# Patient Record
Sex: Female | Born: 1935 | Race: Black or African American | Hispanic: No | Smoking: Never smoker
Health system: Southern US, Community
[De-identification: ages and names within clinical notes are randomized; demographics above are authoritative.]

## PROBLEM LIST (undated history)

## (undated) DIAGNOSIS — E785 Hyperlipidemia, unspecified: Secondary | ICD-10-CM

## (undated) DIAGNOSIS — Z8639 Personal history of other endocrine, nutritional and metabolic disease: Secondary | ICD-10-CM

## (undated) DIAGNOSIS — Z803 Family history of malignant neoplasm of breast: Secondary | ICD-10-CM

## (undated) DIAGNOSIS — F329 Major depressive disorder, single episode, unspecified: Secondary | ICD-10-CM

## (undated) DIAGNOSIS — M810 Age-related osteoporosis without current pathological fracture: Secondary | ICD-10-CM

## (undated) DIAGNOSIS — N390 Urinary tract infection, site not specified: Secondary | ICD-10-CM

## (undated) DIAGNOSIS — IMO0002 Reserved for concepts with insufficient information to code with codable children: Secondary | ICD-10-CM

## (undated) DIAGNOSIS — M1990 Unspecified osteoarthritis, unspecified site: Secondary | ICD-10-CM

## (undated) DIAGNOSIS — R51 Headache: Secondary | ICD-10-CM

## (undated) DIAGNOSIS — K5790 Diverticulosis of intestine, part unspecified, without perforation or abscess without bleeding: Secondary | ICD-10-CM

## (undated) DIAGNOSIS — Z923 Personal history of irradiation: Secondary | ICD-10-CM

## (undated) DIAGNOSIS — F419 Anxiety disorder, unspecified: Secondary | ICD-10-CM

## (undated) DIAGNOSIS — I10 Essential (primary) hypertension: Secondary | ICD-10-CM

## (undated) DIAGNOSIS — G8929 Other chronic pain: Secondary | ICD-10-CM

## (undated) DIAGNOSIS — N8189 Other female genital prolapse: Secondary | ICD-10-CM

## (undated) DIAGNOSIS — Z8042 Family history of malignant neoplasm of prostate: Secondary | ICD-10-CM

## (undated) DIAGNOSIS — C50919 Malignant neoplasm of unspecified site of unspecified female breast: Secondary | ICD-10-CM

## (undated) DIAGNOSIS — Z800 Family history of malignant neoplasm of digestive organs: Secondary | ICD-10-CM

## (undated) DIAGNOSIS — R519 Headache, unspecified: Secondary | ICD-10-CM

## (undated) DIAGNOSIS — K219 Gastro-esophageal reflux disease without esophagitis: Secondary | ICD-10-CM

## (undated) DIAGNOSIS — F32A Depression, unspecified: Secondary | ICD-10-CM

## (undated) HISTORY — DX: Malignant neoplasm of unspecified site of unspecified female breast: C50.919

## (undated) HISTORY — DX: Major depressive disorder, single episode, unspecified: F32.9

## (undated) HISTORY — DX: Reserved for concepts with insufficient information to code with codable children: IMO0002

## (undated) HISTORY — PX: NECK SURGERY: SHX720

## (undated) HISTORY — DX: Other chronic pain: G89.29

## (undated) HISTORY — DX: Personal history of other endocrine, nutritional and metabolic disease: Z86.39

## (undated) HISTORY — DX: Urinary tract infection, site not specified: N39.0

## (undated) HISTORY — DX: Essential (primary) hypertension: I10

## (undated) HISTORY — DX: Hyperlipidemia, unspecified: E78.5

## (undated) HISTORY — DX: Age-related osteoporosis without current pathological fracture: M81.0

## (undated) HISTORY — DX: Anxiety disorder, unspecified: F41.9

## (undated) HISTORY — DX: Family history of malignant neoplasm of breast: Z80.3

## (undated) HISTORY — DX: Other female genital prolapse: N81.89

## (undated) HISTORY — DX: Unspecified osteoarthritis, unspecified site: M19.90

## (undated) HISTORY — DX: Family history of malignant neoplasm of prostate: Z80.42

## (undated) HISTORY — DX: Headache, unspecified: R51.9

## (undated) HISTORY — PX: BREAST LUMPECTOMY: SHX2

## (undated) HISTORY — DX: Headache: R51

## (undated) HISTORY — DX: Gastro-esophageal reflux disease without esophagitis: K21.9

## (undated) HISTORY — DX: Depression, unspecified: F32.A

## (undated) HISTORY — DX: Family history of malignant neoplasm of digestive organs: Z80.0

## (undated) HISTORY — DX: Diverticulosis of intestine, part unspecified, without perforation or abscess without bleeding: K57.90

---
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
## 1968-09-10 HISTORY — PX: APPENDECTOMY: SHX54

## 1996-09-10 HISTORY — PX: ABDOMINAL HYSTERECTOMY: SHX81

## 1998-02-22 ENCOUNTER — Other Ambulatory Visit: Admission: RE | Admit: 1998-02-22 | Discharge: 1998-02-22 | Payer: Self-pay | Admitting: Obstetrics and Gynecology

## 1998-02-28 ENCOUNTER — Ambulatory Visit (HOSPITAL_COMMUNITY): Admission: RE | Admit: 1998-02-28 | Discharge: 1998-02-28 | Payer: Self-pay | Admitting: Internal Medicine

## 1999-02-14 ENCOUNTER — Other Ambulatory Visit: Admission: RE | Admit: 1999-02-14 | Discharge: 1999-02-14 | Payer: Self-pay | Admitting: Obstetrics and Gynecology

## 2000-04-25 ENCOUNTER — Other Ambulatory Visit: Admission: RE | Admit: 2000-04-25 | Discharge: 2000-04-25 | Payer: Self-pay | Admitting: Obstetrics and Gynecology

## 2001-04-16 ENCOUNTER — Emergency Department (HOSPITAL_COMMUNITY): Admission: EM | Admit: 2001-04-16 | Discharge: 2001-04-16 | Payer: Self-pay | Admitting: Emergency Medicine

## 2001-04-16 ENCOUNTER — Encounter: Payer: Self-pay | Admitting: Emergency Medicine

## 2001-05-12 ENCOUNTER — Emergency Department (HOSPITAL_COMMUNITY): Admission: EM | Admit: 2001-05-12 | Discharge: 2001-05-13 | Payer: Self-pay | Admitting: Emergency Medicine

## 2001-05-13 ENCOUNTER — Encounter: Payer: Self-pay | Admitting: Emergency Medicine

## 2001-05-21 ENCOUNTER — Other Ambulatory Visit: Admission: RE | Admit: 2001-05-21 | Discharge: 2001-05-21 | Payer: Self-pay | Admitting: Obstetrics and Gynecology

## 2001-06-26 ENCOUNTER — Ambulatory Visit (HOSPITAL_COMMUNITY): Admission: RE | Admit: 2001-06-26 | Discharge: 2001-06-26 | Payer: Self-pay | Admitting: Gastroenterology

## 2002-03-04 ENCOUNTER — Other Ambulatory Visit: Admission: RE | Admit: 2002-03-04 | Discharge: 2002-03-04 | Payer: Self-pay | Admitting: Obstetrics and Gynecology

## 2002-05-19 ENCOUNTER — Encounter: Admission: RE | Admit: 2002-05-19 | Discharge: 2002-08-17 | Payer: Self-pay | Admitting: Internal Medicine

## 2002-11-09 ENCOUNTER — Other Ambulatory Visit: Admission: RE | Admit: 2002-11-09 | Discharge: 2002-11-09 | Payer: Self-pay | Admitting: Obstetrics and Gynecology

## 2005-10-19 ENCOUNTER — Encounter: Admission: RE | Admit: 2005-10-19 | Discharge: 2005-10-19 | Payer: Self-pay | Admitting: Orthopedic Surgery

## 2005-11-16 ENCOUNTER — Encounter: Admission: RE | Admit: 2005-11-16 | Discharge: 2005-11-16 | Payer: Self-pay | Admitting: Orthopedic Surgery

## 2007-09-11 DIAGNOSIS — Z923 Personal history of irradiation: Secondary | ICD-10-CM

## 2007-09-11 HISTORY — PX: BREAST SURGERY: SHX581

## 2007-09-11 HISTORY — DX: Personal history of irradiation: Z92.3

## 2007-09-11 HISTORY — PX: BREAST LUMPECTOMY: SHX2

## 2008-03-03 ENCOUNTER — Encounter: Payer: Self-pay | Admitting: Family Medicine

## 2008-03-31 ENCOUNTER — Observation Stay (HOSPITAL_COMMUNITY): Admission: EM | Admit: 2008-03-31 | Discharge: 2008-04-01 | Payer: Self-pay | Admitting: Emergency Medicine

## 2008-04-01 ENCOUNTER — Ambulatory Visit: Payer: Self-pay | Admitting: Surgery

## 2008-04-13 ENCOUNTER — Ambulatory Visit: Payer: Self-pay | Admitting: Family Medicine

## 2008-04-13 DIAGNOSIS — I10 Essential (primary) hypertension: Secondary | ICD-10-CM

## 2008-04-13 DIAGNOSIS — M1990 Unspecified osteoarthritis, unspecified site: Secondary | ICD-10-CM

## 2008-04-13 DIAGNOSIS — M069 Rheumatoid arthritis, unspecified: Secondary | ICD-10-CM | POA: Insufficient documentation

## 2008-04-13 LAB — CONVERTED CEMR LAB
Blood in Urine, dipstick: NEGATIVE
Glucose, Urine, Semiquant: NEGATIVE
Protein, U semiquant: NEGATIVE
Specific Gravity, Urine: <1.005

## 2008-04-14 ENCOUNTER — Encounter (INDEPENDENT_AMBULATORY_CARE_PROVIDER_SITE_OTHER): Payer: Self-pay | Admitting: Radiology

## 2008-04-14 LAB — CONVERTED CEMR LAB
Albumin: 3.8 g/dL (ref 3.5–5.2)
Alkaline Phosphatase: 75 units/L (ref 39–117)
Basophils Relative: 0.9 % (ref 0.0–3.0)
Bilirubin, Direct: 0.1 mg/dL (ref 0.0–0.3)
Chloride: 101 meq/L (ref 96–112)
Cholesterol: 197 mg/dL (ref 0–200)
Eosinophils Relative: 2.1 % (ref 0.0–5.0)
GFR calc non Af Amer: 65 mL/min
Hemoglobin: 13.3 g/dL (ref 12.0–15.0)
Lymphocytes Relative: 26.8 % (ref 12.0–46.0)
MCHC: 33.4 g/dL (ref 30.0–36.0)
Monocytes Relative: 9.1 % (ref 3.0–12.0)
Neutrophils Relative %: 61.1 % (ref 43.0–77.0)
Platelets: 238 10*3/uL (ref 150–400)
RDW: 13.9 % (ref 11.5–14.6)
Sodium: 139 meq/L (ref 135–145)
TSH: 0.83 microintl units/mL (ref 0.35–5.50)
Total Bilirubin: 1 mg/dL (ref 0.3–1.2)
Total Protein: 7.3 g/dL (ref 6.0–8.3)
Triglycerides: 116 mg/dL (ref 0–149)
VLDL: 23 mg/dL (ref 0–40)
WBC: 8.7 10*3/uL (ref 4.5–10.5)

## 2008-04-15 ENCOUNTER — Encounter (INDEPENDENT_AMBULATORY_CARE_PROVIDER_SITE_OTHER): Payer: Self-pay | Admitting: *Deleted

## 2008-04-15 ENCOUNTER — Telehealth (INDEPENDENT_AMBULATORY_CARE_PROVIDER_SITE_OTHER): Payer: Self-pay | Admitting: *Deleted

## 2008-04-22 ENCOUNTER — Telehealth: Payer: Self-pay | Admitting: Family Medicine

## 2008-04-22 DIAGNOSIS — C50919 Malignant neoplasm of unspecified site of unspecified female breast: Secondary | ICD-10-CM

## 2008-05-11 ENCOUNTER — Ambulatory Visit: Payer: Self-pay | Admitting: Family Medicine

## 2008-05-12 ENCOUNTER — Encounter: Admission: RE | Admit: 2008-05-12 | Discharge: 2008-05-12 | Payer: Self-pay | Admitting: General Surgery

## 2008-05-18 ENCOUNTER — Encounter (INDEPENDENT_AMBULATORY_CARE_PROVIDER_SITE_OTHER): Payer: Self-pay | Admitting: *Deleted

## 2008-05-24 ENCOUNTER — Encounter (HOSPITAL_BASED_OUTPATIENT_CLINIC_OR_DEPARTMENT_OTHER): Payer: Self-pay | Admitting: General Surgery

## 2008-05-24 ENCOUNTER — Ambulatory Visit (HOSPITAL_BASED_OUTPATIENT_CLINIC_OR_DEPARTMENT_OTHER): Admission: RE | Admit: 2008-05-24 | Discharge: 2008-05-24 | Payer: Self-pay | Admitting: General Surgery

## 2008-06-09 ENCOUNTER — Ambulatory Visit: Payer: Self-pay | Admitting: Oncology

## 2008-06-22 ENCOUNTER — Ambulatory Visit: Admission: RE | Admit: 2008-06-22 | Discharge: 2008-09-09 | Payer: Self-pay | Admitting: Radiation Oncology

## 2008-06-23 ENCOUNTER — Encounter: Payer: Self-pay | Admitting: Family Medicine

## 2008-06-23 LAB — COMPREHENSIVE METABOLIC PANEL
BUN: 13 mg/dL (ref 6–23)
CO2: 25 mEq/L (ref 19–32)
Calcium: 9.9 mg/dL (ref 8.4–10.5)
Chloride: 104 mEq/L (ref 96–112)
Creatinine, Ser: 0.87 mg/dL (ref 0.40–1.20)
Glucose, Bld: 98 mg/dL (ref 70–99)

## 2008-06-23 LAB — CBC WITH DIFFERENTIAL/PLATELET
Basophils Absolute: 0 10*3/uL (ref 0.0–0.1)
HCT: 39.4 % (ref 34.8–46.6)
HGB: 13 g/dL (ref 11.6–15.9)
MONO#: 0.7 10*3/uL (ref 0.1–0.9)
NEUT%: 64.3 % (ref 39.6–76.8)
Platelets: 197 10*3/uL (ref 145–400)
WBC: 8.2 10*3/uL (ref 3.9–10.0)
lymph#: 2.1 10*3/uL (ref 0.9–3.3)

## 2008-08-10 ENCOUNTER — Ambulatory Visit: Payer: Self-pay | Admitting: Oncology

## 2008-08-10 DIAGNOSIS — C50919 Malignant neoplasm of unspecified site of unspecified female breast: Secondary | ICD-10-CM

## 2008-08-10 HISTORY — DX: Malignant neoplasm of unspecified site of unspecified female breast: C50.919

## 2008-08-12 LAB — CBC WITH DIFFERENTIAL/PLATELET
BASO%: 0.9 % (ref 0.0–2.0)
Eosinophils Absolute: 0.2 10*3/uL (ref 0.0–0.5)
HCT: 39.5 % (ref 34.8–46.6)
MCHC: 33.3 g/dL (ref 32.0–36.0)
MONO#: 0.5 10*3/uL (ref 0.1–0.9)
NEUT#: 4.2 10*3/uL (ref 1.5–6.5)
NEUT%: 67.4 % (ref 39.6–76.8)
RBC: 4.46 10*6/uL (ref 3.70–5.32)
WBC: 6.3 10*3/uL (ref 3.9–10.0)
lymph#: 1.3 10*3/uL (ref 0.9–3.3)

## 2008-08-16 ENCOUNTER — Telehealth (INDEPENDENT_AMBULATORY_CARE_PROVIDER_SITE_OTHER): Payer: Self-pay | Admitting: *Deleted

## 2008-09-23 ENCOUNTER — Encounter: Payer: Self-pay | Admitting: Family Medicine

## 2008-11-08 ENCOUNTER — Ambulatory Visit: Payer: Self-pay | Admitting: Family Medicine

## 2008-11-08 DIAGNOSIS — E785 Hyperlipidemia, unspecified: Secondary | ICD-10-CM | POA: Insufficient documentation

## 2008-11-09 LAB — CONVERTED CEMR LAB
AST: 23 units/L (ref 0–37)
Alkaline Phosphatase: 64 units/L (ref 39–117)
BUN: 13 mg/dL (ref 6–23)
CO2: 30 meq/L (ref 19–32)
Chloride: 106 meq/L (ref 96–112)
Direct LDL: 142.5 mg/dL
GFR calc Af Amer: 78 mL/min
GFR calc non Af Amer: 64 mL/min
HDL: 52.9 mg/dL (ref >39.0)
Sodium: 142 meq/L (ref 135–145)
Total Bilirubin: 0.8 mg/dL (ref 0.3–1.2)
Vit D, 25-Hydroxy: 56 ng/mL (ref 30–89)

## 2008-11-10 ENCOUNTER — Encounter (INDEPENDENT_AMBULATORY_CARE_PROVIDER_SITE_OTHER): Payer: Self-pay | Admitting: *Deleted

## 2008-11-11 ENCOUNTER — Encounter: Admission: RE | Admit: 2008-11-11 | Discharge: 2008-11-11 | Payer: Self-pay | Admitting: General Surgery

## 2008-12-21 ENCOUNTER — Ambulatory Visit (HOSPITAL_BASED_OUTPATIENT_CLINIC_OR_DEPARTMENT_OTHER): Admission: RE | Admit: 2008-12-21 | Discharge: 2008-12-21 | Payer: Self-pay | Admitting: Family Medicine

## 2008-12-21 ENCOUNTER — Ambulatory Visit: Payer: Self-pay | Admitting: Diagnostic Radiology

## 2008-12-27 ENCOUNTER — Ambulatory Visit (HOSPITAL_COMMUNITY): Admission: RE | Admit: 2008-12-27 | Discharge: 2008-12-27 | Payer: Self-pay | Admitting: Orthopedic Surgery

## 2009-01-03 ENCOUNTER — Ambulatory Visit: Payer: Self-pay | Admitting: Oncology

## 2009-01-05 LAB — CBC WITH DIFFERENTIAL/PLATELET
BASO%: 0.5 % (ref 0.0–2.0)
EOS%: 1.6 % (ref 0.0–7.0)
LYMPH%: 27.6 % (ref 14.0–49.7)
MCH: 30.5 pg (ref 25.1–34.0)
MCHC: 33.3 g/dL (ref 31.5–36.0)
MCV: 91.7 fL (ref 79.5–101.0)
MONO%: 8.8 % (ref 0.0–14.0)
Platelets: 196 10*3/uL (ref 145–400)
RBC: 4.28 10*6/uL (ref 3.70–5.45)

## 2009-01-05 LAB — COMPREHENSIVE METABOLIC PANEL
ALT: 14 U/L (ref 0–35)
Alkaline Phosphatase: 77 U/L (ref 39–117)
Glucose, Bld: 97 mg/dL (ref 70–99)
Sodium: 139 mEq/L (ref 135–145)
Total Bilirubin: 0.6 mg/dL (ref 0.3–1.2)
Total Protein: 7.1 g/dL (ref 6.0–8.3)

## 2009-01-06 ENCOUNTER — Encounter: Admission: RE | Admit: 2009-01-06 | Discharge: 2009-01-06 | Payer: Self-pay | Admitting: General Surgery

## 2009-02-09 ENCOUNTER — Ambulatory Visit (HOSPITAL_COMMUNITY): Admission: RE | Admit: 2009-02-09 | Discharge: 2009-02-09 | Payer: Self-pay | Admitting: Obstetrics and Gynecology

## 2009-04-01 ENCOUNTER — Ambulatory Visit: Payer: Self-pay | Admitting: Oncology

## 2009-04-19 LAB — COMPREHENSIVE METABOLIC PANEL
ALT: 17 U/L (ref 0–35)
Alkaline Phosphatase: 45 U/L (ref 39–117)
Sodium: 136 mEq/L (ref 135–145)
Total Bilirubin: 0.7 mg/dL (ref 0.3–1.2)
Total Protein: 6.7 g/dL (ref 6.0–8.3)

## 2009-04-19 LAB — CBC WITH DIFFERENTIAL/PLATELET
BASO%: 0.4 % (ref 0.0–2.0)
LYMPH%: 26.8 % (ref 14.0–49.7)
MCHC: 33.7 g/dL (ref 31.5–36.0)
MCV: 93.6 fL (ref 79.5–101.0)
MONO#: 0.6 10*3/uL (ref 0.1–0.9)
MONO%: 10.5 % (ref 0.0–14.0)
Platelets: 173 10*3/uL (ref 145–400)
RBC: 3.94 10*6/uL (ref 3.70–5.45)
RDW: 15 % — ABNORMAL HIGH (ref 11.2–14.5)
WBC: 5.4 10*3/uL (ref 3.9–10.3)

## 2009-05-05 ENCOUNTER — Ambulatory Visit: Payer: Self-pay | Admitting: Family Medicine

## 2009-05-05 LAB — CONVERTED CEMR LAB
Glucose, Urine, Semiquant: NEGATIVE
Ketones, urine, test strip: NEGATIVE
Urobilinogen, UA: NEGATIVE
pH: 7.5

## 2009-05-07 LAB — CONVERTED CEMR LAB
ALT: 18 units/L (ref 0–35)
Albumin: 3.8 g/dL (ref 3.5–5.2)
Alkaline Phosphatase: 45 units/L (ref 39–117)
Basophils Absolute: 0 10*3/uL (ref 0.0–0.1)
Basophils Relative: 0 % (ref 0.0–3.0)
Bilirubin, Direct: 0 mg/dL (ref 0.0–0.3)
Calcium: 9.2 mg/dL (ref 8.4–10.5)
Cholesterol: 185 mg/dL (ref 0–200)
Creatinine, Ser: 0.9 mg/dL (ref 0.4–1.2)
Folate: >20 ng/mL
HCT: 39 % (ref 36.0–46.0)
Hemoglobin: 13.2 g/dL (ref 12.0–15.0)
LDL Cholesterol: 125 mg/dL — ABNORMAL HIGH (ref 0–99)
Lymphocytes Relative: 29.6 % (ref 12.0–46.0)
Lymphs Abs: 1.9 10*3/uL (ref 0.7–4.0)
Neutro Abs: 3.9 10*3/uL (ref 1.4–7.7)
Platelets: 177 10*3/uL (ref 150.0–400.0)
Potassium: 3.7 meq/L (ref 3.5–5.1)
RBC: 4.1 M/uL (ref 3.87–5.11)
Sodium: 140 meq/L (ref 135–145)
TSH: 1.2 microintl units/mL (ref 0.35–5.50)
Total Bilirubin: 0.9 mg/dL (ref 0.3–1.2)
Total Protein: 7.4 g/dL (ref 6.0–8.3)
Triglycerides: 75 mg/dL (ref 0.0–149.0)
VLDL: 15 mg/dL (ref 0.0–40.0)
Vitamin B-12: 822 pg/mL (ref 211–911)

## 2009-05-10 ENCOUNTER — Encounter (INDEPENDENT_AMBULATORY_CARE_PROVIDER_SITE_OTHER): Payer: Self-pay | Admitting: *Deleted

## 2009-06-21 ENCOUNTER — Ambulatory Visit: Payer: Self-pay | Admitting: Oncology

## 2009-07-01 ENCOUNTER — Encounter: Payer: Self-pay | Admitting: Family Medicine

## 2009-07-01 LAB — CBC WITH DIFFERENTIAL/PLATELET
Basophils Absolute: 0 10*3/uL (ref 0.0–0.1)
EOS%: 3 % (ref 0.0–7.0)
HCT: 38.2 % (ref 34.8–46.6)
HGB: 12.5 g/dL (ref 11.6–15.9)
MCH: 30 pg (ref 25.1–34.0)
MCV: 91.6 fL (ref 79.5–101.0)
MONO%: 6.8 % (ref 0.0–14.0)
NEUT%: 57.1 % (ref 38.4–76.8)
Platelets: 174 10*3/uL (ref 145–400)

## 2009-07-01 LAB — COMPREHENSIVE METABOLIC PANEL
ALT: 12 U/L (ref 0–35)
Albumin: 3.8 g/dL (ref 3.5–5.2)
BUN: 10 mg/dL (ref 6–23)
Glucose, Bld: 64 mg/dL — ABNORMAL LOW (ref 70–99)
Total Bilirubin: 0.6 mg/dL (ref 0.3–1.2)
Total Protein: 7 g/dL (ref 6.0–8.3)

## 2009-07-07 ENCOUNTER — Encounter: Admission: RE | Admit: 2009-07-07 | Discharge: 2009-07-07 | Payer: Self-pay | Admitting: Oncology

## 2009-08-10 ENCOUNTER — Ambulatory Visit: Payer: Self-pay | Admitting: Family Medicine

## 2009-08-24 IMAGING — CR DG CHEST 2V
2 series · 2 of 2 positions shown · non-contrast
Comparison: Chest x-ray of 10/19/2005

CLINICAL DATA: Shoulder pain, hypertension

CHEST - 2 VIEW

[w chest pa]
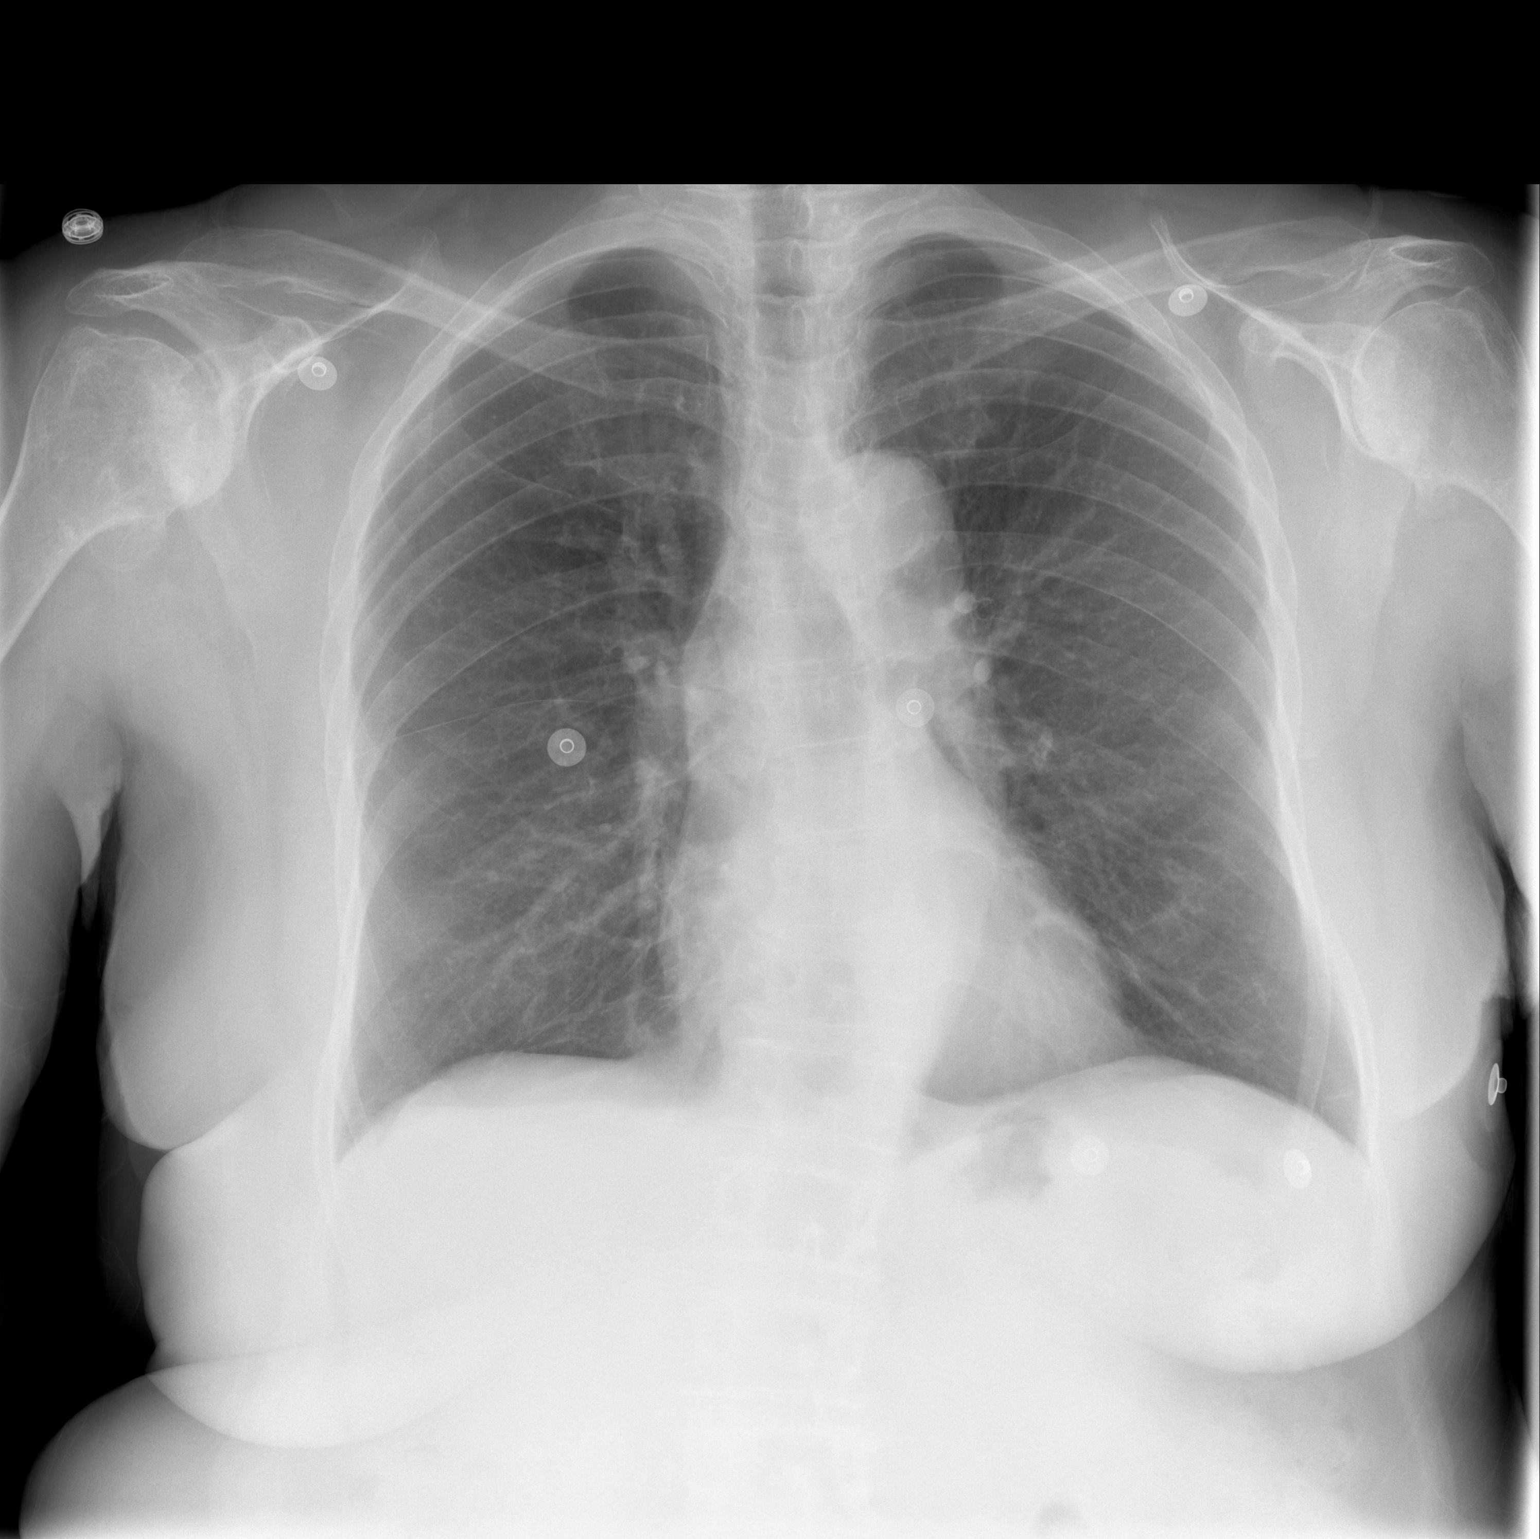

[w chest lat]
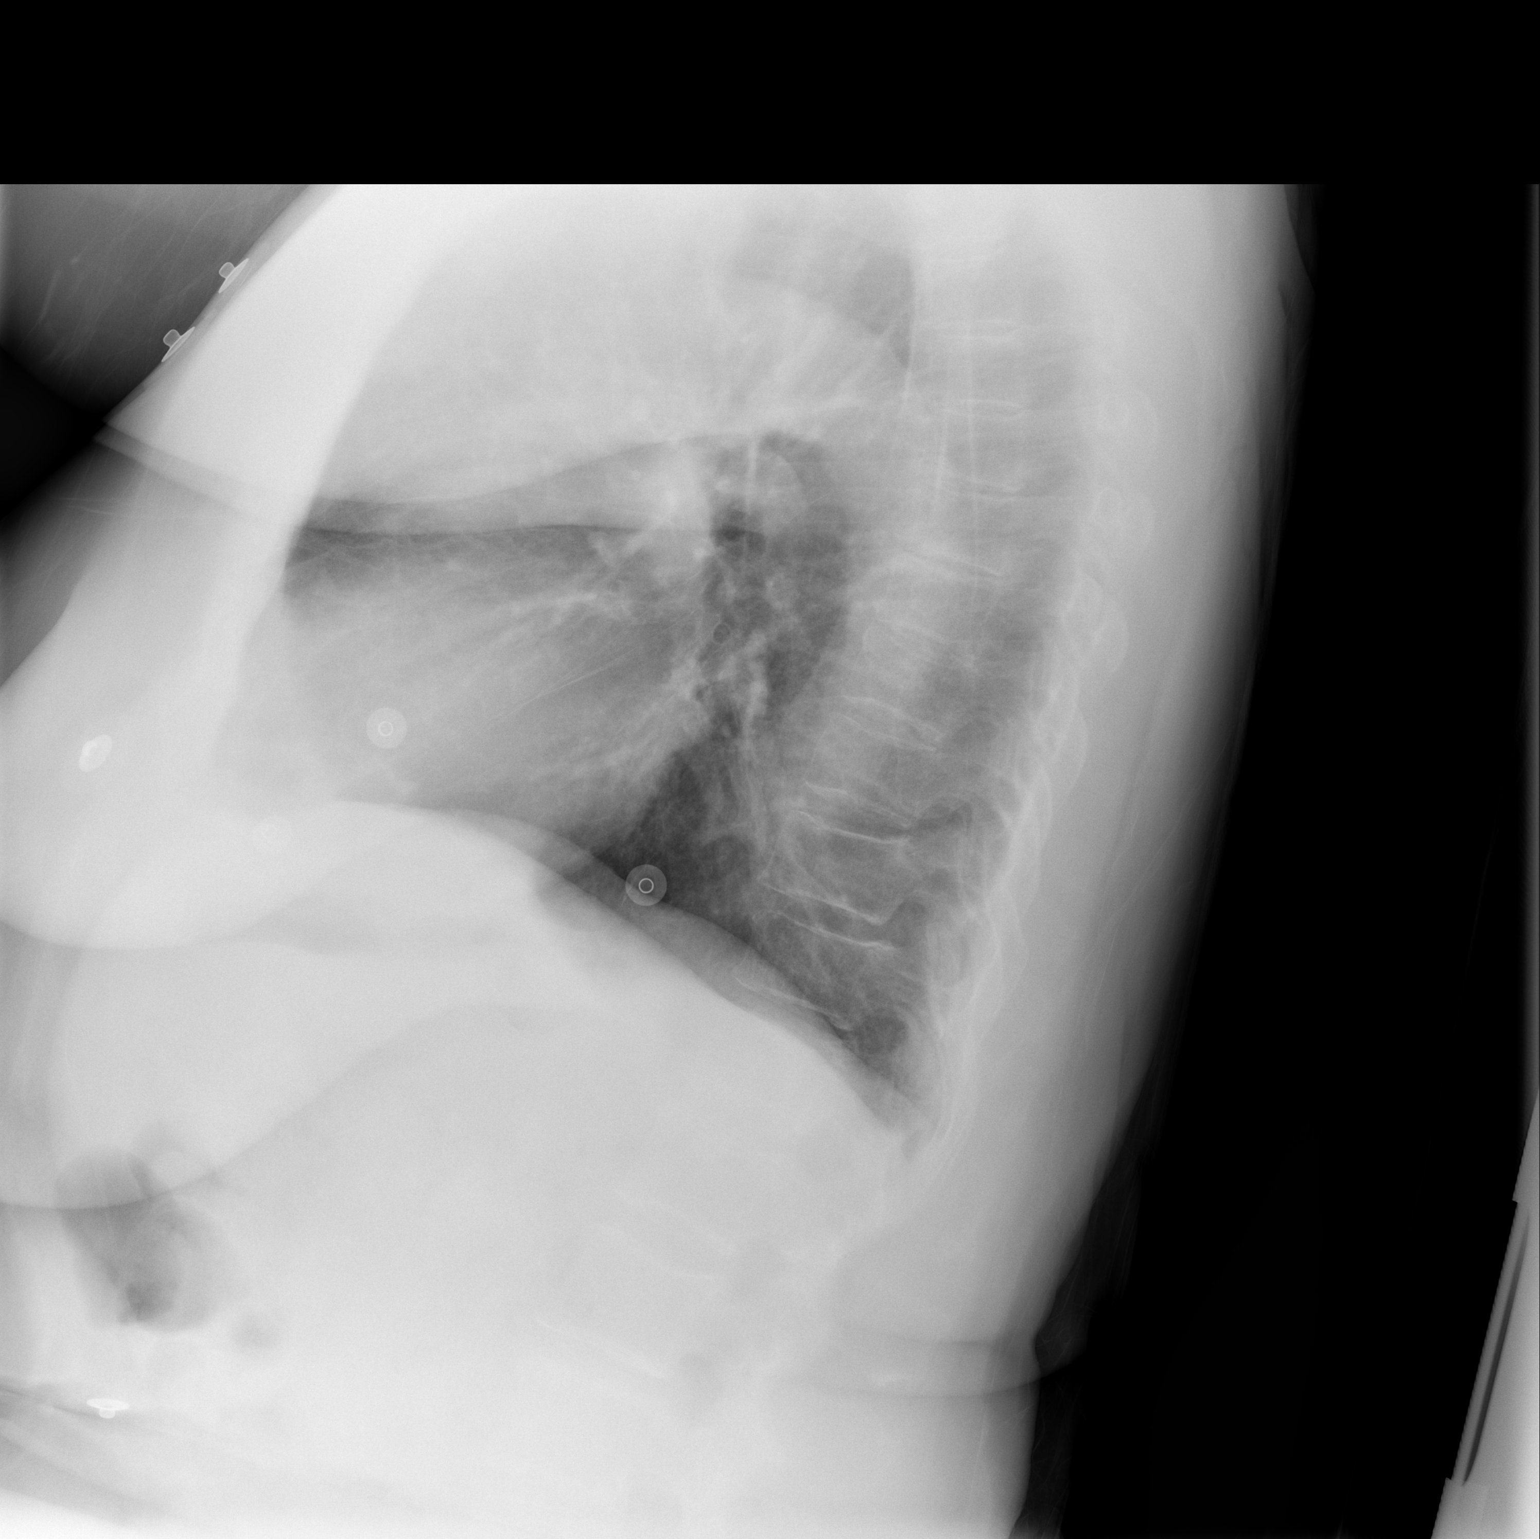

[2 of 2 positions shown; findings below may reference images not displayed]

FINDINGS: The lungs are clear.  Heart is within normal limits in
size.  There is moderate bilateral glenohumeral degenerative joint
disease present.  No acute bony abnormality is seen.
IMPRESSION: No active lung disease.  Bilateral degenerative joint disease
involves both shoulders.

## 2009-08-24 IMAGING — CR DG SHOULDER 2+V*R*
4 series · 4 of 4 positions shown · non-contrast
Comparison: Chest radiograph 10/19/2005 and chest CT 10/19/2005.

CLINICAL DATA: Shoulder pain

RIGHT SHOULDER - 2+ VIEW

[w shoulder ap internal righ]
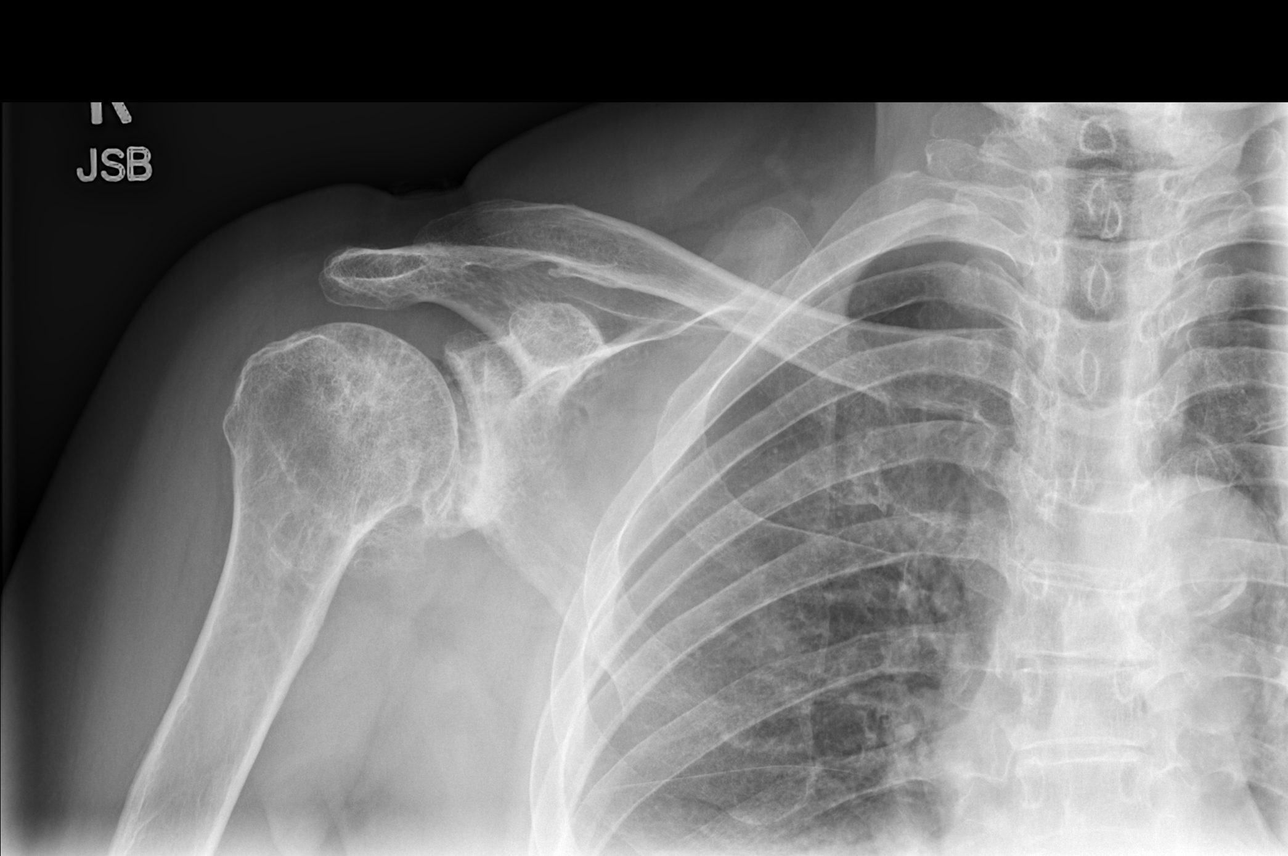

[w shoulder ap external righ]
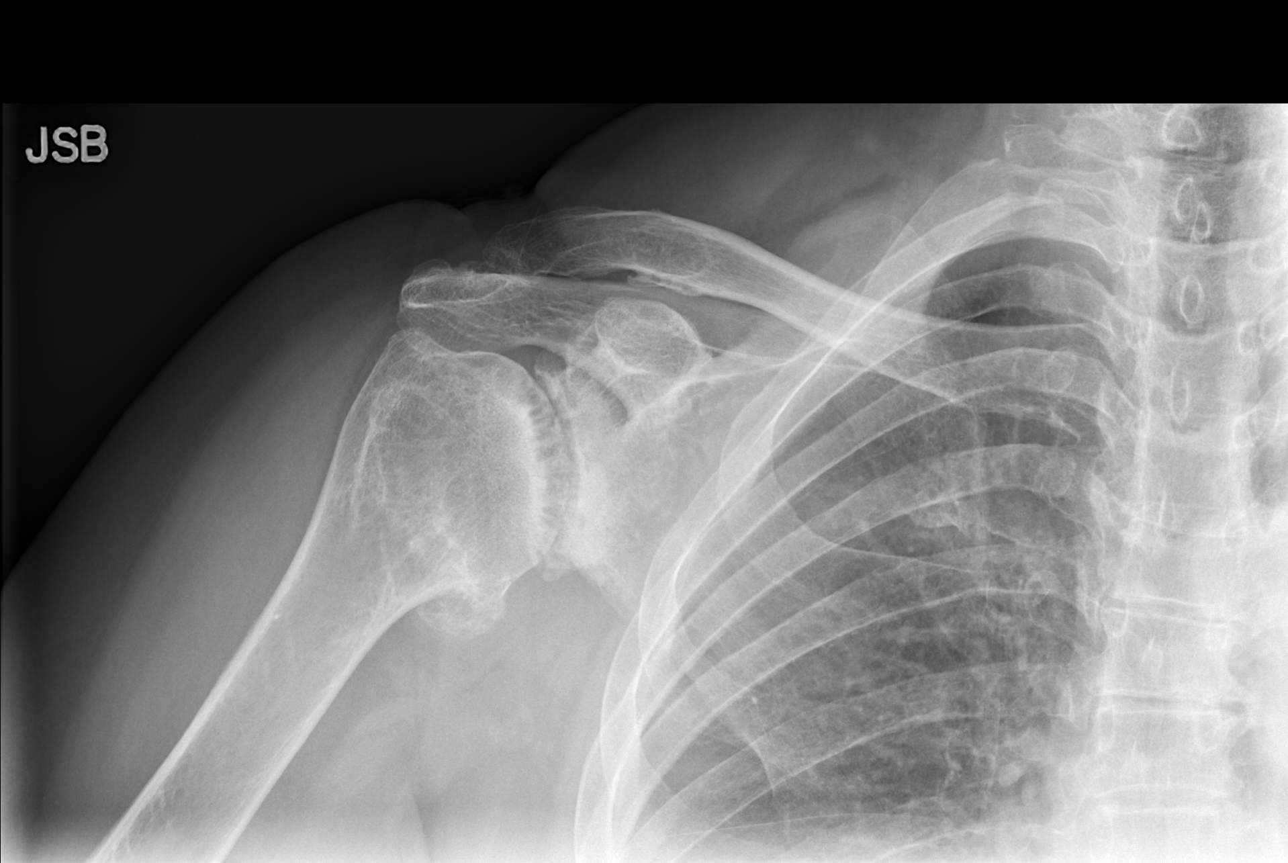

[w shoulder y view right (1 of 2)]
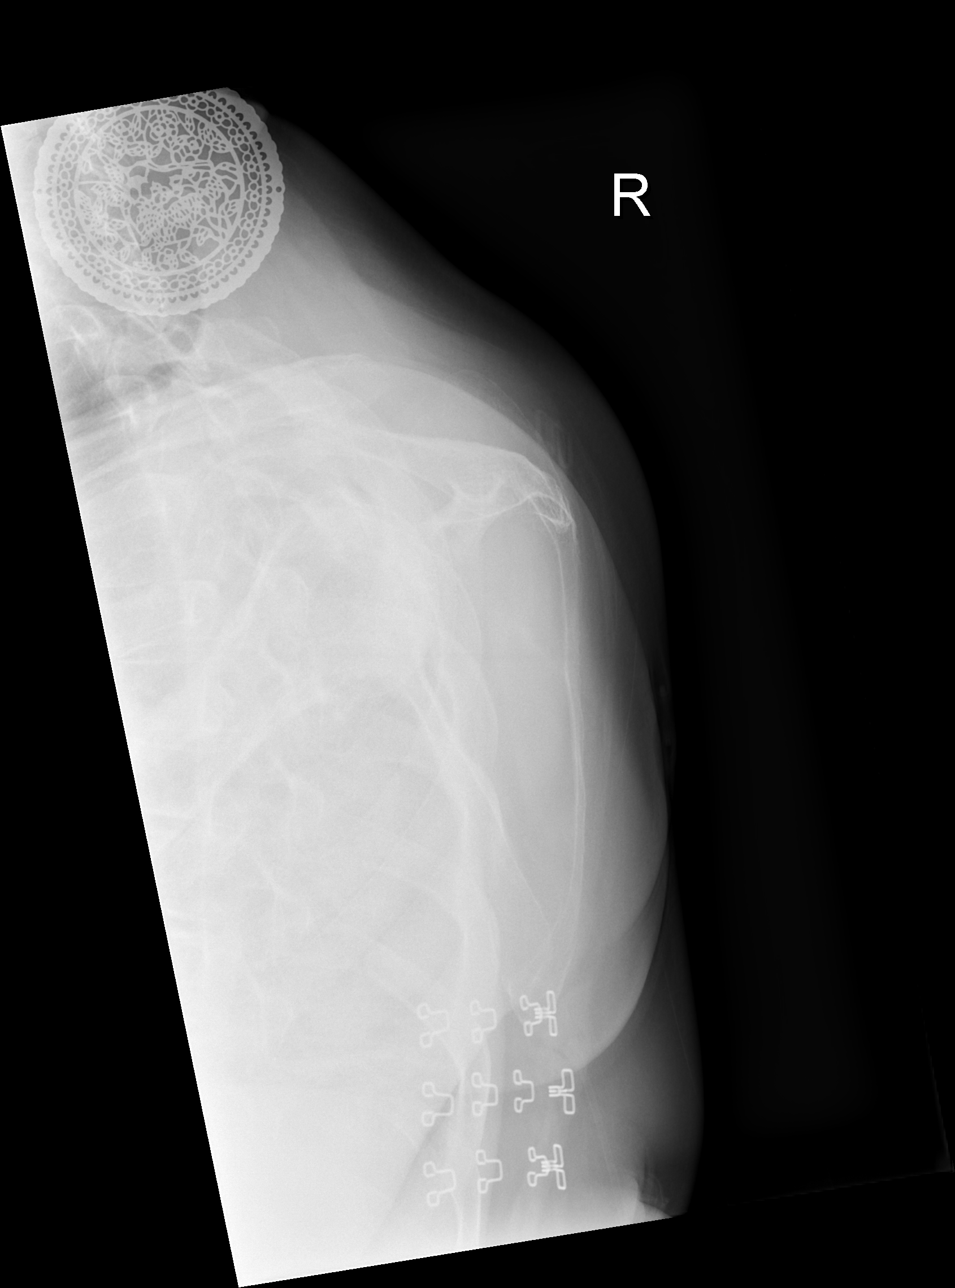

[w shoulder y view right (2 of 2)]
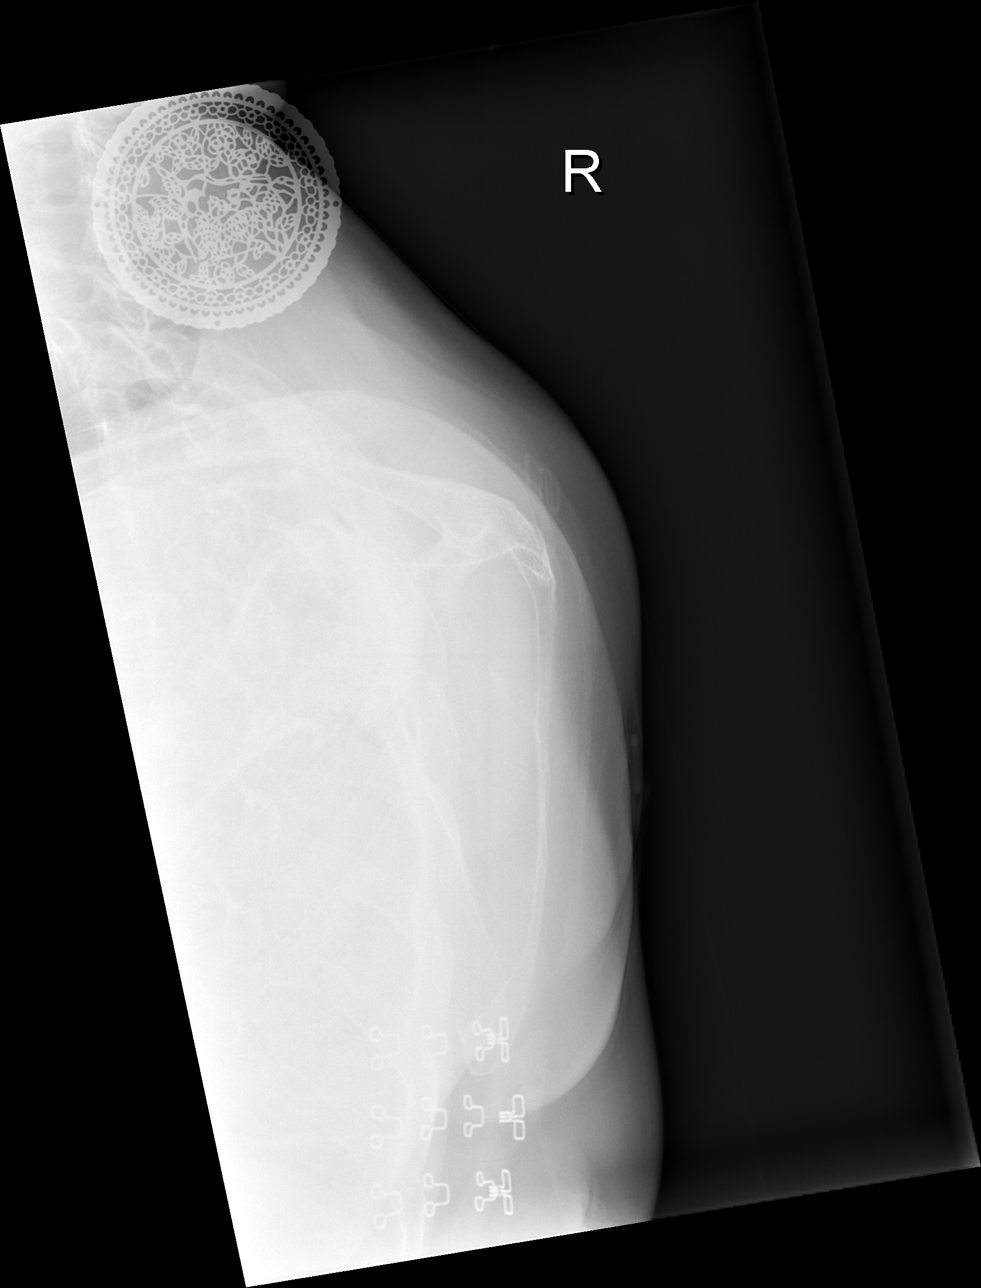

[4 of 4 positions shown; findings below may reference images not displayed]

FINDINGS: On the transscapular Y views, the humeral head projects over the
glenoid fossa.  No evidence for dislocation.

There are marked degenerative changes of the shoulder joint,
including joint space narrowing,  osteophyte formation, and
subchondral cyst formation and subchondral sclerosis. The
degenerative changes involve both the humeral head and the glenoid.
There is mild degenerative change of the acromioclavicular joint.
No acute fracture is identified.
IMPRESSION: Advanced degenerative changes of the right shoulder.  No fracture
or dislocation identified.

## 2009-09-27 ENCOUNTER — Ambulatory Visit: Payer: Self-pay | Admitting: Family Medicine

## 2009-09-27 DIAGNOSIS — J019 Acute sinusitis, unspecified: Secondary | ICD-10-CM

## 2009-10-06 ENCOUNTER — Ambulatory Visit: Admission: RE | Admit: 2009-10-06 | Discharge: 2009-10-06 | Payer: Self-pay | Admitting: Radiation Oncology

## 2009-10-06 ENCOUNTER — Encounter: Payer: Self-pay | Admitting: Family Medicine

## 2009-10-31 ENCOUNTER — Ambulatory Visit: Payer: Self-pay | Admitting: Oncology

## 2009-11-02 ENCOUNTER — Encounter: Payer: Self-pay | Admitting: Family Medicine

## 2009-11-02 LAB — CBC WITH DIFFERENTIAL/PLATELET
Eosinophils Absolute: 0.1 10*3/uL (ref 0.0–0.5)
LYMPH%: 22.9 % (ref 14.0–49.7)
MCHC: 33.5 g/dL (ref 31.5–36.0)
MCV: 93.5 fL (ref 79.5–101.0)
MONO%: 8.9 % (ref 0.0–14.0)
NEUT#: 4.4 10*3/uL (ref 1.5–6.5)
NEUT%: 66.3 % (ref 38.4–76.8)
Platelets: 183 10*3/uL (ref 145–400)
RBC: 3.92 10*6/uL (ref 3.70–5.45)

## 2009-11-02 LAB — COMPREHENSIVE METABOLIC PANEL
Alkaline Phosphatase: 42 U/L (ref 39–117)
Creatinine, Ser: 0.86 mg/dL (ref 0.40–1.20)
Glucose, Bld: 64 mg/dL — ABNORMAL LOW (ref 70–99)
Sodium: 141 mEq/L (ref 135–145)
Total Bilirubin: 0.6 mg/dL (ref 0.3–1.2)
Total Protein: 7.1 g/dL (ref 6.0–8.3)

## 2010-02-08 ENCOUNTER — Ambulatory Visit: Payer: Self-pay | Admitting: Oncology

## 2010-02-09 ENCOUNTER — Encounter: Payer: Self-pay | Admitting: Family Medicine

## 2010-02-09 LAB — CBC WITH DIFFERENTIAL/PLATELET
BASO%: 0.5 % (ref 0.0–2.0)
LYMPH%: 26.2 % (ref 14.0–49.7)
MCHC: 34.1 g/dL (ref 31.5–36.0)
MCV: 92.8 fL (ref 79.5–101.0)
MONO%: 8.9 % (ref 0.0–14.0)
Platelets: 181 10*3/uL (ref 145–400)
RBC: 4 10*6/uL (ref 3.70–5.45)

## 2010-02-09 LAB — COMPREHENSIVE METABOLIC PANEL
ALT: 11 U/L (ref 0–35)
Alkaline Phosphatase: 43 U/L (ref 39–117)
Sodium: 142 mEq/L (ref 135–145)
Total Bilirubin: 0.4 mg/dL (ref 0.3–1.2)
Total Protein: 6.8 g/dL (ref 6.0–8.3)

## 2010-04-06 ENCOUNTER — Encounter: Payer: Self-pay | Admitting: Family Medicine

## 2010-04-29 ENCOUNTER — Encounter: Payer: Self-pay | Admitting: Family Medicine

## 2010-05-16 ENCOUNTER — Ambulatory Visit: Payer: Self-pay | Admitting: Family Medicine

## 2010-05-17 ENCOUNTER — Encounter: Payer: Self-pay | Admitting: Family Medicine

## 2010-05-17 LAB — CONVERTED CEMR LAB
ALT: 20 units/L (ref 0–35)
AST: 28 units/L (ref 0–37)
Alkaline Phosphatase: 39 units/L (ref 39–117)
BUN: 15 mg/dL (ref 6–23)
Basophils Absolute: 0 10*3/uL (ref 0.0–0.1)
Basophils Relative: 0.4 % (ref 0.0–3.0)
Bilirubin, Direct: 0.1 mg/dL (ref 0.0–0.3)
CO2: 28 meq/L (ref 19–32)
Calcium: 8.8 mg/dL (ref 8.4–10.5)
Chloride: 108 meq/L (ref 96–112)
GFR calc non Af Amer: 94.23 mL/min (ref >60)
HCT: 36.8 % (ref 36.0–46.0)
HDL: 53.2 mg/dL (ref >39.00)
Monocytes Absolute: 0.6 10*3/uL (ref 0.1–1.0)
Neutro Abs: 4.5 10*3/uL (ref 1.4–7.7)
Sodium: 142 meq/L (ref 135–145)
Total Bilirubin: 0.6 mg/dL (ref 0.3–1.2)
Total CHOL/HDL Ratio: 3
Total Protein: 6.3 g/dL (ref 6.0–8.3)
Vit D, 25-Hydroxy: 43 ng/mL (ref 30–89)
WBC: 7.7 10*3/uL (ref 4.5–10.5)

## 2010-05-22 ENCOUNTER — Ambulatory Visit: Payer: Self-pay | Admitting: Oncology

## 2010-05-24 ENCOUNTER — Encounter: Admission: RE | Admit: 2010-05-24 | Discharge: 2010-05-24 | Payer: Self-pay | Admitting: Orthopedic Surgery

## 2010-05-31 ENCOUNTER — Encounter: Payer: Self-pay | Admitting: Family Medicine

## 2010-05-31 LAB — CBC WITH DIFFERENTIAL/PLATELET
BASO%: 0.3 % (ref 0.0–2.0)
LYMPH%: 32.1 % (ref 14.0–49.7)
MCHC: 33.9 g/dL (ref 31.5–36.0)
MONO#: 0.6 10*3/uL (ref 0.1–0.9)
Platelets: 191 10*3/uL (ref 145–400)
RBC: 4.04 10*6/uL (ref 3.70–5.45)
RDW: 15.1 % — ABNORMAL HIGH (ref 11.2–14.5)
WBC: 6.3 10*3/uL (ref 3.9–10.3)

## 2010-05-31 LAB — COMPREHENSIVE METABOLIC PANEL
ALT: 14 U/L (ref 0–35)
Alkaline Phosphatase: 44 U/L (ref 39–117)
CO2: 22 mEq/L (ref 19–32)
Sodium: 140 mEq/L (ref 135–145)
Total Bilirubin: 0.3 mg/dL (ref 0.3–1.2)
Total Protein: 6.5 g/dL (ref 6.0–8.3)

## 2010-07-25 ENCOUNTER — Ambulatory Visit: Payer: Self-pay | Admitting: Family Medicine

## 2010-07-31 ENCOUNTER — Encounter: Admission: RE | Admit: 2010-07-31 | Discharge: 2010-07-31 | Payer: Self-pay | Admitting: Obstetrics and Gynecology

## 2010-07-31 LAB — HM MAMMOGRAPHY

## 2010-09-25 ENCOUNTER — Ambulatory Visit: Payer: Self-pay | Admitting: Oncology

## 2010-09-27 ENCOUNTER — Encounter: Payer: Self-pay | Admitting: Family Medicine

## 2010-09-27 LAB — COMPREHENSIVE METABOLIC PANEL
ALT: 11 U/L (ref 0–35)
AST: 19 U/L (ref 0–37)
Albumin: 3.9 g/dL (ref 3.5–5.2)
Alkaline Phosphatase: 40 U/L (ref 39–117)
BUN: 15 mg/dL (ref 6–23)
CO2: 25 mEq/L (ref 19–32)
Calcium: 9.7 mg/dL (ref 8.4–10.5)
Chloride: 107 mEq/L (ref 96–112)
Creatinine, Ser: 0.92 mg/dL (ref 0.40–1.20)
Glucose, Bld: 74 mg/dL (ref 70–99)
Potassium: 3.9 mEq/L (ref 3.5–5.3)
Sodium: 141 mEq/L (ref 135–145)
Total Bilirubin: 0.4 mg/dL (ref 0.3–1.2)
Total Protein: 6.8 g/dL (ref 6.0–8.3)

## 2010-09-27 LAB — CBC WITH DIFFERENTIAL/PLATELET
BASO%: 0.5 % (ref 0.0–2.0)
Basophils Absolute: 0 10*3/uL (ref 0.0–0.1)
EOS%: 1.7 % (ref 0.0–7.0)
Eosinophils Absolute: 0.1 10*3/uL (ref 0.0–0.5)
HCT: 37.1 % (ref 34.8–46.6)
HGB: 12.6 g/dL (ref 11.6–15.9)
LYMPH%: 34.2 % (ref 14.0–49.7)
MCH: 30 pg (ref 25.1–34.0)
MCHC: 34 g/dL (ref 31.5–36.0)
MCV: 88.3 fL (ref 79.5–101.0)
MONO#: 0.6 10*3/uL (ref 0.1–0.9)
MONO%: 9.2 % (ref 0.0–14.0)
NEUT#: 3.6 10*3/uL (ref 1.5–6.5)
NEUT%: 54.4 % (ref 38.4–76.8)
Platelets: 176 10*3/uL (ref 145–400)
RBC: 4.2 10*6/uL (ref 3.70–5.45)
RDW: 14.9 % — ABNORMAL HIGH (ref 11.2–14.5)
WBC: 6.6 10*3/uL (ref 3.9–10.3)
lymph#: 2.3 10*3/uL (ref 0.9–3.3)
nRBC: 0 % (ref 0–0)

## 2010-10-10 NOTE — Letter (Signed)
[facility]   Imported By: Lanelle Bal 05/09/2010 09:44:40  _____________________________________________________________________  External Attachment:    Type:   Image     Comment:   External Document

## 2010-10-10 NOTE — Letter (Signed)
[facility]   Imported By: Lanelle Bal 10/27/2009 11:34:23  _____________________________________________________________________  External Attachment:    Type:   Image     Comment:   External Document

## 2010-10-10 NOTE — Letter (Signed)
[facility]   Imported By: Lanelle Bal 11/18/2009 10:10:08  _____________________________________________________________________  External Attachment:    Type:   Image     Comment:   External Document

## 2010-10-10 NOTE — Letter (Signed)
[facility]   Imported By: Lanelle Bal 02/25/2010 11:04:06  _____________________________________________________________________  External Attachment:    Type:   Image     Comment:   External Document

## 2010-10-10 NOTE — Assessment & Plan Note (Signed)
Summary: COLD,HEADACHE//PH   Vital Signs:  Patient profile:   77 year old female Weight:      178 pounds Temp:     98.1 degrees F oral Pulse rate:   88 / minute Pulse rhythm:   regular BP sitting:   138 / 78  (left arm) Cuff size:   regular  Vitals Entered By: Army Fossa CMA (September 27, 2009 1:02 PM) CC: Pt c/o cough, head congestion, headache, eyes watering. , URI symptoms   History of Present Illness:       This is a 77 year old woman who presents with URI symptoms.  The symptoms began 6 days ago.  The patient complains of nasal congestion, purulent nasal discharge, sore throat, and productive cough, but denies clear nasal discharge, dry cough, earache, and sick contacts.  The patient denies fever, low-grade fever (<100.5 degrees), fever of 100.5-103 degrees, fever of 103.1-104 degrees, fever to >104 degrees, stiff neck, dyspnea, wheezing, rash, vomiting, diarrhea, use of an antipyretic, and response to antipyretic.  The patient also reports headache.  The patient denies itchy watery eyes, itchy throat, sneezing, seasonal symptoms, response to antihistamine, muscle aches, and severe fatigue.  The patient denies the following risk factors for Strep sinusitis: unilateral facial pain, unilateral nasal discharge, poor response to decongestant, double sickening, tooth pain, Strep exposure, tender adenopathy, and absence of cough.    Allergies: 1)  ! Relafen 2)  ! Erythromycin 3)  ! Celebrex  Physical Exam  General:  Well-developed,well-nourished,in no acute distress; alert,appropriate and cooperative throughout examination Ears:  External ear exam shows no significant lesions or deformities.  Otoscopic examination reveals clear canals, tympanic membranes are intact bilaterally without bulging, retraction, inflammation or discharge. Hearing is grossly normal bilaterally. Nose:  L frontal sinus tenderness, L maxillary sinus tenderness, R frontal sinus tenderness, and R maxillary sinus  tenderness.   Mouth:  no exudates and pharyngeal erythema.   Neck:  No deformities, masses, or tenderness noted. Lungs:  Normal respiratory effort, chest expands symmetrically. Lungs are clear to auscultation, no crackles or wheezes. Psych:  Oriented X3 and normally interactive.     Impression & Recommendations:  Problem # 1:  SINUSITIS - ACUTE-NOS (ICD-461.9)  Her updated medication list for this problem includes:    Augmentin 875-125 Mg Tabs (Amoxicillin-pot clavulanate) .Marland Kitchen... 1 by mouth two times a day    Nasonex 50 Mcg/act Susp (Mometasone furoate) .Marland Kitchen... 2 sprays each nostril once daily  Instructed on treatment. Call if symptoms persist or worsen.   Complete Medication List: 1)  Norvasc 5 Mg Tabs (Amlodipine besylate) .Marland Kitchen.. 1 by mouth once daily 2)  D3-50 50000 Unit Caps (Cholecalciferol) .... Take 1 tab once daily 3)  Tamoxifen Citrate 20 Mg Tabs (Tamoxifen citrate) .... Take 1 tab once daily 4)  Hair/skin/nails Tabs (Multiple vitamins-minerals) .... Take 1 tab once daily 5)  Womens One Daily Tabs (Multiple vitamins-minerals) .... Take 1 tab once daily 6)  Red Yeast Rice 600 Mg Tabs (Red yeast rice extract) .... Take 2 tab at night 7)  Fish Oil 1200 Mg Caps (Omega-3 fatty acids) .... Take 1 tab once daily 8)  Zostavax 40102 Unt/0.79ml Solr (Zoster vaccine live) .Marland Kitchen.. 1 ml im x1 9)  Augmentin 875-125 Mg Tabs (Amoxicillin-pot clavulanate) .Marland Kitchen.. 1 by mouth two times a day 10)  Nasonex 50 Mcg/act Susp (Mometasone furoate) .... 2 sprays each nostril once daily Prescriptions: AUGMENTIN 875-125 MG TABS (AMOXICILLIN-POT CLAVULANATE) 1 by mouth two times a day  #20 x 0  Entered and Authorized by:   Loreen Freud DO   Signed by:   Loreen Freud DO on 09/27/2009   Method used:   Electronically to        [facility] [phone]* (retail)       [address]       [place], [place]  [address]       Ph: [phone]       Fax: [phone]   RxID:    [id number]

## 2010-10-10 NOTE — Assessment & Plan Note (Signed)
Summary: cpx/cbs   Vital Signs:  Patient profile:   77 year old female Height:      62 inches Weight:      182.6 pounds BMI:     33.52 Temp:     98.2 degrees F oral Pulse rate:   88 / minute Pulse rhythm:   regular BP sitting:   140 / 80  (right arm)  Vitals Entered By: Almeta Monas CMA Duncan Dull) (May 16, 2010 10:21 AM) CC: cpx/fasting  Does patient need assistance? Functional Status Self care, Cook/clean, Shopping, Social activities Ambulation Normal Comments pt is able to do all adls and can read and writs  Vision Screening:      Vision Comments: ophtho--q1y 40db HL: Left  Right  Audiometry Comment: grossly normal    History of Present Illness: Pt here for cpe and labs.  Pt seeing Dr Darrelyn Hillock for her back and pt has had good reports from cancer center.   No other complaints.    Preventive Screening-Counseling & Management  Alcohol-Tobacco     Alcohol drinks/day: 0     Smoking Status: never     Passive Smoke Exposure: no  Caffeine-Diet-Exercise     Caffeine use/day: 0     Does Patient Exercise: yes     Type of exercise: water aerobic     Exercise (avg: min/session): 30-60     Times/week: 3     Exercise Counseling: not indicated; exercise is adequate  Hep-HIV-STD-Contraception     HIV Risk: no     Dental Visit-last 6 months yes     Dental Care Counseling: not indicated; dental care within six months     SBE monthly: yes     Sun Exposure-Excessive: rarely  Safety-Violence-Falls     Seat Belt Use: yes     Smoke Detectors: yes     Violence in the Home: no risk noted     Sexual Abuse: no     Fall Risk: no      Sexual History:  widow.    Current Medications (verified): 1)  Norvasc 5 Mg  Tabs (Amlodipine Besylate) .Marland Kitchen.. 1 By Mouth Once Daily 2)  D3-50 50000 Unit Caps (Cholecalciferol) .... Take 1 Tab Once Daily 3)  Tamoxifen Citrate 20 Mg Tabs (Tamoxifen Citrate) .... Take 1 Tab Once Daily 4)  Hair/skin/nails  Tabs (Multiple Vitamins-Minerals) ....  Take 1 Tab Once Daily 5)  Womens One Daily  Tabs (Multiple Vitamins-Minerals) .... Take 1 Tab Once Daily 6)  Fish Oil 1200 Mg Caps (Omega-3 Fatty Acids) .... Take 1 Tab Once Daily 7)  Zostavax 16109 Unt/0.21ml Solr (Zoster Vaccine Live) .Marland Kitchen.. 1 Ml Im X1 8)  Aspirin 81 Mg Chew (Aspirin) .Marland Kitchen.. 1 By Mouth Once Daily  Allergies (verified): 1)  ! Relafen 2)  ! Erythromycin 3)  ! 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  Family History of Prostate CA  1st degree relative at 75 yo   Puncle---75 yo heart problem  Social History: Reviewed history from 04/13/2008 and no changes required. 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 Eyes:  Denies blurring, discharge, double vision, eye irritation, eye pain, halos, itching, light sensitivity, red eye, vision loss-1 eye, and vision loss-both eyes. ENT:  Denies decreased hearing, difficulty swallowing, ear discharge, earache,  hoarseness, nasal congestion, nosebleeds, postnasal drainage, ringing in ears, sinus pressure, and sore throat. CV:  Denies bluish discoloration of lips or nails, chest pain or discomfort, difficulty breathing at night, difficulty breathing while lying down, fainting, fatigue, leg cramps with exertion, lightheadness, near fainting, palpitations, shortness of breath with exertion, swelling of feet, swelling of hands, and weight gain. Resp:  Denies chest discomfort, chest pain with inspiration, cough, coughing up blood, excessive snoring, hypersomnolence, morning headaches, pleuritic, shortness of breath, sputum productive, and wheezing. GI:  Denies abdominal pain, bloody stools, change in bowel habits, constipation, dark tarry stools, diarrhea, excessive appetite, gas, hemorrhoids, indigestion, loss of appetite, nausea, vomiting, vomiting blood, and yellowish skin color. GU:  Denies abnormal vaginal bleeding, decreased libido, discharge, dysuria, genital sores, hematuria, incontinence, nocturia, urinary frequency, and urinary hesitancy. MS:  Complains of low back pain; denies joint pain, joint redness, joint swelling, loss of strength, mid back pain, muscle aches, muscle , cramps, muscle weakness, stiffness, and thoracic pain. Derm:  Denies changes in color of skin, changes in nail beds, dryness, excessive perspiration, flushing, hair loss, insect bite(s), itching, lesion(s), poor wound healing, and rash. Neuro:  Denies brief paralysis, difficulty with concentration, disturbances in coordination, falling down, headaches, inability to speak, memory loss, numbness, poor balance, seizures, sensation of room spinning, tingling, tremors, visual disturbances, and weakness. Psych:  Denies alternate hallucination ( auditory/visual), anxiety, depression, easily angered, easily tearful, irritability, mental problems, panic attacks, sense of great danger, suicidal thoughts/plans, thoughts of violence, unusual visions or  sounds, and thoughts /plans of harming others. Endo:  Denies cold intolerance, excessive hunger, excessive thirst, excessive urination, heat intolerance, polyuria, and weight change. Heme:  Denies abnormal bruising, bleeding, enlarge lymph nodes, fevers, pallor, and skin discoloration. Allergy:  Denies hives or rash, itching eyes, persistent infections, seasonal allergies, and sneezing.  Physical Exam  General:  Well-developed,well-nourished,in no acute distress; alert,appropriate and cooperative throughout examination Head:  Normocephalic and atraumatic without obvious abnormalities. No apparent alopecia or balding. Eyes:  pupils equal, pupils round, pupils reactive to light, and no injection.   Ears:  External ear exam shows no significant lesions or deformities.  Otoscopic examination reveals clear canals, tympanic membranes are intact bilaterally without bulging, retraction, inflammation or discharge. Hearing is grossly normal bilaterally. Nose:  External nasal examination shows no deformity or inflammation. Nasal mucosa are pink and moist without lesions or exudates. Mouth:  Oral mucosa and oropharynx without lesions or exudates.  Teeth in good repair. Neck:  No deformities, masses, or tenderness noted.no carotid bruits.   Chest Wall:  No deformities, masses, or tenderness noted. Breasts:  gyn Lungs:  Normal respiratory effort, chest expands symmetrically. Lungs are clear to auscultation, no crackles or wheezes. Heart:  normal rate and no murmur.   Abdomen:  Bowel sounds positive,abdomen soft and non-tender without masses, organomegaly or hernias noted. Rectal:  gyn Genitalia:  gyn Msk:  normal ROM, no joint warmth, no redness over joints, and no joint deformities.   Pulses:  R posterior tibial normal, R dorsalis pedis normal, R carotid normal, L posterior tibial normal, L dorsalis pedis normal, and L carotid normal.   Extremities:  No clubbing, cyanosis, edema, or deformity noted with  normal full range of motion of all joints.   Neurologic:  No cranial nerve deficits noted. Station and gait are normal. Plantar reflexes are down-going bilaterally. DTRs are symmetrical throughout. Sensory, motor and coordinative functions appear intact. Skin:  Intact without suspicious lesions or rashes Cervical Nodes:  No lymphadenopathy noted Psych:  Cognition and judgment appear intact. Alert and cooperative with normal attention span and concentration. No apparent delusions, illusions, hallucinations   Impression & Recommendations:  Problem # 1:  PREVENTIVE HEALTH CARE (ICD-V70.0)  Orders: Venipuncture (16109) TLB-Lipid Panel (80061-LIPID) TLB-BMP (Basic Metabolic Panel-BMET) (80048-METABOL) TLB-CBC Platelet - w/Differential (85025-CBCD) TLB-Hepatic/Liver Function Pnl (80076-HEPATIC) T-Vitamin D (25-Hydroxy) (60454-09811) Medicare -1st Annual Wellness Visit [phone]) EKG w/ Interpretation (93000)  Problem # 2:  HYPERLIPIDEMIA (ICD-272.4)  Orders: Venipuncture (29562) TLB-Lipid Panel (80061-LIPID) TLB-BMP (Basic Metabolic Panel-BMET) (80048-METABOL) TLB-CBC Platelet - w/Differential (85025-CBCD) TLB-Hepatic/Liver Function Pnl (80076-HEPATIC) T-Vitamin D (25-Hydroxy) [phone]) Medicare -1st Annual Wellness Visit [phone])  Problem # 3:  VITAMIN D DEFICIENCY (ICD-268.9)  Orders: Venipuncture (28413) TLB-Lipid Panel (80061-LIPID) TLB-BMP (Basic Metabolic Panel-BMET) (80048-METABOL) TLB-CBC Platelet - w/Differential (85025-CBCD) TLB-Hepatic/Liver Function Pnl (80076-HEPATIC) T-Vitamin D (25-Hydroxy) (24401-02725) Medicare -1st Annual Wellness Visit [phone])  Problem # 4:  MALIGNANT NEOPLASM OF BREAST UNSPECIFIED SITE (ICD-174.9)  Orders: Medicare -1st Annual Wellness Visit [phone])  Problem # 5:  OSTEOARTHRITIS (ICD-715.90)  Her updated medication list for this problem includes:    Aspirin 81 Mg Chew (Aspirin) .Marland Kitchen... 1 by mouth once daily  Orders: Medicare -1st  Annual Wellness Visit [phone])  Problem # 6:  HYPERTENSION (ICD-401.9)  Her updated medication list for this problem includes:    Norvasc 5 Mg Tabs (Amlodipine besylate) .Marland Kitchen... 1 by mouth once daily  Orders: Venipuncture (38756) TLB-Lipid Panel (80061-LIPID) TLB-BMP (Basic Metabolic Panel-BMET) (80048-METABOL) TLB-CBC Platelet - w/Differential (85025-CBCD) TLB-Hepatic/Liver Function Pnl (80076-HEPATIC) T-Vitamin D (25-Hydroxy) (43329-51884) Medicare -1st Annual Wellness Visit [phone]) EKG w/ Interpretation (93000)  Complete Medication List: 1)  Norvasc 5 Mg Tabs (Amlodipine besylate) .Marland Kitchen.. 1 by mouth once daily 2)  D3-50 50000 Unit Caps (Cholecalciferol) .... Take 1 tab once daily 3)  Tamoxifen Citrate 20 Mg Tabs (Tamoxifen citrate) .... Take 1 tab once daily 4)  Hair/skin/nails Tabs (Multiple vitamins-minerals) .... Take 1 tab once daily 5)  Womens One Daily Tabs (Multiple vitamins-minerals) .... Take 1 tab once daily 6)  Fish Oil 1200 Mg Caps (Omega-3 fatty acids) .... Take 1 tab once daily 7)  Zostavax 30160 Unt/0.31ml Solr (Zoster vaccine live) .Marland Kitchen.. 1 ml im x1 8)  Aspirin 81 Mg Chew (Aspirin) .Marland Kitchen.. 1 by mouth once daily  Patient Instructions: 1)  Please schedule a follow-up appointment in 1 year.  2)  rto flu vaccine Prescriptions: NORVASC 5 MG  TABS (AMLODIPINE BESYLATE) 1 by mouth once daily  #90 x 3   Entered and Authorized by:   Loreen Freud DO   Signed by:   Loreen Freud DO on 05/16/2010   Method used:   Print then Give to Patient   RxID:   [id number] ZOSTAVAX 27062 UNT/0.65ML SOLR (ZOSTER VACCINE LIVE) 1 ml IM x1  #1 x 0   Entered and Authorized by:   Loreen Freud DO   Signed  by:   Loreen Freud DO on 05/16/2010   Method used:   Print then Give to Patient   RxID:   [id number]    EKG  Procedure date:  05/16/2010  Findings:      Normal sinus rhythm with rate of:  72 bpm    Pneumovax Result Date:  05/28/2007 Pneumovax Result:  given Mammogram Result  Date:  05/18/2009 Mammogram Result:  cancer center  Appended Document: cpx/cbs  Laboratory Results   Urine Tests   Date/Time Reported: May 16, 2010 1:41 PM   Routine Urinalysis   Color: yellow Appearance: Clear Glucose: negative   (Normal Range: Negative) Bilirubin: negative   (Normal Range: Negative) Ketone: negative   (Normal Range: Negative) Spec. Gravity: <1.005   (Normal Range: 1.003-1.035) Blood: negative   (Normal Range: Negative) pH: 8.0   (Normal Range: 5.0-8.0) Protein: negative   (Normal Range: Negative) Urobilinogen: negative   (Normal Range: 0-1) Nitrite: negative   (Normal Range: Negative) Leukocyte Esterace: negative   (Normal Range: Negative)    Comments: Floydene Flock  May 16, 2010 1:41 PM      Appended Document: Orders Update    Clinical Lists Changes  Orders: Added new Service order of Prescription Created Electronically [phone]) - Signed

## 2010-10-10 NOTE — Assessment & Plan Note (Signed)
Summary: flu shot/kn  Nurse Visit   Allergies: 1)  ! Relafen 2)  ! Erythromycin 3)  ! Celebrex  Orders Added: 1)  Admin 1st Vaccine [90471] 2)  Flu Vaccine 63yrs + [72536] Flu Vaccine Consent Questions     Do you have a history of severe allergic reactions to this vaccine? no    Any prior history of allergic reactions to egg and/or gelatin? no    Do you have a sensitivity to the preservative Thimersol? no    Do you have a past history of Guillan-Barre Syndrome? no    Do you currently have an acute febrile illness? no    Have you ever had a severe reaction to latex? no    Vaccine information given and explained to patient? yes    Are you currently pregnant? no    Lot Number:[id number]   Exp Date:03/10/2011   Site Given  Left Deltoid IM .lbflu1

## 2010-10-10 NOTE — Letter (Signed)
Summary: [facility]  [facility]   Imported By: Lanelle Bal 06/19/2010 14:13:35  _____________________________________________________________________  External Attachment:    Type:   Image     Comment:   External Document

## 2010-10-10 NOTE — Letter (Signed)
Summary: [facility]  [facility]   Imported By: Lanelle Bal 05/09/2010 08:07:59  _____________________________________________________________________  External Attachment:    Type:   Image     Comment:   External Document

## 2010-10-26 NOTE — Letter (Signed)
Summary: [facility]  [facility]   Imported By: Maryln Gottron 10/20/2010 09:25:11  _____________________________________________________________________  External Attachment:    Type:   Image     Comment:   External Document

## 2010-11-03 ENCOUNTER — Ambulatory Visit (INDEPENDENT_AMBULATORY_CARE_PROVIDER_SITE_OTHER): Payer: Medicare Other | Admitting: Family Medicine

## 2010-11-03 ENCOUNTER — Encounter: Payer: Self-pay | Admitting: Family Medicine

## 2010-11-03 DIAGNOSIS — J209 Acute bronchitis, unspecified: Secondary | ICD-10-CM

## 2010-11-03 DIAGNOSIS — J019 Acute sinusitis, unspecified: Secondary | ICD-10-CM

## 2010-11-07 NOTE — Assessment & Plan Note (Signed)
Summary: cough/cbs   Vital Signs:  Patient profile:   77 year old female Weight:      178.2 pounds O2 Sat:      98 % on Room air Temp:     99.5 degrees F oral Pulse rate:   100 / minute Pulse rhythm:   regular BP sitting:   126 / 64  (left arm) Cuff size:   large  Vitals Entered By: Almeta Monas CMA Duncan Dull) (November 03, 2010 9:53 AM)  O2 Flow:  Room air CC: x 3 days c/o URI and dry cough , URI symptoms   History of Present Illness:       This is an 77 year old woman who presents with URI symptoms.  The symptoms began 4 days ago.  Pt c/o b/l sinus pressure--taking otc meds with no relief.  The patient complains of nasal congestion, purulent nasal discharge, sore throat, productive cough, earache, and sick contacts.  The patient denies fever, low-grade fever (<100.5 degrees), fever of 100.5-103 degrees, fever of 103.1-104 degrees, fever to >104 degrees, stiff neck, dyspnea, wheezing, rash, vomiting, diarrhea, use of an antipyretic, and response to antipyretic.  The patient also reports headache.  The patient denies the following risk factors for Strep sinusitis: unilateral facial pain, unilateral nasal discharge, poor response to decongestant, double sickening, tooth pain, Strep exposure, tender adenopathy, and absence of cough.    Current Medications (verified): 1)  Norvasc 5 Mg  Tabs (Amlodipine Besylate) .Marland Kitchen.. 1 By Mouth Once Daily 2)  D3-50 50000 Unit Caps (Cholecalciferol) .... Take 1 Tab Once Daily 3)  Tamoxifen Citrate 20 Mg Tabs (Tamoxifen Citrate) .... Take 1 Tab Once Daily 4)  Hair/skin/nails  Tabs (Multiple Vitamins-Minerals) .... Take 1 Tab Once Daily 5)  Womens One Daily  Tabs (Multiple Vitamins-Minerals) .... Take 1 Tab Once Daily 6)  Fish Oil 1200 Mg Caps (Omega-3 Fatty Acids) .... Take 1 Tab Once Daily 7)  Zostavax 16109 Unt/0.32ml Solr (Zoster Vaccine Live) .Marland Kitchen.. 1 Ml Im X1 8)  Aspirin 81 Mg Chew (Aspirin) .Marland Kitchen.. 1 By Mouth Once Daily  Allergies (verified): 1)  !  Relafen 2)  ! Erythromycin 3)  ! Celebrex  Past History:  Past medical, surgical, family and social histories (including risk factors) reviewed for relevance to current acute and chronic problems.  Past Medical History: Reviewed history from 04/13/2008 and no changes required. Hypertension Osteoarthritis  Past Surgical History: Reviewed history from 04/13/2008 and no changes required. Hysterectomy Appendectomy cyst removed from neck  Family History: Reviewed history from 05/05/2009 and no changes required.  Family History of Prostate CA 1st degree relative at 75 yo   Puncle---75 yo heart problem  Social History: Reviewed history from 04/13/2008 and no changes required. 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   Mouth:  Oral mucosa and oropharynx without lesions or exudates.  Teeth in good repair. Neck:  No deformities, masses, or tenderness noted. Lungs:  occass wheeze with expnormal respiratory effort.   Heart:  normal rate and no murmur.   Psych:  Oriented X3 and normally interactive.     Impression & Recommendations:  Problem # 1:  SINUSITIS -  ACUTE-NOS (ICD-461.9)  Her updated medication list for this problem includes:    Cheratussin Ac 100-10 Mg/11ml Syrp (Guaifenesin-codeine) .Marland Kitchen... 1-2 tsp by mouth at bedtime as needed    Ceftin 500 Mg Tabs (Cefuroxime axetil) .Marland Kitchen... 1 by mouth two times a day    Flonase 50 Mcg/act Susp (Fluticasone propionate) .Marland Kitchen... 2  sprays each nostril once daily  Instructed on treatment. Call if symptoms persist or worsen.    Orders: Prescription Created Electronically [phone])  Problem # 2:  BRONCHITIS- ACUTE (ICD-466.0)  Her updated medication list for this problem includes:    Cheratussin Ac 100-10 Mg/106ml Syrp (Guaifenesin-codeine) .Marland Kitchen... 1-2 tsp by mouth at bedtime as needed    Ceftin 500 Mg Tabs (Cefuroxime axetil) .Marland Kitchen... 1 by mouth two times a day  Take antibiotics and other medications as directed. Encouraged to push clear liquids, get enough rest, and take acetaminophen as needed. To be seen in 5-7 days if no improvement, sooner if worse.  Orders: Prescription Created Electronically [phone])  Complete Medication List: 1)  Norvasc 5 Mg Tabs (Amlodipine besylate) .Marland Kitchen.. 1 by mouth once daily 2)  D3-50 50000 Unit Caps (Cholecalciferol) .... Take 1 tab once daily 3)  Tamoxifen Citrate 20 Mg Tabs (Tamoxifen citrate) .... Take 1 tab once daily 4)  Hair/skin/nails Tabs (Multiple vitamins-minerals) .... Take 1 tab once daily 5)  Womens One Daily Tabs (Multiple vitamins-minerals) .... Take 1 tab once daily 6)  Fish Oil 1200 Mg Caps (Omega-3 fatty acids) .... Take 1 tab once daily 7)  Zostavax 13086 Unt/0.26ml Solr (Zoster vaccine live) .Marland Kitchen.. 1 ml im x1 8)  Aspirin 81 Mg Chew (Aspirin) .Marland Kitchen.. 1 by mouth once daily 9)  Cheratussin Ac 100-10 Mg/40ml Syrp (Guaifenesin-codeine) .Marland Kitchen.. 1-2 tsp by mouth at bedtime as needed 10)  Ceftin 500 Mg Tabs (Cefuroxime axetil) .Marland Kitchen.. 1 by mouth two times a day 11)  Flonase 50 Mcg/act Susp (Fluticasone propionate) .... 2  sprays each nostril once daily Prescriptions: FLONASE 50 MCG/ACT SUSP (FLUTICASONE PROPIONATE) 2  sprays each nostril once daily  #1 x 2   Entered and Authorized by:   Loreen Freud DO   Signed by:   Loreen Freud DO on 11/03/2010   Method used:   Electronically to        [facility] [phone]* (retail)       [address]       [place], [place]  [address]       Ph: [phone]       Fax: [phone]   RxID:   [id number] CEFTIN  500 MG TABS (CEFUROXIME AXETIL) 1 by mouth two times a day  #20 x 0   Entered and Authorized by:   Loreen Freud DO   Signed by:   Loreen Freud DO on 11/03/2010   Method used:   Electronically to        [facility] [phone]* (retail)       [address]       [place], [place]  [address]       Ph: [phone]       Fax: [phone]   RxID:   [id number] CHERATUSSIN AC 100-10 MG/5ML SYRP (GUAIFENESIN-CODEINE) 1-2 tsp by mouth at bedtime as needed  #6 oz x 0   Entered and Authorized by:   Loreen Freud DO   Signed by:   Loreen Freud DO on 11/03/2010   Method  used:   Print then Give to Patient   RxID:   [id number]    Orders Added: 1)  Est. Patient Level III [[address]] 2)  Prescription Created Electronically [phone]

## 2011-01-23 NOTE — Discharge Summary (Signed)
NAME:  Shannon Jensen, Shannon Jensen NO.:  [id number]   MEDICAL RECORD NO.:  [id number]          PATIENT TYPE:  OBV   LOCATION:  3707                         FACILITY:  [facility]   PHYSICIAN:  Hillery Aldo, M.D.   DATE OF BIRTH:  09/02/1928   DATE OF ADMISSION:  03/30/2008  DATE OF DISCHARGE:  04/01/2008                               DISCHARGE SUMMARY   PRIMARY CARE PHYSICIAN:  Currently unassigned but plans to follow up  with Dr. Laury Axon with [facility].   DISCHARGE DIAGNOSES:  1. Atypical chest pain, acute coronary syndrome ruled out with three      sets of negative cardiac enzymes.  2. Osteoarthritis, bilateral shoulders.  3. Hypertension.  4. Dyslipidemia.  5. Paresthesias.  6. Obesity.   DISCHARGE MEDICATIONS:  1. Aspirin 325 mg daily.  2. Oxycodone 5 mg q.6 h. p.r.n. pain.  3. Metoprolol 25 mg b.i.d.  4. Extra-Strength Tylenol 1000 mg q.6 h. p.r.n.   CONSULTATIONS:  None.   BRIEF ADMISSION HISTORY OF PRESENT ILLNESS:  The patient is a 77-year-  old African-American female who presented to the hospital for evaluation  of atypical chest pain, shoulder, and neck pain.  She was therefore  admitted to rule out acute coronary syndrome and to evaluate her more  fully.  For the full details, please see the dictated report done by Dr.  Flonnie Overman.   PROCEDURES AND DIAGNOSTIC STUDIES:  1. Right shoulder films on March 30, 2008, showed advanced degenerative      changes at right shoulder with no fracture or dislocation      identified.  2. Chest x-ray on March 30, 2008, showed no active lung disease.      Bilateral degenerative joint disease involving both shoulders.  3. Ankle-brachial indices, pending at the time of the dictation.   DISCHARGE LABORATORY VALUES:  Sodium was 138, potassium 4.1, chloride  108, bicarb 25, BUN 14, creatinine 0.87, and glucose 94.  Liver function  studies were completely within normal limits with exception of slightly  low albumin at 3.3.   TSH was normal at 1.434.  Cardiac markers were  negative x3.  Homocystine was 12.8.  White blood cell count was 6.9,  hemoglobin 12.5, hematocrit 37.2, and platelets 191.  Total cholesterol  was 199, triglycerides 62, HDL 50, and LDL 137.   HOSPITAL COURSE:  1. Atypical chest pain:  The patient's pain was fairly atypical and      most likely due to her underlying osteoarthritis involving the      right shoulder with possible muscle strain.  Acute coronary      syndrome was ruled out with three sets of negative cardiac enzymes.      The patient was monitored on telemetry.  She had no abnormal      rhythms.  Her 12-lead EKG was nonacute.  At this juncture, the      patient is stable for discharge with further followup as directed      by her primary care physician.  2. Osteoarthritis of the shoulders:  The patient has tried Celebrex in  the past but apparently had a bad reaction.  She seems to respond      well to low-dose oxycodone and Extra-Strength Tylenol, which we      will provide her with prescriptions at discharge.  3. Hypertension:  The patient's hypertension has been reasonably      controlled on metoprolol.  We will discharge her on this      medication.  4. Dyslipidemia:  The patient wants to discuss this with her primary      care physician before initiating any medication treatments.  The      patient has been advised a low-fat diet and handouts provided to      the patient prior to discharge.  5. Paresthesias:  The patient has intermittent lower extremity      paresthesias.  We are checking ABIs.  If these are unrevealing, we      would recommend further evaluation as directed by her primary care      physician.  Note, the patient has no evidence of anemia and her TSH      was within normal limits.   DISPOSITION:  The patient is medically stable and would be discharged  home today.       Hillery Aldo, M.D.  Electronically Signed     CR/MEDQ  D:   04/01/2008  T:  04/02/2008  Job:  [id number]   cc:   Lelon Perla, DO

## 2011-01-23 NOTE — H&P (Signed)
NAME:  Shannon Jensen, Shannon Jensen NO.:  [id number]   MEDICAL RECORD NO.:  [id number]          PATIENT TYPE:  EMS   LOCATION:  MAJO                         FACILITY:  [facility]   PHYSICIAN:  Lucita Ferrara, MD         DATE OF BIRTH:  09-16-1927   DATE OF ADMISSION:  03/30/2008  DATE OF DISCHARGE:                              HISTORY & PHYSICAL   PRIMARY CARE DOCTOR:  Was [facility] currently between  transfer to [facility] primary care.   The patient is 77 year old African American female comes in with  atypical type of chest pain located the right side of the chest, right  neck not associated with any exertion.  Started earlier in the daytime 7  out of 10 in intensity not associated with any shortness of breath,  nausea, vomiting or gastritis.  Currently chest pain has subsided.  She  also has some neck pain associated with it.  No arm tingling or finger,  radiation to the jaw.  She has never had similar complaints.  Never had  a cardiology workup for had any coronary evaluation via catheterization  or had stress test in the past.  She does not have many risk factors as  she has never had this in the past, never been diagnosed with  hypertension.   PAST MEDICAL HISTORY:  Is significant for:  1. Osteoarthritis.  2. Hyperlipidemia.   SOCIAL HISTORY:  She denies drugs alcohol or tobacco.   TRAVEL HISTORY:  None.   ALLERGIES:  Allergic to:  1. NIACIN.  2. ROBAXIN.   MEDICATIONS:  None.   REVIEW OF SYSTEMS:  Otherwise negative.   PHYSICAL EXAMINATION:  Generally speaking the patient is in no acute  distress.  Her blood pressure initially was 191/97, currently is 161/87.  Pulse 84.  Respirations 20.  Temperature 99.  HEENT:  Normocephalic, atraumatic.  Sclerae anicteric.  NECK:  Supple.  No JVD or carotid bruits.  PERRLA.  Extraocular muscles  intact.  CARDIOVASCULAR:  S1, S2, regular rate and rhythm.  No murmurs, rubs,  clicks.  ABDOMEN:  Soft, nontender,  nondistended.  Positive bowel sounds.  LUNGS:  Clear to auscultation bilaterally.  No rhonchi, rales or  wheezes.  EXTREMITIES:  No clubbing, cyanosis or edema.  NEURO:  Patient is alert, oriented x3.  Cranial nerves II through XII  grossly intact.  CHEST:  There is no chest wall tenderness.   EKG shows normal sinus rhythm.  No ST-T wave changes.  Two sets of  troponin is negative.  Chest x-ray is negative, shows bilateral  degenerative joint disease.  CMP negative.  X-ray of the right shoulder  shows that advanced degenerative changes of the right shoulder.  No  fracture or dislocation.   ASSESSMENT/PLAN:  The patient is a 77 year old with:  1. A very atypical type of chest pain located on the right side      located in her right shoulder.  2. Hypertension that is uncontrolled.  3. Shoulder pain with known degenerative joint disease.   DISCUSSION/PLAN:  I do believe that she is at low  risk given no prior  history, no risk factors.  Regardless, we will go ahead and acute  coronary syndrome protocol, cycle her cardiac enzymes x2 every 8 hours.  She would likely be discharged home tomorrow to follow up with  outpatient physician for possible stress test as an outpatient, although  I do believe that can also be scheduled while she follows up with  [facility].  The rest of the plans are really dependent on her progress.  She will be here for only a 24-hour observation.      Lucita Ferrara, MD  Electronically Signed     RR/MEDQ  D:  03/31/2008  T:  03/31/2008  Job:  [id number]

## 2011-01-23 NOTE — Op Note (Signed)
NAME:  Shannon Jensen, Shannon Jensen NO.:  [id number]   MEDICAL RECORD NO.:  [id number]          PATIENT TYPE:  AMB   LOCATION:  DSC                          FACILITY:  [facility]   PHYSICIAN:  Leonie Man, M.D.   DATE OF BIRTH:  1928-06-04   DATE OF PROCEDURE:  05/24/2008  DATE OF DISCHARGE:                               OPERATIVE REPORT   PREOPERATIVE DIAGNOSIS:  Carcinoma, right breast.   POSTOPERATIVE DIAGNOSIS:  Carcinoma, right breast.   PROCEDURE:  1. Blue dye injection.  2. Needle-localized lumpectomy, right breast.  3. Sentinel lymph node biopsy.   SURGEON:  Leonie Man, MD   ASSISTANT:  OR nurse.   ANESTHESIA:  General.   Note, Shannon Jensen is a 77 year old lady with a diagnosed right-sided breast  cancer seen on mammogram and core biopsied.  This lesion measures  approximately 9 mm in a greatest diameter as measured on the  mammographic and ultrasound films.  This shows an invasive carcinoma.  Clinical staging at T1 Nx Mx.  The patient comes now to the operating  room for a needle-localized excision and sentinel lymph node biopsy.   PROCEDURE:  The patient is anesthetized and the right breast was prepped  and draped to be included in a sterile operative field.  The patient is  identified as Shannon Jensen, operation to be done right breast  lumpectomy and with a sentinel lymph node biopsy on the right.  All  presurgical precautions are carried out.   PROCEDURE DETAILS:  I made an elliptical incision around the localizing  needle after having injected the periareolar tissue with approximately 4  mL of diluted methylene blue dye and massaged this into the lymphatics.  The superior, inferior, medial, and lateral flaps were raised and a  portion of breast tissue taken for sufficient margins all the way down  to the chest wall.  This was removed putting a long suture on the medial  side and double suture on the superior side of the specimen and this was  forwarded for radiologic evaluation.  I received a call back from the  radiologist and the clip was well positioned within the center of the  specimen and the lesion could be seen.  This wound was then packed with  gauze.  After hemostasis was obtained, I then turned my attention to the  right axilla where with the use of the NeoProbe, I found the area of  highest counts and made a transverse incision in the axilla over this  area deepening this through skin and subcutaneous tissue following the  highest counts with the NeoProbe down to 2 large blue hot nodes.  These  were both dissected free and forwarded for pathologic evaluation.  Touch  prep evaluations of both nodes 1 and 2 were negative for metastatic  carcinoma.  All areas of dissection within the axilla were then checked  for hemostasis and noted to be dry.  The breast wound was then closed  with deep tissues with interrupted 2-0 Vicryl sutures, subcutaneous  tissue with interrupted 3-0 Vicryl suture, and the skin closed with 5-0  Monocryl  suture reinforced with Dermabond and Steri-Strips.  Similarly,  the axilla was closed with a deep  sutures of 2-0 Vicryl, subcutaneous tissue with 3-0 Vicryl, and skin  with a 5-0 Monocryl and reinforced with Dermabond and Steri-Strips.  Sterile dressings were applied, the anesthetic reversed, and the patient  removed from the operating room to the recovery room in stable  condition.  She tolerated the procedure well.      Leonie Man, M.D.  Electronically Signed     PB/MEDQ  D:  05/24/2008  T:  05/25/2008  Job:  [id number]

## 2011-02-09 ENCOUNTER — Emergency Department (HOSPITAL_COMMUNITY): Payer: No Typology Code available for payment source

## 2011-02-09 ENCOUNTER — Emergency Department (HOSPITAL_COMMUNITY)
Admission: EM | Admit: 2011-02-09 | Discharge: 2011-02-09 | Disposition: A | Discharge status: Home / Self Care | Payer: No Typology Code available for payment source | Attending: Emergency Medicine | Admitting: Emergency Medicine

## 2011-02-09 DIAGNOSIS — Z79899 Other long term (current) drug therapy: Secondary | ICD-10-CM | POA: Insufficient documentation

## 2011-02-09 DIAGNOSIS — M549 Dorsalgia, unspecified: Secondary | ICD-10-CM | POA: Insufficient documentation

## 2011-02-09 DIAGNOSIS — Y998 Other external cause status: Secondary | ICD-10-CM | POA: Insufficient documentation

## 2011-02-09 DIAGNOSIS — S139XXA Sprain of joints and ligaments of unspecified parts of neck, initial encounter: Secondary | ICD-10-CM | POA: Insufficient documentation

## 2011-02-09 DIAGNOSIS — S335XXA Sprain of ligaments of lumbar spine, initial encounter: Secondary | ICD-10-CM | POA: Insufficient documentation

## 2011-02-09 DIAGNOSIS — Y92410 Unspecified street and highway as the place of occurrence of the external cause: Secondary | ICD-10-CM | POA: Insufficient documentation

## 2011-02-09 DIAGNOSIS — Z853 Personal history of malignant neoplasm of breast: Secondary | ICD-10-CM | POA: Insufficient documentation

## 2011-02-09 DIAGNOSIS — M542 Cervicalgia: Secondary | ICD-10-CM | POA: Insufficient documentation

## 2011-02-09 DIAGNOSIS — R51 Headache: Secondary | ICD-10-CM | POA: Insufficient documentation

## 2011-02-09 DIAGNOSIS — Z7982 Long term (current) use of aspirin: Secondary | ICD-10-CM | POA: Insufficient documentation

## 2011-02-09 LAB — POCT I-STAT, CHEM 8
BUN: 17 mg/dL (ref 6–23)
Calcium, Ion: 1.08 mmol/L — ABNORMAL LOW (ref 1.12–1.32)
Creatinine, Ser: 1.1 mg/dL (ref 0.4–1.2)
Glucose, Bld: 90 mg/dL (ref 70–99)
Hemoglobin: 14.6 g/dL (ref 12.0–15.0)
TCO2: 22 mmol/L (ref 0–100)

## 2011-02-09 LAB — DIFFERENTIAL
Basophils Absolute: 0 10*3/uL (ref 0.0–0.1)
Basophils Relative: 0 % (ref 0–1)
Eosinophils Relative: 1 % (ref 0–5)
Monocytes Absolute: 0.6 10*3/uL (ref 0.1–1.0)
Monocytes Relative: 7 % (ref 3–12)

## 2011-02-09 LAB — CBC
MCH: 30.1 pg (ref 26.0–34.0)
MCHC: 34.1 g/dL (ref 30.0–36.0)
RDW: 14.6 % (ref 11.5–15.5)

## 2011-02-12 ENCOUNTER — Other Ambulatory Visit: Payer: Self-pay | Admitting: Family Medicine

## 2011-03-03 ENCOUNTER — Encounter: Payer: Self-pay | Admitting: Family Medicine

## 2011-03-06 ENCOUNTER — Ambulatory Visit: Payer: Medicare Other | Admitting: Family Medicine

## 2011-03-07 ENCOUNTER — Encounter (HOSPITAL_BASED_OUTPATIENT_CLINIC_OR_DEPARTMENT_OTHER): Payer: Medicare Other | Admitting: Oncology

## 2011-03-07 ENCOUNTER — Other Ambulatory Visit: Payer: Self-pay | Admitting: Oncology

## 2011-03-07 DIAGNOSIS — M949 Disorder of cartilage, unspecified: Secondary | ICD-10-CM

## 2011-03-07 DIAGNOSIS — R5381 Other malaise: Secondary | ICD-10-CM

## 2011-03-07 DIAGNOSIS — C50219 Malignant neoplasm of upper-inner quadrant of unspecified female breast: Secondary | ICD-10-CM

## 2011-03-07 LAB — COMPREHENSIVE METABOLIC PANEL
ALT: 14 U/L (ref 0–35)
AST: 18 U/L (ref 0–37)
Alkaline Phosphatase: 38 U/L — ABNORMAL LOW (ref 39–117)
CO2: 27 mEq/L (ref 19–32)
Creatinine, Ser: 0.97 mg/dL (ref 0.50–1.10)
Sodium: 139 mEq/L (ref 135–145)
Total Bilirubin: 0.4 mg/dL (ref 0.3–1.2)
Total Protein: 6.5 g/dL (ref 6.0–8.3)

## 2011-03-07 LAB — CBC WITH DIFFERENTIAL/PLATELET
BASO%: 0.3 % (ref 0.0–2.0)
EOS%: 0.9 % (ref 0.0–7.0)
LYMPH%: 21.5 % (ref 14.0–49.7)
MCH: 31.2 pg (ref 25.1–34.0)
MCHC: 33.7 g/dL (ref 31.5–36.0)
MONO#: 0.7 10*3/uL (ref 0.1–0.9)
NEUT%: 68.2 % (ref 38.4–76.8)
Platelets: 180 10*3/uL (ref 145–400)
RBC: 3.94 10*6/uL (ref 3.70–5.45)
WBC: 8 10*3/uL (ref 3.9–10.3)

## 2011-03-09 ENCOUNTER — Ambulatory Visit (INDEPENDENT_AMBULATORY_CARE_PROVIDER_SITE_OTHER): Payer: Medicare Other | Admitting: Family Medicine

## 2011-03-09 ENCOUNTER — Encounter: Payer: Self-pay | Admitting: Family Medicine

## 2011-03-09 VITALS — BP 160/80 | HR 85 | Temp 99.0°F | Wt 175.4 lb

## 2011-03-09 DIAGNOSIS — S069X9A Unspecified intracranial injury with loss of consciousness of unspecified duration, initial encounter: Secondary | ICD-10-CM

## 2011-03-09 DIAGNOSIS — M545 Low back pain: Secondary | ICD-10-CM | POA: Insufficient documentation

## 2011-03-09 DIAGNOSIS — R51 Headache: Secondary | ICD-10-CM

## 2011-03-09 DIAGNOSIS — M79604 Pain in right leg: Secondary | ICD-10-CM | POA: Insufficient documentation

## 2011-03-09 DIAGNOSIS — S060X9A Concussion with loss of consciousness of unspecified duration, initial encounter: Secondary | ICD-10-CM

## 2011-03-09 MED ORDER — MELOXICAM 15 MG PO TABS: ORAL_TABLET | ORAL | Status: DC

## 2011-03-09 NOTE — Assessment & Plan Note (Signed)
Pt with constant headaches and balance issues ER reports reviewed Refer to neuro

## 2011-03-09 NOTE — Assessment & Plan Note (Signed)
Warm compresses mobic and robaxin for pain rto prn
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
## 2011-04-05 ENCOUNTER — Ambulatory Visit (INDEPENDENT_AMBULATORY_CARE_PROVIDER_SITE_OTHER): Payer: Medicare Other | Admitting: Family Medicine

## 2011-04-05 ENCOUNTER — Encounter: Payer: Self-pay | Admitting: Family Medicine

## 2011-04-05 ENCOUNTER — Ambulatory Visit: Payer: Medicare Other | Admitting: Family Medicine

## 2011-04-05 VITALS — BP 142/70 | HR 87 | Temp 98.1°F | Wt 175.6 lb

## 2011-04-05 DIAGNOSIS — F0781 Postconcussional syndrome: Secondary | ICD-10-CM | POA: Insufficient documentation

## 2011-04-05 DIAGNOSIS — R51 Headache: Secondary | ICD-10-CM

## 2011-04-05 NOTE — Assessment & Plan Note (Signed)
Refer chiro for acupuncture con't pt

## 2011-04-05 NOTE — Assessment & Plan Note (Signed)
D/w pt that it takes time to recover F/u neuro prn

## 2011-04-05 NOTE — Patient Instructions (Signed)
Post Concussion Syndrome, Adult You have had a previous head injury that may be causing some long lasting symptoms such as headache and dizziness. Most problems get better within one to two days after the injury. However, some problems may last for weeks or months. The following table lists some of the symptoms (problems) that may be bothersome for an unknown length of time after the injury. THESE MINOR SYMPTOMS MAY BE EXPERIENCED AFTER DISCHARGE:  Memory difficulties  Dizziness   Headaches   Double vision   Hearing difficulties   Depression  Tiredness   Weakness   Difficulty with concentration   Vomiting   If you experience any of these symptoms you should not be alarmed. A bruise on the brain (concussion) requires time for recovery the same as a bruise elsewhere on your body. Symptoms such as these are common following a head injury. Usually these problems disappear without medical care. However, if symptoms continue, or are getting worse rather than better, see your caregiver. Having an established, ongoing doctor-patient relationship with a primary caregiver will be helpful in managing this problem. HOME CARE INSTRUCTIONS  Only take over-the-counter or prescription medicines for pain, discomfort, or fever as directed by your caregiver.   Sleeping with your head slightly elevated may help with headaches.  Although it is unlikely that serious side effects will occur, be aware of signs and symptoms that may call for your return to this location. SEEK IMMEDIATE MEDICAL ATTENTION IF:  Confusion or drowsiness. Children, however, often become drowsy after any type of trauma (damage caused by an accident) or injury.   Inability to arouse the injured person.   Nausea (feeling sick to your stomach) or persistent, forceful vomiting (projectile in nature).   Vertigo. This may be noted in the patient by rapid back and forth movement of their eyes.   Convulsions or unconsciousness.    Severe persistent headaches not relieved by medication. Do not take aspirin as this slows blood clotting. Take other pain medications only as directed.   Unable to use arms or legs appropriately.   Changes in pupil sizes.   Clear or bloody discharge from nose or ears.  Document Released: 02/16/2002 Document Re-Released: 06/24/2009 [facility] Patient Information 2011 [place], [place].

## 2011-04-05 NOTE — Progress Notes (Signed)
  Subjective:    Patient ID: Shannon Jensen, female    DOB: 11/12/1927, 77 y.o.   MRN: [id number]  HPI Pt here to f/u neurologist.  Pt is disappointed and wants to know why she still hurts.  Pt still having constant headache.      Review of Systems as above and see previous ov   Objective:   Physical Exam  Constitutional: She appears well-developed and well-nourished.  Psychiatric: She has a normal mood and affect. Her behavior is normal. Judgment and thought content normal.          Assessment & Plan:

## 2011-04-12 ENCOUNTER — Ambulatory Visit: Payer: Medicare Other | Admitting: Radiation Oncology

## 2011-04-16 ENCOUNTER — Telehealth: Payer: Self-pay

## 2011-04-16 DIAGNOSIS — R51 Headache: Secondary | ICD-10-CM

## 2011-04-16 DIAGNOSIS — S069X9A Unspecified intracranial injury with loss of consciousness of unspecified duration, initial encounter: Secondary | ICD-10-CM

## 2011-04-16 DIAGNOSIS — G44309 Post-traumatic headache, unspecified, not intractable: Secondary | ICD-10-CM

## 2011-04-16 NOTE — Telephone Encounter (Signed)
Ok to order MRI with and w/o

## 2011-04-16 NOTE — Telephone Encounter (Signed)
Call from patient and she requested to get the MRI of her head, she stated she declined it at first but her headaches are no better. Please advise   KP

## 2011-04-16 NOTE — Telephone Encounter (Signed)
Renee I am having trouble entering this MRI due to the patient's insurance, any suggestion?

## 2011-04-17 ENCOUNTER — Other Ambulatory Visit: Payer: Self-pay | Admitting: Family Medicine

## 2011-04-17 ENCOUNTER — Telehealth: Payer: Self-pay | Admitting: Family Medicine

## 2011-04-17 DIAGNOSIS — R51 Headache: Secondary | ICD-10-CM

## 2011-04-17 DIAGNOSIS — S0990XA Unspecified injury of head, initial encounter: Secondary | ICD-10-CM

## 2011-04-17 DIAGNOSIS — J019 Acute sinusitis, unspecified: Secondary | ICD-10-CM

## 2011-04-17 MED ORDER — ALPRAZOLAM 0.25 MG PO TABS: ORAL_TABLET | ORAL | Status: DC

## 2011-04-17 NOTE — Telephone Encounter (Signed)
Please advise      KP 

## 2011-04-17 NOTE — Telephone Encounter (Signed)
Xanax 0.25 mg  1 po tid prn  #15-----take one 30 min prior to procedure

## 2011-04-17 NOTE — Telephone Encounter (Signed)
Ordered by Dr. Laury Axon    KP

## 2011-04-17 NOTE — Telephone Encounter (Signed)
See note

## 2011-04-17 NOTE — Telephone Encounter (Signed)
In reference to MRI Brain, patient is requesting medication to be sent to [facility] to help relax her.  Please call patient when done, per her request.

## 2011-04-17 NOTE — Telephone Encounter (Signed)
Rx faxed and patient made aware     KP 

## 2011-04-21 ENCOUNTER — Ambulatory Visit
Admission: RE | Admit: 2011-04-21 | Discharge: 2011-04-21 | Disposition: A | Discharge status: Home / Self Care | Payer: Medicare Other | Source: Ambulatory Visit | Attending: Family Medicine | Admitting: Family Medicine

## 2011-04-21 DIAGNOSIS — S0990XA Unspecified injury of head, initial encounter: Secondary | ICD-10-CM

## 2011-04-21 DIAGNOSIS — R51 Headache: Secondary | ICD-10-CM

## 2011-04-21 MED ORDER — GADOBENATE DIMEGLUMINE 529 MG/ML IV SOLN
15.0000 mL | Freq: Once | INTRAVENOUS | Status: AC | PRN
  Administered 2011-04-21: 15 mL via INTRAVENOUS

## 2011-04-23 ENCOUNTER — Telehealth: Payer: Self-pay | Admitting: *Deleted

## 2011-04-23 NOTE — Telephone Encounter (Signed)
Notes Recorded by Lelon Perla, DO on 04/21/2011 at 6:49 PM Normal MRI---- refer to neuro if headache con't                  Left message to call back

## 2011-04-30 NOTE — Telephone Encounter (Signed)
Discuss with patient will cone in to discuss results

## 2011-05-02 ENCOUNTER — Encounter: Payer: Self-pay | Admitting: Gastroenterology

## 2011-05-07 ENCOUNTER — Ambulatory Visit (INDEPENDENT_AMBULATORY_CARE_PROVIDER_SITE_OTHER): Payer: Medicare Other | Admitting: Family Medicine

## 2011-05-07 ENCOUNTER — Encounter: Payer: Self-pay | Admitting: Family Medicine

## 2011-05-07 VITALS — BP 146/88 | HR 81 | Temp 99.0°F | Wt 177.6 lb

## 2011-05-07 DIAGNOSIS — I10 Essential (primary) hypertension: Secondary | ICD-10-CM

## 2011-05-07 DIAGNOSIS — S139XXA Sprain of joints and ligaments of unspecified parts of neck, initial encounter: Secondary | ICD-10-CM

## 2011-05-07 DIAGNOSIS — M810 Age-related osteoporosis without current pathological fracture: Secondary | ICD-10-CM

## 2011-05-07 MED ORDER — CYCLOBENZAPRINE HCL 5 MG PO TABS: 5.0000 mg | ORAL_TABLET | Freq: Three times a day (TID) | ORAL | Status: DC | PRN

## 2011-05-07 MED ORDER — CHOLECALCIFEROL 25 MCG (1000 UT) PO CAPS: 1000.0000 [IU] | ORAL_CAPSULE | Freq: Every day | ORAL | Status: AC

## 2011-05-07 MED ORDER — AMLODIPINE BESYLATE 5 MG PO TABS: ORAL_TABLET | ORAL | Status: DC
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
## 2011-05-10 ENCOUNTER — Ambulatory Visit
Admission: RE | Admit: 2011-05-10 | Discharge: 2011-05-10 | Disposition: A | Discharge status: Home / Self Care | Payer: Medicare Other | Source: Ambulatory Visit | Attending: Radiation Oncology | Admitting: Radiation Oncology

## 2011-05-15 ENCOUNTER — Other Ambulatory Visit: Payer: Self-pay | Admitting: Family Medicine

## 2011-05-15 ENCOUNTER — Telehealth: Payer: Self-pay

## 2011-05-15 DIAGNOSIS — R51 Headache: Secondary | ICD-10-CM

## 2011-05-15 NOTE — Telephone Encounter (Signed)
Pt would like neurology referral for neurologist within [facility]. She states that she does not want to go back to the one that she went to last time.

## 2011-05-15 NOTE — Telephone Encounter (Signed)
Ok to send to [facility] neuro--- order put in

## 2011-05-16 ENCOUNTER — Encounter: Payer: Self-pay | Admitting: Neurology

## 2011-05-29 ENCOUNTER — Ambulatory Visit (INDEPENDENT_AMBULATORY_CARE_PROVIDER_SITE_OTHER): Payer: Medicare Other | Admitting: Neurology

## 2011-05-29 ENCOUNTER — Encounter: Payer: Self-pay | Admitting: Neurology

## 2011-05-29 DIAGNOSIS — R7309 Other abnormal glucose: Secondary | ICD-10-CM

## 2011-05-29 DIAGNOSIS — R51 Headache: Secondary | ICD-10-CM

## 2011-05-29 DIAGNOSIS — G609 Hereditary and idiopathic neuropathy, unspecified: Secondary | ICD-10-CM

## 2011-05-29 MED ORDER — TIZANIDINE HCL 2 MG PO CAPS: 2.0000 mg | ORAL_CAPSULE | Freq: Two times a day (BID) | ORAL | Status: DC
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
## 2011-05-30 ENCOUNTER — Ambulatory Visit (INDEPENDENT_AMBULATORY_CARE_PROVIDER_SITE_OTHER): Payer: Medicare Other | Admitting: Gastroenterology

## 2011-05-30 ENCOUNTER — Encounter: Payer: Self-pay | Admitting: Gastroenterology

## 2011-05-30 ENCOUNTER — Other Ambulatory Visit (INDEPENDENT_AMBULATORY_CARE_PROVIDER_SITE_OTHER): Payer: Medicare Other

## 2011-05-30 VITALS — BP 122/62 | HR 72 | Ht 64.0 in | Wt 175.0 lb

## 2011-05-30 DIAGNOSIS — Z800 Family history of malignant neoplasm of digestive organs: Secondary | ICD-10-CM

## 2011-05-30 DIAGNOSIS — R7309 Other abnormal glucose: Secondary | ICD-10-CM

## 2011-05-30 DIAGNOSIS — G609 Hereditary and idiopathic neuropathy, unspecified: Secondary | ICD-10-CM

## 2011-05-30 DIAGNOSIS — R51 Headache: Secondary | ICD-10-CM

## 2011-05-30 DIAGNOSIS — Z1211 Encounter for screening for malignant neoplasm of colon: Secondary | ICD-10-CM

## 2011-05-30 DIAGNOSIS — K5900 Constipation, unspecified: Secondary | ICD-10-CM

## 2011-05-30 DIAGNOSIS — K219 Gastro-esophageal reflux disease without esophagitis: Secondary | ICD-10-CM

## 2011-05-30 LAB — COMPREHENSIVE METABOLIC PANEL
AST: 24 U/L (ref 0–37)
BUN: 16 mg/dL (ref 6–23)
CO2: 26 mEq/L (ref 19–32)
Calcium: 9.2 mg/dL (ref 8.4–10.5)
Chloride: 107 mEq/L (ref 96–112)
Creatinine, Ser: 0.9 mg/dL (ref 0.4–1.2)
GFR: 80.41 mL/min (ref >60.00)

## 2011-05-30 LAB — CBC WITH DIFFERENTIAL/PLATELET
Basophils Relative: 0.4 % (ref 0.0–3.0)
Eosinophils Relative: 1.4 % (ref 0.0–5.0)
Hemoglobin: 12.4 g/dL (ref 12.0–15.0)
Lymphocytes Relative: 25.5 % (ref 12.0–46.0)
Monocytes Relative: 8.8 % (ref 3.0–12.0)
Neutro Abs: 4.4 10*3/uL (ref 1.4–7.7)
RBC: 4.04 Mil/uL (ref 3.87–5.11)

## 2011-05-30 LAB — VITAMIN B12: Vitamin B-12: 511 pg/mL (ref 211–911)

## 2011-05-30 MED ORDER — NA SULFATE-K SULFATE-MG SULF 17.5-3.13-1.6 GM/177ML PO SOLN: 1.0000 | Freq: Every day | ORAL | Status: DC

## 2011-05-30 NOTE — Progress Notes (Signed)
History of Present Illness: This is an 77 -year-old female here today with her daughter. She has had mild constipation for the past 3-4 years and she relates this to medication side effects. This is easily managed with fiber and occasional laxative. She has had episodes of acid reflux occurring once or twice a month that responded promptly to [facility]. Her mother was diagnosed with colon cancer at age 54. She underwent a screening colonoscopy in October 2002 showed ascending colon diverticulosis. Recommended to return for a screening colonoscopy in 2007 but this was not accomplished. Denies weight loss, abdominal pain, diarrhea, change in stool caliber, melena, hematochezia, nausea, vomiting, dysphagia, chest pain.   Past Medical History  Diagnosis Date  . Hypertension   . Osteoarthritis   . GERD (gastroesophageal reflux disease)   . Diverticulosis   . Anxiety and depression   . Breast cancer 12/ 2009  . Chronic headaches   . HLD (hyperlipidemia)    Past Surgical History  Procedure Date  . Abdominal hysterectomy 1998  . Appendectomy 1970  . Breast surgery 2009    cancer-lymph node removal  . Neck surgery     cyst removal    reports that she has never smoked. She has never used smokeless tobacco. She reports that she does not drink alcohol or use illicit drugs. family history includes Appendicitis in her daughter; Breast cancer in her sister; Colon cancer in her mother; Colon polyps in her daughter; Heart disease in her paternal uncle; Pancreatic cancer in her brother; Prostate cancer in her father; and Stroke in her daughter. Allergies  Allergen Reactions  . Celecoxib   . Erythromycin   . Nabumetone    Outpatient Encounter Prescriptions as of 05/30/2011  Medication Sig Dispense Refill  . acetaminophen (TYLENOL) 325 MG tablet Take 650 mg by mouth every 6 (six) hours as needed.        . ALPRAZolam (XANAX) 0.25 MG tablet 1 tablet by mouth three times a day prn. Take 1 tablet 30 minutes prior  to procedure  15 tablet  0  . amLODipine (NORVASC) 5 MG tablet 1 po qd  30 tablet  2  . aspirin 81 MG tablet Take 81 mg by mouth daily.        . Cholecalciferol 1000 UNITS capsule Take 1 capsule (1,000 Units total) by mouth daily.      . cyclobenzaprine (FLEXERIL) 5 MG tablet Take 1 tablet (5 mg total) by mouth every 8 (eight) hours as needed for muscle spasms.  30 tablet  1  . Multiple Vitamins-Minerals (RA HAIR/SKIN/NAILS) TABS Take 1 tablet by mouth daily.        . Multiple Vitamins-Minerals (WOMENS ONE DAILY) TABS Take 1 tablet by mouth daily.        . Omega-3 Fatty Acids (FISH OIL) 1200 MG CAPS Take 1 capsule by mouth daily.        . tamoxifen (NOLVADEX) 20 MG tablet Take 20 mg by mouth daily.        . tizanidine (ZANAFLEX) 2 MG capsule Take 1 capsule (2 mg total) by mouth 2 (two) times daily.  60 capsule  5  . triamcinolone (KENALOG) 0.1 % ointment       . Na Sulfate-K Sulfate-Mg Sulf (SUPREP BOWEL PREP) SOLN Take 1 kit by mouth daily.  177 mL  0   Review of Systems: Pertinent positive and negative review of systems were noted in the above HPI section. All other review of systems were otherwise negative.  Physical  Exam: General: Well developed , well nourished, no acute distress Head: Normocephalic and atraumatic Eyes:  sclerae anicteric, EOMI Ears: Normal auditory acuity Mouth: No deformity or lesions Neck: Supple, no masses or thyromegaly Lungs: Clear throughout to auscultation Heart: Regular rate and rhythm; no murmurs, rubs or bruits Abdomen: Soft, non tender and non distended. No masses, hepatosplenomegaly or hernias noted. Normal Bowel sounds Rectal: Deferred to colonoscopy  Musculoskeletal: Symmetrical with no gross deformities  Skin: No lesions on visible extremities Pulses:  Normal pulses noted Extremities: No clubbing, cyanosis, edema or deformities noted Neurological: Alert oriented x 4, grossly nonfocal Cervical Nodes:  No significant cer vical adenopathy Inguinal  Nodes: No significant inguinal adenopathy Psychological:  Alert and cooperative. Normal mood andaffect  Assessment and Recommendations:  1. Family history of colon cancer. Mother age 39. She is overdue for higher risk screening colonoscopy. Schedule colonoscopy. The risks, benefits, and alternatives to colonoscopy with possible biopsy and possible polypectomy were discussed with the patient and they consent to proceed.   2. Mild chronic constipation. Increase dietary fiber and water intake. Milk of magnesia or MiraLax as needed.   3. Mild GERD. Antireflux measures and TUMS when necessary. If symptoms worsen add an over-the-counter H2 blocker or PPI.

## 2011-05-30 NOTE — Patient Instructions (Addendum)
You have been scheduled for a Colonoscopy. See separate instructions.  Pick up your prep kit from your pharmacy.  cc: Dierdre Forth, MD

## 2011-05-31 ENCOUNTER — Encounter: Payer: Self-pay | Admitting: Gastroenterology

## 2011-06-01 LAB — PROTEIN ELECTROPHORESIS, SERUM
Albumin ELP: 55.7 % — ABNORMAL LOW (ref 55.8–66.1)
Alpha-2-Globulin: 8.9 % (ref 7.1–11.8)
Beta Globulin: 6 % (ref 4.7–7.2)
Total Protein, Serum Electrophoresis: 6.7 g/dL (ref 6.0–8.3)

## 2011-06-05 ENCOUNTER — Telehealth: Payer: Self-pay

## 2011-06-05 NOTE — Telephone Encounter (Signed)
Message copied by Lelon Huh on Tue Jun 05, 2011 10:24 AM ------      Message from: Milas Gain      Created: Tue Jun 05, 2011  9:46 AM       HI Kathleen Likins.  Could you call Ms. Mccardle and let her know her lab work for her possible peripheral neuropathy and headache was ok.  Thanks.

## 2011-06-06 NOTE — Telephone Encounter (Signed)
Pt stopped by and made aware of her lab results.

## 2011-06-08 LAB — CARDIAC PANEL(CRET KIN+CKTOT+MB+TROPI)
CK, MB: 1.2
CK, MB: 1.6
Relative Index: 0.8
Total CK: 162
Troponin I: 0.01

## 2011-06-08 LAB — COMPREHENSIVE METABOLIC PANEL
ALT: 17
AST: 21
Calcium: 9.3
GFR calc Af Amer: >60
Glucose, Bld: 94
Sodium: 138
Total Protein: 6.2

## 2011-06-08 LAB — POCT I-STAT, CHEM 8
BUN: 17
Chloride: 107
Creatinine, Ser: 1.5 — ABNORMAL HIGH
Hemoglobin: 13.9
Potassium: 4.3
Sodium: 139

## 2011-06-08 LAB — CK TOTAL AND CKMB (NOT AT ARMC)
CK, MB: 1.1
Relative Index: 0.6
Total CK: 171

## 2011-06-08 LAB — PROTIME-INR
INR: 1
Prothrombin Time: 13.5

## 2011-06-08 LAB — CBC
MCHC: 33.5
Platelets: 191
RDW: 15.1

## 2011-06-08 LAB — LIPID PANEL
HDL: 50
Total CHOL/HDL Ratio: 4

## 2011-06-08 LAB — POCT CARDIAC MARKERS
CKMB, poc: 1.4
Myoglobin, poc: 48.6
Myoglobin, poc: 54.8
Troponin i, poc: <0.05

## 2011-06-12 ENCOUNTER — Encounter: Payer: Self-pay | Admitting: Gastroenterology

## 2011-06-12 ENCOUNTER — Ambulatory Visit (AMBULATORY_SURGERY_CENTER): Payer: Medicare Other | Admitting: Gastroenterology

## 2011-06-12 DIAGNOSIS — D126 Benign neoplasm of colon, unspecified: Secondary | ICD-10-CM

## 2011-06-12 DIAGNOSIS — Z800 Family history of malignant neoplasm of digestive organs: Secondary | ICD-10-CM

## 2011-06-12 DIAGNOSIS — Z1211 Encounter for screening for malignant neoplasm of colon: Secondary | ICD-10-CM

## 2011-06-12 MED ORDER — SODIUM CHLORIDE 0.9 % IV SOLN: 500.0000 mL | INTRAVENOUS | Status: DC

## 2011-06-12 NOTE — Patient Instructions (Addendum)
Green and blue discharge instructions reviewed with patient and care partner.  Impressions/recommendations:  Polyp (handout given)  May resume medications as you were taking them prior to your procedure.Marland KitchenMarland KitchenEXCEPT FOR ASPIRIN. DO NOT TAKE ASPIRIN, ASPIRIN CONTAINING PRODUCTS, ADVIL OR ARTHRITIS MEDICATIONS FOR 2 WEEKS. May resume 06/27/2011.

## 2011-06-13 ENCOUNTER — Telehealth: Payer: Self-pay | Admitting: *Deleted

## 2011-06-13 LAB — DIFFERENTIAL
Basophils Absolute: 0
Lymphocytes Relative: 27
Monocytes Absolute: 0.7
Neutro Abs: 3.9
Neutrophils Relative %: 61

## 2011-06-13 LAB — CBC
HCT: 38.4
MCV: 89.7
Platelets: 200
WBC: 6.4

## 2011-06-13 LAB — COMPREHENSIVE METABOLIC PANEL
Albumin: 3.7
BUN: 8
Chloride: 104
Creatinine, Ser: 0.92
GFR calc non Af Amer: 59 — ABNORMAL LOW
Total Bilirubin: 0.8

## 2011-06-13 LAB — PROTIME-INR
INR: 1
Prothrombin Time: 13.7

## 2011-06-13 NOTE — Telephone Encounter (Signed)

## 2011-06-14 ENCOUNTER — Ambulatory Visit (INDEPENDENT_AMBULATORY_CARE_PROVIDER_SITE_OTHER): Payer: Medicare Other | Admitting: Neurology

## 2011-06-14 ENCOUNTER — Encounter: Payer: Self-pay | Admitting: Neurology

## 2011-06-14 VITALS — BP 150/80 | HR 88 | Wt 174.0 lb

## 2011-06-14 DIAGNOSIS — R51 Headache: Secondary | ICD-10-CM

## 2011-06-14 MED ORDER — TIZANIDINE HCL 2 MG PO TABS: ORAL_TABLET | ORAL | Status: DC

## 2011-06-14 NOTE — Progress Notes (Signed)
Dear Dr. Laury Axon,  I saw Shannon Jensen back at [facility] for problems with headache.  She continues to have her headaches associated with her diffuse neck, shoulder and back pain.  My hypothesis is that this is a cervicogenic headache.  I ordered an ESR and CRP at her last visit but these were normal.  I started her on Zanaflex 2mg  bid and she thinks this "relaxes her" but her headaches continue to be bad in the a.m.  She has seen her orthopedist and he has recommended aquatic therapy.  The headaches are sometime associated with blurriness of vision and dizziness.  The Zanaflex can be sedating for her but she feels this has improved.  Notably peripheral neuropathy labs were also unrevealing.  PMHX, Surgical HX, SocHx, FamHx and Medications have not changed since her last visit  Exam: Filed Vitals:   06/14/11 1132  BP: 150/80  Pulse: 88   Gen:  well appearing older AAW in NAD H&N:  ++ decreased mobility of the neck in all directions, tenderness in paraspinal area as well as bilateral trapezius Neuro:  AOx3, PERRL, EOMI, face symmetric, tongue/u/p midline Motor:  5/5 muscle strength bilaterally but some antalgic give away in the legs. Reflexes: 2+ throughout except 1+ in the ankles. Gait and station antalgic.  Impression:  Cervicogenic headache  Recs: 1.  Headache and neck pain - I would like to increase her Zanaflex at night to 4mg .  I am hoping this helps her morning headache.  I have recommended looking into massage therapy and acupuncture as well.  I am going to get an MRI of her cervical spine to look for any intervinable abnormality, but I think this is unlikely. 2.  Peripheral neuropathy - No EMG/NCS for now.  Largely asymptomatic so will continue to monitor.  It is possible some of the numbness in her feet is coming from her lumbar spine as she has shooting pains sometimes down the legs, but we will leave her L-spine up to her orthopedist to manage for now.  I will see her back  in 1 month, but will call her with the results of her MRI c-spine.  Lupita Raider Modesto Charon, MD [facility], [place]

## 2011-06-14 NOTE — Patient Instructions (Addendum)
Your MRI is scheduled for Thursday, October 11th at 1:00pm.  Please arrive to [facility] by 12:45pm.  If you are unable to make this appointment please call [phone].

## 2011-06-15 ENCOUNTER — Telehealth: Payer: Self-pay | Admitting: Family Medicine

## 2011-06-15 DIAGNOSIS — M545 Low back pain, unspecified: Secondary | ICD-10-CM

## 2011-06-15 NOTE — Telephone Encounter (Signed)
Ok to put referral in 

## 2011-06-15 NOTE — Telephone Encounter (Signed)
Patient needs a new referral to [facility] - patient made an appt for [id number] --she had referral in July but patient never made an appt

## 2011-06-15 NOTE — Telephone Encounter (Signed)
Please advise      KP 

## 2011-06-15 NOTE — Telephone Encounter (Signed)
Done  KP

## 2011-06-18 ENCOUNTER — Encounter: Payer: Self-pay | Admitting: Gastroenterology

## 2011-06-19 ENCOUNTER — Telehealth: Payer: Self-pay | Admitting: Neurology

## 2011-06-19 MED ORDER — ALPRAZOLAM 0.5 MG PO TABS: ORAL_TABLET | ORAL | Status: DC

## 2011-06-19 NOTE — Telephone Encounter (Signed)
Called xanax to [facility] at [facility], pt is aware.

## 2011-06-19 NOTE — Telephone Encounter (Signed)
Pt has MRI scheduled 06/21/2011 and is worried about being inside the closed machine. Is there an over the counter medication she can take or can we call her in an rx?

## 2011-06-19 NOTE — Telephone Encounter (Signed)
Tiffany -- Can you call in Xanax 0.5mg , 2 tabs, -- 1 tab 30 minutes before, and repeat one if necessary for anxiety.  thx.

## 2011-06-20 ENCOUNTER — Telehealth: Payer: Self-pay

## 2011-06-20 NOTE — Telephone Encounter (Signed)
Spoke with pt and gave her the advice from Dr. Modesto Charon.

## 2011-06-20 NOTE — Telephone Encounter (Signed)
Since Sunday, pt's eyes have been itching and burning and she wanted to check to see if you thought it was a reaction to the muscle relaxer she started.  She said she is not having any other problems like swelling or s.o.b.

## 2011-06-20 NOTE — Telephone Encounter (Signed)
If you could let her know that I think that would be very unlikely.  She is welcome to hold the medication for a couple of days, but it sounds more consistent with an environmental allergy.

## 2011-06-21 ENCOUNTER — Ambulatory Visit (HOSPITAL_COMMUNITY)
Admission: RE | Admit: 2011-06-21 | Discharge: 2011-06-21 | Disposition: A | Discharge status: Home / Self Care | Payer: Medicare Other | Source: Ambulatory Visit | Attending: Neurology | Admitting: Neurology

## 2011-06-21 DIAGNOSIS — M25519 Pain in unspecified shoulder: Secondary | ICD-10-CM | POA: Insufficient documentation

## 2011-06-21 DIAGNOSIS — R51 Headache: Secondary | ICD-10-CM | POA: Insufficient documentation

## 2011-06-21 DIAGNOSIS — M47812 Spondylosis without myelopathy or radiculopathy, cervical region: Secondary | ICD-10-CM | POA: Insufficient documentation

## 2011-06-21 DIAGNOSIS — M5020 Other cervical disc displacement, unspecified cervical region: Secondary | ICD-10-CM | POA: Insufficient documentation

## 2011-06-21 DIAGNOSIS — M542 Cervicalgia: Secondary | ICD-10-CM | POA: Insufficient documentation

## 2011-06-24 ENCOUNTER — Encounter: Payer: Self-pay | Admitting: Neurology

## 2011-07-10 ENCOUNTER — Ambulatory Visit: Payer: Medicare Other | Admitting: Neurology

## 2011-07-12 ENCOUNTER — Ambulatory Visit: Payer: Medicare Other | Admitting: Neurology

## 2011-07-16 ENCOUNTER — Ambulatory Visit: Payer: Medicare Other | Admitting: Neurology

## 2011-07-31 ENCOUNTER — Encounter: Payer: Self-pay | Admitting: *Deleted

## 2011-08-01 ENCOUNTER — Other Ambulatory Visit: Payer: Self-pay | Admitting: Obstetrics and Gynecology

## 2011-08-01 DIAGNOSIS — Z853 Personal history of malignant neoplasm of breast: Secondary | ICD-10-CM

## 2011-08-07 ENCOUNTER — Telehealth: Payer: Self-pay | Admitting: Oncology

## 2011-08-07 NOTE — Telephone Encounter (Signed)
Pt called to cancel her appts with dr ha for 08/08/2011

## 2011-08-08 ENCOUNTER — Other Ambulatory Visit: Payer: Medicare Other | Admitting: Lab

## 2011-08-08 ENCOUNTER — Ambulatory Visit: Payer: Medicare Other | Admitting: Oncology

## 2011-08-15 ENCOUNTER — Ambulatory Visit
Admission: RE | Admit: 2011-08-15 | Discharge: 2011-08-15 | Disposition: A | Discharge status: Home / Self Care | Payer: Medicare Other | Source: Ambulatory Visit | Attending: Obstetrics and Gynecology | Admitting: Obstetrics and Gynecology

## 2011-08-15 DIAGNOSIS — Z853 Personal history of malignant neoplasm of breast: Secondary | ICD-10-CM

## 2011-08-24 ENCOUNTER — Telehealth: Payer: Self-pay | Admitting: Neurology

## 2011-08-24 NOTE — Telephone Encounter (Signed)
Pt has fu appt scheduled on 10/03/2011. She wants to know if there is any way you can see her before Christmas for her headaches. Let pt know that your schedule was already tight next week but that I would send you a message and ask.

## 2011-08-24 NOTE — Telephone Encounter (Signed)
1:00 p.m Jan 2

## 2011-08-24 NOTE — Telephone Encounter (Signed)
Appt rescheduled

## 2011-08-27 ENCOUNTER — Ambulatory Visit: Payer: Medicare Other | Admitting: Family Medicine

## 2011-08-28 ENCOUNTER — Encounter: Payer: Self-pay | Admitting: Family Medicine

## 2011-08-28 ENCOUNTER — Ambulatory Visit: Payer: Medicare Other | Admitting: Family Medicine

## 2011-08-29 ENCOUNTER — Encounter: Payer: Self-pay | Admitting: Family Medicine

## 2011-08-29 ENCOUNTER — Ambulatory Visit (INDEPENDENT_AMBULATORY_CARE_PROVIDER_SITE_OTHER): Payer: Medicare Other | Admitting: Family Medicine

## 2011-08-29 VITALS — BP 138/84 | HR 82 | Temp 98.0°F | Wt 172.2 lb

## 2011-08-29 DIAGNOSIS — R519 Headache, unspecified: Secondary | ICD-10-CM | POA: Insufficient documentation

## 2011-08-29 DIAGNOSIS — R51 Headache: Secondary | ICD-10-CM

## 2011-08-29 MED ORDER — METAXALONE 800 MG PO TABS: 800.0000 mg | ORAL_TABLET | Freq: Three times a day (TID) | ORAL | Status: AC

## 2011-08-29 NOTE — Assessment & Plan Note (Signed)
?   From bulging disc ---see mRI Try skelaxin--- may need to go back to flexeril if no relief I offered ultram but she wants to wait to speak to Dr Modesto Charon at her appointment

## 2011-08-29 NOTE — Progress Notes (Signed)
  Subjective:    Patient ID: Shannon Jensen, female    DOB: 09-01-1928, 77 y.o.   MRN: [id number]  HPI Pt here f/u Neuro and acupuncture --no other problems. She could not take zanaflex secondary to rash.  She has a f/u appointment with Neuro next week but would like some relief from headaches.     Review of Systems As above    Objective:   Physical Exam  Constitutional: She is oriented to person, place, and time. She appears well-developed and well-nourished.  Neurological: She is alert and oriented to person, place, and time.  Psychiatric: She has a normal mood and affect. Her behavior is normal. Judgment and thought content normal.          Assessment & Plan:

## 2011-08-30 ENCOUNTER — Other Ambulatory Visit: Payer: Self-pay | Admitting: Family Medicine

## 2011-08-30 DIAGNOSIS — M542 Cervicalgia: Secondary | ICD-10-CM

## 2011-09-12 ENCOUNTER — Encounter: Payer: Self-pay | Admitting: Neurology

## 2011-09-12 ENCOUNTER — Ambulatory Visit (INDEPENDENT_AMBULATORY_CARE_PROVIDER_SITE_OTHER): Payer: Medicare Other | Admitting: Neurology

## 2011-09-12 VITALS — BP 156/80 | HR 88 | Ht 62.0 in | Wt 173.0 lb

## 2011-09-12 DIAGNOSIS — R51 Headache: Secondary | ICD-10-CM
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
## 2011-09-12 NOTE — Patient Instructions (Signed)
Follow up as needed

## 2011-09-27 ENCOUNTER — Other Ambulatory Visit: Payer: Self-pay | Admitting: *Deleted

## 2011-09-27 DIAGNOSIS — C50219 Malignant neoplasm of upper-inner quadrant of unspecified female breast: Secondary | ICD-10-CM

## 2011-09-27 MED ORDER — TAMOXIFEN CITRATE 20 MG PO TABS: 20.0000 mg | ORAL_TABLET | Freq: Every day | ORAL | Status: DC
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
## 2011-10-03 ENCOUNTER — Ambulatory Visit: Payer: Medicare Other | Admitting: Neurology

## 2011-10-12 ENCOUNTER — Other Ambulatory Visit: Payer: Self-pay | Admitting: Family Medicine

## 2011-11-12 ENCOUNTER — Telehealth: Payer: Self-pay | Admitting: Oncology

## 2011-11-12 NOTE — Telephone Encounter (Signed)
pt called and r/s missed appt for nov2012 for [phone]

## 2011-11-29 ENCOUNTER — Ambulatory Visit: Payer: Medicare Other | Admitting: Oncology

## 2011-11-29 ENCOUNTER — Ambulatory Visit (HOSPITAL_BASED_OUTPATIENT_CLINIC_OR_DEPARTMENT_OTHER): Payer: Medicare Other | Admitting: Oncology

## 2011-11-29 ENCOUNTER — Telehealth: Payer: Self-pay | Admitting: Oncology

## 2011-11-29 ENCOUNTER — Ambulatory Visit (HOSPITAL_BASED_OUTPATIENT_CLINIC_OR_DEPARTMENT_OTHER): Payer: Medicare Other | Admitting: Lab

## 2011-11-29 VITALS — BP 161/78 | HR 88 | Temp 98.7°F | Ht 62.0 in | Wt 175.2 lb

## 2011-11-29 DIAGNOSIS — C50919 Malignant neoplasm of unspecified site of unspecified female breast: Secondary | ICD-10-CM

## 2011-11-29 DIAGNOSIS — I10 Essential (primary) hypertension: Secondary | ICD-10-CM

## 2011-11-29 DIAGNOSIS — C50219 Malignant neoplasm of upper-inner quadrant of unspecified female breast: Secondary | ICD-10-CM

## 2011-11-29 DIAGNOSIS — IMO0001 Reserved for inherently not codable concepts without codable children: Secondary | ICD-10-CM

## 2011-11-29 DIAGNOSIS — M899 Disorder of bone, unspecified: Secondary | ICD-10-CM

## 2011-11-29 LAB — COMPREHENSIVE METABOLIC PANEL
Alkaline Phosphatase: 37 U/L — ABNORMAL LOW (ref 39–117)
Glucose, Bld: 89 mg/dL (ref 70–99)
Sodium: 141 mEq/L (ref 135–145)
Total Bilirubin: 0.5 mg/dL (ref 0.3–1.2)
Total Protein: 6.7 g/dL (ref 6.0–8.3)

## 2011-11-29 LAB — CBC WITH DIFFERENTIAL/PLATELET
BASO%: 0.4 % (ref 0.0–2.0)
LYMPH%: 25.3 % (ref 14.0–49.7)
MCH: 31.1 pg (ref 25.1–34.0)
MCHC: 33.7 g/dL (ref 31.5–36.0)
MCV: 92.3 fL (ref 79.5–101.0)
MONO%: 8 % (ref 0.0–14.0)
Platelets: 174 10*3/uL (ref 145–400)
RBC: 4 10*6/uL (ref 3.70–5.45)

## 2011-11-29 MED ORDER — TAMOXIFEN CITRATE 20 MG PO TABS: 20.0000 mg | ORAL_TABLET | Freq: Every day | ORAL | Status: DC

## 2011-11-29 NOTE — Telephone Encounter (Signed)
appts made and printed for pt aom °

## 2011-11-29 NOTE — Patient Instructions (Signed)
1.  History of breast cancer:  You are in remission.  Last mammogram in December 2012 was normal.    Recommendations:  - continue Tamoxifen hormonal therapy to decrease the risk of recurrent cancer.  Due to finish in February 2015.  - Return visit with me in about 6 months.
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
## 2011-12-27 ENCOUNTER — Other Ambulatory Visit: Payer: Self-pay | Admitting: Family Medicine

## 2011-12-28 NOTE — Telephone Encounter (Signed)
Refill done.  

## 2012-02-02 ENCOUNTER — Other Ambulatory Visit: Payer: Self-pay | Admitting: Family Medicine

## 2012-03-24 ENCOUNTER — Other Ambulatory Visit: Payer: Self-pay | Admitting: Family Medicine

## 2012-04-17 ENCOUNTER — Ambulatory Visit: Payer: Medicare Other | Admitting: Radiation Oncology

## 2012-04-24 ENCOUNTER — Ambulatory Visit
Admission: RE | Admit: 2012-04-24 | Discharge: 2012-04-24 | Disposition: A | Discharge status: Home / Self Care | Payer: Medicare Other | Source: Ambulatory Visit | Attending: Radiation Oncology | Admitting: Radiation Oncology

## 2012-04-24 ENCOUNTER — Encounter: Payer: Self-pay | Admitting: Radiation Oncology

## 2012-04-24 VITALS — BP 140/83 | HR 89 | Temp 97.9°F | Wt 175.9 lb

## 2012-04-24 DIAGNOSIS — C50919 Malignant neoplasm of unspecified site of unspecified female breast: Secondary | ICD-10-CM
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
## 2012-04-24 NOTE — Progress Notes (Signed)
Rechecked patients b/p per patient  Request, b/p=161/84, p=65, rr=20, f/u appt with Dr.Manning ( 1 year ) given to patient  11:14 AM -

## 2012-05-14 ENCOUNTER — Encounter (HOSPITAL_COMMUNITY): Payer: Self-pay | Admitting: Emergency Medicine

## 2012-05-14 ENCOUNTER — Emergency Department (HOSPITAL_COMMUNITY)
Admission: EM | Admit: 2012-05-14 | Discharge: 2012-05-15 | Disposition: A | Discharge status: Home / Self Care | Payer: Medicare Other | Attending: Emergency Medicine | Admitting: Emergency Medicine

## 2012-05-14 DIAGNOSIS — F411 Generalized anxiety disorder: Secondary | ICD-10-CM | POA: Insufficient documentation

## 2012-05-14 DIAGNOSIS — Z79899 Other long term (current) drug therapy: Secondary | ICD-10-CM | POA: Insufficient documentation

## 2012-05-14 DIAGNOSIS — R42 Dizziness and giddiness: Secondary | ICD-10-CM | POA: Insufficient documentation

## 2012-05-14 DIAGNOSIS — E785 Hyperlipidemia, unspecified: Secondary | ICD-10-CM | POA: Insufficient documentation

## 2012-05-14 DIAGNOSIS — M25569 Pain in unspecified knee: Secondary | ICD-10-CM | POA: Insufficient documentation

## 2012-05-14 DIAGNOSIS — R51 Headache: Secondary | ICD-10-CM | POA: Insufficient documentation

## 2012-05-14 DIAGNOSIS — W19XXXA Unspecified fall, initial encounter: Secondary | ICD-10-CM

## 2012-05-14 DIAGNOSIS — W100XXA Fall (on)(from) escalator, initial encounter: Secondary | ICD-10-CM | POA: Insufficient documentation

## 2012-05-14 DIAGNOSIS — Z853 Personal history of malignant neoplasm of breast: Secondary | ICD-10-CM | POA: Insufficient documentation

## 2012-05-14 DIAGNOSIS — K219 Gastro-esophageal reflux disease without esophagitis: Secondary | ICD-10-CM | POA: Insufficient documentation

## 2012-05-14 DIAGNOSIS — I10 Essential (primary) hypertension: Secondary | ICD-10-CM | POA: Insufficient documentation

## 2012-05-14 DIAGNOSIS — F3289 Other specified depressive episodes: Secondary | ICD-10-CM | POA: Insufficient documentation

## 2012-05-14 DIAGNOSIS — F329 Major depressive disorder, single episode, unspecified: Secondary | ICD-10-CM | POA: Insufficient documentation
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
## 2012-05-15 ENCOUNTER — Emergency Department (HOSPITAL_COMMUNITY): Payer: Medicare Other

## 2012-05-15 ENCOUNTER — Encounter (HOSPITAL_COMMUNITY): Payer: Self-pay | Admitting: Radiology

## 2012-05-15 NOTE — ED Provider Notes (Signed)
History     CSN: [id number]  Arrival date & time 05/14/12  2020   First MD Initiated Contact with Patient 05/15/12 0013      Chief Complaint  Patient presents with  . Fall    (Consider location/radiation/quality/duration/timing/severity/associated sxs/prior treatment) Patient is a 78 y.o. female presenting with fall. The history is provided by the patient.  Fall The accident occurred 3 to 5 hours ago. Incident: hwile on an escalator  She fell from a height of 3 to 5 ft. She landed on a hard floor. There was no blood loss. Point of impact: knees and head. Pain location: bilateral knee pain, HA, & dizziness. The pain is at a severity of 6/10. The pain is mild. She was ambulatory at the scene. There was no entrapment after the fall. There was no drug use involved in the accident. There was no alcohol use involved in the accident. Associated symptoms include headaches. Pertinent negatives include no visual change, no fever, no numbness, no abdominal pain, no bowel incontinence, no nausea, no vomiting, no hematuria, no hearing loss, no loss of consciousness and no tingling. The symptoms are aggravated by activity and ambulation. She has tried nothing for the symptoms.    Past Medical History  Diagnosis Date  . Hypertension   . Osteoarthritis   . GERD (gastroesophageal reflux disease)   . Diverticulosis   . Anxiety and depression   . Breast cancer 12/ 2009  . Chronic headaches   . HLD (hyperlipidemia)     Past Surgical History  Procedure Date  . Abdominal hysterectomy 1998  . Appendectomy 1970  . Breast surgery 2009    cancer-lymph node removal  . Neck surgery     cyst removal    Family History  Problem Relation Age of Onset  . Prostate cancer Father     brother  . Heart disease Paternal Uncle   . Colon cancer Mother   . Breast cancer Sister   . Pancreatic cancer Brother   . Colon polyps Daughter     x 2, son  . Stroke Daughter     x 2  . Appendicitis Daughter    appendectomy  . Cancer Other     prostate    History  Substance Use Topics  . Smoking status: Never Smoker   . Smokeless tobacco: Never Used  . Alcohol Use: No    OB History    Grav Para Term Preterm Abortions TAB SAB Ect Mult Living                  Review of Systems  Constitutional: Negative for fever, chills and appetite change.  HENT: Negative for congestion.   Eyes: Negative for visual disturbance.  Respiratory: Negative for shortness of breath.   Cardiovascular: Negative for chest pain and leg swelling.  Gastrointestinal: Negative for nausea, vomiting, abdominal pain and bowel incontinence.  Genitourinary: Negative for dysuria, urgency, frequency and hematuria.  Neurological: Positive for dizziness and headaches. Negative for tingling, loss of consciousness, syncope, weakness, light-headedness and numbness.  Psychiatric/Behavioral: Negative for confusion.  All other systems reviewed and are negative.    Allergies  Celecoxib; Erythromycin; Methocarbamol; and Nabumetone  Home Medications   Current Outpatient Rx  Name Route Sig Dispense Refill  . ACETAMINOPHEN 325 MG PO TABS Oral Take 650 mg by mouth every 6 (six) hours as needed. For pain    . AMLODIPINE BESYLATE 5 MG PO TABS Oral Take 5 mg by mouth daily.    Marland Kitchen  ASPIRIN 81 MG PO TABS Oral Take 81 mg by mouth daily.      Marland Kitchen BACLOFEN 10 MG PO TABS Oral Take 5-10 mg by mouth 3 (three) times daily.     . COD LIVER OIL PO Oral Take 1 tablet by mouth daily.     Marland Kitchen RA HAIR/SKIN/NAILS PO TABS Oral Take 1 tablet by mouth daily.      . WOMENS ONE DAILY PO TABS Oral Take 1 tablet by mouth daily.      Marland Kitchen FISH OIL 1200 MG PO CAPS Oral Take 1 capsule by mouth daily.      Marland Kitchen TAMOXIFEN CITRATE 20 MG PO TABS Oral Take 1 tablet (20 mg total) by mouth daily. 90 tablet 2  . VENLAFAXINE HCL 37.5 MG PO TABS Oral Take 37.5 mg by mouth 2 (two) times daily.      BP 163/81  Pulse 95  Temp 98.1 F (36.7 C) (Oral)  Resp 18  SpO2  98%  Physical Exam  Nursing note and vitals reviewed. Constitutional: She is oriented to person, place, and time. She appears well-developed and well-nourished. No distress.       Patient not in visible distress  HENT:  Head: Normocephalic and atraumatic.  Eyes: Conjunctivae and EOM are normal. Pupils are equal, round, and reactive to light.  Neck: Normal range of motion. Neck supple. Normal carotid pulses, no hepatojugular reflux and no JVD present. Carotid bruit is not present.       No spinous process ttp. Pain with ROM. C-collar applied. + muscular ttp.   Cardiovascular:       RRR, no aberrancy on auscultation, intact distal pulses no pitting edema bilaterally.  Pulmonary/Chest: Effort normal and breath sounds normal. No respiratory distress.       Lungs clear to auscultation bilaterally  Abdominal: Soft. She exhibits no distension. There is no tenderness.       Nonpulsatile aorta  Musculoskeletal: Normal range of motion. She exhibits tenderness.       Stockings ripped at knees bilaterally. ttp over patella, no pain with flexion or extension. No pelvic or hip ttp. Pain free inversion and eversion of lower extremities  Neurological: She is alert and oriented to person, place, and time. She displays a negative Romberg sign.       Cranial nerves III through XII intact, normal coordination, negative Romberg, strength 5/5 bilaterally. No ataxia  Skin: Skin is warm and dry. No pallor.  Psychiatric: She has a normal mood and affect. Her behavior is normal.    ED Course  Procedures (including critical care time)  Labs Reviewed - No data to display Ct Head Wo Contrast  05/15/2012  *RADIOLOGY REPORT*  Clinical Data:  Pain, fall  CT HEAD WITHOUT CONTRAST CT CERVICAL SPINE WITHOUT CONTRAST  Technique:  Multidetector CT imaging of the head and cervical spine was performed following the standard protocol without intravenous contrast.  Multiplanar CT image reconstructions of the cervical spine were  also generated.  Comparison:  02/09/2011  CT HEAD  Findings: Mild generalized atrophy. Normal ventricular morphology. No midline shift or mass effect. Otherwise normal appearance of brain parenchyma. No intracranial hemorrhage, mass lesion, or acute infarction. Visualized paranasal sinuses clear. Bones unremarkable.  IMPRESSION: No acute intracranial abnormalities.  CT CERVICAL SPINE  Findings: Visualized skull base intact. Multiple small nodules within bilateral thyroid lobes. Lung apices clear. Prevertebral soft tissues normal thickness. Disc space narrowing with minimal end plate spur formation at C5-C6 and C6-C7, less at C4-C5. Bones  appear demineralized. Vertebral body heights maintained without fracture or subluxation. Mild scattered facet degenerative changes. C1 C2-2 alignment normal.  IMPRESSION: Degenerative disc disease changes of the cervical spine as above. No acute cervical spine abnormalities. Small bilateral thyroid nodules largest 12 mm diameter. Consider further evaluation by non emergent thyroid ultrasound. If patient is clinically hyperthyroid, consider nuclear medicine thyroid uptake and scan.   Original Report Authenticated By: Lollie Marrow, M.D.    Ct Cervical Spine Wo Contrast  05/15/2012  *RADIOLOGY REPORT*  Clinical Data:  Pain, fall  CT HEAD WITHOUT CONTRAST CT CERVICAL SPINE WITHOUT CONTRAST  Technique:  Multidetector CT imaging of the head and cervical spine was performed following the standard protocol without intravenous contrast.  Multiplanar CT image reconstructions of the cervical spine were also generated.  Comparison:  02/09/2011  CT HEAD  Findings: Mild generalized atrophy. Normal ventricular morphology. No midline shift or mass effect. Otherwise normal appearance of brain parenchyma. No intracranial hemorrhage, mass lesion, or acute infarction. Visualized paranasal sinuses clear. Bones unremarkable.  IMPRESSION: No acute intracranial abnormalities.  CT CERVICAL SPINE   Findings: Visualized skull base intact. Multiple small nodules within bilateral thyroid lobes. Lung apices clear. Prevertebral soft tissues normal thickness. Disc space narrowing with minimal end plate spur formation at C5-C6 and C6-C7, less at C4-C5. Bones appear demineralized. Vertebral body heights maintained without fracture or subluxation. Mild scattered facet degenerative changes. C1 C2-2 alignment normal.  IMPRESSION: Degenerative disc disease changes of the cervical spine as above. No acute cervical spine abnormalities. Small bilateral thyroid nodules largest 12 mm diameter. Consider further evaluation by non emergent thyroid ultrasound. If patient is clinically hyperthyroid, consider nuclear medicine thyroid uptake and scan.   Original Report Authenticated By: Lollie Marrow, M.D.    Dg Knee Complete 4 Views Left  05/15/2012  *RADIOLOGY REPORT*  Clinical Data: Larey Seat landing on both knees, anterior knee pain  LEFT KNEE - COMPLETE 4+ VIEW  Comparison: None  Findings: Osseous demineralization. Joint space narrowing at medial compartment and patellofemoral joint. No acute fracture, dislocation, or bone destruction. No knee joint effusion. Minimal soft tissue swelling anterior to patellar tendons.  IMPRESSION: Mild degenerative changes. No acute osseous abnormalities.   Original Report Authenticated By: Lollie Marrow, M.D.    Dg Knee Complete 4 Views Right  05/15/2012  *RADIOLOGY REPORT*  Clinical Data: Larey Seat landing on both knees, anterior knee pain  RIGHT KNEE - COMPLETE 4+ VIEW  Comparison: None  Findings: Diffuse joint space narrowing with subchondral cystic changes at the lateral tibial plateau. Bones appear diffusely demineralized. No acute fracture, dislocation, or bone destruction. No knee joint effusion. Soft tissue swelling anterior to the patellar tendon.  IMPRESSION: Degenerative changes right knee. No acute bony abnormalities.   Original Report Authenticated By: Lollie Marrow, M.D.      No  diagnosis found.    MDM  Fall  Patient X-Ray negative for obvious fracture or dislocation.  Pt advised to follow up with orthopedics if symptoms persist for possibility of missed fracture diagnosis. Patient given brace while in ED, conservative therapy recommended and discussed. Patient will be dc home & is agreeable with above plan. Discussed incidental thyroid nodule findings and advised follow up.           Jaci Carrel, [place] 05/15/12 [phone]

## 2012-05-16 NOTE — ED Provider Notes (Signed)
History/physical exam/procedure(s) were performed by non-physician practitioner and as supervising physician I was immediately available for consultation/collaboration. I have reviewed all notes and am in agreement with care and plan.   Hilario Quarry, MD 05/16/12 [phone]

## 2012-05-28 ENCOUNTER — Telehealth: Payer: Self-pay | Admitting: Oncology

## 2012-05-28 ENCOUNTER — Other Ambulatory Visit (HOSPITAL_BASED_OUTPATIENT_CLINIC_OR_DEPARTMENT_OTHER): Payer: Medicare Other | Admitting: Lab

## 2012-05-28 ENCOUNTER — Ambulatory Visit (HOSPITAL_BASED_OUTPATIENT_CLINIC_OR_DEPARTMENT_OTHER): Payer: Medicare Other | Admitting: Oncology

## 2012-05-28 VITALS — BP 143/70 | HR 81 | Temp 97.5°F | Resp 20 | Ht 62.0 in | Wt 172.5 lb

## 2012-05-28 DIAGNOSIS — C50919 Malignant neoplasm of unspecified site of unspecified female breast: Secondary | ICD-10-CM

## 2012-05-28 DIAGNOSIS — M810 Age-related osteoporosis without current pathological fracture: Secondary | ICD-10-CM

## 2012-05-28 DIAGNOSIS — I10 Essential (primary) hypertension: Secondary | ICD-10-CM

## 2012-05-28 DIAGNOSIS — C50119 Malignant neoplasm of central portion of unspecified female breast: Secondary | ICD-10-CM

## 2012-05-28 DIAGNOSIS — IMO0001 Reserved for inherently not codable concepts without codable children: Secondary | ICD-10-CM

## 2012-05-28 LAB — CBC WITH DIFFERENTIAL/PLATELET
BASO%: 0.4 % (ref 0.0–2.0)
EOS%: 1.3 % (ref 0.0–7.0)
LYMPH%: 28.3 % (ref 14.0–49.7)
MCH: 30.4 pg (ref 25.1–34.0)
MCHC: 32.8 g/dL (ref 31.5–36.0)
MONO#: 0.6 10*3/uL (ref 0.1–0.9)
Platelets: 168 10*3/uL (ref 145–400)
RBC: 3.95 10*6/uL (ref 3.70–5.45)
WBC: 6 10*3/uL (ref 3.9–10.3)
lymph#: 1.7 10*3/uL (ref 0.9–3.3)

## 2012-05-28 LAB — COMPREHENSIVE METABOLIC PANEL (CC13)
ALT: 11 U/L (ref 0–55)
AST: 15 U/L (ref 5–34)
CO2: 26 mEq/L (ref 22–29)
Creatinine: 1 mg/dL (ref 0.6–1.1)
Sodium: 140 mEq/L (ref 136–145)
Total Bilirubin: 0.6 mg/dL (ref 0.20–1.20)
Total Protein: 6.7 g/dL (ref 6.4–8.3)
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
## 2012-05-28 NOTE — Patient Instructions (Signed)
1.  Diagnosis:  History of breast cancer.  2.  Treatment:  Continue adjuvant Tamoxifen until at least 10/2013.  3.  Mammogram due in 08/2012. 4.  Follow up:  In about 8 months.

## 2012-05-28 NOTE — Telephone Encounter (Signed)
Gave pt appt for Mammogram in December 2013 and see MD in May 2014 with labs

## 2012-06-13 ENCOUNTER — Ambulatory Visit (INDEPENDENT_AMBULATORY_CARE_PROVIDER_SITE_OTHER): Payer: Medicare Other | Admitting: Family Medicine

## 2012-06-13 ENCOUNTER — Encounter: Payer: Self-pay | Admitting: Family Medicine

## 2012-06-13 VITALS — BP 140/72 | HR 90 | Temp 98.2°F | Ht 65.0 in | Wt 171.6 lb

## 2012-06-13 DIAGNOSIS — C50919 Malignant neoplasm of unspecified site of unspecified female breast: Secondary | ICD-10-CM

## 2012-06-13 DIAGNOSIS — H539 Unspecified visual disturbance: Secondary | ICD-10-CM

## 2012-06-13 DIAGNOSIS — N390 Urinary tract infection, site not specified: Secondary | ICD-10-CM

## 2012-06-13 DIAGNOSIS — Z23 Encounter for immunization: Secondary | ICD-10-CM

## 2012-06-13 DIAGNOSIS — E041 Nontoxic single thyroid nodule: Secondary | ICD-10-CM

## 2012-06-13 DIAGNOSIS — Z0000 Encounter for general adult medical examination without abnormal findings: Secondary | ICD-10-CM

## 2012-06-13 DIAGNOSIS — M069 Rheumatoid arthritis, unspecified: Secondary | ICD-10-CM

## 2012-06-13 DIAGNOSIS — I10 Essential (primary) hypertension: Secondary | ICD-10-CM

## 2012-06-13 DIAGNOSIS — E785 Hyperlipidemia, unspecified: Secondary | ICD-10-CM

## 2012-06-13 LAB — TSH: TSH: 0.81 u[IU]/mL (ref 0.35–5.50)

## 2012-06-13 LAB — POCT URINALYSIS DIPSTICK
Ketones, UA: NEGATIVE
Protein, UA: 30
Spec Grav, UA: 1.025
Urobilinogen, UA: 0.2
pH, UA: 6

## 2012-06-13 LAB — CBC WITH DIFFERENTIAL/PLATELET
Basophils Absolute: 0 10*3/uL (ref 0.0–0.1)
Eosinophils Absolute: 0.1 10*3/uL (ref 0.0–0.7)
Hemoglobin: 12.4 g/dL (ref 12.0–15.0)
Lymphocytes Relative: 34.5 % (ref 12.0–46.0)
MCHC: 32.1 g/dL (ref 30.0–36.0)
Monocytes Relative: 7.6 % (ref 3.0–12.0)
Neutro Abs: 3.5 10*3/uL (ref 1.4–7.7)
Neutrophils Relative %: 56.3 % (ref 43.0–77.0)
Platelets: 174 10*3/uL (ref 150.0–400.0)
RDW: 15.6 % — ABNORMAL HIGH (ref 11.5–14.6)

## 2012-06-13 LAB — BASIC METABOLIC PANEL
BUN: 18 mg/dL (ref 6–23)
CO2: 25 mEq/L (ref 19–32)
Calcium: 9.3 mg/dL (ref 8.4–10.5)
Creatinine, Ser: 0.9 mg/dL (ref 0.4–1.2)
GFR: 73.35 mL/min (ref >60.00)
Glucose, Bld: 88 mg/dL (ref 70–99)
Sodium: 141 mEq/L (ref 135–145)

## 2012-06-13 LAB — HEPATIC FUNCTION PANEL
AST: 21 U/L (ref 0–37)
Albumin: 3.8 g/dL (ref 3.5–5.2)
Alkaline Phosphatase: 41 U/L (ref 39–117)
Total Bilirubin: 0.6 mg/dL (ref 0.3–1.2)

## 2012-06-13 LAB — LIPID PANEL
HDL: 44.4 mg/dL (ref >39.00)
LDL Cholesterol: 116 mg/dL — ABNORMAL HIGH (ref 0–99)
VLDL: 22 mg/dL (ref 0.0–40.0)

## 2012-06-13 MED ORDER — ZOSTER VACCINE LIVE 19400 UNT/0.65ML ~~LOC~~ SOLR: 0.6500 mL | Freq: Once | SUBCUTANEOUS | Status: DC

## 2012-06-13 MED ORDER — AMLODIPINE BESYLATE 5 MG PO TABS: 5.0000 mg | ORAL_TABLET | Freq: Every day | ORAL | Status: DC

## 2012-06-13 NOTE — Assessment & Plan Note (Signed)
Per oncology °

## 2012-06-13 NOTE — Patient Instructions (Signed)
Preventive Care for Adults, Female A healthy lifestyle and preventive care can promote health and wellness. Preventive health guidelines for women include the following key practices.  A routine yearly physical is a good way to check with your caregiver about your health and preventive screening. It is a chance to share any concerns and updates on your health, and to receive a thorough exam.  Visit your dentist for a routine exam and preventive care every 6 months. Brush your teeth twice a day and floss once a day. Good oral hygiene prevents tooth decay and gum disease.  The frequency of eye exams is based on your age, health, family medical history, use of contact lenses, and other factors. Follow your caregiver's recommendations for frequency of eye exams.  Eat a healthy diet. Foods like vegetables, fruits, whole grains, low-fat dairy products, and lean protein foods contain the nutrients you need without too many calories. Decrease your intake of foods high in solid fats, added sugars, and salt. Eat the right amount of calories for you.Get information about a proper diet from your caregiver, if necessary.  Regular physical exercise is one of the most important things you can do for your health. Most adults should get at least 150 minutes of moderate-intensity exercise (any activity that increases your heart rate and causes you to sweat) each week. In addition, most adults need muscle-strengthening exercises on 2 or more days a week.  Maintain a healthy weight. The body mass index (BMI) is a screening tool to identify possible weight problems. It provides an estimate of body fat based on height and weight. Your caregiver can help determine your BMI, and can help you achieve or maintain a healthy weight.For adults 20 years and older:  A BMI below 18.5 is considered underweight.  A BMI of 18.5 to 24.9 is normal.  A BMI of 25 to 29.9 is considered overweight.  A BMI of 30 and above is  considered obese.  Maintain normal blood lipids and cholesterol levels by exercising and minimizing your intake of saturated fat. Eat a balanced diet with plenty of fruit and vegetables. Blood tests for lipids and cholesterol should begin at age 20 and be repeated every 5 years. If your lipid or cholesterol levels are high, you are over 50, or you are at high risk for heart disease, you may need your cholesterol levels checked more frequently.Ongoing high lipid and cholesterol levels should be treated with medicines if diet and exercise are not effective.  If you smoke, find out from your caregiver how to quit. If you do not use tobacco, do not start.  If you are pregnant, do not drink alcohol. If you are breastfeeding, be very cautious about drinking alcohol. If you are not pregnant and choose to drink alcohol, do not exceed 1 drink per day. One drink is considered to be 12 ounces (355 mL) of beer, 5 ounces (148 mL) of wine, or 1.5 ounces (44 mL) of liquor.  Avoid use of street drugs. Do not share needles with anyone. Ask for help if you need support or instructions about stopping the use of drugs.  High blood pressure causes heart disease and increases the risk of stroke. Your blood pressure should be checked at least every 1 to 2 years. Ongoing high blood pressure should be treated with medicines if weight loss and exercise are not effective.  If you are 55 to 76 years old, ask your caregiver if you should take aspirin to prevent strokes.  Diabetes   screening involves taking a blood sample to check your fasting blood sugar level. This should be done once every 3 years, after age 45, if you are within normal weight and without risk factors for diabetes. Testing should be considered at a younger age or be carried out more frequently if you are overweight and have at least 1 risk factor for diabetes.  Breast cancer screening is essential preventive care for women. You should practice "breast  self-awareness." This means understanding the normal appearance and feel of your breasts and may include breast self-examination. Any changes detected, no matter how small, should be reported to a caregiver. Women in their 20s and 30s should have a clinical breast exam (CBE) by a caregiver as part of a regular health exam every 1 to 3 years. After age 40, women should have a CBE every year. Starting at age 40, women should consider having a mammography (breast X-ray test) every year. Women who have a family history of breast cancer should talk to their caregiver about genetic screening. Women at a high risk of breast cancer should talk to their caregivers about having magnetic resonance imaging (MRI) and a mammography every year.  The Pap test is a screening test for cervical cancer. A Pap test can show cell changes on the cervix that might become cervical cancer if left untreated. A Pap test is a procedure in which cells are obtained and examined from the lower end of the uterus (cervix).  Women should have a Pap test starting at age 21.  Between ages 21 and 29, Pap tests should be repeated every 2 years.  Beginning at age 30, you should have a Pap test every 3 years as long as the past 3 Pap tests have been normal.  Some women have medical problems that increase the chance of getting cervical cancer. Talk to your caregiver about these problems. It is especially important to talk to your caregiver if a new problem develops soon after your last Pap test. In these cases, your caregiver may recommend more frequent screening and Pap tests.  The above recommendations are the same for women who have or have not gotten the vaccine for human papillomavirus (HPV).  If you had a hysterectomy for a problem that was not cancer or a condition that could lead to cancer, then you no longer need Pap tests. Even if you no longer need a Pap test, a regular exam is a good idea to make sure no other problems are  starting.  If you are between ages 65 and 70, and you have had normal Pap tests going back 10 years, you no longer need Pap tests. Even if you no longer need a Pap test, a regular exam is a good idea to make sure no other problems are starting.  If you have had past treatment for cervical cancer or a condition that could lead to cancer, you need Pap tests and screening for cancer for at least 20 years after your treatment.  If Pap tests have been discontinued, risk factors (such as a new sexual partner) need to be reassessed to determine if screening should be resumed.  The HPV test is an additional test that may be used for cervical cancer screening. The HPV test looks for the virus that can cause the cell changes on the cervix. The cells collected during the Pap test can be tested for HPV. The HPV test could be used to screen women aged 30 years and older, and should   be used in women of any age who have unclear Pap test results. After the age of 30, women should have HPV testing at the same frequency as a Pap test.  Colorectal cancer can be detected and often prevented. Most routine colorectal cancer screening begins at the age of 50 and continues through age 75. However, your caregiver may recommend screening at an earlier age if you have risk factors for colon cancer. On a yearly basis, your caregiver may provide home test kits to check for hidden blood in the stool. Use of a small camera at the end of a tube, to directly examine the colon (sigmoidoscopy or colonoscopy), can detect the earliest forms of colorectal cancer. Talk to your caregiver about this at age 50, when routine screening begins. Direct examination of the colon should be repeated every 5 to 10 years through age 75, unless early forms of pre-cancerous polyps or small growths are found.  Hepatitis C blood testing is recommended for all people born from 1945 through 1965 and any individual with known risks for hepatitis C.  Practice  safe sex. Use condoms and avoid high-risk sexual practices to reduce the spread of sexually transmitted infections (STIs). STIs include gonorrhea, chlamydia, syphilis, trichomonas, herpes, HPV, and human immunodeficiency virus (HIV). Herpes, HIV, and HPV are viral illnesses that have no cure. They can result in disability, cancer, and death. Sexually active women aged 25 and younger should be checked for chlamydia. Older women with new or multiple partners should also be tested for chlamydia. Testing for other STIs is recommended if you are sexually active and at increased risk.  Osteoporosis is a disease in which the bones lose minerals and strength with aging. This can result in serious bone fractures. The risk of osteoporosis can be identified using a bone density scan. Women ages 65 and over and women at risk for fractures or osteoporosis should discuss screening with their caregivers. Ask your caregiver whether you should take a calcium supplement or vitamin D to reduce the rate of osteoporosis.  Menopause can be associated with physical symptoms and risks. Hormone replacement therapy is available to decrease symptoms and risks. You should talk to your caregiver about whether hormone replacement therapy is right for you.  Use sunscreen with sun protection factor (SPF) of 30 or more. Apply sunscreen liberally and repeatedly throughout the day. You should seek shade when your shadow is shorter than you. Protect yourself by wearing long sleeves, pants, a wide-brimmed hat, and sunglasses year round, whenever you are outdoors.  Once a month, do a whole body skin exam, using a mirror to look at the skin on your back. Notify your caregiver of new moles, moles that have irregular borders, moles that are larger than a pencil eraser, or moles that have changed in shape or color.  Stay current with required immunizations.  Influenza. You need a dose every fall (or winter). The composition of the flu vaccine  changes each year, so being vaccinated once is not enough.  Pneumococcal polysaccharide. You need 1 to 2 doses if you smoke cigarettes or if you have certain chronic medical conditions. You need 1 dose at age 65 (or older) if you have never been vaccinated.  Tetanus, diphtheria, pertussis (Tdap, Td). Get 1 dose of Tdap vaccine if you are younger than age 65, are over 65 and have contact with an infant, are a healthcare worker, are pregnant, or simply want to be protected from whooping cough. After that, you need a Td   booster dose every 10 years. Consult your caregiver if you have not had at least 3 tetanus and diphtheria-containing shots sometime in your life or have a deep or dirty wound.  HPV. You need this vaccine if you are a woman age 26 or younger. The vaccine is given in 3 doses over 6 months.  Measles, mumps, rubella (MMR). You need at least 1 dose of MMR if you were born in 1957 or later. You may also need a second dose.  Meningococcal. If you are age 19 to 21 and a first-year college student living in a residence hall, or have one of several medical conditions, you need to get vaccinated against meningococcal disease. You may also need additional booster doses.  Zoster (shingles). If you are age 60 or older, you should get this vaccine.  Varicella (chickenpox). If you have never had chickenpox or you were vaccinated but received only 1 dose, talk to your caregiver to find out if you need this vaccine.  Hepatitis A. You need this vaccine if you have a specific risk factor for hepatitis A virus infection or you simply wish to be protected from this disease. The vaccine is usually given as 2 doses, 6 to 18 months apart.  Hepatitis B. You need this vaccine if you have a specific risk factor for hepatitis B virus infection or you simply wish to be protected from this disease. The vaccine is given in 3 doses, usually over 6 months. Preventive Services / Frequency Ages 19 to 39  Blood  pressure check.** / Every 1 to 2 years.  Lipid and cholesterol check.** / Every 5 years beginning at age 20.  Clinical breast exam.** / Every 3 years for women in their 20s and 30s.  Pap test.** / Every 2 years from ages 21 through 29. Every 3 years starting at age 30 through age 65 or 70 with a history of 3 consecutive normal Pap tests.  HPV screening.** / Every 3 years from ages 30 through ages 65 to 70 with a history of 3 consecutive normal Pap tests.  Hepatitis C blood test.** / For any individual with known risks for hepatitis C.  Skin self-exam. / Monthly.  Influenza immunization.** / Every year.  Pneumococcal polysaccharide immunization.** / 1 to 2 doses if you smoke cigarettes or if you have certain chronic medical conditions.  Tetanus, diphtheria, pertussis (Tdap, Td) immunization. / A one-time dose of Tdap vaccine. After that, you need a Td booster dose every 10 years.  HPV immunization. / 3 doses over 6 months, if you are 26 and younger.  Measles, mumps, rubella (MMR) immunization. / You need at least 1 dose of MMR if you were born in 1957 or later. You may also need a second dose.  Meningococcal immunization. / 1 dose if you are age 19 to 21 and a first-year college student living in a residence hall, or have one of several medical conditions, you need to get vaccinated against meningococcal disease. You may also need additional booster doses.  Varicella immunization.** / Consult your caregiver.  Hepatitis A immunization.** / Consult your caregiver. 2 doses, 6 to 18 months apart.  Hepatitis B immunization.** / Consult your caregiver. 3 doses usually over 6 months. Ages 40 to 64  Blood pressure check.** / Every 1 to 2 years.  Lipid and cholesterol check.** / Every 5 years beginning at age 20.  Clinical breast exam.** / Every year after age 40.  Mammogram.** / Every year beginning at age 40   and continuing for as long as you are in good health. Consult with your  caregiver.  Pap test.** / Every 3 years starting at age 30 through age 65 or 70 with a history of 3 consecutive normal Pap tests.  HPV screening.** / Every 3 years from ages 30 through ages 65 to 70 with a history of 3 consecutive normal Pap tests.  Fecal occult blood test (FOBT) of stool. / Every year beginning at age 50 and continuing until age 75. You may not need to do this test if you get a colonoscopy every 10 years.  Flexible sigmoidoscopy or colonoscopy.** / Every 5 years for a flexible sigmoidoscopy or every 10 years for a colonoscopy beginning at age 50 and continuing until age 75.  Hepatitis C blood test.** / For all people born from 1945 through 1965 and any individual with known risks for hepatitis C.  Skin self-exam. / Monthly.  Influenza immunization.** / Every year.  Pneumococcal polysaccharide immunization.** / 1 to 2 doses if you smoke cigarettes or if you have certain chronic medical conditions.  Tetanus, diphtheria, pertussis (Tdap, Td) immunization.** / A one-time dose of Tdap vaccine. After that, you need a Td booster dose every 10 years.  Measles, mumps, rubella (MMR) immunization. / You need at least 1 dose of MMR if you were born in 1957 or later. You may also need a second dose.  Varicella immunization.** / Consult your caregiver.  Meningococcal immunization.** / Consult your caregiver.  Hepatitis A immunization.** / Consult your caregiver. 2 doses, 6 to 18 months apart.  Hepatitis B immunization.** / Consult your caregiver. 3 doses, usually over 6 months. Ages 65 and over  Blood pressure check.** / Every 1 to 2 years.  Lipid and cholesterol check.** / Every 5 years beginning at age 20.  Clinical breast exam.** / Every year after age 40.  Mammogram.** / Every year beginning at age 40 and continuing for as long as you are in good health. Consult with your caregiver.  Pap test.** / Every 3 years starting at age 30 through age 65 or 70 with a 3  consecutive normal Pap tests. Testing can be stopped between 65 and 70 with 3 consecutive normal Pap tests and no abnormal Pap or HPV tests in the past 10 years.  HPV screening.** / Every 3 years from ages 30 through ages 65 or 70 with a history of 3 consecutive normal Pap tests. Testing can be stopped between 65 and 70 with 3 consecutive normal Pap tests and no abnormal Pap or HPV tests in the past 10 years.  Fecal occult blood test (FOBT) of stool. / Every year beginning at age 50 and continuing until age 75. You may not need to do this test if you get a colonoscopy every 10 years.  Flexible sigmoidoscopy or colonoscopy.** / Every 5 years for a flexible sigmoidoscopy or every 10 years for a colonoscopy beginning at age 50 and continuing until age 75.  Hepatitis C blood test.** / For all people born from 1945 through 1965 and any individual with known risks for hepatitis C.  Osteoporosis screening.** / A one-time screening for women ages 65 and over and women at risk for fractures or osteoporosis.  Skin self-exam. / Monthly.  Influenza immunization.** / Every year.  Pneumococcal polysaccharide immunization.** / 1 dose at age 65 (or older) if you have never been vaccinated.  Tetanus, diphtheria, pertussis (Tdap, Td) immunization. / A one-time dose of Tdap vaccine if you are over   65 and have contact with an infant, are a healthcare worker, or simply want to be protected from whooping cough. After that, you need a Td booster dose every 10 years.  Varicella immunization.** / Consult your caregiver.  Meningococcal immunization.** / Consult your caregiver.  Hepatitis A immunization.** / Consult your caregiver. 2 doses, 6 to 18 months apart.  Hepatitis B immunization.** / Check with your caregiver. 3 doses, usually over 6 months. ** Family history and personal history of risk and conditions may change your caregiver's recommendations. Document Released: 10/23/2001 Document Revised: 11/19/2011  Document Reviewed: 01/22/2011 [employer] Patient Information 2013 [employer].  

## 2012-06-13 NOTE — Assessment & Plan Note (Signed)
Check labs 

## 2012-06-13 NOTE — Assessment & Plan Note (Signed)
Stable con't meds 
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
## 2012-06-13 NOTE — Assessment & Plan Note (Signed)
stable °

## 2012-06-16 ENCOUNTER — Other Ambulatory Visit: Payer: Self-pay | Admitting: Family Medicine

## 2012-06-16 DIAGNOSIS — N390 Urinary tract infection, site not specified: Secondary | ICD-10-CM

## 2012-06-16 LAB — URINE CULTURE: Colony Count: >=100000

## 2012-06-16 MED ORDER — NITROFURANTOIN MONOHYD MACRO 100 MG PO CAPS: 100.0000 mg | ORAL_CAPSULE | Freq: Two times a day (BID) | ORAL | Status: DC

## 2012-06-17 ENCOUNTER — Encounter: Payer: Self-pay | Admitting: Family Medicine

## 2012-06-17 ENCOUNTER — Other Ambulatory Visit: Payer: Self-pay

## 2012-06-19 DIAGNOSIS — R519 Headache, unspecified: Secondary | ICD-10-CM | POA: Insufficient documentation

## 2012-06-19 DIAGNOSIS — F419 Anxiety disorder, unspecified: Secondary | ICD-10-CM | POA: Insufficient documentation

## 2012-06-19 DIAGNOSIS — M1990 Unspecified osteoarthritis, unspecified site: Secondary | ICD-10-CM | POA: Insufficient documentation

## 2012-06-19 DIAGNOSIS — K219 Gastro-esophageal reflux disease without esophagitis: Secondary | ICD-10-CM | POA: Insufficient documentation

## 2012-06-19 DIAGNOSIS — N8189 Other female genital prolapse: Secondary | ICD-10-CM | POA: Insufficient documentation

## 2012-06-19 DIAGNOSIS — N814 Uterovaginal prolapse, unspecified: Secondary | ICD-10-CM | POA: Insufficient documentation

## 2012-06-19 DIAGNOSIS — IMO0002 Reserved for concepts with insufficient information to code with codable children: Secondary | ICD-10-CM | POA: Insufficient documentation

## 2012-06-19 DIAGNOSIS — F329 Major depressive disorder, single episode, unspecified: Secondary | ICD-10-CM | POA: Insufficient documentation

## 2012-06-19 DIAGNOSIS — Z8639 Personal history of other endocrine, nutritional and metabolic disease: Secondary | ICD-10-CM | POA: Insufficient documentation

## 2012-06-19 DIAGNOSIS — M810 Age-related osteoporosis without current pathological fracture: Secondary | ICD-10-CM | POA: Insufficient documentation

## 2012-06-19 DIAGNOSIS — K5790 Diverticulosis of intestine, part unspecified, without perforation or abscess without bleeding: Secondary | ICD-10-CM | POA: Insufficient documentation

## 2012-06-20 ENCOUNTER — Ambulatory Visit (HOSPITAL_BASED_OUTPATIENT_CLINIC_OR_DEPARTMENT_OTHER): Payer: Medicare Other

## 2012-06-25 ENCOUNTER — Encounter: Payer: Self-pay | Admitting: Obstetrics and Gynecology

## 2012-06-27 ENCOUNTER — Telehealth: Payer: Self-pay

## 2012-06-27 ENCOUNTER — Ambulatory Visit (HOSPITAL_BASED_OUTPATIENT_CLINIC_OR_DEPARTMENT_OTHER): Admission: RE | Admit: 2012-06-27 | Payer: Medicare Other | Source: Ambulatory Visit

## 2012-06-27 ENCOUNTER — Encounter: Payer: Self-pay | Admitting: Family Medicine

## 2012-06-27 NOTE — Telephone Encounter (Signed)
I have left patient 2 messages to return my call prior to today, and I have just left my 3rd message for her to return my call.

## 2012-06-27 NOTE — Telephone Encounter (Signed)
Okey Regal from [facility] called to advise Pt was a no call no show. Okey Regal stated she called pt to reminder her of the [place] appt. Okey Regal also stated she is going to cancel it in the computer.    MW

## 2012-07-03 ENCOUNTER — Ambulatory Visit (INDEPENDENT_AMBULATORY_CARE_PROVIDER_SITE_OTHER): Payer: Medicare Other | Admitting: Obstetrics and Gynecology

## 2012-07-03 ENCOUNTER — Encounter: Payer: Self-pay | Admitting: Obstetrics and Gynecology

## 2012-07-03 VITALS — BP 130/72 | Resp 16 | Ht 62.5 in | Wt 167.0 lb

## 2012-07-03 DIAGNOSIS — M810 Age-related osteoporosis without current pathological fracture: Secondary | ICD-10-CM

## 2012-07-03 DIAGNOSIS — N390 Urinary tract infection, site not specified: Secondary | ICD-10-CM

## 2012-07-03 DIAGNOSIS — Z719 Counseling, unspecified: Secondary | ICD-10-CM

## 2012-07-03 DIAGNOSIS — IMO0002 Reserved for concepts with insufficient information to code with codable children: Secondary | ICD-10-CM

## 2012-07-03 DIAGNOSIS — N8189 Other female genital prolapse: Secondary | ICD-10-CM

## 2012-07-03 DIAGNOSIS — N8111 Cystocele, midline: Secondary | ICD-10-CM

## 2012-07-03 DIAGNOSIS — R350 Frequency of micturition: Secondary | ICD-10-CM

## 2012-07-03 DIAGNOSIS — R319 Hematuria, unspecified: Secondary | ICD-10-CM

## 2012-07-03 LAB — POCT URINALYSIS DIPSTICK
Glucose, UA: NEGATIVE
Leukocytes, UA: NEGATIVE
Urobilinogen, UA: NEGATIVE
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
## 2012-07-04 ENCOUNTER — Other Ambulatory Visit: Payer: Self-pay | Admitting: Family Medicine

## 2012-07-04 ENCOUNTER — Ambulatory Visit: Payer: Medicare Other

## 2012-07-04 ENCOUNTER — Ambulatory Visit (HOSPITAL_BASED_OUTPATIENT_CLINIC_OR_DEPARTMENT_OTHER)
Admission: RE | Admit: 2012-07-04 | Discharge: 2012-07-04 | Disposition: A | Discharge status: Home / Self Care | Payer: Medicare Other | Source: Ambulatory Visit | Attending: Family Medicine | Admitting: Family Medicine

## 2012-07-04 DIAGNOSIS — E041 Nontoxic single thyroid nodule: Secondary | ICD-10-CM

## 2012-07-04 NOTE — Addendum Note (Signed)
Addended by: Marla Roe A on: 07/04/2012 09:31 AM   Modules accepted: Orders

## 2012-07-05 LAB — URINE CULTURE: Colony Count: 6000

## 2012-07-08 ENCOUNTER — Telehealth: Payer: Self-pay

## 2012-07-08 DIAGNOSIS — E079 Disorder of thyroid, unspecified: Secondary | ICD-10-CM

## 2012-07-08 NOTE — Telephone Encounter (Signed)
Message copied by Arnette Norris on Tue Jul 08, 2012  5:31 PM ------      Message from: Lelon Perla      Created: Fri Jul 04, 2012  3:08 PM       + ? Lesion on thyroid-- bx recommended-- refer to ENT for evaluation and bx

## 2012-07-08 NOTE — Telephone Encounter (Signed)
Msg left to call the office     KP 

## 2012-07-09 NOTE — Telephone Encounter (Signed)
has ENT appt Friday wt/Dr Beverlee Nims ph# [phone]/Fax [phone]--Please send OV notes etc

## 2012-07-09 NOTE — Telephone Encounter (Signed)
Patient is aware of her results and has her apt information and notes have been sent.     KP

## 2012-07-17 ENCOUNTER — Telehealth: Payer: Self-pay

## 2012-07-17 NOTE — Telephone Encounter (Signed)
Tc to pt per vph recs. Appt sched 11/19/@4 :30 for follow up. Pt agrees.

## 2012-07-17 NOTE — Telephone Encounter (Signed)
Message copied by Raylene Everts on Thu Jul 17, 2012  9:52 AM ------      Message from: Dierdre Forth P      Created: Wed Jul 16, 2012  5:52 PM      Regarding: make appt       For followupof DXA and osteoporosis.  First available.  No emergency

## 2012-07-25 ENCOUNTER — Other Ambulatory Visit: Payer: Self-pay | Admitting: Otolaryngology

## 2012-07-25 DIAGNOSIS — E041 Nontoxic single thyroid nodule: Secondary | ICD-10-CM

## 2012-07-29 ENCOUNTER — Ambulatory Visit (INDEPENDENT_AMBULATORY_CARE_PROVIDER_SITE_OTHER): Payer: Medicare Other | Admitting: Obstetrics and Gynecology

## 2012-07-29 ENCOUNTER — Encounter: Payer: Self-pay | Admitting: Obstetrics and Gynecology

## 2012-07-29 VITALS — BP 120/78 | Wt 173.0 lb

## 2012-07-29 DIAGNOSIS — C50919 Malignant neoplasm of unspecified site of unspecified female breast: Secondary | ICD-10-CM

## 2012-07-29 DIAGNOSIS — Z719 Counseling, unspecified: Secondary | ICD-10-CM

## 2012-07-29 DIAGNOSIS — M810 Age-related osteoporosis without current pathological fracture: Secondary | ICD-10-CM

## 2012-07-29 DIAGNOSIS — K219 Gastro-esophageal reflux disease without esophagitis: Secondary | ICD-10-CM
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
## 2012-07-31 ENCOUNTER — Ambulatory Visit
Admission: RE | Admit: 2012-07-31 | Discharge: 2012-07-31 | Disposition: A | Discharge status: Home / Self Care | Payer: Medicare Other | Source: Ambulatory Visit | Attending: Otolaryngology | Admitting: Otolaryngology

## 2012-07-31 ENCOUNTER — Other Ambulatory Visit (HOSPITAL_COMMUNITY)
Admission: RE | Admit: 2012-07-31 | Discharge: 2012-07-31 | Disposition: A | Discharge status: Home / Self Care | Payer: Medicare Other | Source: Ambulatory Visit | Attending: Interventional Radiology | Admitting: Interventional Radiology

## 2012-07-31 DIAGNOSIS — E049 Nontoxic goiter, unspecified: Secondary | ICD-10-CM | POA: Insufficient documentation

## 2012-07-31 DIAGNOSIS — E041 Nontoxic single thyroid nodule: Secondary | ICD-10-CM

## 2012-08-15 ENCOUNTER — Ambulatory Visit
Admission: RE | Admit: 2012-08-15 | Discharge: 2012-08-15 | Disposition: A | Discharge status: Home / Self Care | Payer: Medicare Other | Source: Ambulatory Visit | Attending: Oncology | Admitting: Oncology

## 2012-08-15 DIAGNOSIS — C50919 Malignant neoplasm of unspecified site of unspecified female breast: Secondary | ICD-10-CM

## 2012-08-27 ENCOUNTER — Encounter: Payer: Self-pay | Admitting: Obstetrics and Gynecology

## 2012-08-27 ENCOUNTER — Ambulatory Visit (INDEPENDENT_AMBULATORY_CARE_PROVIDER_SITE_OTHER): Payer: Medicare Other | Admitting: Obstetrics and Gynecology

## 2012-08-27 VITALS — BP 120/74 | Wt 175.0 lb

## 2012-08-27 DIAGNOSIS — K219 Gastro-esophageal reflux disease without esophagitis: Secondary | ICD-10-CM

## 2012-08-27 DIAGNOSIS — M810 Age-related osteoporosis without current pathological fracture: Secondary | ICD-10-CM

## 2012-08-27 DIAGNOSIS — E559 Vitamin D deficiency, unspecified: Secondary | ICD-10-CM
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
## 2012-08-27 NOTE — Patient Instructions (Signed)
Will mail patient information on Osteoporosis from Up To Date

## 2012-08-28 ENCOUNTER — Other Ambulatory Visit: Payer: Self-pay

## 2012-08-28 DIAGNOSIS — M810 Age-related osteoporosis without current pathological fracture: Secondary | ICD-10-CM

## 2012-09-10 DIAGNOSIS — N390 Urinary tract infection, site not specified: Secondary | ICD-10-CM

## 2012-09-10 HISTORY — DX: Urinary tract infection, site not specified: N39.0

## 2012-09-21 ENCOUNTER — Other Ambulatory Visit: Payer: Self-pay | Admitting: Oncology

## 2012-09-25 ENCOUNTER — Telehealth: Payer: Self-pay | Admitting: Obstetrics and Gynecology

## 2012-09-25 NOTE — Telephone Encounter (Signed)
Lm on vm to cb per telephone call.  

## 2012-09-25 NOTE — Telephone Encounter (Signed)
vph pt 

## 2012-09-26 NOTE — Telephone Encounter (Signed)
Tc to pt per telephone call. Pt aware referral updated.

## 2012-10-15 ENCOUNTER — Telehealth: Payer: Self-pay

## 2012-10-15 ENCOUNTER — Ambulatory Visit
Payer: Medicare Other | Attending: Obstetrics and Gynecology | Admitting: Rehabilitative and Restorative Service Providers"

## 2012-10-15 NOTE — Telephone Encounter (Signed)
Pc from [facility]). Pt came in for appt today for PT and pt stated",already seeing a PT named Art". Pt opts to keep PT sessions with current therapist. Will make [facility] aware.

## 2012-12-30 ENCOUNTER — Telehealth: Payer: Self-pay | Admitting: Oncology

## 2013-01-26 ENCOUNTER — Ambulatory Visit: Payer: Medicare Other | Admitting: Oncology

## 2013-01-26 ENCOUNTER — Other Ambulatory Visit: Payer: Medicare Other | Admitting: Lab

## 2013-02-04 ENCOUNTER — Telehealth: Payer: Self-pay | Admitting: Oncology

## 2013-02-04 ENCOUNTER — Telehealth: Payer: Self-pay | Admitting: Internal Medicine

## 2013-02-04 ENCOUNTER — Other Ambulatory Visit (HOSPITAL_BASED_OUTPATIENT_CLINIC_OR_DEPARTMENT_OTHER): Payer: Medicare Other | Admitting: Lab

## 2013-02-04 ENCOUNTER — Ambulatory Visit (HOSPITAL_BASED_OUTPATIENT_CLINIC_OR_DEPARTMENT_OTHER): Payer: Medicare Other | Admitting: Oncology

## 2013-02-04 VITALS — BP 139/80 | HR 90 | Temp 97.8°F | Resp 19 | Ht 62.5 in | Wt 170.6 lb

## 2013-02-04 DIAGNOSIS — I10 Essential (primary) hypertension: Secondary | ICD-10-CM

## 2013-02-04 DIAGNOSIS — C50919 Malignant neoplasm of unspecified site of unspecified female breast: Secondary | ICD-10-CM

## 2013-02-04 DIAGNOSIS — M810 Age-related osteoporosis without current pathological fracture: Secondary | ICD-10-CM

## 2013-02-04 DIAGNOSIS — IMO0001 Reserved for inherently not codable concepts without codable children: Secondary | ICD-10-CM

## 2013-02-04 DIAGNOSIS — C50219 Malignant neoplasm of upper-inner quadrant of unspecified female breast: Secondary | ICD-10-CM

## 2013-02-04 LAB — CBC WITH DIFFERENTIAL/PLATELET
Basophils Absolute: 0 10*3/uL (ref 0.0–0.1)
EOS%: 1.3 % (ref 0.0–7.0)
Eosinophils Absolute: 0.1 10*3/uL (ref 0.0–0.5)
HCT: 36.9 % (ref 34.8–46.6)
HGB: 12.3 g/dL (ref 11.6–15.9)
MCH: 30.1 pg (ref 25.1–34.0)
NEUT#: 4.9 10*3/uL (ref 1.5–6.5)
NEUT%: 62.4 % (ref 38.4–76.8)
lymph#: 2.2 10*3/uL (ref 0.9–3.3)

## 2013-02-04 LAB — COMPREHENSIVE METABOLIC PANEL (CC13)
AST: 18 U/L (ref 5–34)
Albumin: 3.5 g/dL (ref 3.5–5.0)
BUN: 9.3 mg/dL (ref 7.0–26.0)
CO2: 26 mEq/L (ref 22–29)
Calcium: 9.3 mg/dL (ref 8.4–10.4)
Chloride: 107 mEq/L (ref 98–107)
Creatinine: 0.9 mg/dL (ref 0.6–1.1)
Glucose: 94 mg/dl (ref 70–99)
Potassium: 3.9 mEq/L (ref 3.5–5.1)

## 2013-02-04 NOTE — Telephone Encounter (Signed)
gv and printed appt sched and avs for pt  °
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
## 2013-02-04 NOTE — Telephone Encounter (Signed)
lvm for pt regarding to being sched with Dr. Welton Flakes.Marland KitchenMarland KitchenDone

## 2013-03-17 ENCOUNTER — Telehealth: Payer: Self-pay | Admitting: *Deleted

## 2013-03-17 NOTE — Telephone Encounter (Signed)
Pt called to make sure she was on Dr. Milta Deiters schedule and I informed her, yes for Jan. 2015.  Pt said ok that's great.  Mailed her calendar to have for her records.

## 2013-03-20 ENCOUNTER — Other Ambulatory Visit: Payer: Self-pay | Admitting: Oncology

## 2013-04-23 ENCOUNTER — Ambulatory Visit: Payer: Medicare Other | Admitting: Radiation Oncology

## 2013-05-07 ENCOUNTER — Ambulatory Visit: Payer: Medicare Other | Admitting: Radiation Oncology

## 2013-05-21 ENCOUNTER — Encounter: Payer: Self-pay | Admitting: Radiation Oncology

## 2013-05-21 ENCOUNTER — Ambulatory Visit
Admission: RE | Admit: 2013-05-21 | Discharge: 2013-05-21 | Disposition: A | Discharge status: Home / Self Care | Payer: Medicare Other | Source: Ambulatory Visit | Attending: Radiation Oncology | Admitting: Radiation Oncology

## 2013-05-21 VITALS — BP 143/80 | HR 92 | Temp 98.3°F | Resp 20 | Wt 171.0 lb

## 2013-05-21 DIAGNOSIS — C50919 Malignant neoplasm of unspecified site of unspecified female breast: Secondary | ICD-10-CM
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
## 2013-06-15 ENCOUNTER — Other Ambulatory Visit: Payer: Self-pay | Admitting: Oncology

## 2013-06-15 ENCOUNTER — Other Ambulatory Visit: Payer: Self-pay | Admitting: Family Medicine

## 2013-06-15 DIAGNOSIS — I10 Essential (primary) hypertension: Secondary | ICD-10-CM

## 2013-06-15 NOTE — Telephone Encounter (Signed)
Refill for norvasc sent to [facility]

## 2013-08-19 ENCOUNTER — Ambulatory Visit
Admission: RE | Admit: 2013-08-19 | Discharge: 2013-08-19 | Disposition: A | Discharge status: Home / Self Care | Payer: Medicare Other | Source: Ambulatory Visit | Attending: Oncology | Admitting: Oncology

## 2013-08-19 DIAGNOSIS — C50919 Malignant neoplasm of unspecified site of unspecified female breast: Secondary | ICD-10-CM

## 2013-09-16 ENCOUNTER — Other Ambulatory Visit: Payer: Self-pay | Admitting: Oncology

## 2013-09-18 ENCOUNTER — Telehealth: Payer: Self-pay | Admitting: *Deleted

## 2013-09-18 NOTE — Telephone Encounter (Signed)
PT. STATES DR.HA TOLD HER THAT SHE WOULD BE FINISHED WITH THE TAMOXIFEN ON 09/24/13. PT. HAS ONE TABLET LEFT. DOES SHE NEED TO GET A REFILL ON THE TAMOXIFEN? SPOKE WITH DR.KHAN'S NURSE, AMY MITCHELL,RN. NOTIFIED PT. THAT SHE DOES NOT NEED TO REFILL THE TAMOXIFEN AND TO SEE DR.KHAN ON 10/07/13. PT. VOICES UNDERSTANDING.

## 2013-10-07 ENCOUNTER — Ambulatory Visit: Payer: Medicare Other | Admitting: Oncology

## 2013-10-07 ENCOUNTER — Encounter: Payer: Self-pay | Admitting: Oncology

## 2013-10-07 ENCOUNTER — Ambulatory Visit (HOSPITAL_BASED_OUTPATIENT_CLINIC_OR_DEPARTMENT_OTHER): Payer: Medicare Other | Admitting: Oncology

## 2013-10-07 ENCOUNTER — Telehealth: Payer: Self-pay | Admitting: Oncology

## 2013-10-07 ENCOUNTER — Other Ambulatory Visit (HOSPITAL_BASED_OUTPATIENT_CLINIC_OR_DEPARTMENT_OTHER): Payer: Medicare Other

## 2013-10-07 ENCOUNTER — Other Ambulatory Visit: Payer: Self-pay | Admitting: Emergency Medicine

## 2013-10-07 VITALS — BP 152/81 | HR 90 | Temp 99.0°F | Resp 19 | Ht 62.0 in | Wt 172.6 lb

## 2013-10-07 DIAGNOSIS — C50919 Malignant neoplasm of unspecified site of unspecified female breast: Secondary | ICD-10-CM

## 2013-10-07 DIAGNOSIS — R232 Flushing: Secondary | ICD-10-CM

## 2013-10-07 DIAGNOSIS — C50219 Malignant neoplasm of upper-inner quadrant of unspecified female breast: Secondary | ICD-10-CM

## 2013-10-07 DIAGNOSIS — M810 Age-related osteoporosis without current pathological fracture: Secondary | ICD-10-CM

## 2013-10-07 LAB — COMPREHENSIVE METABOLIC PANEL (CC13)
ALK PHOS: 59 U/L (ref 40–150)
ALT: 15 U/L (ref 0–55)
ANION GAP: 10 meq/L (ref 3–11)
AST: 21 U/L (ref 5–34)
Albumin: 3.8 g/dL (ref 3.5–5.0)
BILIRUBIN TOTAL: 0.6 mg/dL (ref 0.20–1.20)
BUN: 15.7 mg/dL (ref 7.0–26.0)
CO2: 24 meq/L (ref 22–29)
Calcium: 9.8 mg/dL (ref 8.4–10.4)
Chloride: 107 mEq/L (ref 98–109)
Creatinine: 0.9 mg/dL (ref 0.6–1.1)
GLUCOSE: 105 mg/dL (ref 70–140)
POTASSIUM: 4.2 meq/L (ref 3.5–5.1)
Sodium: 141 mEq/L (ref 136–145)
TOTAL PROTEIN: 7.3 g/dL (ref 6.4–8.3)

## 2013-10-07 LAB — CBC WITH DIFFERENTIAL/PLATELET
BASO%: 0.4 % (ref 0.0–2.0)
Basophils Absolute: 0 10*3/uL (ref 0.0–0.1)
EOS ABS: 0.1 10*3/uL (ref 0.0–0.5)
EOS%: 1.2 % (ref 0.0–7.0)
HCT: 39.2 % (ref 34.8–46.6)
HGB: 13 g/dL (ref 11.6–15.9)
LYMPH%: 27.6 % (ref 14.0–49.7)
MCH: 30.3 pg (ref 25.1–34.0)
MCHC: 33.1 g/dL (ref 31.5–36.0)
MCV: 91.5 fL (ref 79.5–101.0)
MONO#: 0.7 10*3/uL (ref 0.1–0.9)
MONO%: 8.4 % (ref 0.0–14.0)
NEUT%: 62.4 % (ref 38.4–76.8)
NEUTROS ABS: 5.1 10*3/uL (ref 1.5–6.5)
Platelets: 177 10*3/uL (ref 145–400)
RBC: 4.29 10*6/uL (ref 3.70–5.45)
RDW: 15.3 % — AB (ref 11.2–14.5)
WBC: 8.2 10*3/uL (ref 3.9–10.3)
lymph#: 2.3 10*3/uL (ref 0.9–3.3)

## 2013-10-07 NOTE — Patient Instructions (Signed)
Breast Cancer Survivor Follow-Up Breast cancer begins when cells in the breast divide too rapidly. The extra cells form a lump (tumor). When the cancer is treated, the goal is to get rid of all cancer cells. However, sometimes a few cells survive. These cancer cells can then grow. They become recurrent cancer. This means the cancer comes back after treatment.  Most cases of recurrent breast cancer develop 3 to 5 years after treatment. However, sometimes it comes back just a few months after treatment. Other times, it does not come back until years later. If the cancer comes back in the same area as the first breast cancer, it is called a local recurrence. If the cancer comes back somewhere else in the body, it is called regional recurrence if the site is fairly near the breast or distant recurrence if it is far from the breast. Your caregiver may also use the term metastasize to indicate a cancer that has gone to another part of your body. Treatment is still possible after either kind of recurrence. The cancer can still be controlled.  CAUSES OF RECURRENT CANCER No one knows exactly why breast cancer starts in the first place. Why the cancer comes back after treatment is also not clear. It is known that certain conditions, called risk factors, can make this more likely. They include:  Developing breast cancer for the first time before age 60.  Having breast cancer that involves the lymph nodes. These are small, round pieces of tissue found all over the body. Their job is to help fight infections.  Having a large tumor. Cancer is more apt to come back if the first tumor was bigger than 2 inches (5 cm).  Having certain types of breast cancer, such as:  Inflammatory breast cancer. This rare type grows rapidly and causes the breast to become red and swollen.  A high-grade tumor. The grade of a tumor indicates how fast it will grow and spread. High-grade tumors grow more quickly than other types.  HER2  cancer. This refers to the tumor's genetic makeup. Tumors that have this type of gene are more likely to come back after treatment.  Having close tumor margins. This refers to the space between the tumor and normal, noncancerous cells. If the space is small, the tumor has a greater chance of coming back.  Having treatment involving a surgery to remove the tumor but not the entire breast (lumpectomy) and no radiation therapy. CARE AFTER BREAST CANCER Home Monitoring Women who have had breast cancer should continue to examine their breasts every month. The goal is to catch the cancer quickly if it comes back. Many women find it helpful to do so on the same day each month and to mark the calendar as a reminder. Let your caregiver know immediately if you have any signs of recurrent breast cancer. Symptoms will vary, depending on where the cancer recurs. The original type of treatment can also make a difference. Symptoms of local recurrence after a lumpectomy or a recurrence in the opposite breast may include:  A new lump or thickening in the breast.  A change in the way the skin looks on the breast (such as a rash, dimpling, or wrinkling).  Redness or swelling of the breast.  Changes in the nipple (such as being red, puckered, swollen, or leaking fluid). Symptoms of a recurrence after a breast removal surgery (mastectomy) may include:  A lump or thickening under the skin.  A thickening around the mastectomy scar. Symptoms   of regional recurrence in the lymph nodes near the breast may include:  A lump under the arm or above the collarbone.  Swelling of the arm.  Pain in the arm, shoulder, or chest.  Numbness in the hand or arm. Symptoms of distant recurrence may include:  A cough that does not go away.  Trouble breathing or shortness of breath.  Pain in the bones or the chest. This is pain that lasts or does not respond to rest and medicine.  Headaches.  Sudden vision  problems.  Dizziness.  Nausea or vomiting.  Losing weight without trying to.  Persistent abdominal pain.  Changes in bowel movements or blood in the stool.  Yellowing of the skin or eyes (jaundice).  Blood in the urine or bloody vaginal discharge. Clinical Monitoring  It is helpful to keep a schedule of appointments for needed tests and exams. This includes physical exams, breast exams, exams of the lymph nodes, and general exams.  For the first 3 years after being treated for breast cancer, see your caregiver every 3 to 6 months.  For years 4 and 5 after breast cancer, see your caregiver every 6 to 12 months.  After 5 years, see your caregiver at least once a year.  Regular breast X-rays (mammograms) should continue even if you had a mastectomy.  A mammogram should be done 1 year after the mammogram that first detected breast cancer.  A mammogram should be done every 6 to 12 months after that. Follow your caregiver's advice.  A pelvic exam done by your caregiver checks whether female organs are the normal size and shape. The exam is usually done every year. Ask your caregiver if that schedule is right for you.  Women taking tamoxifen should report any vaginal bleeding immediately to their caregiver. Tamoxifen is often given to women with a certain type of breast cancer. It has been shown to help prevent recurrence.  You will need to decide who your primary caregiver will be.  Most people continue to see their cancer specialist (oncologist) every 3 to 6 months for the first year after cancer treatment.  At some point, you may want to go back to seeing your family caregiver. You would no longer see your oncologist for regular checkups. Many women do this about 1 year after their first diagnosis of breast cancer.  You will still need to be seen every so often by your oncologist. Ask how often that should be. Coordinate this with your family or primary caregiver.  Think about  having genetic counseling. This would provide information on traits that can be passed or inherited from one generation to the next. In some cases, breast cancer runs in families. Tell your caregiver if you:  Are of Ashkenazi Jewish heritage.  Have any family member who has had ovarian cancer.  Have a mother, sister, or daughter who had breast cancer before age 7.  Have 2 or more close relatives who have had breast cancer. This means a mother, sister, daughter, aunt, or grandmother.  Had breast cancer in both breasts.  Have a female relative who has had breast cancer.  Some tests are not recommended for routine screening. Someone recovering from breast cancer does not need to have these tests if there are no problems. The tests have risks, such as radiation exposure, and can be costly. The risks of these tests are thought to be greater than the benefits:  Blood tests.  Chest X-rays.  Bone scans.  Liver ultrasound.  Computed tomography (CT scan).  Positron emission tomography (PET scan).  Magnetic resonance imaging (MRI scan). DIAGNOSIS OF RECURRENT CANCER Recurrent breast cancer may be suspected for various reasons. A mammogram may not look normal. You might feel a lump or have other symptoms. Your caregiver may find something unusual during an exam. To be sure, your caregiver will probably order some tests. The tests are needed because there are symptoms or hints of a problem. They could include:  Blood tests, including a test to check how well the liver is working. The liver is a common site for a distant cancer recurrence.  Imaging tests that create pictures of the inside of the body. These tests include:  Chest X-rays to show if the cancer has come back in the lungs.  CT scans to create detailed pictures of various areas of the body and help find a distant recurrence.  MRI scans to find anything unusual in the breast, chest, or lymph nodes.  Breast ultrasound tests to  examine the breasts.  Bone scans to create a picture of your whole skeleton and find cancer in bony areas.  PET scans to create an image of the whole body. PET scans can be used together with CT scans to show more detail.  Biopsy. A small sample of tissue is taken and checked under a microscope. If cancer cells are found, they may be tested to see if they contain the HER2 gene or the hormones estrogen and progesterone. This will help your caregiver decide how to treat the recurrent cancer. TREATMENT  How recurrent breast cancer is treated depends on where the new cancer is found. The type of treatment that was used for the first breast cancer makes a difference, too. A combination of treatments may be used. Options include:  Surgery.  If the cancer comes back in the breast that was not treated before, you may need a lumpectomy or mastectomy.  If the cancer comes back in the breast that was treated before, you may need a mastectomy.  The lymph nodes under the arm may need to be removed.  Radiation therapy.  For a local recurrence, radiation may be used if it was not used during the first treatment.  For a distance recurrence, radiation is sometimes used.  Chemotherapy.  This may be used before surgery to treat recurrent breast cancer.  This may be used to treat recurrent cancer that cannot be treated with surgery.  This may be used to treat a distant recurrence.  Hormone therapy.  Women with the HER2 gene may be given hormone therapy to attack this gene. Document Released: 04/25/2011 Document Revised: 11/19/2011 Document Reviewed: 04/25/2011 [employer] Patient Information 2014 [employer].  
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
## 2013-10-07 NOTE — Telephone Encounter (Signed)
, °

## 2013-11-19 ENCOUNTER — Encounter: Payer: Self-pay | Admitting: Family Medicine

## 2013-11-19 ENCOUNTER — Ambulatory Visit (INDEPENDENT_AMBULATORY_CARE_PROVIDER_SITE_OTHER): Payer: Medicare Other | Admitting: Family Medicine

## 2013-11-19 VITALS — BP 144/80 | HR 97 | Temp 98.1°F | Wt 173.0 lb

## 2013-11-19 DIAGNOSIS — I10 Essential (primary) hypertension: Secondary | ICD-10-CM

## 2013-11-19 DIAGNOSIS — J019 Acute sinusitis, unspecified: Secondary | ICD-10-CM

## 2013-11-19 MED ORDER — AMLODIPINE BESYLATE 5 MG PO TABS: ORAL_TABLET | ORAL | Status: DC

## 2013-11-19 MED ORDER — AMOXICILLIN-POT CLAVULANATE 875-125 MG PO TABS: 1.0000 | ORAL_TABLET | Freq: Two times a day (BID) | ORAL | Status: DC

## 2013-11-19 NOTE — Patient Instructions (Addendum)
Get an antihistamine ---zyrtec, claritin, or allegra daily Get flonase or nasocort aq otc        Sinusitis Sinusitis is redness, soreness, and swelling (inflammation) of the paranasal sinuses. Paranasal sinuses are air pockets within the bones of your face (beneath the eyes, the middle of the forehead, or above the eyes). In healthy paranasal sinuses, mucus is able to drain out, and air is able to circulate through them by way of your nose. However, when your paranasal sinuses are inflamed, mucus and air can become trapped. This can allow bacteria and other germs to grow and cause infection. Sinusitis can develop quickly and last only a short time (acute) or continue over a long period (chronic). Sinusitis that lasts for more than 12 weeks is considered chronic.  CAUSES  Causes of sinusitis include:  Allergies.  Structural abnormalities, such as displacement of the cartilage that separates your nostrils (deviated septum), which can decrease the air flow through your nose and sinuses and affect sinus drainage.  Functional abnormalities, such as when the small hairs (cilia) that line your sinuses and help remove mucus do not work properly or are not present. SYMPTOMS  Symptoms of acute and chronic sinusitis are the same. The primary symptoms are pain and pressure around the affected sinuses. Other symptoms include:  Upper toothache.  Earache.  Headache.  Bad breath.  Decreased sense of smell and taste.  A cough, which worsens when you are lying flat.  Fatigue.  Fever.  Thick drainage from your nose, which often is green and may contain pus (purulent).  Swelling and warmth over the affected sinuses. DIAGNOSIS  Your caregiver will perform a physical exam. During the exam, your caregiver may:  Look in your nose for signs of abnormal growths in your nostrils (nasal polyps).  Tap over the affected sinus to check for signs of infection.  View the inside of your sinuses  (endoscopy) with a special imaging device with a light attached (endoscope), which is inserted into your sinuses. If your caregiver suspects that you have chronic sinusitis, one or more of the following tests may be recommended:  Allergy tests.  Nasal culture A sample of mucus is taken from your nose and sent to a lab and screened for bacteria.  Nasal cytology A sample of mucus is taken from your nose and examined by your caregiver to determine if your sinusitis is related to an allergy. TREATMENT  Most cases of acute sinusitis are related to a viral infection and will resolve on their own within 10 days. Sometimes medicines are prescribed to help relieve symptoms (pain medicine, decongestants, nasal steroid sprays, or saline sprays).  However, for sinusitis related to a bacterial infection, your caregiver will prescribe antibiotic medicines. These are medicines that will help kill the bacteria causing the infection.  Rarely, sinusitis is caused by a fungal infection. In theses cases, your caregiver will prescribe antifungal medicine. For some cases of chronic sinusitis, surgery is needed. Generally, these are cases in which sinusitis recurs more than 3 times per year, despite other treatments. HOME CARE INSTRUCTIONS   Drink plenty of water. Water helps thin the mucus so your sinuses can drain more easily.  Use a humidifier.  Inhale steam 3 to 4 times a day (for example, sit in the bathroom with the shower running).  Apply a warm, moist washcloth to your face 3 to 4 times a day, or as directed by your caregiver.  Use saline nasal sprays to help moisten and clean your  sinuses.  Take over-the-counter or prescription medicines for pain, discomfort, or fever only as directed by your caregiver. SEEK IMMEDIATE MEDICAL CARE IF:  You have increasing pain or severe headaches.  You have nausea, vomiting, or drowsiness.  You have swelling around your face.  You have vision problems.  You  have a stiff neck.  You have difficulty breathing. MAKE SURE YOU:   Understand these instructions.  Will watch your condition.  Will get help right away if you are not doing well or get worse. Document Released: 08/27/2005 Document Revised: 11/19/2011 Document Reviewed: 09/11/2011 [facility] Patient Information 2014 [place], [place].

## 2013-11-19 NOTE — Progress Notes (Signed)
Pre visit review using our clinic review tool, if applicable. No additional management support is needed unless otherwise documented below in the visit note. 

## 2013-11-19 NOTE — Progress Notes (Signed)
  Subjective:     Shannon Jensen is a 80 y.o. female who presents for evaluation of sinus pain. Symptoms include: congestion, cough, facial pain, headaches, nasal congestion and sinus pressure. Onset of symptoms was a few weeks ago. Symptoms have been gradually worsening since that time. Past history is significant for no history of pneumonia or bronchitis. Patient is a non-smoker.  Pharmacists recommended antihistamine, cough med and aspirin----helped some.    The following portions of the patient's history were reviewed and updated as appropriate: allergies, current medications, past family history, past medical history, past social history, past surgical history and problem list.  Review of Systems Pertinent items are noted in HPI.   Objective:    BP 144/80  Pulse 97  Temp(Src) 98.1 F (36.7 C) (Oral)  Wt 173 lb (78.472 kg)  SpO2 97% General appearance: alert, cooperative, appears stated age and no distress Ears: normal TM's and external ear canals both ears Nose: green discharge, moderate congestion, turbinates red, swollen, sinus tenderness bilateral Throat: lips, mucosa, and tongue normal; teeth and gums normal Neck: moderate anterior cervical adenopathy, no JVD, supple, symmetrical, trachea midline and thyroid not enlarged, symmetric, no tenderness/mass/nodules Lungs: clear to auscultation bilaterally Heart: S1, S2 normal    Assessment:    Acute bacterial sinusitis.    Plan:    Nasal steroids per medication orders. Antihistamines per medication orders. Augmentin per medication orders.

## 2013-11-20 ENCOUNTER — Telehealth: Payer: Self-pay | Admitting: Family Medicine

## 2013-11-20 NOTE — Telephone Encounter (Signed)
Relevant patient education assigned to patient using Emmi. ° °

## 2014-04-22 ENCOUNTER — Telehealth: Payer: Self-pay | Admitting: Gastroenterology

## 2014-04-22 NOTE — Telephone Encounter (Signed)
OK with me.

## 2014-04-22 NOTE — Telephone Encounter (Signed)
Dr. Kaplan will you accept? 

## 2014-04-22 NOTE — Telephone Encounter (Signed)
Dr. Fuller Plan do you approve of switch?

## 2014-04-23 ENCOUNTER — Encounter: Payer: Self-pay | Admitting: Gastroenterology

## 2014-04-23 NOTE — Telephone Encounter (Signed)
yes

## 2014-05-18 ENCOUNTER — Encounter: Payer: Self-pay | Admitting: Family Medicine

## 2014-05-18 ENCOUNTER — Ambulatory Visit (INDEPENDENT_AMBULATORY_CARE_PROVIDER_SITE_OTHER): Payer: Medicare Other | Admitting: Family Medicine

## 2014-05-18 VITALS — BP 138/84 | HR 80 | Temp 98.1°F | Ht 65.0 in | Wt 176.8 lb

## 2014-05-18 DIAGNOSIS — Z0000 Encounter for general adult medical examination without abnormal findings: Secondary | ICD-10-CM

## 2014-05-18 DIAGNOSIS — R319 Hematuria, unspecified: Secondary | ICD-10-CM

## 2014-05-18 DIAGNOSIS — Z23 Encounter for immunization: Secondary | ICD-10-CM

## 2014-05-18 DIAGNOSIS — I10 Essential (primary) hypertension: Secondary | ICD-10-CM

## 2014-05-18 DIAGNOSIS — M47817 Spondylosis without myelopathy or radiculopathy, lumbosacral region: Secondary | ICD-10-CM

## 2014-05-18 DIAGNOSIS — Z124 Encounter for screening for malignant neoplasm of cervix: Secondary | ICD-10-CM

## 2014-05-18 LAB — POCT URINALYSIS DIPSTICK
BILIRUBIN UA: NEGATIVE
Glucose, UA: NEGATIVE
Ketones, UA: NEGATIVE
Leukocytes, UA: NEGATIVE
NITRITE UA: NEGATIVE
PH UA: 6
Spec Grav, UA: 1.025
Urobilinogen, UA: 2

## 2014-05-18 MED ORDER — NONFORMULARY OR COMPOUNDED ITEM: Status: DC

## 2014-05-18 MED ORDER — AMLODIPINE BESYLATE 5 MG PO TABS: ORAL_TABLET | ORAL | Status: DC

## 2014-05-18 MED ORDER — ZOSTER VACCINE LIVE 19400 UNT/0.65ML ~~LOC~~ SOLR: 0.6500 mL | Freq: Once | SUBCUTANEOUS | Status: DC

## 2014-05-18 NOTE — Patient Instructions (Signed)
Preventive Care for Adults A healthy lifestyle and preventive care can promote health and wellness. Preventive health guidelines for women include the following key practices.  A routine yearly physical is a good way to check with your health care provider about your health and preventive screening. It is a chance to share any concerns and updates on your health and to receive a thorough exam.  Visit your dentist for a routine exam and preventive care every 6 months. Brush your teeth twice a day and floss once a day. Good oral hygiene prevents tooth decay and gum disease.  The frequency of eye exams is based on your age, health, family medical history, use of contact lenses, and other factors. Follow your health care provider's recommendations for frequency of eye exams.  Eat a healthy diet. Foods like vegetables, fruits, whole grains, low-fat dairy products, and lean protein foods contain the nutrients you need without too many calories. Decrease your intake of foods high in solid fats, added sugars, and salt. Eat the right amount of calories for you.Get information about a proper diet from your health care provider, if necessary.  Regular physical exercise is one of the most important things you can do for your health. Most adults should get at least 150 minutes of moderate-intensity exercise (any activity that increases your heart rate and causes you to sweat) each week. In addition, most adults need muscle-strengthening exercises on 2 or more days a week.  Maintain a healthy weight. The body mass index (BMI) is a screening tool to identify possible weight problems. It provides an estimate of body fat based on height and weight. Your health care provider can find your BMI and can help you achieve or maintain a healthy weight.For adults 20 years and older:  A BMI below 18.5 is considered underweight.  A BMI of 18.5 to 24.9 is normal.  A BMI of 25 to 29.9 is considered overweight.  A BMI of  30 and above is considered obese.  Maintain normal blood lipids and cholesterol levels by exercising and minimizing your intake of saturated fat. Eat a balanced diet with plenty of fruit and vegetables. Blood tests for lipids and cholesterol should begin at age 76 and be repeated every 5 years. If your lipid or cholesterol levels are high, you are over 50, or you are at high risk for heart disease, you may need your cholesterol levels checked more frequently.Ongoing high lipid and cholesterol levels should be treated with medicines if diet and exercise are not working.  If you smoke, find out from your health care provider how to quit. If you do not use tobacco, do not start.  Lung cancer screening is recommended for adults aged 22-80 years who are at high risk for developing lung cancer because of a history of smoking. A yearly low-dose CT scan of the lungs is recommended for people who have at least a 30-pack-year history of smoking and are a current smoker or have quit within the past 15 years. A pack year of smoking is smoking an average of 1 pack of cigarettes a day for 1 year (for example: 1 pack a day for 30 years or 2 packs a day for 15 years). Yearly screening should continue until the smoker has stopped smoking for at least 15 years. Yearly screening should be stopped for people who develop a health problem that would prevent them from having lung cancer treatment.  If you are pregnant, do not drink alcohol. If you are breastfeeding,  be very cautious about drinking alcohol. If you are not pregnant and choose to drink alcohol, do not have more than 1 drink per day. One drink is considered to be 12 ounces (355 mL) of beer, 5 ounces (148 mL) of wine, or 1.5 ounces (44 mL) of liquor.  Avoid use of street drugs. Do not share needles with anyone. Ask for help if you need support or instructions about stopping the use of drugs.  High blood pressure causes heart disease and increases the risk of  stroke. Your blood pressure should be checked at least every 1 to 2 years. Ongoing high blood pressure should be treated with medicines if weight loss and exercise do not work.  If you are 75-52 years old, ask your health care provider if you should take aspirin to prevent strokes.  Diabetes screening involves taking a blood sample to check your fasting blood sugar level. This should be done once every 3 years, after age 15, if you are within normal weight and without risk factors for diabetes. Testing should be considered at a younger age or be carried out more frequently if you are overweight and have at least 1 risk factor for diabetes.  Breast cancer screening is essential preventive care for women. You should practice "breast self-awareness." This means understanding the normal appearance and feel of your breasts and may include breast self-examination. Any changes detected, no matter how small, should be reported to a health care provider. Women in their 58s and 30s should have a clinical breast exam (CBE) by a health care provider as part of a regular health exam every 1 to 3 years. After age 16, women should have a CBE every year. Starting at age 53, women should consider having a mammogram (breast X-ray test) every year. Women who have a family history of breast cancer should talk to their health care provider about genetic screening. Women at a high risk of breast cancer should talk to their health care providers about having an MRI and a mammogram every year.  Breast cancer gene (BRCA)-related cancer risk assessment is recommended for women who have family members with BRCA-related cancers. BRCA-related cancers include breast, ovarian, tubal, and peritoneal cancers. Having family members with these cancers may be associated with an increased risk for harmful changes (mutations) in the breast cancer genes BRCA1 and BRCA2. Results of the assessment will determine the need for genetic counseling and  BRCA1 and BRCA2 testing.  Routine pelvic exams to screen for cancer are no longer recommended for nonpregnant women who are considered low risk for cancer of the pelvic organs (ovaries, uterus, and vagina) and who do not have symptoms. Ask your health care provider if a screening pelvic exam is right for you.  If you have had past treatment for cervical cancer or a condition that could lead to cancer, you need Pap tests and screening for cancer for at least 20 years after your treatment. If Pap tests have been discontinued, your risk factors (such as having a new sexual partner) need to be reassessed to determine if screening should be resumed. Some women have medical problems that increase the chance of getting cervical cancer. In these cases, your health care provider may recommend more frequent screening and Pap tests.  The HPV test is an additional test that may be used for cervical cancer screening. The HPV test looks for the virus that can cause the cell changes on the cervix. The cells collected during the Pap test can be  tested for HPV. The HPV test could be used to screen women aged 30 years and older, and should be used in women of any age who have unclear Pap test results. After the age of 30, women should have HPV testing at the same frequency as a Pap test.  Colorectal cancer can be detected and often prevented. Most routine colorectal cancer screening begins at the age of 50 years and continues through age 75 years. However, your health care provider may recommend screening at an earlier age if you have risk factors for colon cancer. On a yearly basis, your health care provider may provide home test kits to check for hidden blood in the stool. Use of a small camera at the end of a tube, to directly examine the colon (sigmoidoscopy or colonoscopy), can detect the earliest forms of colorectal cancer. Talk to your health care provider about this at age 50, when routine screening begins. Direct  exam of the colon should be repeated every 5-10 years through age 75 years, unless early forms of pre-cancerous polyps or small growths are found.  People who are at an increased risk for hepatitis B should be screened for this virus. You are considered at high risk for hepatitis B if:  You were born in a country where hepatitis B occurs often. Talk with your health care provider about which countries are considered high risk.  Your parents were born in a high-risk country and you have not received a shot to protect against hepatitis B (hepatitis B vaccine).  You have HIV or AIDS.  You use needles to inject street drugs.  You live with, or have sex with, someone who has hepatitis B.  You get hemodialysis treatment.  You take certain medicines for conditions like cancer, organ transplantation, and autoimmune conditions.  Hepatitis C blood testing is recommended for all people born from 1945 through 1965 and any individual with known risks for hepatitis C.  Practice safe sex. Use condoms and avoid high-risk sexual practices to reduce the spread of sexually transmitted infections (STIs). STIs include gonorrhea, chlamydia, syphilis, trichomonas, herpes, HPV, and human immunodeficiency virus (HIV). Herpes, HIV, and HPV are viral illnesses that have no cure. They can result in disability, cancer, and death.  You should be screened for sexually transmitted illnesses (STIs) including gonorrhea and chlamydia if:  You are sexually active and are younger than 24 years.  You are older than 24 years and your health care provider tells you that you are at risk for this type of infection.  Your sexual activity has changed since you were last screened and you are at an increased risk for chlamydia or gonorrhea. Ask your health care provider if you are at risk.  If you are at risk of being infected with HIV, it is recommended that you take a prescription medicine daily to prevent HIV infection. This is  called preexposure prophylaxis (PrEP). You are considered at risk if:  You are a heterosexual woman, are sexually active, and are at increased risk for HIV infection.  You take drugs by injection.  You are sexually active with a partner who has HIV.  Talk with your health care provider about whether you are at high risk of being infected with HIV. If you choose to begin PrEP, you should first be tested for HIV. You should then be tested every 3 months for as long as you are taking PrEP.  Osteoporosis is a disease in which the bones lose minerals and strength   with aging. This can result in serious bone fractures or breaks. The risk of osteoporosis can be identified using a bone density scan. Women ages 65 years and over and women at risk for fractures or osteoporosis should discuss screening with their health care providers. Ask your health care provider whether you should take a calcium supplement or vitamin D to reduce the rate of osteoporosis.  Menopause can be associated with physical symptoms and risks. Hormone replacement therapy is available to decrease symptoms and risks. You should talk to your health care provider about whether hormone replacement therapy is right for you.  Use sunscreen. Apply sunscreen liberally and repeatedly throughout the day. You should seek shade when your shadow is shorter than you. Protect yourself by wearing long sleeves, pants, a wide-brimmed hat, and sunglasses year round, whenever you are outdoors.  Once a month, do a whole body skin exam, using a mirror to look at the skin on your back. Tell your health care provider of new moles, moles that have irregular borders, moles that are larger than a pencil eraser, or moles that have changed in shape or color.  Stay current with required vaccines (immunizations).  Influenza vaccine. All adults should be immunized every year.  Tetanus, diphtheria, and acellular pertussis (Td, Tdap) vaccine. Pregnant women should  receive 1 dose of Tdap vaccine during each pregnancy. The dose should be obtained regardless of the length of time since the last dose. Immunization is preferred during the 27th-36th week of gestation. An adult who has not previously received Tdap or who does not know her vaccine status should receive 1 dose of Tdap. This initial dose should be followed by tetanus and diphtheria toxoids (Td) booster doses every 10 years. Adults with an unknown or incomplete history of completing a 3-dose immunization series with Td-containing vaccines should begin or complete a primary immunization series including a Tdap dose. Adults should receive a Td booster every 10 years.  Varicella vaccine. An adult without evidence of immunity to varicella should receive 2 doses or a second dose if she has previously received 1 dose. Pregnant females who do not have evidence of immunity should receive the first dose after pregnancy. This first dose should be obtained before leaving the health care facility. The second dose should be obtained 4-8 weeks after the first dose.  Human papillomavirus (HPV) vaccine. Females aged 13-26 years who have not received the vaccine previously should obtain the 3-dose series. The vaccine is not recommended for use in pregnant females. However, pregnancy testing is not needed before receiving a dose. If a female is found to be pregnant after receiving a dose, no treatment is needed. In that case, the remaining doses should be delayed until after the pregnancy. Immunization is recommended for any person with an immunocompromised condition through the age of 26 years if she did not get any or all doses earlier. During the 3-dose series, the second dose should be obtained 4-8 weeks after the first dose. The third dose should be obtained 24 weeks after the first dose and 16 weeks after the second dose.  Zoster vaccine. One dose is recommended for adults aged 60 years or older unless certain conditions are  present.  Measles, mumps, and rubella (MMR) vaccine. Adults born before 1957 generally are considered immune to measles and mumps. Adults born in 1957 or later should have 1 or more doses of MMR vaccine unless there is a contraindication to the vaccine or there is laboratory evidence of immunity to   each of the three diseases. A routine second dose of MMR vaccine should be obtained at least 28 days after the first dose for students attending postsecondary schools, health care workers, or international travelers. People who received inactivated measles vaccine or an unknown type of measles vaccine during 1963-1967 should receive 2 doses of MMR vaccine. People who received inactivated mumps vaccine or an unknown type of mumps vaccine before 1979 and are at high risk for mumps infection should consider immunization with 2 doses of MMR vaccine. For females of childbearing age, rubella immunity should be determined. If there is no evidence of immunity, females who are not pregnant should be vaccinated. If there is no evidence of immunity, females who are pregnant should delay immunization until after pregnancy. Unvaccinated health care workers born before 1957 who lack laboratory evidence of measles, mumps, or rubella immunity or laboratory confirmation of disease should consider measles and mumps immunization with 2 doses of MMR vaccine or rubella immunization with 1 dose of MMR vaccine.  Pneumococcal 13-valent conjugate (PCV13) vaccine. When indicated, a person who is uncertain of her immunization history and has no record of immunization should receive the PCV13 vaccine. An adult aged 19 years or older who has certain medical conditions and has not been previously immunized should receive 1 dose of PCV13 vaccine. This PCV13 should be followed with a dose of pneumococcal polysaccharide (PPSV23) vaccine. The PPSV23 vaccine dose should be obtained at least 8 weeks after the dose of PCV13 vaccine. An adult aged 19  years or older who has certain medical conditions and previously received 1 or more doses of PPSV23 vaccine should receive 1 dose of PCV13. The PCV13 vaccine dose should be obtained 1 or more years after the last PPSV23 vaccine dose.  Pneumococcal polysaccharide (PPSV23) vaccine. When PCV13 is also indicated, PCV13 should be obtained first. All adults aged 65 years and older should be immunized. An adult younger than age 65 years who has certain medical conditions should be immunized. Any person who resides in a nursing home or long-term care facility should be immunized. An adult smoker should be immunized. People with an immunocompromised condition and certain other conditions should receive both PCV13 and PPSV23 vaccines. People with human immunodeficiency virus (HIV) infection should be immunized as soon as possible after diagnosis. Immunization during chemotherapy or radiation therapy should be avoided. Routine use of PPSV23 vaccine is not recommended for American Indians, [place] Natives, or people younger than 65 years unless there are medical conditions that require PPSV23 vaccine. When indicated, people who have unknown immunization and have no record of immunization should receive PPSV23 vaccine. One-time revaccination 5 years after the first dose of PPSV23 is recommended for people aged 19-64 years who have chronic kidney failure, nephrotic syndrome, asplenia, or immunocompromised conditions. People who received 1-2 doses of PPSV23 before age 65 years should receive another dose of PPSV23 vaccine at age 65 years or later if at least 5 years have passed since the previous dose. Doses of PPSV23 are not needed for people immunized with PPSV23 at or after age 65 years.  Meningococcal vaccine. Adults with asplenia or persistent complement component deficiencies should receive 2 doses of quadrivalent meningococcal conjugate (MenACWY-D) vaccine. The doses should be obtained at least 2 months apart.  Microbiologists working with certain meningococcal bacteria, military recruits, people at risk during an outbreak, and people who travel to or live in countries with a high rate of meningitis should be immunized. A first-year college student up through age   21 years who is living in a residence hall should receive a dose if she did not receive a dose on or after her 16th birthday. Adults who have certain high-risk conditions should receive one or more doses of vaccine.  Hepatitis A vaccine. Adults who wish to be protected from this disease, have certain high-risk conditions, work with hepatitis A-infected animals, work in hepatitis A research labs, or travel to or work in countries with a high rate of hepatitis A should be immunized. Adults who were previously unvaccinated and who anticipate close contact with an international adoptee during the first 60 days after arrival in the [place] from a country with a high rate of hepatitis A should be immunized.  Hepatitis B vaccine. Adults who wish to be protected from this disease, have certain high-risk conditions, may be exposed to blood or other infectious body fluids, are household contacts or sex partners of hepatitis B positive people, are clients or workers in certain care facilities, or travel to or work in countries with a high rate of hepatitis B should be immunized.  Haemophilus influenzae type b (Hib) vaccine. A previously unvaccinated person with asplenia or sickle cell disease or having a scheduled splenectomy should receive 1 dose of Hib vaccine. Regardless of previous immunization, a recipient of a hematopoietic stem cell transplant should receive a 3-dose series 6-12 months after her successful transplant. Hib vaccine is not recommended for adults with HIV infection. Preventive Services / Frequency Ages 64 to 68 years  Blood pressure check.** / Every 1 to 2 years.  Lipid and cholesterol check.** / Every 5 years beginning at age  22.  Clinical breast exam.** / Every 3 years for women in their 88s and 53s.  BRCA-related cancer risk assessment.** / For women who have family members with a BRCA-related cancer (breast, ovarian, tubal, or peritoneal cancers).  Pap test.** / Every 2 years from ages 90 through 51. Every 3 years starting at age 21 through age 56 or 3 with a history of 3 consecutive normal Pap tests.  HPV screening.** / Every 3 years from ages 24 through ages 1 to 46 with a history of 3 consecutive normal Pap tests.  Hepatitis C blood test.** / For any individual with known risks for hepatitis C.  Skin self-exam. / Monthly.  Influenza vaccine. / Every year.  Tetanus, diphtheria, and acellular pertussis (Tdap, Td) vaccine.** / Consult your health care provider. Pregnant women should receive 1 dose of Tdap vaccine during each pregnancy. 1 dose of Td every 10 years.  Varicella vaccine.** / Consult your health care provider. Pregnant females who do not have evidence of immunity should receive the first dose after pregnancy.  HPV vaccine. / 3 doses over 6 months, if 72 and younger. The vaccine is not recommended for use in pregnant females. However, pregnancy testing is not needed before receiving a dose.  Measles, mumps, rubella (MMR) vaccine.** / You need at least 1 dose of MMR if you were born in 1957 or later. You may also need a 2nd dose. For females of childbearing age, rubella immunity should be determined. If there is no evidence of immunity, females who are not pregnant should be vaccinated. If there is no evidence of immunity, females who are pregnant should delay immunization until after pregnancy.  Pneumococcal 13-valent conjugate (PCV13) vaccine.** / Consult your health care provider.  Pneumococcal polysaccharide (PPSV23) vaccine.** / 1 to 2 doses if you smoke cigarettes or if you have certain conditions.  Meningococcal vaccine.** /  1 dose if you are age 19 to 21 years and a first-year college  student living in a residence hall, or have one of several medical conditions, you need to get vaccinated against meningococcal disease. You may also need additional booster doses.  Hepatitis A vaccine.** / Consult your health care provider.  Hepatitis B vaccine.** / Consult your health care provider.  Haemophilus influenzae type b (Hib) vaccine.** / Consult your health care provider. Ages 40 to 64 years  Blood pressure check.** / Every 1 to 2 years.  Lipid and cholesterol check.** / Every 5 years beginning at age 20 years.  Lung cancer screening. / Every year if you are aged 55-80 years and have a 30-pack-year history of smoking and currently smoke or have quit within the past 15 years. Yearly screening is stopped once you have quit smoking for at least 15 years or develop a health problem that would prevent you from having lung cancer treatment.  Clinical breast exam.** / Every year after age 40 years.  BRCA-related cancer risk assessment.** / For women who have family members with a BRCA-related cancer (breast, ovarian, tubal, or peritoneal cancers).  Mammogram.** / Every year beginning at age 40 years and continuing for as long as you are in good health. Consult with your health care provider.  Pap test.** / Every 3 years starting at age 30 years through age 65 or 70 years with a history of 3 consecutive normal Pap tests.  HPV screening.** / Every 3 years from ages 30 years through ages 65 to 70 years with a history of 3 consecutive normal Pap tests.  Fecal occult blood test (FOBT) of stool. / Every year beginning at age 50 years and continuing until age 75 years. You may not need to do this test if you get a colonoscopy every 10 years.  Flexible sigmoidoscopy or colonoscopy.** / Every 5 years for a flexible sigmoidoscopy or every 10 years for a colonoscopy beginning at age 50 years and continuing until age 75 years.  Hepatitis C blood test.** / For all people born from 1945 through  1965 and any individual with known risks for hepatitis C.  Skin self-exam. / Monthly.  Influenza vaccine. / Every year.  Tetanus, diphtheria, and acellular pertussis (Tdap/Td) vaccine.** / Consult your health care provider. Pregnant women should receive 1 dose of Tdap vaccine during each pregnancy. 1 dose of Td every 10 years.  Varicella vaccine.** / Consult your health care provider. Pregnant females who do not have evidence of immunity should receive the first dose after pregnancy.  Zoster vaccine.** / 1 dose for adults aged 60 years or older.  Measles, mumps, rubella (MMR) vaccine.** / You need at least 1 dose of MMR if you were born in 1957 or later. You may also need a 2nd dose. For females of childbearing age, rubella immunity should be determined. If there is no evidence of immunity, females who are not pregnant should be vaccinated. If there is no evidence of immunity, females who are pregnant should delay immunization until after pregnancy.  Pneumococcal 13-valent conjugate (PCV13) vaccine.** / Consult your health care provider.  Pneumococcal polysaccharide (PPSV23) vaccine.** / 1 to 2 doses if you smoke cigarettes or if you have certain conditions.  Meningococcal vaccine.** / Consult your health care provider.  Hepatitis A vaccine.** / Consult your health care provider.  Hepatitis B vaccine.** / Consult your health care provider.  Haemophilus influenzae type b (Hib) vaccine.** / Consult your health care provider. Ages 65   years and over  Blood pressure check.** / Every 1 to 2 years.  Lipid and cholesterol check.** / Every 5 years beginning at age 22 years.  Lung cancer screening. / Every year if you are aged 73-80 years and have a 30-pack-year history of smoking and currently smoke or have quit within the past 15 years. Yearly screening is stopped once you have quit smoking for at least 15 years or develop a health problem that would prevent you from having lung cancer  treatment.  Clinical breast exam.** / Every year after age 4 years.  BRCA-related cancer risk assessment.** / For women who have family members with a BRCA-related cancer (breast, ovarian, tubal, or peritoneal cancers).  Mammogram.** / Every year beginning at age 40 years and continuing for as long as you are in good health. Consult with your health care provider.  Pap test.** / Every 3 years starting at age 9 years through age 34 or 91 years with 3 consecutive normal Pap tests. Testing can be stopped between 65 and 70 years with 3 consecutive normal Pap tests and no abnormal Pap or HPV tests in the past 10 years.  HPV screening.** / Every 3 years from ages 57 years through ages 64 or 45 years with a history of 3 consecutive normal Pap tests. Testing can be stopped between 65 and 70 years with 3 consecutive normal Pap tests and no abnormal Pap or HPV tests in the past 10 years.  Fecal occult blood test (FOBT) of stool. / Every year beginning at age 15 years and continuing until age 17 years. You may not need to do this test if you get a colonoscopy every 10 years.  Flexible sigmoidoscopy or colonoscopy.** / Every 5 years for a flexible sigmoidoscopy or every 10 years for a colonoscopy beginning at age 86 years and continuing until age 71 years.  Hepatitis C blood test.** / For all people born from [birth year] through 1963 and any individual with known risks for hepatitis C.  Osteoporosis screening.** / A one-time screening for women ages 83 years and over and women at risk for fractures or osteoporosis.  Skin self-exam. / Monthly.  Influenza vaccine. / Every year.  Tetanus, diphtheria, and acellular pertussis (Tdap/Td) vaccine.** / 1 dose of Td every 10 years.  Varicella vaccine.** / Consult your health care provider.  Zoster vaccine.** / 1 dose for adults aged 61 years or older.  Pneumococcal 13-valent conjugate (PCV13) vaccine.** / Consult your health care provider.  Pneumococcal  polysaccharide (PPSV23) vaccine.** / 1 dose for all adults aged 28 years and older.  Meningococcal vaccine.** / Consult your health care provider.  Hepatitis A vaccine.** / Consult your health care provider.  Hepatitis B vaccine.** / Consult your health care provider.  Haemophilus influenzae type b (Hib) vaccine.** / Consult your health care provider. ** Family history and personal history of risk and conditions may change your health care provider's recommendations. Document Released: 10/23/2001 Document Revised: 01/11/2014 Document Reviewed: 01/22/2011 [facility] Patient Information 2015 [place], [place]. This information is not intended to replace advice given to you by your health care provider. Make sure you discuss any questions you have with your health care provider.

## 2014-05-18 NOTE — Progress Notes (Signed)
Pre visit review using our clinic review tool, if applicable. No additional management support is needed unless otherwise documented below in the visit note. 

## 2014-05-18 NOTE — Progress Notes (Signed)
Subjective:    Shannon Jensen is a 80 y.o. female who presents for Medicare Annual/Subsequent preventive examination.  Preventive Screening-Counseling & Management  Tobacco History  Smoking status  . Never Smoker   Smokeless tobacco  . Never Used     Problems Prior to Visit 1. na  Current Problems (verified) Patient Active Problem List   Diagnosis Date Noted  . GERD (gastroesophageal reflux disease) 08/27/2012  . HLD (hyperlipidemia)   . Chronic headaches   . Anxiety and depression   . Diverticulosis   . Osteoarthritis   . Hypertension   . Osteoporosis   . Pelvic relaxation   . Cystocele   . H/O vitamin D deficiency   . Cervicogenic headache 09/13/2011  . Headache 08/29/2011  . Postconcussion syndrome 04/05/2011  . Head injury, closed, with LOC of unknown duration 03/09/2011  . Low back pain radiating to both legs 03/09/2011  . SINUSITIS - ACUTE-NOS 09/27/2009  . HYPERLIPIDEMIA 11/08/2008  . Breast cancer 08/10/2008  . VITAMIN D DEFICIENCY 05/11/2008  . MALIGNANT NEOPLASM OF BREAST UNSPECIFIED SITE 04/22/2008  . HYPERTENSION 04/13/2008  . RHEUMATOID ARTHRITIS 04/13/2008  . OSTEOARTHRITIS 04/13/2008    Medications Prior to Visit Current Outpatient Prescriptions on File Prior to Visit  Medication Sig Dispense Refill  . acetaminophen (TYLENOL) 325 MG tablet Take 650 mg by mouth every 6 (six) hours as needed. For pain      . aspirin 81 MG tablet Take 81 mg by mouth daily.        . COD LIVER OIL PO Take 1 tablet by mouth daily.       Marland Kitchen ibuprofen (ADVIL,MOTRIN) 200 MG tablet Take 200 mg by mouth every 6 (six) hours as needed.      . Multiple Vitamins-Minerals (RA HAIR/SKIN/NAILS) TABS Take 1 tablet by mouth daily.        . Multiple Vitamins-Minerals (WOMENS ONE DAILY) TABS Take 1 tablet by mouth daily.        . Omega-3 Fatty Acids (FISH OIL) 1200 MG CAPS Take 1 capsule by mouth daily.         No current facility-administered medications on file prior to visit.     Current Medications (verified) Current Outpatient Prescriptions  Medication Sig Dispense Refill  . acetaminophen (TYLENOL) 325 MG tablet Take 650 mg by mouth every 6 (six) hours as needed. For pain      . amLODipine (NORVASC) 5 MG tablet Take one tablet by mouth one time daily  90 tablet  1  . aspirin 81 MG tablet Take 81 mg by mouth daily.        . COD LIVER OIL PO Take 1 tablet by mouth daily.       Marland Kitchen ibuprofen (ADVIL,MOTRIN) 200 MG tablet Take 200 mg by mouth every 6 (six) hours as needed.      . Multiple Vitamins-Minerals (RA HAIR/SKIN/NAILS) TABS Take 1 tablet by mouth daily.        . Multiple Vitamins-Minerals (WOMENS ONE DAILY) TABS Take 1 tablet by mouth daily.        . Omega-3 Fatty Acids (FISH OIL) 1200 MG CAPS Take 1 capsule by mouth daily.        . NONFORMULARY OR COMPOUNDED ITEM Wheelchair  #1 diagnosis--osteoarthritis, back pain  1 each  0  . zoster vaccine live, PF, (ZOSTAVAX) 46803 UNT/0.65ML injection Inject 19,400 Units into the skin once.  1 vial  0   No current facility-administered medications for this visit.  Allergies (verified) Celecoxib; Erythromycin; Iodine; Methocarbamol; Nabumetone; Nsaids; and Vioxx   PAST HISTORY  Family History Family History  Problem Relation Age of Onset  . Prostate cancer Father     brother  . Heart disease Paternal Uncle   . Colon cancer Mother   . Breast cancer Sister   . Pancreatic cancer Brother   . Colon polyps Daughter     x 2, son  . Stroke Daughter     x 2  . Appendicitis Daughter     appendectomy  . Cancer Other     prostate    Social History History  Substance Use Topics  . Smoking status: Never Smoker   . Smokeless tobacco: Never Used  . Alcohol Use: No     Are there smokers in your home (other than you)? No  Risk Factors Current exercise habits: The patient does not participate in regular exercise at present.  Dietary issues discussed: na   Cardiac risk factors: advanced age (older than 37  for men, 8 for women), dyslipidemia, hypertension and sedentary lifestyle.  Depression Screen (Note: if answer to either of the following is "Yes", a more complete depression screening is indicated)   Over the past two weeks, have you felt down, depressed or hopeless? No  Over the past two weeks, have you felt little interest or pleasure in doing things? No  Have you lost interest or pleasure in daily life? No  Do you often feel hopeless? No  Do you cry easily over simple problems? No  Activities of Daily Living In your present state of health, do you have any difficulty performing the following activities?:  Driving? Yes Managing money?  No Feeding yourself? No Getting from bed to chair? No Climbing a flight of stairs? Yes Preparing food and eating?: No Bathing or showering? No Getting dressed: No Getting to the toilet? No Using the toilet:No Moving around from place to place: No In the past year have you fallen or had a near fall?:No   Are you sexually active?  No  Do you have more than one partner?  No  Hearing Difficulties: No Do you often ask people to speak up or repeat themselves? No Do you experience ringing or noises in your ears? No Do you have difficulty understanding soft or whispered voices? No   Do you feel that you have a problem with memory? No  Do you often misplace items? No  Do you feel safe at home?  Yes  Cognitive Testing  Alert? Yes  Normal Appearance?Yes  Oriented to person? Yes  Place? Yes   Time? Yes  Recall of three objects?  Yes  Can perform simple calculations? Yes  Displays appropriate judgment?Yes  Can read the correct time from a watch face?Yes   Advanced Directives have been discussed with the patient? Yes  List the Names of Other Physician/Practitioners you currently use: 1.  onc-- Kha  Indicate any recent Medical Services you may have received from other than [facility] providers in the past year (date may be  approximate).  Immunization History  Administered Date(s) Administered  . Influenza Split 06/13/2012  . Influenza Whole 08/10/2009, 07/25/2010  . Pneumococcal Conjugate-13 05/18/2014  . Pneumococcal Polysaccharide-23 05/28/2007    Screening Tests Health Maintenance  Topic Date Due  . Pap Smear  08-16-30  . Tetanus/tdap  08/20/1949  . Zostavax  08/20/1990  . Colonoscopy  06/27/2011  . Influenza Vaccine  05/19/2015 (Originally 04/10/2014)  . Mammogram  08/19/2014  . Pneumococcal  Polysaccharide Vaccine Age 71 And Over  Completed    All answers were reviewed with the patient and necessary referrals were made:  Garnet Koyanagi, DO   05/18/2014   History reviewed:  She  has a past medical history of Hypertension; Osteoarthritis; GERD (gastroesophageal reflux disease); Diverticulosis; Anxiety and depression; Breast cancer (12/ 2009); Chronic headaches; HLD (hyperlipidemia); Osteoporosis; Pelvic relaxation; Cystocele; and H/O vitamin D deficiency. She  does not have any pertinent problems on file. She  has past surgical history that includes Appendectomy (1970); Breast surgery (2009); Neck surgery; and Abdominal hysterectomy (1998). Her family history includes Appendicitis in her daughter; Breast cancer in her sister; Cancer in her other; Colon cancer in her mother; Colon polyps in her daughter; Heart disease in her paternal uncle; Pancreatic cancer in her brother; Prostate cancer in her father; Stroke in her daughter. She  reports that she has never smoked. She has never used smokeless tobacco. She reports that she does not drink alcohol or use illicit drugs. She has a current medication list which includes the following prescription(s): acetaminophen, amlodipine, aspirin, cod liver oil, ibuprofen, ra hair/skin/nails, womens one daily, fish oil, NONFORMULARY OR COMPOUNDED ITEM, and zoster vaccine live (pf). Current Outpatient Prescriptions on File Prior to Visit  Medication Sig Dispense Refill   . acetaminophen (TYLENOL) 325 MG tablet Take 650 mg by mouth every 6 (six) hours as needed. For pain      . aspirin 81 MG tablet Take 81 mg by mouth daily.        . COD LIVER OIL PO Take 1 tablet by mouth daily.       Marland Kitchen ibuprofen (ADVIL,MOTRIN) 200 MG tablet Take 200 mg by mouth every 6 (six) hours as needed.      . Multiple Vitamins-Minerals (RA HAIR/SKIN/NAILS) TABS Take 1 tablet by mouth daily.        . Multiple Vitamins-Minerals (WOMENS ONE DAILY) TABS Take 1 tablet by mouth daily.        . Omega-3 Fatty Acids (FISH OIL) 1200 MG CAPS Take 1 capsule by mouth daily.         No current facility-administered medications on file prior to visit.   She is allergic to celecoxib; erythromycin; iodine; methocarbamol; nabumetone; nsaids; and vioxx.  Review of Systems  Review of Systems  Constitutional: Negative for activity change, appetite change and fatigue.  HENT: Negative for hearing loss, congestion, tinnitus and ear discharge.   Eyes: Negative for visual disturbance (see optho -- due Respiratory: Negative for cough, chest tightness and shortness of breath.   Cardiovascular: Negative for chest pain, palpitations and leg swelling.  Gastrointestinal: Negative for abdominal pain, diarrhea, constipation and abdominal distention.  Genitourinary: Negative for urgency, frequency, decreased urine volume and difficulty urinating.  Musculoskeletal: Negative for back pain, arthralgias and gait problem.  Skin: Negative for color change, pallor and rash.  Neurological: Negative for dizziness, light-headedness, numbness and headaches.  Hematological: Negative for adenopathy. Does not bruise/bleed easily.  Psychiatric/Behavioral: Negative for suicidal ideas, confusion, sleep disturbance, self-injury, dysphoric mood, decreased concentration and agitation.  Pt is able to read and write and can do all ADLs No risk for falling No abuse/ violence in home      Objective:    Vision by Snellen chart:  opth  Body mass index is 29.42 kg/(m^2). BP 138/84  Pulse 80  Temp(Src) 98.1 F (36.7 C) (Oral)  Ht 5\' 5"  (1.651 m)  Wt 176 lb 12.9 oz (80.2 kg)  BMI 29.42 kg/m2  SpO2  100%  BP 138/84  Pulse 80  Temp(Src) 98.1 F (36.7 C) (Oral)  Ht 5\' 5"  (1.651 m)  Wt 176 lb 12.9 oz (80.2 kg)  BMI 29.42 kg/m2  SpO2 100% General appearance: alert, cooperative, appears stated age and no distress Head: Normocephalic, without obvious abnormality, atraumatic Eyes: negative findings: lids and lashes normal, conjunctivae and sclerae normal and pupils equal, round, reactive to light and accomodation Ears: normal TM's and external ear canals both ears Nose: Nares normal. Septum midline. Mucosa normal. No drainage or sinus tenderness. Throat: lips, mucosa, and tongue normal; teeth and gums normal Neck: no adenopathy, no carotid bruit, no JVD, supple, symmetrical, trachea midline and thyroid not enlarged, symmetric, no tenderness/mass/nodules Back: symmetric, no curvature. ROM normal. No CVA tenderness. Lungs: clear to auscultation bilaterally Breasts: normal appearance, no masses or tenderness Heart: S1, S2 normal Abdomen: soft, non-tender; bowel sounds normal; no masses,  no organomegaly Pelvic: cervix normal in appearance, external genitalia normal, no adnexal masses or tenderness, no bladder tenderness, rectovaginal septum normal, uterus surgically absent, vagina normal without discharge and cystocele Extremities: extremities normal, atraumatic, no cyanosis or edema Pulses: 2+ and symmetric Skin: Skin color, texture, turgor normal. No rashes or lesions Lymph nodes: Cervical, supraclavicular, and axillary nodes normal. Neurologic: Alert and oriented X 3, normal strength and tone. Normal symmetric reflexes. Normal coordination and gait Psych--no depression, no anxiety      Assessment:     cpe      Plan:     During the course of the visit the patient was educated and counseled about  appropriate screening and preventive services including:    Pneumococcal vaccine   Influenza vaccine  Screening mammography  Screening Pap smear and pelvic exam   Bone densitometry screening  Colorectal cancer screening  Diabetes screening  Glaucoma screening  Nutrition counseling   Advanced directives: has an advanced directive - a copy HAS NOT been provided.  Diet review for nutrition referral? Yes ____  Not Indicated __x__   Patient Instructions (the written plan) was given to the patient.  Medicare Attestation I have personally reviewed: The patient's medical and social history Their use of alcohol, tobacco or illicit drugs Their current medications and supplements The patient's functional ability including ADLs,fall risks, home safety risks, cognitive, and hearing and visual impairment Diet and physical activities Evidence for depression or mood disorders  The patient's weight, height, BMI, and visual acuity have been recorded in the chart.  I have made referrals, counseling, and provided education to the patient based on review of the above and I have provided the patient with a written personalized care plan for preventive services.    1. Spondylosis of lumbosacral region without myelopathy or radiculopathy   - NONFORMULARY OR COMPOUNDED ITEM; Wheelchair  #1 diagnosis--osteoarthritis, back pain  Dispense: 1 each; Refill: 0  2. Essential hypertension Stable, con't meds - Basic metabolic panel - CBC with Differential - Hepatic function panel - Lipid panel - POCT urinalysis dipstick  3. Preventative health care   - zoster vaccine live, PF, (ZOSTAVAX) 39767 UNT/0.65ML injection; Inject 19,400 Units into the skin once.  Dispense: 1 vial; Refill: 0  4. Essential hypertension, benign Stable--- con't meds - amLODipine (NORVASC) 5 MG tablet; Take one tablet by mouth one time daily  Dispense: 90 tablet; Refill: 1  5. Medicare annual wellness visit,  subsequent    6. Screening for malignant neoplasm of the cervix   - Cytology - PAP  7. Need for pneumococcal vaccination   -  Pneumococcal conjugate vaccine 13-valent  8. Hematuria   - Urine Culture   Garnet Koyanagi, DO   05/18/2014

## 2014-05-18 NOTE — Addendum Note (Signed)
Addended by: [name] on: 05/18/2014 09:34 PM   Modules accepted: Level of Service

## 2014-05-19 LAB — CBC WITH DIFFERENTIAL/PLATELET
BASOS ABS: 0 10*3/uL (ref 0.0–0.1)
Basophils Relative: 0.4 % (ref 0.0–3.0)
EOS ABS: 0.1 10*3/uL (ref 0.0–0.7)
Eosinophils Relative: 1.5 % (ref 0.0–5.0)
HCT: 39.6 % (ref 36.0–46.0)
Hemoglobin: 13.1 g/dL (ref 12.0–15.0)
LYMPHS PCT: 27.7 % (ref 12.0–46.0)
Lymphs Abs: 1.8 10*3/uL (ref 0.7–4.0)
MCHC: 33 g/dL (ref 30.0–36.0)
MCV: 92.9 fl (ref 78.0–100.0)
MONOS PCT: 6.2 % (ref 3.0–12.0)
Monocytes Absolute: 0.4 10*3/uL (ref 0.1–1.0)
NEUTROS PCT: 64.2 % (ref 43.0–77.0)
Neutro Abs: 4.2 10*3/uL (ref 1.4–7.7)
Platelets: 184 10*3/uL (ref 150.0–400.0)
RBC: 4.27 Mil/uL (ref 3.87–5.11)
RDW: 15.4 % (ref 11.5–15.5)
WBC: 6.5 10*3/uL (ref 4.0–10.5)

## 2014-05-19 LAB — BASIC METABOLIC PANEL
BUN: 13 mg/dL (ref 6–23)
CO2: 27 meq/L (ref 19–32)
CREATININE: 0.9 mg/dL (ref 0.4–1.2)
Calcium: 9.4 mg/dL (ref 8.4–10.5)
Chloride: 105 mEq/L (ref 96–112)
GFR: 80.9 mL/min (ref >60.00)
GLUCOSE: 84 mg/dL (ref 70–99)
Potassium: 3.8 mEq/L (ref 3.5–5.1)
SODIUM: 139 meq/L (ref 135–145)

## 2014-05-19 LAB — LIPID PANEL
CHOL/HDL RATIO: 4
Cholesterol: 210 mg/dL — ABNORMAL HIGH (ref 0–200)
HDL: 52.5 mg/dL (ref >39.00)
LDL Cholesterol: 145 mg/dL — ABNORMAL HIGH (ref 0–99)
NONHDL: 157.5
Triglycerides: 63 mg/dL (ref 0.0–149.0)
VLDL: 12.6 mg/dL (ref 0.0–40.0)

## 2014-05-19 LAB — HEPATIC FUNCTION PANEL
ALK PHOS: 73 U/L (ref 39–117)
ALT: 18 U/L (ref 0–35)
AST: 28 U/L (ref 0–37)
Albumin: 3.9 g/dL (ref 3.5–5.2)
Bilirubin, Direct: 0.1 mg/dL (ref 0.0–0.3)
Total Bilirubin: 1 mg/dL (ref 0.2–1.2)
Total Protein: 7.3 g/dL (ref 6.0–8.3)

## 2014-05-19 LAB — URINE CULTURE: Colony Count: 30000

## 2014-05-20 ENCOUNTER — Telehealth: Payer: Self-pay | Admitting: Family Medicine

## 2014-05-20 NOTE — Telephone Encounter (Signed)
Rx was mailed 2 days ago.      KP

## 2014-05-20 NOTE — Telephone Encounter (Signed)
Pt was seen on tues, pt states she forgot to get her rx for a wheelchair, pt states she does not drive and would like to know if the rx can be mailed to her.

## 2014-05-20 NOTE — Telephone Encounter (Signed)
Patient has been mailed aware that Rx has been faxed     KP

## 2014-05-21 LAB — CYTOLOGY - PAP

## 2014-06-05 ENCOUNTER — Telehealth: Payer: Self-pay | Admitting: Hematology and Oncology

## 2014-06-05 NOTE — Telephone Encounter (Signed)
Lft msg for pt regarding labs/ov and MD change, mailed sch to [address] ...Marland KitchenMarland KitchenMarland Kitchen KJ

## 2014-06-09 NOTE — Telephone Encounter (Signed)
Pt is sch'd on 06-30-14

## 2014-06-23 ENCOUNTER — Telehealth: Payer: Self-pay | Admitting: Gastroenterology

## 2014-06-23 NOTE — Telephone Encounter (Signed)
She understands the colonoscopy is not usually done after the age of 71

## 2014-06-24 ENCOUNTER — Telehealth: Payer: Self-pay | Admitting: Gastroenterology

## 2014-06-24 NOTE — Telephone Encounter (Signed)
Has an active message already. Please see message from 06/23/14

## 2014-06-24 NOTE — Telephone Encounter (Signed)
Left this information for the patient in her voicemail. Advised to keep her appointment next week.

## 2014-06-24 NOTE — Telephone Encounter (Signed)
Ok.  [facility] should have an OV

## 2014-06-24 NOTE — Telephone Encounter (Signed)
The patient understands a colonoscopy is not usually done after the age of 26. She denies any problems other than constipation which she has under control. She wants to be certain she is doing all she can to take care of herself. Would she be a candidate for screening? An appointment was scheduled per her request for discussion, but she wants your thoughts on this. I did encourage her to contact her insurance company to be clear on what is covered.

## 2014-06-24 NOTE — Telephone Encounter (Signed)
If she is in recently good health and desires colonoscopy there is a reasonable thing to do.  Note that she was seen by Dr. Fuller Plan in 2012.  If he is her gastroenterologist I will leave that decision to him.

## 2014-06-24 NOTE — Telephone Encounter (Signed)
She would like you to be her GI. Her daughter is your patient.

## 2014-06-25 ENCOUNTER — Other Ambulatory Visit: Payer: Self-pay

## 2014-06-30 ENCOUNTER — Ambulatory Visit (INDEPENDENT_AMBULATORY_CARE_PROVIDER_SITE_OTHER): Payer: Medicare Other | Admitting: Gastroenterology

## 2014-06-30 ENCOUNTER — Encounter: Payer: Self-pay | Admitting: Gastroenterology

## 2014-06-30 VITALS — BP 154/72 | HR 84 | Ht 61.5 in | Wt 179.1 lb

## 2014-06-30 DIAGNOSIS — Z800 Family history of malignant neoplasm of digestive organs: Secondary | ICD-10-CM | POA: Insufficient documentation

## 2014-06-30 DIAGNOSIS — Z86010 Personal history of colonic polyps: Secondary | ICD-10-CM | POA: Insufficient documentation

## 2014-06-30 NOTE — Assessment & Plan Note (Signed)
Patient has inquired about repeating colonoscopy.  I explained that we gently stopped surveillance colonoscopy around 82 although this can be individualized depending upon his factors in the patient's overall health.  At the same time, patient is not due for followup colonoscopy for another 2 years which would provide a five-year followup from her last exam.  She has no symptoms suggestive of active colonic disease pushing me to do the exam earlier.  I explained that we can readdress the suitability and need for colonoscopy in 2 years.

## 2014-06-30 NOTE — Patient Instructions (Signed)
Follow up as needed

## 2014-06-30 NOTE — Progress Notes (Signed)
_                                                                                                                History of Present Illness:  Shannon Jensen here to discuss colonoscopy.  Family history is pertinent for her mother who developed colon cancer in her 82s.  Last colonoscopy in 2012 demonstrated a 30mm polyp which was removed.  The patient has no GI complaints including abdominal pain, change in bowel habits or rectal bleeding.   Past Medical History  Diagnosis Date  . Hypertension   . Osteoarthritis   . GERD (gastroesophageal reflux disease)   . Diverticulosis   . Anxiety and depression   . Breast cancer 12/ 2009    right breast  . Chronic headaches   . HLD (hyperlipidemia)   . Osteoporosis   . Pelvic relaxation   . Cystocele   . H/O vitamin D deficiency   . Arthritis   . Urinary tract infection 2014   Past Surgical History  Procedure Laterality Date  . Appendectomy  1970  . Breast surgery  2009    cancer-lymph node removal  . Neck surgery      cyst removal  . Abdominal hysterectomy  1998   family history includes Appendicitis in her daughter; Breast cancer in her sister; Colon cancer in her mother; Colon polyps in her daughter; Heart disease in her paternal uncle; Pancreatic cancer in her brother; Prostate cancer in her brother, father, and father; Stroke in her daughter. Current Outpatient Prescriptions  Medication Sig Dispense Refill  . acetaminophen (TYLENOL) 325 MG tablet Take 650 mg by mouth every 6 (six) hours as needed. For pain      . amLODipine (NORVASC) 5 MG tablet Take one tablet by mouth one time daily  90 tablet  1  . aspirin 81 MG tablet Take 81 mg by mouth daily.        . COD LIVER OIL PO Take 1 tablet by mouth daily.       Marland Kitchen ibuprofen (ADVIL,MOTRIN) 200 MG tablet Take 200 mg by mouth every 6 (six) hours as needed.      . Multiple Vitamins-Minerals (RA HAIR/SKIN/NAILS) TABS Take 1 tablet by mouth daily.        . Multiple  Vitamins-Minerals (WOMENS ONE DAILY) TABS Take 1 tablet by mouth daily.        . NONFORMULARY OR COMPOUNDED ITEM Wheelchair  #1 diagnosis--osteoarthritis, back pain  1 each  0  . Omega-3 Fatty Acids (FISH OIL) 1200 MG CAPS Take 1 capsule by mouth daily.        Marland Kitchen zoster vaccine live, PF, (ZOSTAVAX) 78588 UNT/0.65ML injection Inject 19,400 Units into the skin once.  1 vial  0   No current facility-administered medications for this visit.   Allergies as of 06/30/2014 - Review Complete 06/30/2014  Allergen Reaction Noted  . Celecoxib  04/13/2008  . Erythromycin  04/13/2008  . Iodine  06/19/2012  . Methocarbamol  07/31/2011  . Nabumetone  04/13/2008  . Nsaids  06/19/2012  . Vioxx [rofecoxib]  06/19/2012    reports that she has never smoked. She has never used smokeless tobacco. She reports that she does not drink alcohol or use illicit drugs.   Review of Systems: Pertinent positive and negative review of systems were noted in the above HPI section. All other review of systems were otherwise negative.  Vital signs were reviewed in today's medical record Physical Exam: General: Well developed , well nourished female appearing younger than her stated age Skin: anicteric Head: Normocephalic and atraumatic Eyes:  sclerae anicteric, EOMI Ears: Normal auditory acuity Mouth: No deformity or lesions Neck: Supple, no masses or thyromegaly Lungs: Clear throughout to auscultation Heart: Regular rate and rhythm; no murmurs, rubs or bruits Abdomen: Soft, non tender and non distended. No masses, hepatosplenomegaly or hernias noted. Normal Bowel sounds Rectal:deferred Musculoskeletal: Symmetrical with no gross deformities  Skin: No lesions on visible extremities Pulses:  Normal pulses noted Extremities: No clubbing, cyanosis, edema or deformities noted Neurological: Alert oriented x 4, grossly nonfocal Cervical Nodes:  No significant cervical adenopathy Inguinal Nodes: No significant inguinal  adenopathy Psychological:  Alert and cooperative. Normal mood and affect  See Assessment and Plan under Problem List

## 2014-07-12 ENCOUNTER — Encounter: Payer: Self-pay | Admitting: Gastroenterology

## 2014-07-15 ENCOUNTER — Other Ambulatory Visit: Payer: Self-pay | Admitting: Family Medicine

## 2014-07-15 DIAGNOSIS — Z9889 Other specified postprocedural states: Secondary | ICD-10-CM

## 2014-07-15 DIAGNOSIS — Z853 Personal history of malignant neoplasm of breast: Secondary | ICD-10-CM

## 2014-07-19 ENCOUNTER — Telehealth: Payer: Self-pay | Admitting: Family Medicine

## 2014-07-19 NOTE — Telephone Encounter (Signed)
Caller name: [place] Relation to pt: self Call back number:  [phone] Pharmacy:   Reason for call:   Please call patient. She would not discuss with me as to why she is calling.

## 2014-07-19 NOTE — Telephone Encounter (Signed)
Message left to call the office.    KP 

## 2014-07-21 NOTE — Telephone Encounter (Signed)
Msg left to call the office     KP 

## 2014-07-26 NOTE — Telephone Encounter (Signed)
Letter mailed     KP 

## 2014-08-17 ENCOUNTER — Ambulatory Visit
Admission: RE | Admit: 2014-08-17 | Discharge: 2014-08-17 | Disposition: A | Discharge status: Home / Self Care | Payer: Medicare Other | Source: Ambulatory Visit | Attending: Family Medicine | Admitting: Family Medicine

## 2014-08-17 DIAGNOSIS — Z853 Personal history of malignant neoplasm of breast: Secondary | ICD-10-CM

## 2014-08-17 DIAGNOSIS — Z9889 Other specified postprocedural states: Secondary | ICD-10-CM

## 2014-09-22 ENCOUNTER — Telehealth: Payer: Self-pay | Admitting: Gastroenterology

## 2014-09-22 NOTE — Telephone Encounter (Signed)
Patient is normally regular with bowel movements. Not prone to constipation. She had a 'belly ache'  That she treated with TUMS, baking soda with water and finally some aphrophilus natural cultures. When she had a BM, there was blood in it and her abdomen stayed sore. Appointment made for evaluation.

## 2014-09-23 ENCOUNTER — Ambulatory Visit (INDEPENDENT_AMBULATORY_CARE_PROVIDER_SITE_OTHER): Payer: Medicare Other | Admitting: Gastroenterology

## 2014-09-23 ENCOUNTER — Encounter: Payer: Self-pay | Admitting: Gastroenterology

## 2014-09-23 VITALS — BP 132/76 | HR 80 | Ht 62.0 in | Wt 171.8 lb

## 2014-09-23 DIAGNOSIS — R109 Unspecified abdominal pain: Secondary | ICD-10-CM

## 2014-09-23 DIAGNOSIS — K648 Other hemorrhoids: Secondary | ICD-10-CM | POA: Insufficient documentation

## 2014-09-23 DIAGNOSIS — K625 Hemorrhage of anus and rectum: Secondary | ICD-10-CM

## 2014-09-23 MED ORDER — HYDROCORTISONE ACETATE 25 MG RE SUPP: 25.0000 mg | Freq: Two times a day (BID) | RECTAL | Status: DC

## 2014-09-23 NOTE — Patient Instructions (Signed)
Follow up as needed. We have sent  medications to your pharmacy for you to pick up at your convenience. CC:  Garnet Koyanagi MD

## 2014-09-23 NOTE — Progress Notes (Signed)
     09/23/2014 AHMIYAH COIL [id number] 02/04/1928   History of Present Illness:  This is a pleasant 81 year old female who is known to Dr. Deatra Ina.  Her last colonoscopy was in October 2012 at which time she was found to have an 8 mm sessile polyp in the ascending colon, and a 2 cm lipoma in the ascending colon.  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  She presents to our office today with complaints of abdominal pain and rectal bleeding.  She states that her abdominal pain began in her mid abdomen on Monday and continued into Tuesday. She described it as a stomachache and took a probiotic, Tums, and baking soda with water to try to relieve it. Then, eventually she had several stools that were not diarrhea, but they did actually require some straining to pass them. She said eventually the stools became soft and then she saw some blood described as bright red blood on the toilet paper. She saw the blood again yesterday evening when she wiped her bottom, but she did not have a bowel movement at that time. She is not seen any blood today and has not had a bowel movement. She says that her stomach feels better but still does feel somewhat sore and irritated.  She thought maybe she ate some bad food or something that did not agree with her at a church function.  Current Medications, Allergies, Past Medical History, Past Surgical History, Family History and Social History were reviewed in [employer] record.   Physical Exam: BP 132/76 mmHg  Pulse 80  Ht 5\' 2"  (1.575 m)  Wt 171 lb 12.8 oz (77.928 kg)  BMI 31.41 kg/m2 General: Well developed black female in no acute distress Head: Normocephalic and atraumatic Eyes:  Sclerae anicteric, conjunctiva pink  Ears: Normal auditory acuity Lungs: Clear throughout to auscultation Heart: Regular rate and rhythm Abdomen: Soft, non-distended.  Normal bowel sounds.  Non-tender. Rectal:  No external  hemorrhoids noted.  DRE did not reveal any masses; there was a small amount of red blood on the exam glove, obviously heme positive.  Anoscopy revealed internal hemorrhoids, one with appearance of a clot present.   Musculoskeletal: Symmetrical with no gross deformities  Extremities: No edema  Neurological: Alert oriented x 4, grossly non-focal Psychological:  Alert and cooperative. Normal mood and affect  Assessment and Recommendations: -Rectal bleeding:  From internal hemorrhoid noted on anoscopy exam.  Will treat with anusol suppositories BID for 7 days.   -Abdominal pain:  Resolving.  ? If this was an infectious gastroenteritis, etc.  Will call back if pain returns, persists or worsens.

## 2014-09-24 NOTE — Progress Notes (Signed)
Reviewed and agree with management. Robert D. Kaplan, M.D., FACG  

## 2014-10-05 ENCOUNTER — Ambulatory Visit: Payer: Medicare Other | Admitting: Hematology and Oncology

## 2014-10-05 ENCOUNTER — Other Ambulatory Visit: Payer: Medicare Other

## 2014-10-07 ENCOUNTER — Other Ambulatory Visit: Payer: Medicare Other

## 2014-10-07 ENCOUNTER — Ambulatory Visit: Payer: Medicare Other | Admitting: Oncology

## 2014-10-13 ENCOUNTER — Encounter: Payer: Self-pay | Admitting: Gastroenterology

## 2014-10-13 ENCOUNTER — Ambulatory Visit (INDEPENDENT_AMBULATORY_CARE_PROVIDER_SITE_OTHER): Payer: Medicare Other | Admitting: Gastroenterology

## 2014-10-13 VITALS — BP 144/80 | HR 69 | Ht 63.0 in | Wt 172.2 lb

## 2014-10-13 DIAGNOSIS — R1013 Epigastric pain: Secondary | ICD-10-CM

## 2014-10-13 DIAGNOSIS — K625 Hemorrhage of anus and rectum: Secondary | ICD-10-CM

## 2014-10-13 MED ORDER — PANTOPRAZOLE SODIUM 40 MG PO TBEC: 40.0000 mg | DELAYED_RELEASE_TABLET | Freq: Every day | ORAL | Status: DC

## 2014-10-13 NOTE — Patient Instructions (Signed)

## 2014-10-13 NOTE — Assessment & Plan Note (Signed)
2-1/2 week history of persistent upper abdominal pain slightly improved with antacids.  Ulcer nonulcer dyspepsia are considerations.  She looks well and I doubt she is has an active intra-abdominal process.  Recommendations #1 begin Protonix 40 daily #2 upper endoscopy

## 2014-10-13 NOTE — Progress Notes (Signed)
      History of Present Illness:  Ms. Gillham has returned evaluation of abdominal pain.  She was seen approximately 2 1/2 weeks ago for upper abdominal pain and rectal bleeding.  She has some straining at the stool and bleeding was felt to be do to hemorrhoids.  Anoscopy demonstrated internal hemorrhoids.  Bleeding stopped soon after the office visit.  Upper abdominal pain has persisted.  It may be exacerbated by eating although this is variable.  She initially took antacids with partial relief.  It has been persistent.  Pain is without radiation.  She has occasional pyrosis.  She denies nausea or vomiting.  She is on no regular gastric irritants including nonsteroidals.    Review of Systems: Pertinent positive and negative review of systems were noted in the above HPI section. All other review of systems were otherwise negative.    Current Medications, Allergies, Past Medical History, Past Surgical History, Family History and Social History were reviewed in [facility] record  Vital signs were reviewed in today's medical record. Physical Exam: General: Well developed , well nourished, no acute distress Skin: anicteric Head: Normocephalic and atraumatic Eyes:  sclerae anicteric, EOMI Ears: Normal auditory acuity Mouth: No deformity or lesions Lungs: Clear throughout to auscultation Heart: Regular rate and rhythm; no murmurs, rubs or bruits Abdomen: Soft,and non distended. No masses, hepatosplenomegaly or hernias noted. Normal Bowel sounds.  There is minimal tenderness to palpation in the upper midepigastrium Rectal: No external lesions Musculoskeletal: Symmetrical with no gross deformities  Pulses:  Normal pulses noted Extremities: No clubbing, cyanosis, edema or deformities noted Neurological: Alert oriented x 4, grossly nonfocal Psychological:  Alert and cooperative. Normal mood and affect  See Assessment and Plan under Problem List

## 2014-10-13 NOTE — Assessment & Plan Note (Signed)
Limited rectal bleeding likely was secondary to hemorrhoids.  Bleeding has stopped.

## 2014-10-14 ENCOUNTER — Telehealth: Payer: Self-pay | Admitting: Hematology and Oncology

## 2014-10-14 NOTE — Telephone Encounter (Signed)
Patient confirmed appoitment for February 29. Will call back if unable to have someone bring.

## 2014-10-21 ENCOUNTER — Other Ambulatory Visit: Payer: Self-pay

## 2014-10-21 ENCOUNTER — Encounter: Payer: Self-pay | Admitting: Gastroenterology

## 2014-10-21 ENCOUNTER — Telehealth: Payer: Self-pay

## 2014-10-21 ENCOUNTER — Ambulatory Visit (AMBULATORY_SURGERY_CENTER): Payer: Medicare Other | Admitting: Gastroenterology

## 2014-10-21 VITALS — BP 164/76 | HR 81 | Temp 98.4°F | Resp 16

## 2014-10-21 DIAGNOSIS — R112 Nausea with vomiting, unspecified: Secondary | ICD-10-CM

## 2014-10-21 DIAGNOSIS — K222 Esophageal obstruction: Secondary | ICD-10-CM

## 2014-10-21 DIAGNOSIS — R1013 Epigastric pain: Secondary | ICD-10-CM

## 2014-10-21 MED ORDER — SODIUM CHLORIDE 0.9 % IV SOLN: 500.0000 mL | INTRAVENOUS | Status: DC

## 2014-10-21 NOTE — Op Note (Signed)
[place]  [address] [place], [id number]   ENDOSCOPY PROCEDURE REPORT  PATIENT: Shannon, Jensen  MR#: [id number] BIRTHDATE: 10-18-1927 , 17  yrs. old GENDER: female ENDOSCOPIST: Inda Castle, MD REFERRED BY:  Garnet Koyanagi, DO PROCEDURE DATE:  10/21/2014 PROCEDURE:  EGD, diagnostic ASA CLASS:     Class II INDICATIONS:  nausea, vomiting, and dyspepsia. MEDICATIONS: Monitored anesthesia care and Propofol 100 mg IV TOPICAL ANESTHETIC:  DESCRIPTION OF PROCEDURE: After the risks benefits and alternatives of the procedure were thoroughly explained, informed consent was obtained.  The [id number] endoscope was introduced through the mouth and advanced to the third portion of the duodenum , Without limitations.  The instrument was slowly withdrawn as the mucosa was fully examined.    Findings:: There was a benign appearing stricture at the gastroesophageal junction.  The stricture was easily traversable. There was a moderate amount of retained, solid gastric contents. Except for the findings listed the EGD was otherwise normal. Retroflexed views revealed no abnormalities.     The scope was then withdrawn from the patient and the procedure completed.  COMPLICATIONS: There were no immediate complications.  ENDOSCOPIC IMPRESSION: No 1.  retained gastric contents?"rule out gastroparesis 2.  Early esophageal stricture  RECOMMENDATIONS: Gastric emptying scan  REPEAT EXAM:  eSigned:  Inda Castle, MD 10/21/2014 7:57 AM    CC:

## 2014-10-21 NOTE — Telephone Encounter (Signed)
Based upon her EGD today, Dr Deatra Ina recommends to have a gastric emptying study done for the patient. Study scheduled for 11/05/14 at arrive at 9:15 am. Message left for a return call.

## 2014-10-21 NOTE — Progress Notes (Signed)
Report to PACU, RN, vss, BBS= Clear.  

## 2014-10-21 NOTE — Patient Instructions (Signed)
Dr. Kelby Fam office will call you with an appointment for a Gastric Emptying Study.  YOU HAD AN ENDOSCOPIC PROCEDURE TODAY AT [facility]: Refer to the procedure report that was given to you for any specific questions about what was found during the examination.  If the procedure report does not answer your questions, please call your gastroenterologist to clarify.  If you requested that your care partner not be given the details of your procedure findings, then the procedure report has been included in a sealed envelope for you to review at your convenience later.  YOU SHOULD EXPECT: Some feelings of bloating in the abdomen. Passage of more gas than usual.  Walking can help get rid of the air that was put into your GI tract during the procedure and reduce the bloating. If you had a lower endoscopy (such as a colonoscopy or flexible sigmoidoscopy) you may notice spotting of blood in your stool or on the toilet paper. If you underwent a bowel prep for your procedure, then you may not have a normal bowel movement for a few days.  DIET: Your first meal following the procedure should be a light meal and then it is ok to progress to your normal diet.  A half-sandwich or bowl of soup is an example of a good first meal.  Heavy or fried foods are harder to digest and may make you feel nauseous or bloated.  Likewise meals heavy in dairy and vegetables can cause extra gas to form and this can also increase the bloating.  Drink plenty of fluids but you should avoid alcoholic beverages for 24 hours.  ACTIVITY: Your care partner should take you home directly after the procedure.  You should plan to take it easy, moving slowly for the rest of the day.  You can resume normal activity the day after the procedure however you should NOT DRIVE or use heavy machinery for 24 hours (because of the sedation medicines used during the test).    SYMPTOMS TO REPORT IMMEDIATELY: A gastroenterologist can be reached  at any hour.  During normal business hours, 8:30 AM to 5:00 PM Monday through Friday, call [phone].  After hours and on weekends, please call the GI answering service at [phone] who will take a message and have the physician on call contact you.   Following upper endoscopy (EGD)  Vomiting of blood or coffee ground material  New chest pain or pain under the shoulder blades  Painful or persistently difficult swallowing  New shortness of breath  Fever of 100F or higher  Black, tarry-looking stools  FOLLOW UP: If any biopsies were taken you will be contacted by phone or by letter within the next 1-3 weeks.  Call your gastroenterologist if you have not heard about the biopsies in 3 weeks.  Our staff will call the home number listed on your records the next business day following your procedure to check on you and address any questions or concerns that you may have at that time regarding the information given to you following your procedure. This is a courtesy call and so if there is no answer at the home number and we have not heard from you through the emergency physician on call, we will assume that you have returned to your regular daily activities without incident.  SIGNATURES/CONFIDENTIALITY: You and/or your care partner have signed paperwork which will be entered into your electronic medical record.  These signatures attest to the fact that that the information  above on your After Visit Summary has been reviewed and is understood.  Full responsibility of the confidentiality of this discharge information lies with you and/or your care-partner. 

## 2014-10-22 ENCOUNTER — Telehealth: Payer: Self-pay

## 2014-10-22 NOTE — Telephone Encounter (Signed)
Patient aware.

## 2014-10-22 NOTE — Telephone Encounter (Signed)
  Follow up Call-  Call back number 10/21/2014  Post procedure Call Back phone  # [phone]  Permission to leave phone message Yes     Patient questions:  Do you have a fever, pain , or abdominal swelling? No. Pain Score  0 *  Have you tolerated food without any problems? Yes.    Have you been able to return to your normal activities? Yes.    Do you have any questions about your discharge instructions: Diet   No. Medications  No. Follow up visit  No.  Do you have questions or concerns about your Care? No.  Actions: * If pain score is 4 or above: No action needed, pain <4.

## 2014-10-26 ENCOUNTER — Telehealth: Payer: Self-pay | Admitting: Gastroenterology

## 2014-10-26 NOTE — Telephone Encounter (Signed)
Calling to ask if she has to have anyone stay with her during the [place]. Advised she will not need a care partner. Reminded to expect to be there for 4 hours.

## 2014-11-05 ENCOUNTER — Encounter (HOSPITAL_COMMUNITY): Payer: Self-pay

## 2014-11-05 ENCOUNTER — Ambulatory Visit (HOSPITAL_COMMUNITY)
Admission: RE | Admit: 2014-11-05 | Discharge: 2014-11-05 | Disposition: A | Discharge status: Home / Self Care | Payer: Medicare Other | Source: Ambulatory Visit | Attending: Gastroenterology | Admitting: Gastroenterology

## 2014-11-05 ENCOUNTER — Other Ambulatory Visit: Payer: Self-pay | Admitting: *Deleted

## 2014-11-05 DIAGNOSIS — R112 Nausea with vomiting, unspecified: Secondary | ICD-10-CM | POA: Diagnosis not present

## 2014-11-05 DIAGNOSIS — C50919 Malignant neoplasm of unspecified site of unspecified female breast: Secondary | ICD-10-CM

## 2014-11-05 DIAGNOSIS — R1013 Epigastric pain: Secondary | ICD-10-CM | POA: Diagnosis not present

## 2014-11-05 DIAGNOSIS — R109 Unspecified abdominal pain: Secondary | ICD-10-CM | POA: Insufficient documentation

## 2014-11-05 MED ORDER — TECHNETIUM TC 99M SULFUR COLLOID
2.1000 | Freq: Once | INTRAVENOUS | Status: AC | PRN
  Administered 2014-11-05: 2.1 via ORAL

## 2014-11-07 NOTE — Progress Notes (Signed)
Quick Note:  Please inform the patient that GES was normal and to continue current plan of action ______ 

## 2014-11-08 ENCOUNTER — Other Ambulatory Visit (HOSPITAL_BASED_OUTPATIENT_CLINIC_OR_DEPARTMENT_OTHER): Payer: Medicare Other

## 2014-11-08 ENCOUNTER — Ambulatory Visit (HOSPITAL_BASED_OUTPATIENT_CLINIC_OR_DEPARTMENT_OTHER): Payer: Medicare Other | Admitting: Hematology and Oncology

## 2014-11-08 ENCOUNTER — Telehealth: Payer: Self-pay | Admitting: Hematology and Oncology

## 2014-11-08 VITALS — BP 156/79 | HR 86 | Temp 98.2°F | Resp 18 | Ht 63.0 in | Wt 171.0 lb

## 2014-11-08 DIAGNOSIS — C50919 Malignant neoplasm of unspecified site of unspecified female breast: Secondary | ICD-10-CM

## 2014-11-08 DIAGNOSIS — Z853 Personal history of malignant neoplasm of breast: Secondary | ICD-10-CM

## 2014-11-08 DIAGNOSIS — C50911 Malignant neoplasm of unspecified site of right female breast: Secondary | ICD-10-CM

## 2014-11-08 LAB — CBC WITH DIFFERENTIAL/PLATELET
BASO%: 0.7 % (ref 0.0–2.0)
Basophils Absolute: 0 10*3/uL (ref 0.0–0.1)
EOS%: 3.6 % (ref 0.0–7.0)
Eosinophils Absolute: 0.2 10*3/uL (ref 0.0–0.5)
HCT: 39.7 % (ref 34.8–46.6)
HGB: 12.6 g/dL (ref 11.6–15.9)
LYMPH%: 26.9 % (ref 14.0–49.7)
MCH: 29.6 pg (ref 25.1–34.0)
MCHC: 31.8 g/dL (ref 31.5–36.0)
MCV: 93 fL (ref 79.5–101.0)
MONO#: 0.6 10*3/uL (ref 0.1–0.9)
MONO%: 10.3 % (ref 0.0–14.0)
NEUT#: 3.6 10*3/uL (ref 1.5–6.5)
NEUT%: 58.5 % (ref 38.4–76.8)
Platelets: 203 10*3/uL (ref 145–400)
RBC: 4.27 10*6/uL (ref 3.70–5.45)
RDW: 15.2 % — AB (ref 11.2–14.5)
WBC: 6.1 10*3/uL (ref 3.9–10.3)
lymph#: 1.6 10*3/uL (ref 0.9–3.3)

## 2014-11-08 LAB — COMPREHENSIVE METABOLIC PANEL (CC13)
ALBUMIN: 3.5 g/dL (ref 3.5–5.0)
ALT: 14 U/L (ref 0–55)
ANION GAP: 6 meq/L (ref 3–11)
AST: 20 U/L (ref 5–34)
Alkaline Phosphatase: 77 U/L (ref 40–150)
BILIRUBIN TOTAL: 0.61 mg/dL (ref 0.20–1.20)
BUN: 14.2 mg/dL (ref 7.0–26.0)
CALCIUM: 9.3 mg/dL (ref 8.4–10.4)
CHLORIDE: 110 meq/L — AB (ref 98–109)
CO2: 26 meq/L (ref 22–29)
Creatinine: 1 mg/dL (ref 0.6–1.1)
EGFR: 63 mL/min/{1.73_m2} — AB (ref >90)
GLUCOSE: 97 mg/dL (ref 70–140)
Potassium: 3.8 mEq/L (ref 3.5–5.1)
SODIUM: 141 meq/L (ref 136–145)
TOTAL PROTEIN: 6.8 g/dL (ref 6.4–8.3)

## 2014-11-08 NOTE — Telephone Encounter (Signed)
per pof to sch pt appt-gave pt copy of sch °

## 2014-11-08 NOTE — Assessment & Plan Note (Signed)
Right breast invasive ductal carcinoma T1b N0 M0 stage IA diagnosis in 2009 status post lumpectomy 05/24/2008 ER positive PR positive HER-2 negative status post adjuvant radiation and took 5 years of antiestrogen therapy with tamoxifen completed 10/08/2013 Patient was offered extended adjuvant therapy but she declined   Breast cancer surveillance: 1.  Mammogram December 2015 is normal 2.  Breast exam 11/08/2014 is normal   I recommended  Once a year follow-up  With our survivorship clinic.

## 2014-11-08 NOTE — Progress Notes (Signed)
Patient Care Team: [name], DO as PCP - General  DIAGNOSIS: No matching staging information was found for the patient.  SUMMARY OF ONCOLOGIC HISTORY:   Breast cancer, right breast   05/24/2008 Surgery  Right breast lumpectomy revealing 0.9 cm grade 1 invasive ductal carcinoma with negative margins, ER positive, PR positive, HER-2 negative   06/29/2008 - 08/06/2008 Radiation Therapy  adjuvant radiation therapy   10/11/2008 - 10/08/2013 Anti-estrogen oral therapy  antiestrogen therapy with Femara then switched to tamoxifen due to osteoporosis completed 5 years of tamoxifen , patient declined extended adjuvant therapy.    CHIEF COMPLIANT:  Follow-up of breast cancer  INTERVAL HISTORY: Shannon Jensen is a  81 year old lady with above-mentioned history of right-sided breast cancer treated with lumpectomy and radiation and  took oral antiestrogen therapy with Femara  Later changed to tamoxifen completed 10/08/2013. She is here for follow-up exam. She did does not have any new lumps or nodules in the breasts. She had a mammogram that was normal.  REVIEW OF SYSTEMS:   Constitutional: Denies fevers, chills or abnormal weight loss Eyes: Denies blurriness of vision Ears, nose, mouth, throat, and face: Denies mucositis or sore throat Respiratory: Denies cough, dyspnea or wheezes Cardiovascular: Denies palpitation, chest discomfort or lower extremity swelling Gastrointestinal:  Denies nausea, heartburn or change in bowel habits Skin: Denies abnormal skin rashes Lymphatics: Denies new lymphadenopathy or easy bruising Neurological:Denies numbness, tingling or new weaknesses Behavioral/Psych: Mood is stable, no new changes  Breast:  denies any pain or lumps or nodules in either breasts All other systems were reviewed with the patient and are negative.  I have reviewed the past medical history, past surgical history, social history and family history with the patient and they are unchanged from  previous note.  ALLERGIES:  is allergic to celecoxib; erythromycin; iodine; methocarbamol; nabumetone; nsaids; and vioxx.  MEDICATIONS:  Current Outpatient Prescriptions  Medication Sig Dispense Refill  . acetaminophen (TYLENOL) 325 MG tablet Take 650 mg by mouth every 6 (six) hours as needed. For pain    . amLODipine (NORVASC) 5 MG tablet Take one tablet by mouth one time daily 90 tablet 1  . aspirin 81 MG tablet Take 81 mg by mouth daily.      . COD LIVER OIL PO Take 1 tablet by mouth daily.     Marland Kitchen ibuprofen (ADVIL,MOTRIN) 200 MG tablet Take 200 mg by mouth every 6 (six) hours as needed.    . Multiple Vitamins-Minerals (RA HAIR/SKIN/NAILS) TABS Take 1 tablet by mouth daily.      . Multiple Vitamins-Minerals (WOMENS ONE DAILY) TABS Take 1 tablet by mouth daily.      . NONFORMULARY OR COMPOUNDED ITEM Wheelchair  #1 diagnosis--osteoarthritis, back pain 1 each 0  . Omega-3 Fatty Acids (FISH OIL) 1200 MG CAPS Take 1 capsule by mouth daily.       No current facility-administered medications for this visit.    PHYSICAL EXAMINATION: ECOG PERFORMANCE STATUS: 0 - Asymptomatic  Filed Vitals:   11/08/14 1435  BP: 156/79  Pulse: 86  Temp: 98.2 F (36.8 C)  Resp: 18   Filed Weights   11/08/14 1435  Weight: 171 lb (77.565 kg)    GENERAL:alert, no distress and comfortable SKIN: skin color, texture, turgor are normal, no rashes or significant lesions EYES: normal, Conjunctiva are pink and non-injected, sclera clear OROPHARYNX:no exudate, no erythema and lips, buccal mucosa, and tongue normal  NECK: supple, thyroid normal size, non-tender, without nodularity LYMPH:  no palpable  lymphadenopathy in the cervical, axillary or inguinal LUNGS: clear to auscultation and percussion with normal breathing effort HEART: regular rate & rhythm and no murmurs and no lower extremity edema ABDOMEN:abdomen soft, non-tender and normal bowel sounds Musculoskeletal:no cyanosis of digits and no clubbing   NEURO: alert & oriented x 3 with fluent speech, no focal motor/sensory deficits BREAST: No palpable masses or nodules in either right or left breasts. No palpable axillary supraclavicular or infraclavicular adenopathy no breast tenderness or nipple discharge. (exam performed in the presence of a chaperone)  LABORATORY DATA:  I have reviewed the data as listed   Chemistry      Component Value Date/Time   NA 141 11/08/2014 1422   NA 139 05/18/2014 1437   K 3.8 11/08/2014 1422   K 3.8 05/18/2014 1437   CL 105 05/18/2014 1437   CL 107 02/04/2013 1346   CO2 26 11/08/2014 1422   CO2 27 05/18/2014 1437   BUN 14.2 11/08/2014 1422   BUN 13 05/18/2014 1437   CREATININE 1.0 11/08/2014 1422   CREATININE 0.9 05/18/2014 1437      Component Value Date/Time   CALCIUM 9.3 11/08/2014 1422   CALCIUM 9.4 05/18/2014 1437   ALKPHOS 77 11/08/2014 1422   ALKPHOS 73 05/18/2014 1437   AST 20 11/08/2014 1422   AST 28 05/18/2014 1437   ALT 14 11/08/2014 1422   ALT 18 05/18/2014 1437   BILITOT 0.61 11/08/2014 1422   BILITOT 1.0 05/18/2014 1437       Lab Results  Component Value Date   WBC 6.1 11/08/2014   HGB 12.6 11/08/2014   HCT 39.7 11/08/2014   MCV 93.0 11/08/2014   PLT 203 11/08/2014   NEUTROABS 3.6 11/08/2014     RADIOGRAPHIC STUDIES: I have personally reviewed the radiology reports and agreed with their findings.  Mammogram December 2015 is normal   ASSESSMENT & PLAN:  Breast cancer, right breast  Right breast invasive ductal carcinoma T1b N0 M0 stage IA diagnosis in 2009 status post lumpectomy 05/24/2008 ER positive PR positive HER-2 negative status post adjuvant radiation and took 5 years of antiestrogen therapy with tamoxifen completed 10/08/2013 Patient was offered extended adjuvant therapy but she declined   Breast cancer surveillance: 1.  Mammogram December 2015 is normal 2.  Breast exam 11/08/2014 is normal   I recommended  Once a year follow-up .      Orders  Placed This Encounter  Procedures  . CBC with Differential    Standing Status: Future     Number of Occurrences:      Standing Expiration Date: 11/08/2015  . Comprehensive metabolic panel (Cmet) - [facility]    Standing Status: Future     Number of Occurrences:      Standing Expiration Date: 11/08/2015   The patient has a good understanding of the overall plan. she agrees with it. She will call with any problems that may develop before her next visit here.   Rulon Eisenmenger, MD

## 2014-11-09 ENCOUNTER — Telehealth: Payer: Self-pay

## 2014-11-09 ENCOUNTER — Other Ambulatory Visit: Payer: Self-pay

## 2014-11-09 NOTE — Telephone Encounter (Signed)
No ibuprofen. Begin hyomax 0.375mg  bid x 5 days then prn C/b if no better by end of week.

## 2014-11-09 NOTE — Telephone Encounter (Signed)
Advised of normal GES.  Patient stopped the Protonix as directed at previous visit. She reports a continued "irritation" of her stomach. She denies nausea. She has a pain that comes and goes. She thinks it is a GI issue. Please advise.

## 2014-11-10 ENCOUNTER — Other Ambulatory Visit: Payer: Self-pay

## 2014-11-10 MED ORDER — HYOSCYAMINE SULFATE ER 0.375 MG PO TBCR: 1.0000 | EXTENDED_RELEASE_TABLET | Freq: Two times a day (BID) | ORAL | Status: DC

## 2014-11-10 NOTE — Telephone Encounter (Signed)
Patient notified

## 2014-12-13 ENCOUNTER — Other Ambulatory Visit: Payer: Self-pay | Admitting: Family Medicine

## 2015-07-01 ENCOUNTER — Other Ambulatory Visit: Payer: Self-pay

## 2015-07-01 NOTE — Telephone Encounter (Signed)
Please schedule an apt or CPE with Dr.Lowne and forward back to me.     KP

## 2015-07-05 MED ORDER — AMLODIPINE BESYLATE 5 MG PO TABS: 5.0000 mg | ORAL_TABLET | Freq: Every day | ORAL | Status: DC

## 2015-07-06 ENCOUNTER — Telehealth: Payer: Self-pay | Admitting: Family Medicine

## 2015-07-06 ENCOUNTER — Other Ambulatory Visit: Payer: Self-pay | Admitting: Family Medicine

## 2015-07-06 ENCOUNTER — Other Ambulatory Visit: Payer: Self-pay

## 2015-07-06 DIAGNOSIS — Z853 Personal history of malignant neoplasm of breast: Secondary | ICD-10-CM

## 2015-07-06 DIAGNOSIS — Z1239 Encounter for other screening for malignant neoplasm of breast: Secondary | ICD-10-CM

## 2015-07-06 DIAGNOSIS — Z13828 Encounter for screening for other musculoskeletal disorder: Secondary | ICD-10-CM

## 2015-07-06 NOTE — Telephone Encounter (Signed)
Referral placed and pt will be called.

## 2015-07-06 NOTE — Telephone Encounter (Signed)
Pt called stating she has mammogram 08/25/15 and wants bone density done at the same time. She is going to [facility]. She said we need to send order or referral.

## 2015-07-07 NOTE — Telephone Encounter (Signed)
LVM advising patient to schedule appointment  °

## 2015-08-09 ENCOUNTER — Ambulatory Visit (INDEPENDENT_AMBULATORY_CARE_PROVIDER_SITE_OTHER): Payer: Medicare Other | Admitting: Family Medicine

## 2015-08-09 ENCOUNTER — Other Ambulatory Visit: Payer: Self-pay | Admitting: *Deleted

## 2015-08-09 ENCOUNTER — Encounter: Payer: Self-pay | Admitting: Family Medicine

## 2015-08-09 VITALS — BP 148/74 | HR 83 | Ht 63.0 in | Wt 176.0 lb

## 2015-08-09 DIAGNOSIS — I10 Essential (primary) hypertension: Secondary | ICD-10-CM | POA: Diagnosis not present

## 2015-08-09 DIAGNOSIS — Z853 Personal history of malignant neoplasm of breast: Secondary | ICD-10-CM

## 2015-08-09 MED ORDER — AMLODIPINE BESYLATE 5 MG PO TABS: 5.0000 mg | ORAL_TABLET | Freq: Every day | ORAL | Status: DC
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
## 2015-08-25 ENCOUNTER — Ambulatory Visit
Admission: RE | Admit: 2015-08-25 | Discharge: 2015-08-25 | Disposition: A | Discharge status: Home / Self Care | Payer: Medicare Other | Source: Ambulatory Visit | Attending: Family Medicine | Admitting: Family Medicine

## 2015-08-25 DIAGNOSIS — Z853 Personal history of malignant neoplasm of breast: Secondary | ICD-10-CM

## 2015-08-25 DIAGNOSIS — Z13828 Encounter for screening for other musculoskeletal disorder: Secondary | ICD-10-CM

## 2015-09-14 ENCOUNTER — Telehealth: Payer: Self-pay | Admitting: *Deleted

## 2015-09-14 ENCOUNTER — Telehealth: Payer: Self-pay

## 2015-09-14 NOTE — Telephone Encounter (Signed)
Pt called with questions about dates of surgery, date of last visit & next appt.  Questions answered & she asked if she can get a copy of her medical records.  Informed that I would let Dr Lindi Adie know of request & send request to HIM.  Routed to Dr Lorinda Creed & called HIM

## 2015-09-14 NOTE — Telephone Encounter (Signed)
Medical Records sent to [facility] on 09/13/15

## 2015-09-15 MED ORDER — ALENDRONATE SODIUM 70 MG PO TABS: 70.0000 mg | ORAL_TABLET | ORAL | Status: DC

## 2015-09-30 ENCOUNTER — Encounter: Payer: Self-pay | Admitting: Family Medicine

## 2015-09-30 ENCOUNTER — Ambulatory Visit (INDEPENDENT_AMBULATORY_CARE_PROVIDER_SITE_OTHER): Payer: Medicare Other | Admitting: Family Medicine

## 2015-09-30 VITALS — BP 122/72 | HR 64 | Ht 62.5 in | Wt 172.4 lb

## 2015-09-30 DIAGNOSIS — M1990 Unspecified osteoarthritis, unspecified site: Secondary | ICD-10-CM

## 2015-09-30 DIAGNOSIS — Z853 Personal history of malignant neoplasm of breast: Secondary | ICD-10-CM | POA: Diagnosis not present

## 2015-09-30 DIAGNOSIS — M810 Age-related osteoporosis without current pathological fracture: Secondary | ICD-10-CM

## 2015-09-30 DIAGNOSIS — I10 Essential (primary) hypertension: Secondary | ICD-10-CM | POA: Diagnosis not present

## 2015-09-30 DIAGNOSIS — Z0000 Encounter for general adult medical examination without abnormal findings: Secondary | ICD-10-CM | POA: Diagnosis not present

## 2015-09-30 DIAGNOSIS — K219 Gastro-esophageal reflux disease without esophagitis: Secondary | ICD-10-CM

## 2015-09-30 DIAGNOSIS — Z8639 Personal history of other endocrine, nutritional and metabolic disease: Secondary | ICD-10-CM | POA: Diagnosis not present

## 2015-09-30 DIAGNOSIS — E785 Hyperlipidemia, unspecified: Secondary | ICD-10-CM

## 2015-09-30 LAB — CBC WITH DIFFERENTIAL/PLATELET
BASOS ABS: 0 10*3/uL (ref 0.0–0.1)
BASOS PCT: 0 % (ref 0–1)
EOS PCT: 2 % (ref 0–5)
Eosinophils Absolute: 0.1 10*3/uL (ref 0.0–0.7)
HEMATOCRIT: 41.6 % (ref 36.0–46.0)
HEMOGLOBIN: 13.7 g/dL (ref 12.0–15.0)
LYMPHS PCT: 38 % (ref 12–46)
Lymphs Abs: 2.8 10*3/uL (ref 0.7–4.0)
MCH: 30.2 pg (ref 26.0–34.0)
MCHC: 32.9 g/dL (ref 30.0–36.0)
MCV: 91.8 fL (ref 78.0–100.0)
MONO ABS: 0.6 10*3/uL (ref 0.1–1.0)
MPV: 10.7 fL (ref 8.6–12.4)
Monocytes Relative: 8 % (ref 3–12)
NEUTROS ABS: 3.8 10*3/uL (ref 1.7–7.7)
Neutrophils Relative %: 52 % (ref 43–77)
PLATELETS: 218 10*3/uL (ref 150–400)
RBC: 4.53 MIL/uL (ref 3.87–5.11)
RDW: 15.1 % (ref 11.5–15.5)
WBC: 7.3 10*3/uL (ref 4.0–10.5)

## 2015-09-30 LAB — COMPREHENSIVE METABOLIC PANEL
ALT: 12 U/L (ref 6–29)
AST: 20 U/L (ref 10–35)
Albumin: 4.2 g/dL (ref 3.6–5.1)
Alkaline Phosphatase: 72 U/L (ref 33–130)
BUN: 11 mg/dL (ref 7–25)
CO2: 25 mmol/L (ref 20–31)
Calcium: 9.5 mg/dL (ref 8.6–10.4)
Chloride: 102 mmol/L (ref 98–110)
Creat: 0.87 mg/dL (ref 0.60–0.88)
Glucose, Bld: 99 mg/dL (ref 65–99)
Potassium: 4.2 mmol/L (ref 3.5–5.3)
Sodium: 139 mmol/L (ref 135–146)
Total Bilirubin: 0.4 mg/dL (ref 0.2–1.2)
Total Protein: 7.3 g/dL (ref 6.1–8.1)

## 2015-09-30 LAB — LIPID PANEL
Cholesterol: 215 mg/dL — ABNORMAL HIGH (ref 125–200)
HDL: 61 mg/dL (ref >=46)
LDL CALC: 135 mg/dL — AB (ref <130)
Total CHOL/HDL Ratio: 3.5 Ratio (ref <=5.0)
Triglycerides: 95 mg/dL (ref <150)
VLDL: 19 mg/dL (ref <30)

## 2015-09-30 MED ORDER — AMLODIPINE BESYLATE 5 MG PO TABS: 5.0000 mg | ORAL_TABLET | Freq: Every day | ORAL | Status: DC
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
## 2015-10-01 LAB — VITAMIN D 25 HYDROXY (VIT D DEFICIENCY, FRACTURES): VIT D 25 HYDROXY: 25 ng/mL — AB (ref 30–100)

## 2015-10-26 ENCOUNTER — Encounter: Payer: Self-pay | Admitting: Podiatry

## 2015-10-26 ENCOUNTER — Ambulatory Visit (INDEPENDENT_AMBULATORY_CARE_PROVIDER_SITE_OTHER): Payer: Medicare Other | Admitting: Podiatry

## 2015-10-26 VITALS — BP 154/80 | HR 89 | Resp 12

## 2015-10-26 DIAGNOSIS — B351 Tinea unguium: Secondary | ICD-10-CM | POA: Diagnosis not present

## 2015-10-26 DIAGNOSIS — M79673 Pain in unspecified foot: Secondary | ICD-10-CM

## 2015-10-26 NOTE — Patient Instructions (Signed)
Today you have foot examination revealed adequate pulsations an adequate feeling in your feet. I trim the toenails on the right and left feet that are thick most likely from fungal infection and microtrauma. It will be okay if you want have pedicurist trim your nails. I recommend avoiding whirlpool and do not manipulate the cuticle's

## 2015-10-26 NOTE — Progress Notes (Signed)
   Subjective:    Patient ID: Shannon Jensen, female    DOB: June 14, 1928, 82 y.o.   MRN: [id number]  HPI    This patient presents today describing approximately a year history with a gradual increase in the thickness and color changes in her toenails and right and left feet. She describes visiting the pedicures to have the nails trimmed in the past, however, because of the texture and color changes she is requesting nail debridement and advised about follow-up care. She's had no specific treatment for the texture and color change in the toenails.  Review of Systems  Musculoskeletal: Positive for myalgias.       Objective:   Physical Exam  Pleasant orientated 3  Vascular: Mild peripheral nonpitting edema bilaterally DP and PT pulses 2/4 bilaterally Capillary reflex immediate bilaterally  Neurological: Sensation to 10 g monofilament wire intact 5/5 bilaterally Vibratory sensation nonreactive right reactive left Ankle reflex equal and reactive bilaterally  Dermatological: No open skin lesions bilaterally The toenails 6-10 are elongated with occasional texture and color changes. There is no surrounding erythema edema or active drainage from the nail plates  Musculoskeletal: HAV deformities bilaterally Hammertoe second bilaterally There is no restriction ankle, subtalar, midtarsal joints bilaterally      Assessment & Plan:   Assessment: Satisfactory neurovascular status Mycotic toenails 6-10  Plan: Today review results of the examination with patient today. I made aware that the nails did have texture and color changes most likely consistent with mycotic toenail infection. I recommended periodic debridement as needed of these toenails. I offered her debridement of these nails and she verbally consents  The toenails 6-10 were debrided mechanically an electrical without any bleeding  I made patient aware that she could have these nails debrided with pedicurist. I advised  her not to soak in the whirlpool or to manipulate the cuticles  Reappoint at patient's request

## 2015-11-06 NOTE — Assessment & Plan Note (Signed)
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 Mammogram December 2015 is normal 2. Breast exam 11/08/2014 is normal  I recommended Once a year follow-up in survivorship clinic.

## 2015-11-07 ENCOUNTER — Telehealth: Payer: Self-pay | Admitting: Hematology and Oncology

## 2015-11-07 ENCOUNTER — Other Ambulatory Visit (HOSPITAL_BASED_OUTPATIENT_CLINIC_OR_DEPARTMENT_OTHER): Payer: Medicare Other

## 2015-11-07 ENCOUNTER — Ambulatory Visit (HOSPITAL_BASED_OUTPATIENT_CLINIC_OR_DEPARTMENT_OTHER): Payer: Medicare Other | Admitting: Hematology and Oncology

## 2015-11-07 ENCOUNTER — Encounter: Payer: Self-pay | Admitting: Hematology and Oncology

## 2015-11-07 VITALS — BP 171/70 | HR 82 | Temp 98.1°F | Resp 18 | Ht 62.5 in | Wt 174.1 lb

## 2015-11-07 DIAGNOSIS — C50911 Malignant neoplasm of unspecified site of right female breast: Secondary | ICD-10-CM

## 2015-11-07 DIAGNOSIS — Z853 Personal history of malignant neoplasm of breast: Secondary | ICD-10-CM

## 2015-11-07 DIAGNOSIS — M810 Age-related osteoporosis without current pathological fracture: Secondary | ICD-10-CM

## 2015-11-07 LAB — CBC WITH DIFFERENTIAL/PLATELET
BASO%: 0.6 % (ref 0.0–2.0)
Basophils Absolute: 0 10*3/uL (ref 0.0–0.1)
EOS ABS: 0.2 10*3/uL (ref 0.0–0.5)
EOS%: 3 % (ref 0.0–7.0)
HEMATOCRIT: 40.8 % (ref 34.8–46.6)
HEMOGLOBIN: 13.3 g/dL (ref 11.6–15.9)
LYMPH#: 1.8 10*3/uL (ref 0.9–3.3)
LYMPH%: 24.1 % (ref 14.0–49.7)
MCH: 30.3 pg (ref 25.1–34.0)
MCHC: 32.7 g/dL (ref 31.5–36.0)
MCV: 92.8 fL (ref 79.5–101.0)
MONO#: 0.6 10*3/uL (ref 0.1–0.9)
MONO%: 8.8 % (ref 0.0–14.0)
NEUT%: 63.5 % (ref 38.4–76.8)
NEUTROS ABS: 4.7 10*3/uL (ref 1.5–6.5)
Platelets: 195 10*3/uL (ref 145–400)
RBC: 4.39 10*6/uL (ref 3.70–5.45)
RDW: 14.8 % — AB (ref 11.2–14.5)
WBC: 7.4 10*3/uL (ref 3.9–10.3)

## 2015-11-07 LAB — COMPREHENSIVE METABOLIC PANEL
ALBUMIN: 3.8 g/dL (ref 3.5–5.0)
ALK PHOS: 81 U/L (ref 40–150)
ALT: 12 U/L (ref 0–55)
AST: 20 U/L (ref 5–34)
Anion Gap: 10 mEq/L (ref 3–11)
BILIRUBIN TOTAL: 0.98 mg/dL (ref 0.20–1.20)
BUN: 9.6 mg/dL (ref 7.0–26.0)
CALCIUM: 9.7 mg/dL (ref 8.4–10.4)
CO2: 24 mEq/L (ref 22–29)
CREATININE: 0.9 mg/dL (ref 0.6–1.1)
Chloride: 105 mEq/L (ref 98–109)
EGFR: 67 mL/min/{1.73_m2} — ABNORMAL LOW (ref >90)
GLUCOSE: 85 mg/dL (ref 70–140)
Potassium: 3.9 mEq/L (ref 3.5–5.1)
SODIUM: 139 meq/L (ref 136–145)
TOTAL PROTEIN: 7.3 g/dL (ref 6.4–8.3)

## 2015-11-07 NOTE — Progress Notes (Signed)
Patient Care Team: [name], MD as PCP - General (Family Medicine)  SUMMARY OF ONCOLOGIC HISTORY:   History of breast cancer   05/24/2008 Surgery  Right breast lumpectomy revealing 0.9 cm grade 1 invasive ductal carcinoma with negative margins, ER positive, PR positive, HER-2 negative   06/29/2008 - 08/06/2008 Radiation Therapy  adjuvant radiation therapy   10/11/2008 - 10/08/2013 Anti-estrogen oral therapy  antiestrogen therapy with Femara then switched to tamoxifen due to osteoporosis completed 5 years of tamoxifen , patient declined extended adjuvant therapy.    CHIEF COMPLIANT: surveillance of right breast cancer  INTERVAL HISTORY: Shannon Jensen is a 82 year old with above-mentioned history of right breast cancer currently on surveillance and observation. She had been doing extremely well from health standpoint. She does not have any illnesses or concerns. She stays very busy with her [employer].  REVIEW OF SYSTEMS:   Constitutional: Denies fevers, chills or abnormal weight loss Eyes: Denies blurriness of vision Ears, nose, mouth, throat, and face: Denies mucositis or sore throat Respiratory: Denies cough, dyspnea or wheezes Cardiovascular: Denies palpitation, chest discomfort Gastrointestinal:  Denies nausea, heartburn or change in bowel habits Skin: Denies abnormal skin rashes Lymphatics: Denies new lymphadenopathy or easy bruising Neurological:Denies numbness, tingling or new weaknesses Behavioral/Psych: Mood is stable, no new changes  Extremities: No lower extremity edema Breast:  denies any pain or lumps or nodules in either breasts All other systems were reviewed with the patient and are negative.  I have reviewed the past medical history, past surgical history, social history and family history with the patient and they are unchanged from previous note.  ALLERGIES:  is allergic to celecoxib; erythromycin; iodine; nabumetone; nsaids; and  vioxx.  MEDICATIONS:  Current Outpatient Prescriptions  Medication Sig Dispense Refill  . acetaminophen (TYLENOL) 325 MG tablet Take 650 mg by mouth every 6 (six) hours as needed. Reported on 09/30/2015    . amLODipine (NORVASC) 5 MG tablet Take 1 tablet (5 mg total) by mouth daily. Office visit due now 90 tablet 3  . aspirin 81 MG tablet Take 81 mg by mouth daily.      . COD LIVER OIL PO Take 1 tablet by mouth daily.     . Multiple Vitamins-Minerals (RA HAIR/SKIN/NAILS) TABS Take 1 tablet by mouth daily.      . Multiple Vitamins-Minerals (WOMENS ONE DAILY) TABS Take 1 tablet by mouth daily.      . Omega-3 Fatty Acids (FISH OIL) 1200 MG CAPS Take 1 capsule by mouth daily.      Marland Kitchen ZOSTAVAX 64332 UNT/0.65ML injection inject contents of 1 vial subcutaneously  0   No current facility-administered medications for this visit.    PHYSICAL EXAMINATION: ECOG PERFORMANCE STATUS: 1 - Symptomatic but completely ambulatory  Filed Vitals:   11/07/15 1315  BP: 171/70  Pulse: 82  Temp: 98.1 F (36.7 C)  Resp: 18   Filed Weights   11/07/15 1315  Weight: 174 lb 1.6 oz (78.971 kg)    GENERAL:alert, no distress and comfortable SKIN: skin color, texture, turgor are normal, no rashes or significant lesions EYES: normal, Conjunctiva are pink and non-injected, sclera clear OROPHARYNX:no exudate, no erythema and lips, buccal mucosa, and tongue normal  NECK: supple, thyroid normal size, non-tender, without nodularity LYMPH:  no palpable lymphadenopathy in the cervical, axillary or inguinal LUNGS: clear to auscultation and percussion with normal breathing effort HEART: regular rate & rhythm and no murmurs and no lower extremity edema ABDOMEN:abdomen soft, non-tender and normal  bowel sounds MUSCULOSKELETAL:no cyanosis of digits and no clubbing  NEURO: alert & oriented x 3 with fluent speech, no focal motor/sensory deficits EXTREMITIES: No lower extremity edema  LABORATORY DATA:  I have reviewed the  data as listed   Chemistry      Component Value Date/Time   NA 139 09/30/2015 0001   NA 141 11/08/2014 1422   K 4.2 09/30/2015 0001   K 3.8 11/08/2014 1422   CL 102 09/30/2015 0001   CL 107 02/04/2013 1346   CO2 25 09/30/2015 0001   CO2 26 11/08/2014 1422   BUN 11 09/30/2015 0001   BUN 14.2 11/08/2014 1422   CREATININE 0.87 09/30/2015 0001   CREATININE 1.0 11/08/2014 1422   CREATININE 0.9 05/18/2014 1437      Component Value Date/Time   CALCIUM 9.5 09/30/2015 0001   CALCIUM 9.3 11/08/2014 1422   ALKPHOS 72 09/30/2015 0001   ALKPHOS 77 11/08/2014 1422   AST 20 09/30/2015 0001   AST 20 11/08/2014 1422   ALT 12 09/30/2015 0001   ALT 14 11/08/2014 1422   BILITOT 0.4 09/30/2015 0001   BILITOT 0.61 11/08/2014 1422       Lab Results  Component Value Date   WBC 7.4 11/07/2015   HGB 13.3 11/07/2015   HCT 40.8 11/07/2015   MCV 92.8 11/07/2015   PLT 195 11/07/2015   NEUTROABS 4.7 11/07/2015    ASSESSMENT & PLAN:  History of breast cancer 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 Mammogram 08/25/2015 is normal, breast density category B 2. Breast exam 11/08/2014 is normal 3. Bone density 08/25/2015: osteoporosis T score -3.1 bilateral femurs, and the spine -1.6 I instructed her to discuss a bone density results with her primary care physician to determine if she would need to go on any bisphosphonate therapy.  I recommended Once a year follow-up.   No orders of the defined types were placed in this encounter.   The patient has a good understanding of the overall plan. she agrees with it. she will call with any problems that may develop before the next visit  here.   Rulon Eisenmenger, MD 11/07/2015

## 2015-11-07 NOTE — Telephone Encounter (Signed)
Gave patient avs report and appointment for February 2018.

## 2016-08-15 ENCOUNTER — Other Ambulatory Visit (INDEPENDENT_AMBULATORY_CARE_PROVIDER_SITE_OTHER): Payer: Medicare Other

## 2016-08-15 DIAGNOSIS — Z23 Encounter for immunization: Secondary | ICD-10-CM | POA: Diagnosis not present

## 2016-08-27 ENCOUNTER — Ambulatory Visit (INDEPENDENT_AMBULATORY_CARE_PROVIDER_SITE_OTHER): Payer: Medicare Other | Admitting: Family Medicine

## 2016-08-27 ENCOUNTER — Other Ambulatory Visit: Payer: Self-pay | Admitting: Family Medicine

## 2016-08-27 ENCOUNTER — Encounter: Payer: Self-pay | Admitting: Family Medicine

## 2016-08-27 VITALS — BP 130/80 | HR 73 | Wt 178.0 lb

## 2016-08-27 DIAGNOSIS — M7711 Lateral epicondylitis, right elbow: Secondary | ICD-10-CM | POA: Diagnosis not present

## 2016-08-27 DIAGNOSIS — R252 Cramp and spasm: Secondary | ICD-10-CM | POA: Diagnosis not present

## 2016-08-27 DIAGNOSIS — I10 Essential (primary) hypertension: Secondary | ICD-10-CM | POA: Diagnosis not present

## 2016-08-27 DIAGNOSIS — K219 Gastro-esophageal reflux disease without esophagitis: Secondary | ICD-10-CM

## 2016-08-27 DIAGNOSIS — R0781 Pleurodynia: Secondary | ICD-10-CM

## 2016-08-27 DIAGNOSIS — Z1231 Encounter for screening mammogram for malignant neoplasm of breast: Secondary | ICD-10-CM

## 2016-08-27 LAB — CBC WITH DIFFERENTIAL/PLATELET
Basophils Absolute: 0 cells/uL (ref 0–200)
Basophils Relative: 0 %
Eosinophils Absolute: 152 cells/uL (ref 15–500)
Eosinophils Relative: 2 %
HCT: 41.9 % (ref 35.0–45.0)
HEMOGLOBIN: 13.8 g/dL (ref 11.7–15.5)
LYMPHS ABS: 2432 {cells}/uL (ref 850–3900)
Lymphocytes Relative: 32 %
MCH: 30.9 pg (ref 27.0–33.0)
MCHC: 32.9 g/dL (ref 32.0–36.0)
MCV: 93.7 fL (ref 80.0–100.0)
MONOS PCT: 8 %
MPV: 10 fL (ref 7.5–12.5)
Monocytes Absolute: 608 cells/uL (ref 200–950)
NEUTROS ABS: 4408 {cells}/uL (ref 1500–7800)
Neutrophils Relative %: 58 %
PLATELETS: 213 10*3/uL (ref 140–400)
RBC: 4.47 MIL/uL (ref 3.80–5.10)
RDW: 14.9 % (ref 11.0–15.0)
WBC: 7.6 10*3/uL (ref 4.0–10.5)

## 2016-08-27 LAB — COMPREHENSIVE METABOLIC PANEL
ALBUMIN: 4 g/dL (ref 3.6–5.1)
ALT: 13 U/L (ref 6–29)
AST: 20 U/L (ref 10–35)
Alkaline Phosphatase: 65 U/L (ref 33–130)
BILIRUBIN TOTAL: 0.7 mg/dL (ref 0.2–1.2)
BUN: 12 mg/dL (ref 7–25)
CHLORIDE: 105 mmol/L (ref 98–110)
CO2: 25 mmol/L (ref 20–31)
CREATININE: 0.83 mg/dL (ref 0.60–0.88)
Calcium: 9.5 mg/dL (ref 8.6–10.4)
Glucose, Bld: 89 mg/dL (ref 65–99)
Potassium: 4 mmol/L (ref 3.5–5.3)
Sodium: 138 mmol/L (ref 135–146)
TOTAL PROTEIN: 7 g/dL (ref 6.1–8.1)

## 2016-08-27 LAB — LIPID PANEL
CHOLESTEROL: 222 mg/dL — AB (ref <200)
HDL: 57 mg/dL (ref >50)
LDL CALC: 145 mg/dL — AB (ref <100)
TRIGLYCERIDES: 102 mg/dL (ref <150)
Total CHOL/HDL Ratio: 3.9 Ratio (ref <5.0)
VLDL: 20 mg/dL (ref <30)

## 2016-08-27 NOTE — Patient Instructions (Signed)
You can take 2 Tylenol 4 times per day as needed. If that doesn't work then try Aleve or Advil

## 2016-08-27 NOTE — Progress Notes (Signed)
   Subjective:    Patient ID: Shannon Jensen, female    DOB: [birth year], 82 y.o.   MRN: [id number]  HPI She is here for consultation concerning multiple complaints. She complains of slight increase in pain with motion but no shortness of breath or coughing. She has had difficulty with left rib pain but recently did see an orthopedic surgeon who x-rayed the ribs and found nothing. She also complains of left forearm pain that seems to bother her more when she uses her elbow especially with palms down. She also has a history of reflux symptoms and does note certain foods do make that worse. She also complains of intermittent numbness on her shins laterally that does tend to go away. She also has had some leg cramping again there is no association with anything in particular.   Review of Systems     Objective:   Physical Exam Alert and in no distress. Tympanic membranes and canals are normal. Pharyngeal area is normal. Neck is supple without adenopathy or thyromegaly. Cardiac exam shows a regular sinus rhythm without murmurs or gallops. Lungs are clear to auscultation. No chest wall tenderness is noted. Lower extremity exam shows no palpable lesions. Negative Homans sign. Pulses are normal. Reflexes good. She does show some tenderness palpation over the lateral epicondyle with increase in pain with gripping with the hands in a pronated position.  25 minutes, over 50% spent in counseling and coordination of care.        Assessment & Plan:  Lateral epicondylitis, right elbow  Gastroesophageal reflux disease without esophagitis - Plan: CBC with Differential/Platelet, Comprehensive metabolic panel, Lipid panel  Essential hypertension - Plan: CBC with Differential/Platelet, Comprehensive metabolic panel, Lipid panel  Leg cramps - Plan: CBC with Differential/Platelet, Comprehensive metabolic panel  Rib pain on left side Explained that the lateral epicondylitis can get better if she does his main  things that she can palms up and open. She will continue to treat her reflux with appropriate medications. Explained that the leg cramps and numbness are not associated with anything in particular but to use Tylenol for this and if no improvement, she can use Aleve.

## 2016-08-31 ENCOUNTER — Ambulatory Visit
Admission: RE | Admit: 2016-08-31 | Discharge: 2016-08-31 | Disposition: A | Discharge status: Home / Self Care | Payer: Medicare Other | Source: Ambulatory Visit | Attending: Family Medicine | Admitting: Family Medicine

## 2016-08-31 ENCOUNTER — Other Ambulatory Visit: Payer: Self-pay | Admitting: Family Medicine

## 2016-08-31 DIAGNOSIS — Z1231 Encounter for screening mammogram for malignant neoplasm of breast: Secondary | ICD-10-CM

## 2016-10-29 ENCOUNTER — Encounter: Payer: Self-pay | Admitting: Hematology and Oncology

## 2016-10-29 ENCOUNTER — Ambulatory Visit (HOSPITAL_BASED_OUTPATIENT_CLINIC_OR_DEPARTMENT_OTHER): Payer: Medicare Other | Admitting: Hematology and Oncology

## 2016-10-29 ENCOUNTER — Other Ambulatory Visit: Payer: Self-pay

## 2016-10-29 ENCOUNTER — Ambulatory Visit (HOSPITAL_BASED_OUTPATIENT_CLINIC_OR_DEPARTMENT_OTHER): Payer: Medicare Other

## 2016-10-29 VITALS — BP 166/66 | HR 90 | Temp 97.9°F | Resp 18 | Wt 176.5 lb

## 2016-10-29 DIAGNOSIS — R109 Unspecified abdominal pain: Secondary | ICD-10-CM | POA: Diagnosis not present

## 2016-10-29 DIAGNOSIS — Z170 Estrogen receptor positive status [ER+]: Principal | ICD-10-CM

## 2016-10-29 DIAGNOSIS — N390 Urinary tract infection, site not specified: Secondary | ICD-10-CM

## 2016-10-29 DIAGNOSIS — Z853 Personal history of malignant neoplasm of breast: Secondary | ICD-10-CM

## 2016-10-29 DIAGNOSIS — C50319 Malignant neoplasm of lower-inner quadrant of unspecified female breast: Secondary | ICD-10-CM

## 2016-10-29 LAB — URINALYSIS, MICROSCOPIC - CHCC
BILIRUBIN (URINE): NEGATIVE
Glucose: NEGATIVE mg/dL
Ketones: 15 mg/dL
NITRITE: NEGATIVE
PH: 7 (ref 4.6–8.0)
Protein: 30 mg/dL
SPECIFIC GRAVITY, URINE: 1.015 (ref 1.003–1.035)
Urobilinogen, UR: 0.2 mg/dL (ref 0.2–1)

## 2016-10-29 NOTE — Progress Notes (Signed)
Patient Care Team: [name], MD as PCP - General (Family Medicine)  DIAGNOSIS: No diagnosis found.  SUMMARY OF ONCOLOGIC HISTORY:   History of breast cancer   05/24/2008 Surgery     Right breast lumpectomy revealing 0.9 cm grade 1 invasive ductal carcinoma with negative margins, ER positive, PR positive, HER-2 negative      06/29/2008 - 08/06/2008 Radiation Therapy     adjuvant radiation therapy      10/11/2008 - 10/08/2013 Anti-estrogen oral therapy     antiestrogen therapy with Femara then switched to tamoxifen due to osteoporosis completed 5 years of tamoxifen , patient declined extended adjuvant therapy.       CHIEF COMPLIANT: Surveillance of breast cancer  INTERVAL HISTORY: Shannon Jensen is a 83 year old lady with above-mentioned history of right breast cancer treated with lumpectomy and radiation and is currently on surveillance. She is here for annual follow-up. She tells me that recently she had an upper GI bug and the this could be related to food poisoning. But she had stomach upset. She is otherwise doing fairly well. Denies any lumps or nodules in the breast.  REVIEW OF SYSTEMS:   Constitutional: Denies fevers, chills or abnormal weight loss Eyes: Denies blurriness of vision Ears, nose, mouth, throat, and face: Denies mucositis or sore throat Respiratory: Denies cough, dyspnea or wheezes Cardiovascular: Denies palpitation, chest discomfort Gastrointestinal:  Denies nausea, heartburn or change in bowel habits Skin: Denies abnormal skin rashes Lymphatics: Denies new lymphadenopathy or easy bruising Neurological:Denies numbness, tingling or new weaknesses Behavioral/Psych: Mood is stable, no new changes  Extremities: No lower extremity edema Breast: Tenderness in the right breast from prior surgery and radiation All other systems were reviewed with the patient and are negative.  I have reviewed the past medical history, past surgical history, social history  and family history with the patient and they are unchanged from previous note.  ALLERGIES:  is allergic to celecoxib; erythromycin; iodine; nabumetone; nsaids; and vioxx [rofecoxib].  MEDICATIONS:  Current Outpatient Prescriptions  Medication Sig Dispense Refill  . acetaminophen (TYLENOL) 325 MG tablet Take 650 mg by mouth every 6 (six) hours as needed. 81/20/2017    . amLODipine (NORVASC) 5 MG tablet Take 1 tablet (5 mg total) by mouth daily. Office visit due now 90 tablet 3  . aspirin 81 MG tablet Take 81 mg by mouth daily.      . cholecalciferol (VITAMIN D) 1000 units tablet Take 2,000 Units by mouth daily.    . COD LIVER OIL PO Take 1 tablet by mouth daily.     . Multiple Vitamins-Minerals (RA HAIR/SKIN/NAILS) TABS Take 1 tablet by mouth daily.      . Multiple Vitamins-Minerals (WOMENS ONE DAILY) TABS Take 1 tablet by mouth daily.      . Omega-3 Fatty Acids (FISH OIL) 1200 MG CAPS Take 1 capsule by mouth daily.       No current facility-administered medications for this visit.     PHYSICAL EXAMINATION: ECOG PERFORMANCE STATUS: 1 - Symptomatic but completely ambulatory  Vitals:   10/29/16 1313  BP: (!) 166/66  Pulse: 90  Resp: 18  Temp: 97.9 F (36.6 C)   Filed Weights   10/29/16 1313  Weight: 176 lb 8 oz (80.1 kg)    GENERAL:alert, no distress and comfortable SKIN: skin color, texture, turgor are normal, no rashes or significant lesions EYES: normal, Conjunctiva are pink and non-injected, sclera clear OROPHARYNX:no exudate, no erythema and lips, buccal mucosa, and  tongue normal  NECK: supple, thyroid normal size, non-tender, without nodularity LYMPH:  no palpable lymphadenopathy in the cervical, axillary or inguinal LUNGS: clear to auscultation and percussion with normal breathing effort HEART: regular rate & rhythm and no murmurs and no lower extremity edema ABDOMEN:abdomen soft, non-tender and normal bowel sounds MUSCULOSKELETAL:no cyanosis of digits and  no clubbing  NEURO: alert & oriented x 3 with fluent speech, no focal motor/sensory deficits EXTREMITIES: No lower extremity edema BREAST:Right breast lump palpable at the surgical scar tissue. (exam performed in the presence of a chaperone)  LABORATORY DATA:  I have reviewed the data as listed   Chemistry      Component Value Date/Time   NA 138 08/27/2016 1545   NA 139 11/07/2015 1259   K 4.0 08/27/2016 1545   K 3.9 11/07/2015 1259   CL 105 08/27/2016 1545   CL 107 02/04/2013 1346   CO2 25 08/27/2016 1545   CO2 24 11/07/2015 1259   BUN 12 08/27/2016 1545   BUN 9.6 11/07/2015 1259   CREATININE 0.83 08/27/2016 1545   CREATININE 0.9 11/07/2015 1259      Component Value Date/Time   CALCIUM 9.5 08/27/2016 1545   CALCIUM 9.7 11/07/2015 1259   ALKPHOS 65 08/27/2016 1545   ALKPHOS 81 11/07/2015 1259   AST 20 08/27/2016 1545   AST 20 11/07/2015 1259   ALT 13 08/27/2016 1545   ALT 12 11/07/2015 1259   BILITOT 0.7 08/27/2016 1545   BILITOT 0.98 11/07/2015 1259       Lab Results  Component Value Date   WBC 7.6 08/27/2016   HGB 13.8 08/27/2016   HCT 41.9 08/27/2016   MCV 93.7 08/27/2016   PLT 213 08/27/2016   NEUTROABS 4,408 08/27/2016    ASSESSMENT & PLAN:  History of breast cancer Right breast invasive ductal carcinoma T1b N0 M0 stage IA diagnosis in 2009 status post lumpectomy 05/24/2008 ER positive PR positive HER-2 negative status post adjuvant radiation and took 5 years of antiestrogen therapy with tamoxifen completed 10/08/2013 Patient was offered extended adjuvant therapy but she declined  Breast cancer surveillance: 1. Mammogram January 2018 is normal, breast density category B 2. Breast exam 10/29/2016 is normal 3. Bone density 08/25/2015: osteoporosis T score -3.1 bilateral femurs, and the spine -1.6 I instructed her to discuss a bone density results with her primary care physician to determine if she would need to go on any bisphosphonate therapy.  UTI  symptoms: UA did not reveal any evidence of infection  Patient had plenty of questions She also wanted to know what to take for stomach upset. She is experiencing abdominal discomfort since she had food poisoning.  I recommended Once a year follow-up.    I spent 25 minutes talking to the patient of which more than half was spent in counseling and coordination of care.  No orders of the defined types were placed in this encounter.  The patient has a good understanding of the overall plan. she agrees with it. she will call with any problems that may develop before the next visit here.   Rulon Eisenmenger, MD 10/29/16

## 2016-10-29 NOTE — Assessment & Plan Note (Signed)
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 Mammogram January 2018 is normal, breast density category B 2. Breast exam 10/29/2016 is normal 3. Bone density 08/25/2015: osteoporosis T score -3.1 bilateral femurs, and the spine -1.6 I instructed her to discuss a bone density results with her primary care physician to determine if she would need to go on any bisphosphonate therapy.  Patient plenty of questions about the mammogram that she is having. She also wanted to know what to take for stomach upset. She is experiencing abdominal discomfort since she had food poisoning.  I recommended Once a year follow-up.

## 2016-10-31 LAB — URINE CULTURE

## 2016-11-01 ENCOUNTER — Telehealth: Payer: Self-pay | Admitting: Hematology and Oncology

## 2016-11-01 ENCOUNTER — Other Ambulatory Visit: Payer: Self-pay

## 2016-11-01 ENCOUNTER — Telehealth: Payer: Self-pay

## 2016-11-01 MED ORDER — CIPROFLOXACIN HCL 500 MG PO TABS: 500.0000 mg | ORAL_TABLET | Freq: Two times a day (BID) | ORAL | 0 refills | Status: DC

## 2016-11-01 NOTE — Telephone Encounter (Signed)
Spoke with pt in the lobby today. All taken care of.

## 2016-11-01 NOTE — Progress Notes (Signed)
Attempted to call pt but reached vm only. Left call back number to have pt call back to discuss ua results. Sent in antibiotic to pharmacy.

## 2016-11-01 NOTE — Telephone Encounter (Signed)
Patient's daughter called and said " someone called her with her mom's lab work and they need to call her mom at [phone].

## 2016-11-01 NOTE — Telephone Encounter (Signed)
Attempted to call pt home phone and daughter x3 without success. LVM on both phone numbers with call back number. Calling pt to let her know that her urinalysis was positive for UTI and had sent prescription [facility].

## 2016-11-01 NOTE — Progress Notes (Signed)
Spoke with pt in person to discuss her urinalysis. Gave pt instructions on taking antibiotic for 1 week. Advised that pt increase fluid intake and may also add yogurt or probiotic in her diet to prevent diarrhea from antibiotic use. Pt verbalized understanding.

## 2016-11-27 ENCOUNTER — Other Ambulatory Visit: Payer: Self-pay | Admitting: Orthopedic Surgery

## 2016-11-27 DIAGNOSIS — R0781 Pleurodynia: Secondary | ICD-10-CM

## 2016-12-05 ENCOUNTER — Other Ambulatory Visit: Payer: Self-pay | Admitting: Family Medicine

## 2016-12-05 ENCOUNTER — Inpatient Hospital Stay
Admission: RE | Admit: 2016-12-05 | Discharge: 2016-12-05 | Disposition: A | Discharge status: Home / Self Care | Payer: Medicare Other | Source: Ambulatory Visit | Attending: Orthopedic Surgery | Admitting: Orthopedic Surgery

## 2016-12-05 ENCOUNTER — Other Ambulatory Visit: Payer: Self-pay | Admitting: Radiology

## 2016-12-05 DIAGNOSIS — I10 Essential (primary) hypertension: Secondary | ICD-10-CM

## 2016-12-05 DIAGNOSIS — T508X5D Adverse effect of diagnostic agents, subsequent encounter: Secondary | ICD-10-CM

## 2016-12-05 MED ORDER — PREDNISONE 50 MG PO TABS: ORAL_TABLET | ORAL | 0 refills | Status: DC

## 2016-12-05 MED ORDER — DIPHENHYDRAMINE HCL 25 MG PO CAPS: 50.0000 mg | ORAL_CAPSULE | Freq: Once | ORAL | 0 refills | Status: DC

## 2016-12-06 ENCOUNTER — Ambulatory Visit
Admission: RE | Admit: 2016-12-06 | Discharge: 2016-12-06 | Disposition: A | Discharge status: Home / Self Care | Payer: Medicare Other | Source: Ambulatory Visit | Attending: Orthopedic Surgery | Admitting: Orthopedic Surgery

## 2016-12-06 DIAGNOSIS — R0781 Pleurodynia: Secondary | ICD-10-CM

## 2016-12-06 MED ORDER — IOPAMIDOL (ISOVUE-300) INJECTION 61%
75.0000 mL | Freq: Once | INTRAVENOUS | Status: AC | PRN
  Administered 2016-12-06: 75 mL via INTRAVENOUS

## 2017-05-29 ENCOUNTER — Ambulatory Visit (INDEPENDENT_AMBULATORY_CARE_PROVIDER_SITE_OTHER): Payer: Medicare Other | Admitting: Family Medicine

## 2017-05-29 ENCOUNTER — Encounter: Payer: Self-pay | Admitting: Family Medicine

## 2017-05-29 VITALS — BP 130/80 | HR 82 | Temp 98.2°F | Resp 18 | Wt 172.8 lb

## 2017-05-29 DIAGNOSIS — J069 Acute upper respiratory infection, unspecified: Secondary | ICD-10-CM

## 2017-05-29 DIAGNOSIS — Z23 Encounter for immunization: Secondary | ICD-10-CM

## 2017-05-29 DIAGNOSIS — I10 Essential (primary) hypertension: Secondary | ICD-10-CM | POA: Diagnosis not present

## 2017-05-29 LAB — POCT URINALYSIS DIPSTICK
Bilirubin, UA: NEGATIVE
GLUCOSE UA: NEGATIVE
KETONES UA: NEGATIVE
NITRITE UA: NEGATIVE
PH UA: 6 (ref 5.0–8.0)
Spec Grav, UA: 1.025 (ref 1.010–1.025)
Urobilinogen, UA: NEGATIVE E.U./dL — AB

## 2017-05-29 MED ORDER — AMLODIPINE BESYLATE 5 MG PO TABS: ORAL_TABLET | ORAL | 3 refills | Status: DC

## 2017-05-29 NOTE — Progress Notes (Signed)
   Subjective:    Patient ID: Shannon Jensen, female    DOB: Sep 05, 1928, 83 y.o.   MRN: [id number]  HPI She complains of a three-day history of slight sore throat, headache, chest congestion, rhinorrhea, sneezing with slight cough but no fever, chills. She does not smoke and has no allergies. She does complain of some slight urinary discomfort. She also wants her immunizations updated. She continues on her blood pressure medications. Review of Systems     Objective:   Physical Exam Alert and in no distress. Tympanic membranes and canals are normal. Pharyngeal area is normal. Neck is supple without adenopathy or thyromegaly. Cardiac exam shows a regular sinus rhythm without murmurs or gallops. Lungs are clear to auscultation.       Assessment & Plan:  Need for influenza vaccination - Plan: Flu vaccine HIGH DOSE PF (Fluzone High dose)  Essential hypertension - Plan: amLODipine (NORVASC) 5 MG tablet, Urinalysis Dipstick  Upper respiratory tract infection, unspecified type I explained that her symptoms are viral in symptomatic care would be the appropriate way. She was comfortable with that. I will give her the flu shot today and recommend she get TDaP and Shingrix at the drugstore. Also of note is the fact her urine was normal.

## 2017-05-31 ENCOUNTER — Telehealth: Payer: Self-pay | Admitting: Family Medicine

## 2017-05-31 NOTE — Telephone Encounter (Signed)
Pt called to ask about urine results, states she has not received a call yet, Advised her per Dr. Lanice Shirts note urine was normal  Pt also requested to have copy of flu vaccine info sheet mailed to her  Mailed per pt request

## 2017-07-18 ENCOUNTER — Other Ambulatory Visit: Payer: Self-pay | Admitting: Hematology and Oncology

## 2017-07-18 DIAGNOSIS — Z1231 Encounter for screening mammogram for malignant neoplasm of breast: Secondary | ICD-10-CM

## 2017-09-04 ENCOUNTER — Ambulatory Visit: Payer: Medicare Other

## 2017-09-05 ENCOUNTER — Ambulatory Visit
Admission: RE | Admit: 2017-09-05 | Discharge: 2017-09-05 | Disposition: A | Discharge status: Home / Self Care | Payer: Medicare Other | Source: Ambulatory Visit | Attending: Hematology and Oncology | Admitting: Hematology and Oncology

## 2017-09-05 DIAGNOSIS — Z1231 Encounter for screening mammogram for malignant neoplasm of breast: Secondary | ICD-10-CM

## 2017-09-06 ENCOUNTER — Other Ambulatory Visit: Payer: Self-pay | Admitting: Hematology and Oncology

## 2017-09-06 DIAGNOSIS — R928 Other abnormal and inconclusive findings on diagnostic imaging of breast: Secondary | ICD-10-CM

## 2017-09-10 HISTORY — PX: BREAST LUMPECTOMY: SHX2

## 2017-09-12 ENCOUNTER — Ambulatory Visit
Admission: RE | Admit: 2017-09-12 | Discharge: 2017-09-12 | Disposition: A | Discharge status: Home / Self Care | Payer: Medicare Other | Source: Ambulatory Visit | Attending: Hematology and Oncology | Admitting: Hematology and Oncology

## 2017-09-12 ENCOUNTER — Other Ambulatory Visit: Payer: Self-pay | Admitting: Hematology and Oncology

## 2017-09-12 DIAGNOSIS — N6320 Unspecified lump in the left breast, unspecified quadrant: Secondary | ICD-10-CM

## 2017-09-12 DIAGNOSIS — R928 Other abnormal and inconclusive findings on diagnostic imaging of breast: Secondary | ICD-10-CM

## 2017-09-13 ENCOUNTER — Other Ambulatory Visit: Payer: Medicare Other

## 2017-09-18 ENCOUNTER — Ambulatory Visit
Admission: RE | Admit: 2017-09-18 | Discharge: 2017-09-18 | Disposition: A | Discharge status: Home / Self Care | Payer: Medicare Other | Source: Ambulatory Visit | Attending: Hematology and Oncology | Admitting: Hematology and Oncology

## 2017-09-18 ENCOUNTER — Other Ambulatory Visit: Payer: Self-pay | Admitting: Hematology and Oncology

## 2017-09-18 DIAGNOSIS — N6320 Unspecified lump in the left breast, unspecified quadrant: Secondary | ICD-10-CM

## 2017-09-19 ENCOUNTER — Ambulatory Visit
Admission: RE | Admit: 2017-09-19 | Discharge: 2017-09-19 | Disposition: A | Discharge status: Home / Self Care | Payer: Medicare Other | Source: Ambulatory Visit | Attending: Hematology and Oncology | Admitting: Hematology and Oncology

## 2017-09-19 DIAGNOSIS — N6320 Unspecified lump in the left breast, unspecified quadrant: Secondary | ICD-10-CM

## 2017-09-20 ENCOUNTER — Telehealth: Payer: Self-pay | Admitting: Hematology and Oncology

## 2017-09-20 NOTE — Telephone Encounter (Signed)
Scheduled appt per 1/10 sch msg - left voicemail for patient regarding appts.

## 2017-09-23 NOTE — Assessment & Plan Note (Signed)
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 Mammogram January 2019 is normal, breast density category B 2. Breast exam 09/24/17 is normal 3. Bone density 08/25/2015: osteoporosis T score -3.1 bilateral femurs, and the spine -1.6  Patient had plenty of questions She also wanted to know what to take for stomach upset. She is experiencing abdominal discomfort since she had food poisoning.  I recommended Once a year follow-up.

## 2017-09-24 ENCOUNTER — Ambulatory Visit: Payer: Medicare Other | Admitting: Hematology and Oncology

## 2017-09-24 ENCOUNTER — Inpatient Hospital Stay: Payer: Medicare Other | Attending: Hematology and Oncology | Admitting: Hematology and Oncology

## 2017-09-24 ENCOUNTER — Other Ambulatory Visit: Payer: Self-pay | Admitting: *Deleted

## 2017-09-24 DIAGNOSIS — C50212 Malignant neoplasm of upper-inner quadrant of left female breast: Secondary | ICD-10-CM | POA: Diagnosis not present

## 2017-09-24 DIAGNOSIS — Z853 Personal history of malignant neoplasm of breast: Secondary | ICD-10-CM | POA: Insufficient documentation

## 2017-09-24 DIAGNOSIS — Z170 Estrogen receptor positive status [ER+]: Secondary | ICD-10-CM

## 2017-09-24 NOTE — Progress Notes (Signed)
Patient Care Team: [name], MD as PCP - General (Family Medicine)  DIAGNOSIS:  Encounter Diagnoses  Name Primary?  . History of breast cancer   . 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       Malignant neoplasm of upper-inner quadrant of left female breast ([place])   09/18/2017 Initial Diagnosis    Left breast biopsy 11 o'clock position: IDC grade 2, 0.4 cm by mammogram.  The biopsy had 0.5 cm of material.  T1 a N0 stage I a       CHIEF COMPLIANT: Newly diagnosed left breast cancer  INTERVAL HISTORY: Shannon Jensen is a 84 year old with above-mentioned history of newly diagnosed left breast cancer.  Previously she had right breast cancer and was treated with lumpectomy radiation and hormone therapy that was completed in 2015.  She presented for a mammogram that detected abnormality in the right breast which on biopsy came back as invasive ductal carcinoma.  She is here today accompanied by her son and daughter to discuss her treatment plan.  REVIEW OF SYSTEMS:   Constitutional: Denies fevers, chills or abnormal weight loss Eyes: Denies blurriness of vision Ears, nose, mouth, throat, and face: Denies mucositis or sore throat Respiratory: Denies cough, dyspnea or wheezes Cardiovascular: Denies palpitation, chest discomfort Gastrointestinal:  Denies nausea, heartburn or change in bowel  habits Skin: Denies abnormal skin rashes Lymphatics: Denies new lymphadenopathy or easy bruising Neurological:Denies numbness, tingling or new weaknesses Behavioral/Psych: Mood is stable, no new changes  Extremities: No lower extremity edema Breast:  denies any pain or lumps or nodules in either breasts All other systems were reviewed with the patient and are negative.  I have reviewed the past medical history, past surgical history, social history and family history with the patient and they are unchanged from previous note.  ALLERGIES:  is allergic to celecoxib; erythromycin; iodine; nabumetone; nsaids; and vioxx [rofecoxib].  MEDICATIONS:  Current Outpatient Medications  Medication Sig Dispense Refill  . acetaminophen (TYLENOL) 325 MG tablet Take 650 mg by mouth every 6 (six) hours as needed. Reported on 09/30/2015    . amLODipine (NORVASC) 5 MG tablet TAKE 1 TABLET BY MOUTH DAILY. OFFICE VISIT DUE NOW 90 tablet 3  . aspirin 81 MG tablet Take 81 mg by mouth daily.      . cholecalciferol (VITAMIN D) 1000 units tablet Take 2,000 Units by mouth daily.    . diphenhydrAMINE (BENADRYL) 25 mg capsule Take 2 capsules (50 mg total) by mouth once. 2 capsule 0  . Multiple Vitamins-Minerals (RA HAIR/SKIN/NAILS) TABS Take 1 tablet by mouth daily.      . Multiple Vitamins-Minerals (WOMENS ONE DAILY) TABS Take 1 tablet by mouth daily.      . Omega-3 Fatty Acids (FISH OIL) 1200 MG CAPS Take 1 capsule by mouth daily.       No current facility-administered medications for this visit.  PHYSICAL EXAMINATION: ECOG PERFORMANCE STATUS: 0 - Asymptomatic  Vitals:   09/24/17 1129  BP: (!) 170/89  Pulse: 88  Resp: 20  Temp: 97.9 F (36.6 C)  SpO2: 100%   Filed Weights   09/24/17 1129  Weight: 170 lb (77.1 kg)    GENERAL:alert, no distress and comfortable SKIN: skin color, texture, turgor are normal, no rashes or significant lesions EYES: normal, Conjunctiva are pink and non-injected, sclera  clear OROPHARYNX:no exudate, no erythema and lips, buccal mucosa, and tongue normal  NECK: supple, thyroid normal size, non-tender, without nodularity LYMPH:  no palpable lymphadenopathy in the cervical, axillary or inguinal LUNGS: clear to auscultation and percussion with normal breathing effort HEART: regular rate & rhythm and no murmurs and no lower extremity edema ABDOMEN:abdomen soft, non-tender and normal bowel sounds MUSCULOSKELETAL:no cyanosis of digits and no clubbing  NEURO: alert & oriented x 3 with fluent speech, no focal motor/sensory deficits EXTREMITIES: No lower extremity edema BREAST: No palpable masses or nodules in either right or left breasts. No palpable axillary supraclavicular or infraclavicular adenopathy no breast tenderness or nipple discharge. (exam performed in the presence of a chaperone)  LABORATORY DATA:  I have reviewed the data as listed CMP Latest Ref Rng & Units 08/27/2016 11/07/2015 09/30/2015  Glucose 65 - 99 mg/dL 89 85 99  BUN 7 - 25 mg/dL 12 9.6 11  Creatinine 0.60 - 0.88 mg/dL 0.83 0.9 0.87  Sodium 135 - 146 mmol/L 138 139 139  Potassium 3.5 - 5.3 mmol/L 4.0 3.9 4.2  Chloride 98 - 110 mmol/L 105 - 102  CO2 20 - 31 mmol/L _0 Calcium 8.6 - 10.4 mg/dL 9.5 9.7 9.5  Total Protein 6.1 - 8.1 g/dL 7.0 7.3 7.3  Total Bilirubin 0.2 - 1.2 mg/dL 0.7 0.98 0.4  Alkaline Phos 33 - 130 U/L 65 81 72  AST 10 - 35 U/L _1 ALT 6 - 29 U/L _2 Lab Results  Component Value Date   WBC 7.6 08/27/2016   HGB 13.8 08/27/2016   HCT 41.9 08/27/2016   MCV 93.7 08/27/2016   PLT 213 08/27/2016   NEUTROABS 4,408 08/27/2016    ASSESSMENT & PLAN:  History of Right breast cancer Right breast invasive ductal carcinoma T1b N0 M0 stage IA diagnosis in 2009 status post lumpectomy 05/24/2008 ER positive PR positive HER-2 negative status post adjuvant radiation and took 5 years of antiestrogen therapy with tamoxifen completed 10/08/2013 Patient was offered  extended adjuvant therapy but she declined    Malignant neoplasm of upper-inner quadrant of left female breast ([place]) 09/18/2017: Left breast biopsy 11 o'clock position: IDC grade 2, 0.4 cm by mammogram.  The biopsy had 0.5 cm of material.  T1 a N0 stage I a  Pathology and radiology counseling:Discussed with the patient, the details of pathology including the type of breast cancer,the clinical staging, the significance of ER, PR and HER-2/neu receptors and the implications for treatment. After reviewing the pathology in detail, we proceeded to discuss the different treatment options between surgery, radiation, and antiestrogen therapies.  Recommendations: 1. Breast conserving surgery followed by 2. Adjuvant radiation therapy followed by 3. Adjuvant antiestrogen therapy  We will arrange follow-ups with Dr. Donne Hazel with surgery and Dr. Tammi Klippel with radiation oncology to discuss the pros and cons of surgery and radiation therapy.  I will call the patient with the results of the estrogen and progesterone receptor and HER-2 receptor status.  I spent 25 minutes  talking to the patient of which more than half was spent in counseling and coordination of care.  No orders of the defined types were placed in this encounter.  The patient has a good understanding of the overall plan. she agrees with it. she will call with any problems that may develop before the next visit here.   Harriette Ohara, MD 09/24/17

## 2017-09-24 NOTE — Assessment & Plan Note (Signed)
09/18/2017: Left breast biopsy 11 o'clock position: IDC grade 2, 0.4 cm by mammogram.  The biopsy had 0.5 cm of material.  T1 a N0 stage I a  Pathology and radiology counseling:Discussed with the patient, the details of pathology including the type of breast cancer,the clinical staging, the significance of ER, PR and HER-2/neu receptors and the implications for treatment. After reviewing the pathology in detail, we proceeded to discuss the different treatment options between surgery, radiation, and antiestrogen therapies.  Recommendations: 1. Breast conserving surgery followed by 2. Adjuvant radiation therapy followed by 3. Adjuvant antiestrogen therapy  We will arrange follow-ups with Dr. Donne Hazel with surgery and Dr. Tammi Klippel with radiation oncology to discuss the pros and cons of surgery and radiation therapy.  I will call the patient with the results of the estrogen and progesterone receptor and HER-2 receptor status.

## 2017-09-25 ENCOUNTER — Telehealth: Payer: Self-pay | Admitting: Hematology and Oncology

## 2017-09-25 NOTE — Telephone Encounter (Signed)
No 11/5 los.  °

## 2017-09-26 ENCOUNTER — Ambulatory Visit: Payer: Medicare Other | Admitting: Radiation Oncology

## 2017-09-26 ENCOUNTER — Ambulatory Visit: Payer: Medicare Other

## 2017-09-26 ENCOUNTER — Ambulatory Visit
Admission: RE | Admit: 2017-09-26 | Discharge: 2017-09-26 | Disposition: A | Discharge status: Home / Self Care | Payer: Medicare Other | Source: Ambulatory Visit | Attending: Radiation Oncology | Admitting: Radiation Oncology

## 2017-09-30 ENCOUNTER — Telehealth: Payer: Self-pay

## 2017-09-30 NOTE — Telephone Encounter (Signed)
TC to patient regarding her VM, she wanted her HER2 results.  Per notes from last visit, Dr Lindi Adie will call her with results.  Explained to pt results could take 2-3 weeks. Pt voiced understanding and said she wanted a recommendation from Dr Lindi Adie for a breast cancer radiologist.  Per Dr Lindi Adie, he has spoke with Dr Lisbeth Renshaw and I let pt know that the nurse navigator will contact her with an appt with Dr Lisbeth Renshaw. Will In-basket a message to Marcine Matar, nurse navigator. No other needs per pt at this time.

## 2017-10-02 ENCOUNTER — Encounter: Payer: Self-pay | Admitting: Family Medicine

## 2017-10-03 LAB — HM MAMMOGRAPHY

## 2017-10-08 ENCOUNTER — Other Ambulatory Visit: Payer: Self-pay | Admitting: General Surgery

## 2017-10-08 ENCOUNTER — Encounter: Payer: Self-pay | Admitting: Family Medicine

## 2017-10-08 DIAGNOSIS — N6310 Unspecified lump in the right breast, unspecified quadrant: Secondary | ICD-10-CM

## 2017-10-09 ENCOUNTER — Ambulatory Visit: Payer: Medicare Other

## 2017-10-09 ENCOUNTER — Ambulatory Visit: Payer: Medicare Other | Admitting: Radiation Oncology

## 2017-10-09 ENCOUNTER — Encounter: Payer: Self-pay | Admitting: Family Medicine

## 2017-10-09 DIAGNOSIS — C50912 Malignant neoplasm of unspecified site of left female breast: Secondary | ICD-10-CM | POA: Insufficient documentation

## 2017-10-15 ENCOUNTER — Ambulatory Visit
Admission: RE | Admit: 2017-10-15 | Discharge: 2017-10-15 | Disposition: A | Discharge status: Home / Self Care | Payer: Medicare Other | Source: Ambulatory Visit | Attending: General Surgery | Admitting: General Surgery

## 2017-10-15 DIAGNOSIS — N6310 Unspecified lump in the right breast, unspecified quadrant: Secondary | ICD-10-CM

## 2017-10-21 ENCOUNTER — Telehealth: Payer: Self-pay

## 2017-10-21 NOTE — Telephone Encounter (Signed)
Returned call and verified appointment with Dr. Lindi Adie on 10/29/17 @ 145p.  Cyndia Bent RN

## 2017-10-29 ENCOUNTER — Inpatient Hospital Stay: Payer: Medicare Other | Attending: Hematology and Oncology | Admitting: Hematology and Oncology

## 2017-10-29 DIAGNOSIS — Z170 Estrogen receptor positive status [ER+]: Secondary | ICD-10-CM | POA: Diagnosis not present

## 2017-10-29 DIAGNOSIS — Z853 Personal history of malignant neoplasm of breast: Secondary | ICD-10-CM | POA: Insufficient documentation

## 2017-10-29 DIAGNOSIS — C50212 Malignant neoplasm of upper-inner quadrant of left female breast: Secondary | ICD-10-CM

## 2017-10-29 NOTE — Assessment & Plan Note (Signed)
09/18/2017: Left breast biopsy 11 o'clock position: IDC grade 2, 0.4 cm by mammogram.  The biopsy had 0.5 cm of material. ER 100%, PR 70%, Ki 67: 2%, Her 2 Neg Ratio: 1.33 T1 a N0 stage I a  Recommendations: 1. Breast conserving surgery followed by 2. Adjuvant radiation therapy followed by 3. Adjuvant antiestrogen therapy

## 2017-10-29 NOTE — Assessment & Plan Note (Signed)
Right breast invasive ductal carcinoma T1b N0 M0 stage IA diagnosis in 2009 status post lumpectomy 05/24/2008 ER positive PR positive HER-2 negative status post adjuvant radiation and took 5 years of antiestrogen therapy with tamoxifen completed 10/08/2013 Patient was offered extended adjuvant therapy but she declined

## 2017-10-29 NOTE — Progress Notes (Signed)
Patient Care Team: [name], MD as PCP - General (Family Medicine)  DIAGNOSIS:  Encounter Diagnoses  Name Primary?  . Malignant neoplasm of upper-inner quadrant of left breast in female, estrogen receptor positive ([name])   . History of right breast cancer     SUMMARY OF ONCOLOGIC HISTORY:   History of right breast cancer   05/24/2008 Surgery     Right breast lumpectomy revealing 0.9 cm grade 1 invasive ductal carcinoma with negative margins, ER positive, PR positive, HER-2 negative      06/29/2008 - 08/06/2008 Radiation Therapy     adjuvant radiation therapy      10/11/2008 - 10/08/2013 Anti-estrogen oral therapy     antiestrogen therapy with Femara then switched to tamoxifen due to osteoporosis completed 5 years of tamoxifen , patient declined extended adjuvant therapy.       Malignant neoplasm of upper-inner quadrant of left breast in female, estrogen receptor positive ([name])   09/18/2017 Initial Diagnosis    Left breast biopsy 11 o'clock position: IDC grade 2, 0.4 cm by mammogram.  The biopsy had 0.5 cm of material.  T1 a N0 stage I a       CHIEF COMPLIANT: Follow-up to discuss the treatment plan for left breast cancer  INTERVAL HISTORY: Shannon Jensen is a 84 year old with above-mentioned history of right breast cancer now diagnosed with a new left breast cancer.  She is here today to ask several questions regarding her treatment plan.  She is accompanied by her family today.  She had a recent ultrasound and mammogram of the right breast which did not show any evidence of malignancy.  She had a seroma along the scar tissue.  REVIEW OF SYSTEMS:   Constitutional: Denies fevers, chills or abnormal weight loss Eyes: Denies blurriness of vision Ears, nose, mouth, throat, and face: Denies mucositis or sore throat Respiratory: Denies cough, dyspnea or wheezes Cardiovascular: Denies palpitation, chest discomfort Gastrointestinal:  Denies nausea, heartburn or change in  bowel habits Skin: Denies abnormal skin rashes Lymphatics: Denies new lymphadenopathy or easy bruising Neurological:Denies numbness, tingling or new weaknesses Behavioral/Psych: Mood is stable, no new changes  Extremities: No lower extremity edema  All other systems were reviewed with the patient and are negative.  I have reviewed the past medical history, past surgical history, social history and family history with the patient and they are unchanged from previous note.  ALLERGIES:  is allergic to celecoxib; erythromycin; iodine; nabumetone; nsaids; and vioxx [rofecoxib].  MEDICATIONS:  Current Outpatient Medications  Medication Sig Dispense Refill  . acetaminophen (TYLENOL) 325 MG tablet Take 650 mg by mouth every 6 (six) hours as needed. Reported on 09/30/2015    . amLODipine (NORVASC) 5 MG tablet TAKE 1 TABLET BY MOUTH DAILY. OFFICE VISIT DUE NOW 90 tablet 3  . aspirin 81 MG tablet Take 81 mg by mouth daily.      . cholecalciferol (VITAMIN D) 1000 units tablet Take 2,000 Units by mouth daily.    . diphenhydrAMINE (BENADRYL) 25 mg capsule Take 2 capsules (50 mg total) by mouth once. 2 capsule 0  . Multiple Vitamins-Minerals (RA HAIR/SKIN/NAILS) TABS Take 1 tablet by mouth daily.      . Multiple Vitamins-Minerals (WOMENS ONE DAILY) TABS Take 1 tablet by mouth daily.      . Omega-3 Fatty Acids (FISH OIL) 1200 MG CAPS Take 1 capsule by mouth daily.       No current facility-administered medications for this visit.     PHYSICAL  EXAMINATION: ECOG PERFORMANCE STATUS: 1 - Symptomatic but completely ambulatory  Vitals:   10/29/17 1354  BP: (!) 155/79  Pulse: 92  Resp: 17  Temp: 98.4 F (36.9 C)  SpO2: 100%   Filed Weights   10/29/17 1354  Weight: 176 lb 1.6 oz (79.9 kg)    GENERAL:alert, no distress and comfortable SKIN: skin color, texture, turgor are normal, no rashes or significant lesions EYES: normal, Conjunctiva are pink and non-injected, sclera clear OROPHARYNX:no  exudate, no erythema and lips, buccal mucosa, and tongue normal  NECK: supple, thyroid normal size, non-tender, without nodularity LYMPH:  no palpable lymphadenopathy in the cervical, axillary or inguinal LUNGS: clear to auscultation and percussion with normal breathing effort HEART: regular rate & rhythm and no murmurs and no lower extremity edema ABDOMEN:abdomen soft, non-tender and normal bowel sounds MUSCULOSKELETAL:no cyanosis of digits and no clubbing  NEURO: alert & oriented x 3 with fluent speech, no focal motor/sensory deficits EXTREMITIES: No lower extremity edema   LABORATORY DATA:  I have reviewed the data as listed CMP Latest Ref Rng & Units 08/27/2016 11/07/2015 09/30/2015  Glucose 65 - 99 mg/dL 89 85 99  BUN 7 - 25 mg/dL 12 9.6 11  Creatinine 0.60 - 0.88 mg/dL 0.83 0.9 0.87  Sodium 135 - 146 mmol/L 138 139 139  Potassium 3.5 - 5.3 mmol/L 4.0 3.9 4.2  Chloride 98 - 110 mmol/L 105 - 102  CO2 20 - 31 mmol/L '25 24 25  ' Calcium 8.6 - 10.4 mg/dL 9.5 9.7 9.5  Total Protein 6.1 - 8.1 g/dL 7.0 7.3 7.3  Total Bilirubin 0.2 - 1.2 mg/dL 0.7 0.98 0.4  Alkaline Phos 33 - 130 U/L 65 81 72  AST 10 - 35 U/L '20 20 20  ' ALT 6 - 29 U/L '13 12 12    ' Lab Results  Component Value Date   WBC 7.6 08/27/2016   HGB 13.8 08/27/2016   HCT 41.9 08/27/2016   MCV 93.7 08/27/2016   PLT 213 08/27/2016   NEUTROABS 4,408 08/27/2016    ASSESSMENT & PLAN:  Malignant neoplasm of upper-inner quadrant of left breast in female, estrogen receptor positive ([name]) 09/18/2017: Left breast biopsy 11 o'clock position: IDC grade 2, 0.4 cm by mammogram.  The biopsy had 0.5 cm of material. ER 100%, PR 70%, Ki 67: 2%, Her 2 Neg Ratio: 1.33 T1 a N0 stage I a  Recommendations: 1. Breast conserving surgery followed by 2. patient is not keen on doing radiation.  She would like to discuss this with me after her surgery. 3. Adjuvant antiestrogen therapy   History of right breast cancer Right breast invasive ductal  carcinoma T1b N0 M0 stage IA diagnosis in 2009 status post lumpectomy 05/24/2008 ER positive PR positive HER-2 negative status post adjuvant radiation and took 5 years of antiestrogen therapy with tamoxifen completed 10/08/2013 Patient was offered extended adjuvant therapy but she declined   Return to clinic after surgery to discuss the pathology report and to finalize adjuvant treatment plan.  I spent 25 minutes talking to the patient of which more than half was spent in counseling and coordination of care.  No orders of the defined types were placed in this encounter.  The patient has a good understanding of the overall plan. she agrees with it. she will call with any problems that may develop before the next visit here.   Harriette Ohara, MD 10/29/17

## 2017-11-01 ENCOUNTER — Other Ambulatory Visit: Payer: Self-pay | Admitting: General Surgery

## 2017-11-01 DIAGNOSIS — C50212 Malignant neoplasm of upper-inner quadrant of left female breast: Secondary | ICD-10-CM

## 2017-11-06 ENCOUNTER — Other Ambulatory Visit: Payer: Self-pay | Admitting: General Surgery

## 2017-11-06 DIAGNOSIS — C50212 Malignant neoplasm of upper-inner quadrant of left female breast: Secondary | ICD-10-CM

## 2017-11-08 ENCOUNTER — Telehealth: Payer: Self-pay | Admitting: Hematology and Oncology

## 2017-11-08 NOTE — Telephone Encounter (Signed)
Mailed patient calendar of upcoming march appointments per 3/1 sch message.

## 2017-11-08 NOTE — Pre-Procedure Instructions (Signed)
Shannon Jensen  11/08/2017      [id number] [place] [place] [address] Phone: [phone] Fax: [phone]    Your procedure is scheduled on November 19, 2017.  Report to [facility] Admitting at 930 AM.  Call this number if you have problems the morning of surgery:  [phone]   Remember:  Do not eat food or drink liquids after midnight.  Take these medicines the morning of surgery with A SIP OF WATER amlodipine (norvasc), eye drops-if needed, tylenol-if needed.  7 days prior to surgery STOP taking any Aspirin (unless otherwise instructed by your surgeon), Aleve, Naproxen, Ibuprofen, Motrin, Advil, Goody's, BC's, all herbal medications, fish oil, and all vitamins  Continue all other medications as instructed by your physician except follow the above medication instructions before surgery   Do not wear jewelry, make-up or nail polish.  Do not wear lotions, powders, or perfumes, or deodorant.  Do not shave 48 hours prior to surgery.    Do not bring valuables to the hospital.  [facility] is not responsible for any belongings or valuables.  Contacts, dentures or bridgework may not be worn into surgery.  Leave your suitcase in the car.  After surgery it may be brought to your room.  For patients admitted to the hospital, discharge time will be determined by your treatment team.  Patients discharged the day of surgery will not be allowed to drive home.   Special instructions: - Preparing For Surgery  Before surgery, you can play an important role. Because skin is not sterile, your skin needs to be as free of germs as possible. You can reduce the number of germs on your skin by washing with CHG (chlorahexidine gluconate) Soap before surgery.  CHG is an antiseptic cleaner which kills germs and bonds with the skin to continue killing germs even after washing.  Please do not use if you have an  allergy to CHG or antibacterial soaps. If your skin becomes reddened/irritated stop using the CHG.  Do not shave (including legs and underarms) for at least 48 hours prior to first CHG shower. It is OK to shave your face.  Please follow these instructions carefully.   1. Shower the NIGHT BEFORE SURGERY and the MORNING OF SURGERY with CHG.   2. If you chose to wash your hair, wash your hair first as usual with your normal shampoo.  3. After you shampoo, rinse your hair and body thoroughly to remove the shampoo.  4. Use CHG as you would any other liquid soap. You can apply CHG directly to the skin and wash gently with a scrungie or a clean washcloth.   5. Apply the CHG Soap to your body ONLY FROM THE NECK DOWN.  Do not use on open wounds or open sores. Avoid contact with your eyes, ears, mouth and genitals (private parts). Wash Face and genitals (private parts)  with your normal soap.  6. Wash thoroughly, paying special attention to the area where your surgery will be performed.  7. Thoroughly rinse your body with warm water from the neck down.  8. DO NOT shower/wash with your normal soap after using and rinsing off the CHG Soap.  9. Pat yourself dry with a CLEAN TOWEL.  10. Wear CLEAN PAJAMAS to bed the night before surgery, wear comfortable clothes the morning of surgery  11. Place CLEAN SHEETS on your bed the night of your  first shower and DO NOT SLEEP WITH PETS.  Day of Surgery: Do not apply any deodorants/lotions. Please wear clean clothes to the hospital/surgery center.    Please read over the following fact sheets that you were given. Pain Booklet, Coughing and Deep Breathing and Surgical Site Infection Prevention

## 2017-11-08 NOTE — Progress Notes (Addendum)
PCP: Jill Alexanders, MD  Cardiologist:  EKG:  Stress test:  ECHO:  Cardiac Cath:  Chest x-ray

## 2017-11-12 ENCOUNTER — Other Ambulatory Visit: Payer: Self-pay

## 2017-11-12 ENCOUNTER — Encounter (HOSPITAL_COMMUNITY)
Admission: RE | Admit: 2017-11-12 | Discharge: 2017-11-12 | Disposition: A | Discharge status: Home / Self Care | Payer: Medicare Other | Source: Ambulatory Visit | Attending: General Surgery | Admitting: General Surgery

## 2017-11-12 ENCOUNTER — Encounter (HOSPITAL_COMMUNITY): Payer: Self-pay

## 2017-11-12 DIAGNOSIS — Z853 Personal history of malignant neoplasm of breast: Secondary | ICD-10-CM | POA: Insufficient documentation

## 2017-11-12 DIAGNOSIS — Z01810 Encounter for preprocedural cardiovascular examination: Secondary | ICD-10-CM | POA: Diagnosis not present

## 2017-11-12 DIAGNOSIS — F419 Anxiety disorder, unspecified: Secondary | ICD-10-CM | POA: Diagnosis not present

## 2017-11-12 DIAGNOSIS — F329 Major depressive disorder, single episode, unspecified: Secondary | ICD-10-CM | POA: Insufficient documentation

## 2017-11-12 DIAGNOSIS — Z01812 Encounter for preprocedural laboratory examination: Secondary | ICD-10-CM | POA: Diagnosis not present

## 2017-11-12 LAB — CBC
HCT: 40.8 % (ref 36.0–46.0)
Hemoglobin: 13.5 g/dL (ref 12.0–15.0)
MCH: 30.5 pg (ref 26.0–34.0)
MCHC: 33.1 g/dL (ref 30.0–36.0)
MCV: 92.1 fL (ref 78.0–100.0)
PLATELETS: 215 10*3/uL (ref 150–400)
RBC: 4.43 MIL/uL (ref 3.87–5.11)
RDW: 14.4 % (ref 11.5–15.5)
WBC: 8.2 10*3/uL (ref 4.0–10.5)

## 2017-11-12 LAB — BASIC METABOLIC PANEL
Anion gap: 9 (ref 5–15)
BUN: 13 mg/dL (ref 6–20)
CALCIUM: 10.2 mg/dL (ref 8.9–10.3)
CO2: 26 mmol/L (ref 22–32)
CREATININE: 0.88 mg/dL (ref 0.44–1.00)
Chloride: 104 mmol/L (ref 101–111)
GFR calc Af Amer: >60 mL/min (ref >60)
GFR, EST NON AFRICAN AMERICAN: 57 mL/min — AB (ref >60)
Glucose, Bld: 96 mg/dL (ref 65–99)
POTASSIUM: 4 mmol/L (ref 3.5–5.1)
SODIUM: 139 mmol/L (ref 135–145)

## 2017-11-12 NOTE — Progress Notes (Addendum)
PCP is Dr. Jill Alexanders  LOV 05/2017 Oncologist Dr. Lindi Adie  LOV 10/2017      Only received radiation back in 2009 with right breast cancer.  Denies any murmur, cp, sob.  Saw cardio "many years ago"

## 2017-11-18 ENCOUNTER — Ambulatory Visit
Admission: RE | Admit: 2017-11-18 | Discharge: 2017-11-18 | Disposition: A | Discharge status: Home / Self Care | Payer: Medicare Other | Source: Ambulatory Visit | Attending: General Surgery | Admitting: General Surgery

## 2017-11-18 DIAGNOSIS — C50212 Malignant neoplasm of upper-inner quadrant of left female breast: Secondary | ICD-10-CM

## 2017-11-19 ENCOUNTER — Other Ambulatory Visit: Payer: Self-pay

## 2017-11-19 ENCOUNTER — Encounter (HOSPITAL_COMMUNITY): Payer: Self-pay | Admitting: *Deleted

## 2017-11-19 ENCOUNTER — Ambulatory Visit (HOSPITAL_COMMUNITY)
Admission: RE | Admit: 2017-11-19 | Discharge: 2017-11-19 | Disposition: A | Discharge status: Home / Self Care | Payer: Medicare Other | Source: Ambulatory Visit | Attending: General Surgery | Admitting: General Surgery

## 2017-11-19 ENCOUNTER — Ambulatory Visit (HOSPITAL_COMMUNITY): Payer: Medicare Other | Admitting: Anesthesiology

## 2017-11-19 ENCOUNTER — Ambulatory Visit
Admission: RE | Admit: 2017-11-19 | Discharge: 2017-11-19 | Disposition: A | Discharge status: Home / Self Care | Payer: Medicare Other | Source: Ambulatory Visit | Attending: General Surgery | Admitting: General Surgery

## 2017-11-19 ENCOUNTER — Encounter (HOSPITAL_COMMUNITY)
Admission: RE | Disposition: A | Discharge status: Home / Self Care | Payer: Self-pay | Source: Ambulatory Visit | Attending: General Surgery

## 2017-11-19 DIAGNOSIS — Z888 Allergy status to other drugs, medicaments and biological substances status: Secondary | ICD-10-CM | POA: Insufficient documentation

## 2017-11-19 DIAGNOSIS — Z881 Allergy status to other antibiotic agents status: Secondary | ICD-10-CM | POA: Insufficient documentation

## 2017-11-19 DIAGNOSIS — K219 Gastro-esophageal reflux disease without esophagitis: Secondary | ICD-10-CM | POA: Diagnosis not present

## 2017-11-19 DIAGNOSIS — Z79899 Other long term (current) drug therapy: Secondary | ICD-10-CM | POA: Insufficient documentation

## 2017-11-19 DIAGNOSIS — Z170 Estrogen receptor positive status [ER+]: Secondary | ICD-10-CM | POA: Insufficient documentation

## 2017-11-19 DIAGNOSIS — C50212 Malignant neoplasm of upper-inner quadrant of left female breast: Secondary | ICD-10-CM

## 2017-11-19 DIAGNOSIS — Z886 Allergy status to analgesic agent status: Secondary | ICD-10-CM | POA: Diagnosis not present

## 2017-11-19 DIAGNOSIS — Z7982 Long term (current) use of aspirin: Secondary | ICD-10-CM | POA: Diagnosis not present

## 2017-11-19 DIAGNOSIS — I10 Essential (primary) hypertension: Secondary | ICD-10-CM | POA: Diagnosis not present

## 2017-11-19 HISTORY — PX: BREAST LUMPECTOMY WITH RADIOACTIVE SEED LOCALIZATION: SHX6424

## 2017-11-19 SURGERY — BREAST LUMPECTOMY WITH RADIOACTIVE SEED LOCALIZATION
Anesthesia: General | Site: Breast | Laterality: Left

## 2017-11-19 MED ORDER — PROPOFOL 10 MG/ML IV BOLUS
INTRAVENOUS | Status: AC
  Filled 2017-11-19: qty 20

## 2017-11-19 MED ORDER — GABAPENTIN 300 MG PO CAPS
ORAL_CAPSULE | ORAL | Status: AC
  Administered 2017-11-19: 300 mg
  Filled 2017-11-19: qty 1

## 2017-11-19 MED ORDER — CEFAZOLIN SODIUM-DEXTROSE 2-4 GM/100ML-% IV SOLN
INTRAVENOUS | Status: AC
  Filled 2017-11-19: qty 100

## 2017-11-19 MED ORDER — LACTATED RINGERS IV SOLN
INTRAVENOUS | Status: DC
  Administered 2017-11-19 (×2): via INTRAVENOUS

## 2017-11-19 MED ORDER — FENTANYL CITRATE (PF) 100 MCG/2ML IJ SOLN
INTRAMUSCULAR | Status: DC | PRN
  Administered 2017-11-19: 100 ug via INTRAVENOUS

## 2017-11-19 MED ORDER — CEFAZOLIN SODIUM-DEXTROSE 2-4 GM/100ML-% IV SOLN: 2.0000 g | INTRAVENOUS | Status: DC

## 2017-11-19 MED ORDER — ACETAMINOPHEN 500 MG PO TABS
ORAL_TABLET | ORAL | Status: AC
  Administered 2017-11-19: 1000 mg
  Filled 2017-11-19: qty 2

## 2017-11-19 MED ORDER — ACETAMINOPHEN 500 MG PO TABS: 1000.0000 mg | ORAL_TABLET | ORAL | Status: DC

## 2017-11-19 MED ORDER — GABAPENTIN 300 MG PO CAPS: 300.0000 mg | ORAL_CAPSULE | ORAL | Status: DC

## 2017-11-19 MED ORDER — FENTANYL CITRATE (PF) 250 MCG/5ML IJ SOLN
INTRAMUSCULAR | Status: AC
  Filled 2017-11-19: qty 5

## 2017-11-19 MED ORDER — BUPIVACAINE-EPINEPHRINE (PF) 0.5% -1:200000 IJ SOLN
INTRAMUSCULAR | Status: AC
  Filled 2017-11-19: qty 30

## 2017-11-19 MED ORDER — PROPOFOL 10 MG/ML IV BOLUS
INTRAVENOUS | Status: DC | PRN
  Administered 2017-11-19: 200 mg via INTRAVENOUS

## 2017-11-19 MED ORDER — SUGAMMADEX SODIUM 200 MG/2ML IV SOLN
INTRAVENOUS | Status: DC | PRN
  Administered 2017-11-19: 157.6 mg via INTRAVENOUS

## 2017-11-19 MED ORDER — ONDANSETRON HCL 4 MG/2ML IJ SOLN
4.0000 mg | Freq: Once | INTRAMUSCULAR | Status: AC
  Administered 2017-11-19: 4 mg via INTRAVENOUS

## 2017-11-19 MED ORDER — ROCURONIUM BROMIDE 100 MG/10ML IV SOLN
INTRAVENOUS | Status: DC | PRN
  Administered 2017-11-19: 30 mg via INTRAVENOUS

## 2017-11-19 MED ORDER — ONDANSETRON HCL 4 MG/2ML IJ SOLN
INTRAMUSCULAR | Status: AC
  Filled 2017-11-19: qty 2

## 2017-11-19 MED ORDER — PHENYLEPHRINE HCL 10 MG/ML IJ SOLN
INTRAMUSCULAR | Status: DC | PRN
  Administered 2017-11-19: 80 ug via INTRAVENOUS
  Administered 2017-11-19: 100 ug via INTRAVENOUS
  Administered 2017-11-19: 80 ug via INTRAVENOUS

## 2017-11-19 MED ORDER — BUPIVACAINE-EPINEPHRINE 0.5% -1:200000 IJ SOLN
INTRAMUSCULAR | Status: DC | PRN
  Administered 2017-11-19: 10 mL

## 2017-11-19 MED ORDER — CEFAZOLIN SODIUM-DEXTROSE 2-3 GM-%(50ML) IV SOLR
INTRAVENOUS | Status: DC | PRN
Start: 2017-11-19 — End: 2017-11-19
  Administered 2017-11-19: 2 g via INTRAVENOUS

## 2017-11-19 MED ORDER — ONDANSETRON HCL 4 MG/2ML IJ SOLN
INTRAMUSCULAR | Status: DC | PRN
  Administered 2017-11-19: 4 mg via INTRAVENOUS

## 2017-11-19 MED ORDER — FENTANYL CITRATE (PF) 100 MCG/2ML IJ SOLN: 25.0000 ug | INTRAMUSCULAR | Status: DC | PRN

## 2017-11-19 MED ORDER — 0.9 % SODIUM CHLORIDE (POUR BTL) OPTIME
TOPICAL | Status: DC | PRN
  Administered 2017-11-19: 1000 mL

## 2017-11-19 MED ORDER — ENSURE PRE-SURGERY PO LIQD: 592.0000 mL | Freq: Once | ORAL | Status: DC

## 2017-11-19 MED ORDER — LIDOCAINE HCL (CARDIAC) 20 MG/ML IV SOLN
INTRAVENOUS | Status: DC | PRN
  Administered 2017-11-19: 60 mg via INTRAVENOUS

## 2017-11-19 SURGICAL SUPPLY — 53 items
ADH SKN CLS APL DERMABOND .7 (GAUZE/BANDAGES/DRESSINGS) ×1
APPLIER CLIP 9.375 MED OPEN (MISCELLANEOUS) ×3
APR CLP MED 9.3 20 MLT OPN (MISCELLANEOUS) ×1
BINDER BREAST LRG (GAUZE/BANDAGES/DRESSINGS) IMPLANT
BINDER BREAST XLRG (GAUZE/BANDAGES/DRESSINGS) IMPLANT
BLADE SURG 15 STRL LF DISP TIS (BLADE) ×1 IMPLANT
BLADE SURG 15 STRL SS (BLADE) ×3
CANISTER SUCT 3000ML PPV (MISCELLANEOUS) ×3 IMPLANT
CHLORAPREP W/TINT 26ML (MISCELLANEOUS) ×3 IMPLANT
CLIP APPLIE 9.375 MED OPEN (MISCELLANEOUS) IMPLANT
CLOSURE WOUND 1/2 X4 (GAUZE/BANDAGES/DRESSINGS) ×1
COVER PROBE W GEL 5X96 (DRAPES) ×3 IMPLANT
COVER SURGICAL LIGHT HANDLE (MISCELLANEOUS) ×3 IMPLANT
DERMABOND ADVANCED (GAUZE/BANDAGES/DRESSINGS) ×2
DERMABOND ADVANCED .7 DNX12 (GAUZE/BANDAGES/DRESSINGS) ×1 IMPLANT
DEVICE DUBIN SPECIMEN MAMMOGRA (MISCELLANEOUS) ×3 IMPLANT
DRAPE CHEST BREAST 15X10 FENES (DRAPES) ×3 IMPLANT
DRAPE UTILITY XL STRL (DRAPES) ×3 IMPLANT
ELECT COATED BLADE 2.86 ST (ELECTRODE) ×3 IMPLANT
ELECT REM PT RETURN 9FT ADLT (ELECTROSURGICAL) ×3
ELECTRODE REM PT RTRN 9FT ADLT (ELECTROSURGICAL) ×1 IMPLANT
GLOVE BIO SURGEON STRL SZ7 (GLOVE) ×6 IMPLANT
GLOVE BIOGEL PI IND STRL 6.5 (GLOVE) IMPLANT
GLOVE BIOGEL PI IND STRL 7.5 (GLOVE) ×1 IMPLANT
GLOVE BIOGEL PI INDICATOR 6.5 (GLOVE) ×4
GLOVE BIOGEL PI INDICATOR 7.5 (GLOVE) ×2
GLOVE SURG SS PI 6.0 STRL IVOR (GLOVE) ×4 IMPLANT
GOWN STRL REUS W/ TWL LRG LVL3 (GOWN DISPOSABLE) ×2 IMPLANT
GOWN STRL REUS W/TWL LRG LVL3 (GOWN DISPOSABLE) ×6
ILLUMINATOR WAVEGUIDE N/F (MISCELLANEOUS) ×3 IMPLANT
KIT BASIN OR (CUSTOM PROCEDURE TRAY) ×3 IMPLANT
KIT MARKER MARGIN INK (KITS) ×3 IMPLANT
NDL HYPO 25GX1X1/2 BEV (NEEDLE) ×1 IMPLANT
NEEDLE HYPO 25GX1X1/2 BEV (NEEDLE) ×3 IMPLANT
NS IRRIG 1000ML POUR BTL (IV SOLUTION) ×3 IMPLANT
PACK SURGICAL SETUP 50X90 (CUSTOM PROCEDURE TRAY) ×3 IMPLANT
PENCIL BUTTON HOLSTER BLD 10FT (ELECTRODE) ×3 IMPLANT
SPONGE LAP 18X18 X RAY DECT (DISPOSABLE) ×3 IMPLANT
STRIP CLOSURE SKIN 1/2X4 (GAUZE/BANDAGES/DRESSINGS) ×2 IMPLANT
SUT MNCRL AB 4-0 PS2 18 (SUTURE) ×3 IMPLANT
SUT MON AB 5-0 PS2 18 (SUTURE) ×2 IMPLANT
SUT SILK 2 0 SH (SUTURE) IMPLANT
SUT VIC AB 2-0 SH 27 (SUTURE) ×3
SUT VIC AB 2-0 SH 27XBRD (SUTURE) ×1 IMPLANT
SUT VIC AB 3-0 SH 27 (SUTURE) ×6
SUT VIC AB 3-0 SH 27X BRD (SUTURE) ×1 IMPLANT
SYR BULB 3OZ (MISCELLANEOUS) ×3 IMPLANT
SYR CONTROL 10ML LL (SYRINGE) ×3 IMPLANT
TOWEL OR 17X24 6PK STRL BLUE (TOWEL DISPOSABLE) ×3 IMPLANT
TOWEL OR 17X26 10 PK STRL BLUE (TOWEL DISPOSABLE) ×3 IMPLANT
TUBE CONNECTING 12'X1/4 (SUCTIONS) ×1
TUBE CONNECTING 12X1/4 (SUCTIONS) ×2 IMPLANT
YANKAUER SUCT BULB TIP NO VENT (SUCTIONS) ×3 IMPLANT

## 2017-11-19 NOTE — Anesthesia Procedure Notes (Signed)
Procedure Name: Intubation Date/Time: 11/19/2017 12:00 PM Performed by: Lavell Luster, CRNA Pre-anesthesia Checklist: Patient identified, Emergency Drugs available, Patient being monitored, Timeout performed and Suction available Patient Re-evaluated:Patient Re-evaluated prior to induction Oxygen Delivery Method: Circle system utilized Preoxygenation: Pre-oxygenation with 100% oxygen Induction Type: IV induction Ventilation: Mask ventilation without difficulty Laryngoscope Size: Mac and 3 Grade View: Grade I Tube type: Oral Tube size: 7.5 mm Number of attempts: 1 Airway Equipment and Method: Stylet Placement Confirmation: ETT inserted through vocal cords under direct vision,  positive ETCO2 and breath sounds checked- equal and bilateral Secured at: 22 cm Tube secured with: Tape Dental Injury: Teeth and Oropharynx as per pre-operative assessment  Comments: Attempted LMA placement x 2, large leak noted, difficult to seat.  Switched to ETT, Grade I view noted.  Henderson Cloud, CRNA

## 2017-11-19 NOTE — Op Note (Signed)
Preoperative diagnosis: Left breast cancer, clinical stage I Postoperative diagnosis: same as above Procedure:Left breast seed guided lumpectomy Surgeon: Dr Serita Grammes [id number] Anes: general  Specimens: left breast tissue marked with paint Complications none Drains none Sponge count correct Dispo to pacu stable  Indications: This is an 59yof with new left breast cancer.We discussed options and have elected to proceed with lumpectomy.She had radioactive seed placed prior to beginning and the mammograms were available for review.   Procedure: After informed consent was obtained the patient was taken to the operating room. She was given antibiotics. Sequential compression devices were on her legs. She was then placed under general anesthesia with an LMA. Then she was prepped and draped in the standard sterile surgical fashion. Surgical timeout was then performed.  I then located the seed in theupper outer leftbreastI infiltrated marcaine in the skin and then madeaperiareolarincisionto attempt to hide the scar.I then used the neoprobe to remove the seed and the surrounding tissue with attempt to get clear margins. I marked this with paint. MM confirmed removal of seed and theclip.I then obtained hemostasis. This was marked as above.I closed with with 2-0 vicryl to approximate breast tissue. The skin was closed with 3-0 vicryl and 5-0 monocryl. Glue and steristrips were placed.

## 2017-11-19 NOTE — Anesthesia Preprocedure Evaluation (Addendum)
Anesthesia Evaluation  Patient identified by MRN, date of birth, ID band Patient awake    Reviewed: Allergy & Precautions, NPO status   Airway Mallampati: II  TM Distance: >3 FB     Dental   Pulmonary    breath sounds clear to auscultation       Cardiovascular hypertension,  Rhythm:Regular Rate:Normal     Neuro/Psych  Headaches,    GI/Hepatic Neg liver ROS, GERD  ,  Endo/Other  negative endocrine ROS  Renal/GU negative Renal ROS     Musculoskeletal  (+) Arthritis ,   Abdominal   Peds  Hematology   Anesthesia Other Findings   Reproductive/Obstetrics                           Anesthesia Physical Anesthesia Plan  ASA: III  Anesthesia Plan: General   Post-op Pain Management:    Induction: Intravenous  PONV Risk Score and Plan: Treatment may vary due to age or medical condition  Airway Management Planned: Oral ETT and LMA  Additional Equipment:   Intra-op Plan:   Post-operative Plan: Extubation in OR  Informed Consent: I have reviewed the patients History and Physical, chart, labs and discussed the procedure including the risks, benefits and alternatives for the proposed anesthesia with the patient or authorized representative who has indicated his/her understanding and acceptance.   Dental advisory given  Plan Discussed with: CRNA and Anesthesiologist  Anesthesia Plan Comments:         Anesthesia Quick Evaluation

## 2017-11-19 NOTE — H&P (Signed)
12 yof referred by Dr Lindi Adie for new left breast cancer. she had a right breast cancer in 2009. this was a grade I 9 mm IDC that was er/pr pos and her 2 negative. had sn that was negative. she was treated with adjuvant radiation and then on femara/tamoxifen for five years. no nipple dc or mass was noted. this was routine screening mm. she had c density breasts. there was possible left breast asymmetry. right is negative. this was confirmed on spot views and there was irregular mass with distortion in upper left breast at 12 oclock. US shows at 1130 a 4x4x4 mm mass. this underwent a core biopsy that showed an IDC grade 2, er/pr pos, her 2 negative. her nodes were negative on [place]. she is here with her son and one of her daughters to discuss options   Past Surgical History  Appendectomy  Breast Mass; Local Excision  Right. Hysterectomy (not due to cancer) - Complete   Diagnostic Studies History Colonoscopy  1-5 years ago Mammogram  within last year  Allergies  CELECOXIB  ERYTHROMYCIN  IODINE  NABUMETONE  NSAIDs  Vioxx *ANALGESICS - ANTI-INFLAMMATORY*   Medication History  AmLODIPine Besylate (5MG  Tablet, Oral) Active. Tylenol (325MG  Tablet, Oral) Active. Aspirin (81MG  Tablet, Oral) Active. Vitamin D (1000UNIT Tablet, Oral) Active. Benadryl (25MG  Capsule, Oral) Active. Multivitamins/Minerals (Oral) Active. Fish Oil (1200MG  Capsule, Oral) Active. Medications Reconciled  Social History  Caffeine use  Carbonated beverages, Coffee, Tea. No alcohol use  No drug use  Tobacco use  Never smoker.  Family History  Breast Cancer  Sister. Colon Cancer  Mother. Prostate Cancer  Brother, Father.  Other Problems  Breast Cancer  Gastroesophageal Reflux Disease  High blood pressure   Review of Systems  General Not Present- Appetite Loss, Chills, Fatigue, Fever, Night Sweats, Weight Gain and Weight Loss. Skin Not Present- Change in Wart/Mole, Dryness,  Hives, Jaundice, New Lesions, Non-Healing Wounds, Rash and Ulcer. Respiratory Not Present- Bloody sputum, Chronic Cough, Difficulty Breathing, Snoring and Wheezing. Breast Not Present- Breast Mass, Breast Pain, Nipple Discharge and Skin Changes. Gastrointestinal Not Present- Abdominal Pain, Bloating, Bloody Stool, Change in Bowel Habits, Chronic diarrhea, Constipation, Difficulty Swallowing, Excessive gas, Gets full quickly at meals, Hemorrhoids, Indigestion, Nausea, Rectal Pain and Vomiting. Female Genitourinary Not Present- Frequency, Nocturia, Painful Urination, Pelvic Pain and Urgency. Musculoskeletal Not Present- Back Pain, Joint Pain, Joint Stiffness, Muscle Pain, Muscle Weakness and Swelling of Extremities. Neurological Not Present- Decreased Memory, Fainting, Headaches, Numbness, Seizures, Tingling, Tremor, Trouble walking and Weakness. Psychiatric Not Present- Anxiety, Bipolar, Change in Sleep Pattern, Depression, Fearful and Frequent crying. Endocrine Not Present- Cold Intolerance, Excessive Hunger, Hair Changes, Heat Intolerance, Hot flashes and New Diabetes. Hematology Not Present- Blood Thinners, Easy Bruising, Excessive bleeding, Gland problems, HIV and Persistent Infections.  Vitals  Weight: 173.5 lb Temp.: 55F  Pulse: 102 (Regular)  P.OX: 98% (Room air) BP: 164/72 (Sitting, Left Arm, Standard) Physical Exam  General Mental Status-Alert. Head and Neck Trachea-midline. Thyroid Gland Characteristics - normal size and consistency. Eye Sclera/Conjunctiva - Bilateral-No scleral icterus. Chest and Lung Exam Chest and lung exam reveals -quiet, even and easy respiratory effort with no use of accessory muscles and on auscultation, normal breath sounds, no adventitious sounds and normal vocal resonance. Breast Nipples-No Discharge. Breast Lump-No Palpable Breast Mass. Cardiovascular Cardiovascular examination reveals -normal heart sounds, regular rate and  rhythm with no murmurs. Neurologic Neurologic evaluation reveals -alert and oriented x 3 with no impairment of recent or remote memory. Lymphatic Head &  Neck General Head & Neck Lymphatics: Bilateral - Description - Normal. Axillary General Axillary Region: Bilateral - Description - Normal. Note: no [place] adenopathy  Assessment & Plan  CARCINOMA OF UPPER-INNER QUADRANT OF LEFT BREAST, ESTROGEN RECEPTOR POSITIVE (S31.594) Story: Left breast seed guided lumpectomy we discussed staging and pathophysiology of breast cancer. all of available treatments include surgery, chemotherapy, radiotherapy and antiestrogen therapy. she has early stage not aggressive breast cancer. I think due to t1a er/pr pos tumor she can forgo sentinel node biopsy in accordance with literature. options for removal of left breast tumor are lumpectomy vs mastectomy. we discussed that lumpectomy with negative margin vs mastectomy are equivalent for her. there is no advantage to mastectomy. I think radiation may be able to be avoided also if negative margins and truly is pathologically what it is clinically at this point. she is going to consider and call me back. I discussed seed guided lumpectomy with risks and recovery with patient. she will get [place] and let me know what she would like to do.

## 2017-11-19 NOTE — Transfer of Care (Signed)
Immediate Anesthesia Transfer of Care Note  Patient: Shannon Jensen  Procedure(s) Performed: BREAST LUMPECTOMY WITH RADIOACTIVE SEED LOCALIZATION (Left Breast)  Patient Location: PACU  Anesthesia Type:General  Level of Consciousness: awake and alert   Airway & Oxygen Therapy: Patient Spontanous Breathing  Post-op Assessment: Report given to RN and Post -op Vital signs reviewed and stable  Post vital signs: Reviewed and stable  Last Vitals:  Vitals:   11/19/17 0851 11/19/17 1250  BP: (!) 188/68   Pulse:    Resp:    Temp:  36.7 C  SpO2:      Last Pain:  Vitals:   11/19/17 0844  TempSrc: Oral         Complications: No apparent anesthesia complications

## 2017-11-19 NOTE — Interval H&P Note (Signed)
History and Physical Interval Note:  11/19/2017 11:15 AM  Shannon Jensen  has presented today for surgery, with the diagnosis of LEFT BREAST CANCER  The various methods of treatment have been discussed with the patient and family. After consideration of risks, benefits and other options for treatment, the patient has consented to  Procedure(s): BREAST LUMPECTOMY WITH RADIOACTIVE SEED LOCALIZATION (Left) as a surgical intervention .  The patient's history has been reviewed, patient examined, no change in status, stable for surgery.  I have reviewed the patient's chart and labs.  Questions were answered to the patient's satisfaction.     Rolm Bookbinder

## 2017-11-19 NOTE — Discharge Instructions (Signed)
[facility] °Office Phone Number [phone] °POST OP INSTRUCTIONS ° °Always review your discharge instruction sheet given to you by the facility where your surgery was performed. ° °IF YOU HAVE DISABILITY OR FAMILY LEAVE FORMS, YOU MUST BRING THEM TO THE OFFICE FOR PROCESSING.  DO NOT GIVE THEM TO YOUR DOCTOR. ° °1. A prescription for pain medication may be given to you upon discharge.  Take your pain medication as prescribed, if needed.  If narcotic pain medicine is not needed, then you may take acetaminophen (Tylenol), naprosyn (Alleve) or ibuprofen (Advil) as needed. °2. Take your usually prescribed medications unless otherwise directed °3. If you need a refill on your pain medication, please contact your pharmacy.  They will contact our office to request authorization.  Prescriptions will not be filled after 5pm or on week-ends. °4. You should eat very light the first 24 hours after surgery, such as soup, crackers, pudding, etc.  Resume your normal diet the day after surgery. °5. Most patients will experience some swelling and bruising in the breast.  Ice packs and a good support bra will help.  Wear the breast binder provided or a sports bra for 72 hours day and night.  After that wear a sports bra during the day until you return to the office. Swelling and bruising can take several days to resolve.  °6. It is common to experience some constipation if taking pain medication after surgery.  Increasing fluid intake and taking a stool softener will usually help or prevent this problem from occurring.  A mild laxative (Milk of Magnesia or Miralax) should be taken according to package directions if there are no bowel movements after 48 hours. °7. Unless discharge instructions indicate otherwise, you may remove your bandages 48 hours after surgery and you may shower at that time.  You may have steri-strips (small skin tapes) in place directly over the incision.  These strips should be left on the  skin for 7-10 days and will come off on their own.  If your surgeon used skin glue on the incision, you may shower in 24 hours.  The glue will flake off over the next 2-3 weeks.  Any sutures or staples will be removed at the office during your follow-up visit. °8. ACTIVITIES:  You may resume regular daily activities (gradually increasing) beginning the next day.  Wearing a good support bra or sports bra minimizes pain and swelling.  You may have sexual intercourse when it is comfortable. °a. You may drive when you no longer are taking prescription pain medication, you can comfortably wear a seatbelt, and you can safely maneuver your car and apply brakes. °b. RETURN TO WORK:  ______________________________________________________________________________________ °9. You should see your doctor in the office for a follow-up appointment approximately two weeks after your surgery.  Your doctor’s nurse will typically make your follow-up appointment when she calls you with your pathology report.  Expect your pathology report 3-4 business days after your surgery.  You may call to check if you do not hear from us after three days. °10. OTHER INSTRUCTIONS: _______________________________________________________________________________________________ _____________________________________________________________________________________________________________________________________ °_____________________________________________________________________________________________________________________________________ °_____________________________________________________________________________________________________________________________________ ° °WHEN TO CALL DR Meleny Tregoning: °1. Fever over 101.0 °2. Nausea and/or vomiting. °3. Extreme swelling or bruising. °4. Continued bleeding from incision. °5. Increased pain, redness, or drainage from the incision. ° °The clinic staff is available to answer your questions during regular  business hours.  Please don’t hesitate to call and ask to speak to one of the nurses for clinical concerns.  If you   have a medical emergency, go to the nearest emergency room or call 911.  A surgeon from [facility] is always on call at the hospital. ° °For further questions, please visit [email or web address] ° °

## 2017-11-19 NOTE — Anesthesia Postprocedure Evaluation (Signed)
Anesthesia Post Note  Patient: Shannon Jensen  Procedure(s) Performed: BREAST LUMPECTOMY WITH RADIOACTIVE SEED LOCALIZATION (Left Breast)     Patient location during evaluation: PACU Anesthesia Type: General Level of consciousness: awake Pain management: pain level controlled Vital Signs Assessment: post-procedure vital signs reviewed and stable Respiratory status: spontaneous breathing Cardiovascular status: stable Anesthetic complications: no    Last Vitals:  Vitals:   11/19/17 1300 11/19/17 1315  BP: (!) 142/74 (!) 151/74  Pulse: 77 73  Resp: 17 18  Temp:    SpO2: 100% 97%    Last Pain:  Vitals:   11/19/17 1315  TempSrc:   PainSc: 0-No pain                 Merci Walthers

## 2017-11-19 NOTE — Anesthesia Postprocedure Evaluation (Signed)
Anesthesia Post Note  Patient: Shannon Jensen  Procedure(s) Performed: BREAST LUMPECTOMY WITH RADIOACTIVE SEED LOCALIZATION (Left Breast)     Patient location during evaluation: PACU Anesthesia Type: General Level of consciousness: awake Pain management: pain level controlled Vital Signs Assessment: post-procedure vital signs reviewed and stable Respiratory status: spontaneous breathing Cardiovascular status: stable Anesthetic complications: no    Last Vitals:  Vitals:   11/19/17 1415 11/19/17 1500  BP: (!) 153/75 (!) 143/69  Pulse: 75 74  Resp: 20   Temp:    SpO2: 97% 97%    Last Pain:  Vitals:   11/19/17 1415  TempSrc:   PainSc: 0-No pain                 Belal Scallon

## 2017-11-19 NOTE — Progress Notes (Signed)
Per Dr. Donne Hazel ok to place IV in left arm

## 2017-11-20 ENCOUNTER — Encounter (HOSPITAL_COMMUNITY): Payer: Self-pay | Admitting: General Surgery

## 2017-11-28 ENCOUNTER — Inpatient Hospital Stay: Payer: Medicare Other | Attending: Hematology and Oncology | Admitting: Hematology and Oncology

## 2017-11-28 ENCOUNTER — Telehealth: Payer: Self-pay | Admitting: Hematology and Oncology

## 2017-11-28 DIAGNOSIS — Z853 Personal history of malignant neoplasm of breast: Secondary | ICD-10-CM | POA: Insufficient documentation

## 2017-11-28 DIAGNOSIS — C50212 Malignant neoplasm of upper-inner quadrant of left female breast: Secondary | ICD-10-CM | POA: Insufficient documentation

## 2017-11-28 DIAGNOSIS — R110 Nausea: Secondary | ICD-10-CM | POA: Diagnosis not present

## 2017-11-28 DIAGNOSIS — Z170 Estrogen receptor positive status [ER+]: Secondary | ICD-10-CM | POA: Insufficient documentation

## 2017-11-28 MED ORDER — TAMOXIFEN CITRATE 10 MG PO TABS: 10.0000 mg | ORAL_TABLET | Freq: Every day | ORAL | 3 refills | Status: DC

## 2017-11-28 NOTE — Progress Notes (Signed)
Patient Care Team: [name], MD as PCP - General (Family Medicine)  DIAGNOSIS:  Encounter Diagnosis  Name Primary?  . Malignant neoplasm of upper-inner quadrant of left breast in female, estrogen receptor positive ([place])     SUMMARY OF ONCOLOGIC HISTORY:   History of right breast cancer   05/24/2008 Surgery     Right breast lumpectomy revealing 0.9 cm grade 1 invasive ductal carcinoma with negative margins, ER positive, PR positive, HER-2 negative      06/29/2008 - 08/06/2008 Radiation Therapy     adjuvant radiation therapy      10/11/2008 - 10/08/2013 Anti-estrogen oral therapy     antiestrogen therapy with Femara then switched to tamoxifen due to osteoporosis completed 5 years of tamoxifen , patient declined extended adjuvant therapy.       Malignant neoplasm of upper-inner quadrant of left breast in female, estrogen receptor positive ([place])   09/18/2017 Initial Diagnosis    Left breast biopsy 11 o'clock position: IDC grade 2, 0.4 cm by mammogram.  The biopsy had 0.5 cm of material.  T1 a N0 stage I a      11/19/2017 Surgery    Left lumpectomy: IDC grade 1, 0.6 cm, low-grade DCIS, margins negative, T1 be N0 stage I a, ER 100%, PR 70%, HER-2 negative, Ki-67 2%       CHIEF COMPLIANT: Follow-up after left lumpectomy  INTERVAL HISTORY: Shannon Jensen is a 84 year old with above-mentioned history of recurrent breast cancer, initially in the right breast and now in the left breast.  She underwent recent left lumpectomy and is here today accompanied by her family to discuss the results.  Since the surgery she has had mild nausea for which she was prescribed nausea medication.  She has not yet taken it.  She is otherwise recovering very well.  Denies any significant pain.  REVIEW OF SYSTEMS:   Constitutional: Denies fevers, chills or abnormal weight loss Eyes: Denies blurriness of vision Ears, nose, mouth, throat, and face: Denies mucositis or sore throat Respiratory: Denies  cough, dyspnea or wheezes Cardiovascular: Denies palpitation, chest discomfort Gastrointestinal:  Denies nausea, heartburn or change in bowel habits Skin: Denies abnormal skin rashes Lymphatics: Denies new lymphadenopathy or easy bruising Neurological:Denies numbness, tingling or new weaknesses Behavioral/Psych: Mood is stable, no new changes  Extremities: No lower extremity edema Breast: Recent left lumpectomy All other systems were reviewed with the patient and are negative.  I have reviewed the past medical history, past surgical history, social history and family history with the patient and they are unchanged from previous note.  ALLERGIES:  is allergic to celecoxib; erythromycin; iodine; nabumetone; nsaids; and vioxx [rofecoxib].  MEDICATIONS:  Current Outpatient Medications  Medication Sig Dispense Refill  . acetaminophen (TYLENOL) 325 MG tablet Take 650 mg by mouth every 6 (six) hours as needed for moderate pain or headache.     Marland Kitchen amLODipine (NORVASC) 5 MG tablet TAKE 1 TABLET BY MOUTH DAILY. OFFICE VISIT DUE NOW (Patient taking differently: Take 5 mg by mouth daily. ) 90 tablet 3  . Artificial Tear Ointment (DRY EYES OP) Place 1-2 drops into both eyes 2 (two) times daily as needed (for dry eyes).    Marland Kitchen aspirin 81 MG tablet Take 81 mg by mouth daily.      . Cholecalciferol (VITAMIN D) 2000 units CAPS Take 2,000 Units by mouth daily.    . diphenhydrAMINE (BENADRYL) 25 mg capsule Take 2 capsules (50 mg total) by mouth once. (Patient not taking: Reported  on 11/07/2017) 2 capsule 0  . Multiple Vitamins-Minerals (RA HAIR/SKIN/NAILS) TABS Take 1 tablet by mouth daily.      . Multiple Vitamins-Minerals (WOMENS ONE DAILY) TABS Take 1 tablet by mouth daily.      . Omega-3 Fatty Acids (FISH OIL) 1200 MG CAPS Take 1,200 mg by mouth daily.     . tamoxifen (NOLVADEX) 10 MG tablet Take 1 tablet (10 mg total) by mouth daily. 90 tablet 3   No current facility-administered medications for this  visit.     PHYSICAL EXAMINATION: ECOG PERFORMANCE STATUS: 1 - Symptomatic but completely ambulatory  Vitals:   11/28/17 1500  BP: (!) 160/94  Pulse: 89  Resp: 18  Temp: 97.9 F (36.6 C)  SpO2: 100%   Filed Weights   11/28/17 1500  Weight: 171 lb (77.6 kg)    GENERAL:alert, no distress and comfortable SKIN: skin color, texture, turgor are normal, no rashes or significant lesions EYES: normal, Conjunctiva are pink and non-injected, sclera clear OROPHARYNX:no exudate, no erythema and lips, buccal mucosa, and tongue normal  NECK: supple, thyroid normal size, non-tender, without nodularity LYMPH:  no palpable lymphadenopathy in the cervical, axillary or inguinal LUNGS: clear to auscultation and percussion with normal breathing effort HEART: regular rate & rhythm and no murmurs and no lower extremity edema ABDOMEN:abdomen soft, non-tender and normal bowel sounds MUSCULOSKELETAL:no cyanosis of digits and no clubbing  NEURO: alert & oriented x 3 with fluent speech, no focal motor/sensory deficits EXTREMITIES: No lower extremity edema  LABORATORY DATA:  I have reviewed the data as listed CMP Latest Ref Rng & Units 11/12/2017 08/27/2016 11/07/2015  Glucose 65 - 99 mg/dL 96 89 85  BUN 6 - 20 mg/dL 13 12 9.6  Creatinine 0.44 - 1.00 mg/dL 0.88 0.83 0.9  Sodium 135 - 145 mmol/L 139 138 139  Potassium 3.5 - 5.1 mmol/L 4.0 4.0 3.9  Chloride 101 - 111 mmol/L 104 105 -  CO2 22 - 32 mmol/L '26 25 24  ' Calcium 8.9 - 10.3 mg/dL 10.2 9.5 9.7  Total Protein 6.1 - 8.1 g/dL - 7.0 7.3  Total Bilirubin 0.2 - 1.2 mg/dL - 0.7 0.98  Alkaline Phos 33 - 130 U/L - 65 81  AST 10 - 35 U/L - 20 20  ALT 6 - 29 U/L - 13 12    Lab Results  Component Value Date   WBC 8.2 11/12/2017   HGB 13.5 11/12/2017   HCT 40.8 11/12/2017   MCV 92.1 11/12/2017   PLT 215 11/12/2017   NEUTROABS 4,408 08/27/2016    ASSESSMENT & PLAN:  Malignant neoplasm of upper-inner quadrant of left breast in female, estrogen  receptor positive ([facility]) 09/18/2017:Left breast biopsy 11 o'clock position: IDC grade 2 11/19/2017:Left lumpectomy: IDC grade 1, 0.6 cm, low-grade DCIS, margins negative, T1 be N0 stage I a, ER 100%, PR 70%, HER-2 negative, Ki-67 2%  Recommendations: 1. patient is not keen on doing radiation 2. Adjuvant antiestrogen therapy with tamoxifen 10 mg daily will be prescribed.  Patient previously had tamoxifen and apparently had side effects that she could not manage.  She could not tolerate aromatase inhibitor therapy as well.  She also has osteoporosis and because of this we selected tamoxifen.   History of right breast cancer Right breast invasive ductal carcinoma T1b N0 M0 stage IA diagnosis in 2009 status post lumpectomy 05/24/2008 ER positive PR positive HER-2 negative status post adjuvant radiation and took 5 years of antiestrogen therapy with tamoxifen completed 10/08/2013 Patient was offered  extended adjuvant therapy but she declined   Return to clinic in 3 months for toxicity check and follow-up.  I spent 25 minutes talking to the patient of which more than half was spent in counseling and coordination of care.  No orders of the defined types were placed in this encounter.  The patient has a good understanding of the overall plan. she agrees with it. she will call with any problems that may develop before the next visit here.   Harriette Ohara, MD 11/28/17

## 2017-11-28 NOTE — Assessment & Plan Note (Signed)
09/18/2017:Left breast biopsy 11 o'clock position: IDC grade 2 11/19/2017:Left lumpectomy: IDC grade 1, 0.6 cm, low-grade DCIS, margins negative, T1 be N0 stage I a, ER 100%, PR 70%, HER-2 negative, Ki-67 2%  Recommendations: 1. patient is not keen on doing radiation.  She would like to discuss this with me after her surgery. 2. Adjuvant antiestrogen therapy   History of right breast cancer Right breast invasive ductal carcinoma T1b N0 M0 stage IA diagnosis in 2009 status post lumpectomy 05/24/2008 ER positive PR positive HER-2 negative status post adjuvant radiation and took 5 years of antiestrogen therapy with tamoxifen completed 10/08/2013 Patient was offered extended adjuvant therapy but she declined

## 2017-11-28 NOTE — Telephone Encounter (Signed)
Gave patient AVs and calendar of upcoming June appointments. °

## 2017-12-09 ENCOUNTER — Telehealth: Payer: Self-pay

## 2017-12-09 NOTE — Telephone Encounter (Signed)
Returned pt call regarding questions about tamoxifen. Informed patient there is not a specific time of day that is better to take it than others and it is safe to take with her morning medications. No further questions at this time.  Cyndia Bent RN

## 2018-01-14 ENCOUNTER — Telehealth: Payer: Self-pay | Admitting: Family Medicine

## 2018-01-14 ENCOUNTER — Encounter: Payer: Self-pay | Admitting: Family Medicine

## 2018-01-14 ENCOUNTER — Ambulatory Visit: Payer: Medicare Other | Admitting: Family Medicine

## 2018-01-14 VITALS — BP 164/92 | HR 85 | Temp 98.6°F | Ht 62.0 in | Wt 172.2 lb

## 2018-01-14 DIAGNOSIS — R05 Cough: Secondary | ICD-10-CM | POA: Diagnosis not present

## 2018-01-14 DIAGNOSIS — L309 Dermatitis, unspecified: Secondary | ICD-10-CM | POA: Diagnosis not present

## 2018-01-14 DIAGNOSIS — R059 Cough, unspecified: Secondary | ICD-10-CM

## 2018-01-14 DIAGNOSIS — I10 Essential (primary) hypertension: Secondary | ICD-10-CM

## 2018-01-14 MED ORDER — AMOXICILLIN 875 MG PO TABS: 875.0000 mg | ORAL_TABLET | Freq: Two times a day (BID) | ORAL | 0 refills | Status: DC

## 2018-01-14 MED ORDER — OLMESARTAN MEDOXOMIL 20 MG PO TABS: 20.0000 mg | ORAL_TABLET | Freq: Every day | ORAL | 0 refills | Status: DC

## 2018-01-14 NOTE — Telephone Encounter (Signed)
[facility] in [place] called Pt there to pick up amoxil rx and said she was supposed to have another rx for bp medications   Please call

## 2018-01-14 NOTE — Patient Instructions (Signed)
Try Delsym during the day and NyQuil at night.  I will call the antibiotic again but give it a day or 2 to see if you turn the corner

## 2018-01-14 NOTE — Progress Notes (Signed)
   Subjective:    Patient ID: Shannon Jensen, female    DOB: 04/18/1928, 84 y.o.   MRN: [id number]  HPI He complains of a one-week history of started with sore throat followed by a cough and headache.  Cough has continued.  No fever, chills, earache.  She also has concerns over her right lower medial leg and a rash that is occurring in that area.  She has been using an OTC medication and states that it is getting better.  Her blood pressure is also of a concern for her.  She mentioned Azor.  She is wondering whether she needs to have another medication added.   Review of Systems     Objective:   Physical Exam Alert and in no distress. Tympanic membranes and canals are normal. Pharyngeal area is normal. Neck is supple without adenopathy or thyromegaly. Cardiac exam shows a regular sinus rhythm without murmurs or gallops. Lungs are clear to auscultation. Exam of the right lower extremity does show slight reddish-brown erythema that is well demarcated and looks like it is actually getting smaller.       Assessment & Plan:  Cough - Plan: amoxicillin (AMOXIL) 875 MG tablet  Essential hypertension - Plan: olmesartan (BENICAR) 20 MG tablet  Dermatitis I recommend that she not take the amoxicillin but wait a day or 2 to see if her symptoms clear up on their own.  I will also call in olmesartan.  She will continue on amlodipine.  I gave her enough for 60 days which should be about the time she will run out of both and she is to return here for blood pressure recheck. Recommend continued conservative care for the rash.

## 2018-02-27 ENCOUNTER — Telehealth: Payer: Self-pay | Admitting: Hematology and Oncology

## 2018-02-27 ENCOUNTER — Inpatient Hospital Stay: Payer: Medicare Other | Attending: Hematology and Oncology | Admitting: Hematology and Oncology

## 2018-02-27 DIAGNOSIS — N951 Menopausal and female climacteric states: Secondary | ICD-10-CM | POA: Diagnosis not present

## 2018-02-27 DIAGNOSIS — Z170 Estrogen receptor positive status [ER+]: Secondary | ICD-10-CM | POA: Diagnosis not present

## 2018-02-27 DIAGNOSIS — Z853 Personal history of malignant neoplasm of breast: Secondary | ICD-10-CM

## 2018-02-27 DIAGNOSIS — C50212 Malignant neoplasm of upper-inner quadrant of left female breast: Secondary | ICD-10-CM

## 2018-02-27 NOTE — Progress Notes (Signed)
Patient Care Team: [name], MD as PCP - General (Family Medicine)  DIAGNOSIS:  Encounter Diagnosis  Name Primary?  . Malignant neoplasm of upper-inner quadrant of left breast in female, estrogen receptor positive ([place])     SUMMARY OF ONCOLOGIC HISTORY:   History of right breast cancer   05/24/2008 Surgery     Right breast lumpectomy revealing 0.9 cm grade 1 invasive ductal carcinoma with negative margins, ER positive, PR positive, HER-2 negative      06/29/2008 - 08/06/2008 Radiation Therapy     adjuvant radiation therapy      10/11/2008 - 10/08/2013 Anti-estrogen oral therapy     antiestrogen therapy with Femara then switched to tamoxifen due to osteoporosis completed 5 years of tamoxifen , patient declined extended adjuvant therapy.       Malignant neoplasm of upper-inner quadrant of left breast in female, estrogen receptor positive ([place])   09/18/2017 Initial Diagnosis    Left breast biopsy 11 o'clock position: IDC grade 2, 0.4 cm by mammogram.  The biopsy had 0.5 cm of material.  T1 a N0 stage I a      11/19/2017 Surgery    Left lumpectomy: IDC grade 1, 0.6 cm, low-grade DCIS, margins negative, T1 be N0 stage I a, ER 100%, PR 70%, HER-2 negative, Ki-67 2%      11/28/2017 -  Anti-estrogen oral therapy    Tamoxifen 20 mg daily, patient refused radiation       CHIEF COMPLIANT: Follow-up on tamoxifen therapy  INTERVAL HISTORY: Shannon Jensen is a 84 year old with above-mentioned history of left breast cancer who underwent lumpectomy and was started on tamoxifen and is here today to discuss any side effects or tamoxifen therapy.  She appears to be tolerating tamoxifen extremely well.  She does not have any hot flashes or myalgias.  REVIEW OF SYSTEMS:   Constitutional: Denies fevers, chills or abnormal weight loss Eyes: Denies blurriness of vision Ears, nose, mouth, throat, and face: Denies mucositis or sore throat Respiratory: Denies cough, dyspnea or  wheezes Cardiovascular: Denies palpitation, chest discomfort Gastrointestinal:  Denies nausea, heartburn or change in bowel habits Skin: Denies abnormal skin rashes Lymphatics: Denies new lymphadenopathy or easy bruising Neurological:Denies numbness, tingling or new weaknesses Behavioral/Psych: Mood is stable, no new changes  Extremities: No lower extremity edema Breast:  denies any pain or lumps or nodules in either breasts All other systems were reviewed with the patient and are negative.  I have reviewed the past medical history, past surgical history, social history and family history with the patient and they are unchanged from previous note.  ALLERGIES:  is allergic to celecoxib; erythromycin; iodine; nabumetone; nsaids; and vioxx [rofecoxib].  MEDICATIONS:  Current Outpatient Medications  Medication Sig Dispense Refill  . acetaminophen (TYLENOL) 325 MG tablet Take 650 mg by mouth every 6 (six) hours as needed for moderate pain or headache.     Marland Kitchen amLODipine (NORVASC) 5 MG tablet TAKE 1 TABLET BY MOUTH DAILY. OFFICE VISIT DUE NOW (Patient taking differently: Take 5 mg by mouth daily. ) 90 tablet 3  . Artificial Tear Ointment (DRY EYES OP) Place 1-2 drops into both eyes 2 (two) times daily as needed (for dry eyes).    . Cholecalciferol (VITAMIN D) 2000 units CAPS Take 2,000 Units by mouth daily.    . Multiple Vitamins-Minerals (WOMENS ONE DAILY) TABS Take 1 tablet by mouth daily.      Marland Kitchen olmesartan (BENICAR) 20 MG tablet Take 1 tablet (20 mg total) by  mouth daily. 60 tablet 0  . Omega-3 Fatty Acids (FISH OIL) 1200 MG CAPS Take 1,200 mg by mouth daily.     . tamoxifen (NOLVADEX) 10 MG tablet Take 1 tablet (10 mg total) by mouth daily. 90 tablet 3   No current facility-administered medications for this visit.     PHYSICAL EXAMINATION: ECOG PERFORMANCE STATUS: 1 - Symptomatic but completely ambulatory  Vitals:   02/27/18 1200  BP: (!) 159/89  Pulse: 79  Resp: 18  Temp: 98.3 F  (36.8 C)  SpO2: 99%   Filed Weights   02/27/18 1200  Weight: 170 lb 4.8 oz (77.2 kg)    GENERAL:alert, no distress and comfortable SKIN: skin color, texture, turgor are normal, no rashes or significant lesions EYES: normal, Conjunctiva are pink and non-injected, sclera clear OROPHARYNX:no exudate, no erythema and lips, buccal mucosa, and tongue normal  NECK: supple, thyroid normal size, non-tender, without nodularity LYMPH:  no palpable lymphadenopathy in the cervical, axillary or inguinal LUNGS: clear to auscultation and percussion with normal breathing effort HEART: regular rate & rhythm and no murmurs and no lower extremity edema ABDOMEN:abdomen soft, non-tender and normal bowel sounds MUSCULOSKELETAL:no cyanosis of digits and no clubbing  NEURO: alert & oriented x 3 with fluent speech, no focal motor/sensory deficits EXTREMITIES: No lower extremity edema  LABORATORY DATA:  I have reviewed the data as listed CMP Latest Ref Rng & Units 11/12/2017 08/27/2016 11/07/2015  Glucose 65 - 99 mg/dL 96 89 85  BUN 6 - 20 mg/dL 13 12 9.6  Creatinine 0.44 - 1.00 mg/dL 0.88 0.83 0.9  Sodium 135 - 145 mmol/L 139 138 139  Potassium 3.5 - 5.1 mmol/L 4.0 4.0 3.9  Chloride 101 - 111 mmol/L 104 105 -  CO2 22 - 32 mmol/L _0 Calcium 8.9 - 10.3 mg/dL 10.2 9.5 9.7  Total Protein 6.1 - 8.1 g/dL - 7.0 7.3  Total Bilirubin 0.2 - 1.2 mg/dL - 0.7 0.98  Alkaline Phos 33 - 130 U/L - 65 81  AST 10 - 35 U/L - 20 20  ALT 6 - 29 U/L - 13 12    Lab Results  Component Value Date   WBC 8.2 11/12/2017   HGB 13.5 11/12/2017   HCT 40.8 11/12/2017   MCV 92.1 11/12/2017   PLT 215 11/12/2017   NEUTROABS 4,408 08/27/2016    ASSESSMENT & PLAN:  Malignant neoplasm of upper-inner quadrant of left breast in female, estrogen receptor positive ([facility]) 09/18/2017:Left breast biopsy 11 o'clock position: IDC grade 2 11/19/2017:Left lumpectomy: IDC grade 1, 0.6 cm, low-grade DCIS, margins negative, T1 be N0 stage I  a, ER 100%, PR 70%, HER-2 negative, Ki-67 2%  (History of right breast cancer Right breast invasive ductal carcinoma T1b N0 M0 stage IA diagnosis in 2009 status post lumpectomy 05/24/2008 ER positive PR positive HER-2 negative status post adjuvant radiation and took 5 years of antiestrogen therapy with tamoxifen completed 10/08/2013 Patient was offered extended adjuvant therapy but she declined)   Current treatment: Tamoxifen 10 mg daily (patient refused radiation)   Tamoxifen toxicities: 1.  Hot flashes moderate in severity.  We discussed different remedies and decided that she would change the time that she takes tamoxifen to mornings.  If that works out then she would continue.  If not I discussed with her about reducing the dosage to 5 mg daily.  Return to clinic in 6 months for follow-up   No orders of the defined types were placed in this encounter.  The patient has a good understanding of the overall plan. she agrees with it. she will call with any problems that may develop before the next visit here.   Harriette Ohara, MD 02/27/18

## 2018-02-27 NOTE — Telephone Encounter (Signed)
Gave patient avs and calendar of upcoming December appointments.  °

## 2018-02-27 NOTE — Assessment & Plan Note (Signed)
09/18/2017:Left breast biopsy 11 o'clock position: IDC grade 2 11/19/2017:Left lumpectomy: IDC grade 1, 0.6 cm, low-grade DCIS, margins negative, T1 be N0 stage I a, ER 100%, PR 70%, HER-2 negative, Ki-67 2%  (History of right breast cancer Right breast invasive ductal carcinoma T1b N0 M0 stage IA diagnosis in 2009 status post lumpectomy 05/24/2008 ER positive PR positive HER-2 negative status post adjuvant radiation and took 5 years of antiestrogen therapy with tamoxifen completed 10/08/2013 Patient was offered extended adjuvant therapy but she declined)   Current treatment: Tamoxifen 10 mg daily (patient refused radiation)   Tamoxifen toxicities:  Return to clinic in 6 months for follow-up

## 2018-03-11 ENCOUNTER — Ambulatory Visit: Payer: Medicare Other | Admitting: Family Medicine

## 2018-03-11 ENCOUNTER — Encounter: Payer: Self-pay | Admitting: Family Medicine

## 2018-03-11 VITALS — BP 138/86 | HR 81 | Temp 97.9°F | Wt 171.0 lb

## 2018-03-11 DIAGNOSIS — I10 Essential (primary) hypertension: Secondary | ICD-10-CM | POA: Diagnosis not present

## 2018-03-11 MED ORDER — AMLODIPINE-OLMESARTAN 5-40 MG PO TABS: 1.0000 | ORAL_TABLET | Freq: Every day | ORAL | 11 refills | Status: DC

## 2018-03-11 NOTE — Progress Notes (Signed)
   Subjective:    Patient ID: Shannon Jensen, female    DOB: 1928-06-13, 84 y.o.   MRN: [id number]  HPI She is here for a blood pressure recheck.  Presently she is on amlodipine and olmesartan.  She apparently is having no difficulties with that.   Review of Systems     Objective:   Physical Exam Alert and in no distress.  Blood pressure is recorded       Assessment & Plan:  Essential hypertension - Plan: amLODipine-olmesartan (AZOR) 5-40 MG tablet  She would prefer to be on Azor instead of the divided doses.  I explained that this might be more expensive however she states that she did not want to be on generic.  Recheck here in 1 month.

## 2018-04-22 ENCOUNTER — Encounter: Payer: Self-pay | Admitting: Family Medicine

## 2018-04-22 ENCOUNTER — Ambulatory Visit: Payer: Medicare Other | Admitting: Family Medicine

## 2018-04-22 VITALS — BP 134/80 | HR 85 | Temp 98.3°F | Wt 169.4 lb

## 2018-04-22 DIAGNOSIS — I10 Essential (primary) hypertension: Secondary | ICD-10-CM | POA: Diagnosis not present

## 2018-04-22 MED ORDER — AMLODIPINE-OLMESARTAN 5-40 MG PO TABS: 1.0000 | ORAL_TABLET | Freq: Every day | ORAL | 3 refills | Status: DC

## 2018-04-22 NOTE — Progress Notes (Signed)
   Subjective:    Patient ID: Shannon Jensen, female    DOB: [birth year], 84 y.o.   MRN: [id number]  HPI She is here for a recheck.  She is now taking Azor and having no difficulties with that.   Review of Systems     Objective:   Physical Exam Alert and in no distress.  Blood pressure is recorded.       Assessment & Plan:  Essential hypertension I will continue her on Azor.  Recheck here on an as-needed basis or in 1 year.

## 2018-06-10 DIAGNOSIS — K219 Gastro-esophageal reflux disease without esophagitis: Secondary | ICD-10-CM | POA: Insufficient documentation

## 2018-07-14 ENCOUNTER — Other Ambulatory Visit: Payer: Medicare Other

## 2018-08-25 DIAGNOSIS — H9113 Presbycusis, bilateral: Secondary | ICD-10-CM | POA: Insufficient documentation

## 2018-08-27 NOTE — Assessment & Plan Note (Signed)
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  Hot flashes moderate in severity.   Return to clinic in 12 months for follow-up

## 2018-08-27 NOTE — Progress Notes (Signed)
Patient Care Team: [name], MD as PCP - General (Family Medicine)  DIAGNOSIS:    ICD-10-CM   1. Malignant neoplasm of upper-inner quadrant of left breast in female, estrogen receptor positive ([place]) C50.212 MM DIAG BREAST TOMO BILATERAL   Z17.0     SUMMARY OF ONCOLOGIC HISTORY:   History of right breast cancer   05/24/2008 Surgery     Right breast lumpectomy revealing 0.9 cm grade 1 invasive ductal carcinoma with negative margins, ER positive, PR positive, HER-2 negative    06/29/2008 - 08/06/2008 Radiation Therapy     adjuvant radiation therapy    10/11/2008 - 10/08/2013 Anti-estrogen oral therapy     antiestrogen therapy with Femara then switched to tamoxifen due to osteoporosis completed 5 years of tamoxifen , patient declined extended adjuvant therapy.     Malignant neoplasm of upper-inner quadrant of left breast in female, estrogen receptor positive ([place])   09/18/2017 Initial Diagnosis    Left breast biopsy 11 o'clock position: IDC grade 2, 0.4 cm by mammogram.  The biopsy had 0.5 cm of material.  T1 a N0 stage I a    11/19/2017 Surgery    Left lumpectomy: IDC grade 1, 0.6 cm, low-grade DCIS, margins negative, T1 be N0 stage I a, ER 100%, PR 70%, HER-2 negative, Ki-67 2%    11/28/2017 -  Anti-estrogen oral therapy    Tamoxifen 20 mg daily, patient refused radiation     CHIEF COMPLIANT: Follow-up on Tamoxifen therapy  INTERVAL HISTORY: Shannon Jensen is a 84 y.o. with above-mentioned history of left breast cancer who underwent a lumpectomy and is currently on tamoxifen. She presents to the clinic today with her son and daughter. She notes she has occasional hot flashes and sharp pains in her breasts. She has mild, intermittent fatigue. She expressed concern about blood clots from Tamoxifen. She recently had cataract surgery in both eyes. She reviewed her medication list with me and asked for paper copies of her medical records and for a referral to genetic testing. Her  son asked about the effectiveness of topical scar creams.   REVIEW OF SYSTEMS:   Constitutional: Denies fevers, chills or abnormal weight loss (+) occasional hot flashes (+) mild, intermittent fatigue Eyes: Denies blurriness of vision Ears, nose, mouth, throat, and face: Denies mucositis or sore throat Respiratory: Denies cough, dyspnea or wheezes Cardiovascular: Denies palpitation, chest discomfort Gastrointestinal:  Denies nausea, heartburn or change in bowel habits Skin: Denies abnormal skin rashes Lymphatics: Denies new lymphadenopathy or easy bruising Neurological:Denies numbness, tingling or new weaknesses Behavioral/Psych: Mood is stable, no new changes  Extremities: No lower extremity edema Breast: denies any lumps or nodules in either breasts (+) occasional sharp breast pain All other systems were reviewed with the patient and are negative.  I have reviewed the past medical history, past surgical history, social history and family history with the patient and they are unchanged from previous note.  ALLERGIES:  is allergic to celecoxib; erythromycin; iodine; nabumetone; nsaids; and vioxx [rofecoxib].  MEDICATIONS:  Current Outpatient Medications  Medication Sig Dispense Refill  . acetaminophen (TYLENOL) 325 MG tablet Take 650 mg by mouth every 6 (six) hours as needed for moderate pain or headache.     Marland Kitchen amLODipine-olmesartan (AZOR) 5-40 MG tablet Take 1 tablet by mouth daily. 90 tablet 3  . Artificial Tear Ointment (DRY EYES OP) Place 1-2 drops into both eyes 2 (two) times daily as needed (for dry eyes).    . Cholecalciferol (VITAMIN D) 2000  units CAPS Take 2,000 Units by mouth daily.    . Multiple Vitamins-Minerals (WOMENS ONE DAILY) TABS Take 1 tablet by mouth daily.      . Omega-3 Fatty Acids (FISH OIL) 1200 MG CAPS Take 1,200 mg by mouth daily.     . tamoxifen (NOLVADEX) 10 MG tablet Take 1 tablet (10 mg total) by mouth daily. 90 tablet 3   No current facility-administered  medications for this visit.     PHYSICAL EXAMINATION: ECOG PERFORMANCE STATUS: 1 - Symptomatic but completely ambulatory  Vitals:   08/28/18 1144  BP: (!) 147/71  Pulse: 95  Resp: 16  Temp: (!) 97.5 F (36.4 C)  SpO2: 100%   Filed Weights   08/28/18 1144  Weight: 163 lb 1.6 oz (74 kg)    GENERAL:alert, no distress and comfortable SKIN: skin color, texture, turgor are normal, no rashes or significant lesions EYES: normal, Conjunctiva are pink and non-injected, sclera clear OROPHARYNX:no exudate, no erythema and lips, buccal mucosa, and tongue normal  NECK: supple, thyroid normal size, non-tender, without nodularity LYMPH:  no palpable lymphadenopathy in the cervical, axillary or inguinal LUNGS: clear to auscultation and percussion with normal breathing effort HEART: regular rate & rhythm and no murmurs and no lower extremity edema ABDOMEN:abdomen soft, non-tender and normal bowel sounds MUSCULOSKELETAL:no cyanosis of digits and no clubbing  NEURO: alert & oriented x 3 with fluent speech, no focal motor/sensory deficits EXTREMITIES: No lower extremity edema BREAST: No palpable masses or nodules in either right or left breasts.  Scar tissue in the right breast from prior surgery.  No palpable axillary supraclavicular or infraclavicular adenopathy no breast tenderness or nipple discharge. (exam performed in the presence of a chaperone)  LABORATORY DATA:  I have reviewed the data as listed CMP Latest Ref Rng & Units 11/12/2017 08/27/2016 11/07/2015  Glucose 65 - 99 mg/dL 96 89 85  BUN 6 - 20 mg/dL 13 12 9.6  Creatinine 0.44 - 1.00 mg/dL 0.88 0.83 0.9  Sodium 135 - 145 mmol/L 139 138 139  Potassium 3.5 - 5.1 mmol/L 4.0 4.0 3.9  Chloride 101 - 111 mmol/L 104 105 -  CO2 22 - 32 mmol/L '26 25 24  ' Calcium 8.9 - 10.3 mg/dL 10.2 9.5 9.7  Total Protein 6.1 - 8.1 g/dL - 7.0 7.3  Total Bilirubin 0.2 - 1.2 mg/dL - 0.7 0.98  Alkaline Phos 33 - 130 U/L - 65 81  AST 10 - 35 U/L - 20 20  ALT  6 - 29 U/L - 13 12    Lab Results  Component Value Date   WBC 8.2 11/12/2017   HGB 13.5 11/12/2017   HCT 40.8 11/12/2017   MCV 92.1 11/12/2017   PLT 215 11/12/2017   NEUTROABS 4,408 08/27/2016    ASSESSMENT & PLAN:  Malignant neoplasm of upper-inner quadrant of left breast in female, estrogen receptor positive ([place]) 09/18/2017:Left breast biopsy 11 o'clock position: IDC grade 23/08/2018:Left lumpectomy: IDC grade 1, 0.6 cm, low-grade DCIS, margins negative, T1 be N0 stage I a, ER 100%, PR 70%, HER-2 negative, Ki-67 2%  (History of right breast cancer 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  Hot flashes mild to  moderate in severity.  I will set her up for a mammogram in February. Because she has several family members with prostate cancer, I recommended genetic testing.  Return to clinic in  12 months for follow-up    Orders Placed This Encounter  Procedures  . MM DIAG BREAST TOMO BILATERAL    Standing Status:   Future    Standing Expiration Date:   08/29/2019    Order Specific Question:   Reason for Exam (SYMPTOM  OR DIAGNOSIS REQUIRED)    Answer:   Annual Mammograms with H/o breast cancer    Order Specific Question:   Preferred imaging location?    Answer:   [facility]   The patient has a good understanding of the overall plan. she agrees with it. she will call with any problems that may develop before the next visit here.  Nicholas Lose, MD 08/28/2018   I, Cloyde Reams Dorshimer, am acting as scribe for Nicholas Lose, MD.  I have reviewed the above documentation for accuracy and completeness,  and I agree with the above.

## 2018-08-28 ENCOUNTER — Inpatient Hospital Stay: Payer: Medicare Other | Attending: Hematology and Oncology | Admitting: Hematology and Oncology

## 2018-08-28 ENCOUNTER — Telehealth: Payer: Self-pay | Admitting: Hematology and Oncology

## 2018-08-28 DIAGNOSIS — N951 Menopausal and female climacteric states: Secondary | ICD-10-CM | POA: Diagnosis not present

## 2018-08-28 DIAGNOSIS — Z170 Estrogen receptor positive status [ER+]: Secondary | ICD-10-CM | POA: Diagnosis not present

## 2018-08-28 DIAGNOSIS — Z853 Personal history of malignant neoplasm of breast: Secondary | ICD-10-CM | POA: Diagnosis not present

## 2018-08-28 DIAGNOSIS — C50212 Malignant neoplasm of upper-inner quadrant of left female breast: Secondary | ICD-10-CM | POA: Diagnosis not present

## 2018-08-28 DIAGNOSIS — R5383 Other fatigue: Secondary | ICD-10-CM

## 2018-08-28 DIAGNOSIS — Z8042 Family history of malignant neoplasm of prostate: Secondary | ICD-10-CM | POA: Diagnosis not present

## 2018-08-28 DIAGNOSIS — N644 Mastodynia: Secondary | ICD-10-CM | POA: Insufficient documentation

## 2018-08-28 NOTE — Telephone Encounter (Signed)
Gave avs and calendar ° °

## 2018-09-24 ENCOUNTER — Inpatient Hospital Stay: Payer: Medicare Other

## 2018-09-24 ENCOUNTER — Encounter: Payer: Self-pay | Admitting: Genetic Counselor

## 2018-09-24 ENCOUNTER — Inpatient Hospital Stay: Payer: Medicare Other | Attending: Hematology and Oncology | Admitting: Genetic Counselor

## 2018-09-24 DIAGNOSIS — Z8042 Family history of malignant neoplasm of prostate: Secondary | ICD-10-CM | POA: Insufficient documentation

## 2018-09-24 DIAGNOSIS — Z800 Family history of malignant neoplasm of digestive organs: Secondary | ICD-10-CM | POA: Diagnosis not present

## 2018-09-24 DIAGNOSIS — C50912 Malignant neoplasm of unspecified site of left female breast: Secondary | ICD-10-CM

## 2018-09-24 DIAGNOSIS — Z170 Estrogen receptor positive status [ER+]: Secondary | ICD-10-CM

## 2018-09-24 DIAGNOSIS — C50212 Malignant neoplasm of upper-inner quadrant of left female breast: Secondary | ICD-10-CM | POA: Diagnosis not present

## 2018-09-24 DIAGNOSIS — Z803 Family history of malignant neoplasm of breast: Secondary | ICD-10-CM | POA: Diagnosis not present

## 2018-09-24 NOTE — Progress Notes (Signed)
REFERRING PROVIDER: Nicholas Lose, MD [address] [place], [place] [address]  PRIMARY PROVIDER:  [name], MD  PRIMARY REASON FOR VISIT:  1. Malignant neoplasm of upper-inner quadrant of left breast in female, estrogen receptor positive ([place])   2. Family history of breast cancer   3. Family history of pancreatic cancer   4. Family history of prostate cancer   5. Invasive ductal carcinoma of left breast ([place])   6. Family history of colon cancer      HISTORY OF PRESENT ILLNESS:   Ms. Ho, a 85 y.o. female, was seen for a [place] cancer genetics consultation at the request of Dr. Lindi Adie due to a personal and family history of cancer.  Ms. Chay presents to clinic today to discuss the possibility of a hereditary predisposition to cancer, genetic testing, and to further clarify her future cancer risks, as well as potential cancer risks for family members.   In 2008 , at the age of 45, Ms. Happ was diagnosed with invasive ductal carcinoma of the right breast.  The tumor was ER+/PR+/Her2-.  This was treated with lumpectomy, chemotherapy and radiation.  In 2019, at the age of 37, Ms. Dahle was diagnosed with cancer in the left breast.     CANCER HISTORY:    History of right breast cancer   05/24/2008 Surgery     Right breast lumpectomy revealing 0.9 cm grade 1 invasive ductal carcinoma with negative margins, ER positive, PR positive, HER-2 negative    06/29/2008 - 08/06/2008 Radiation Therapy     adjuvant radiation therapy    10/11/2008 - 10/08/2013 Anti-estrogen oral therapy     antiestrogen therapy with Femara then switched to tamoxifen due to osteoporosis completed 5 years of tamoxifen , patient declined extended adjuvant therapy.     Malignant neoplasm of upper-inner quadrant of left breast in female, estrogen receptor positive ([place])   09/18/2017 Initial Diagnosis    Left breast biopsy 11 o'clock position: IDC grade 2, 0.4 cm by mammogram.  The biopsy had 0.5 cm of  material.  T1 a N0 stage I a    11/19/2017 Surgery    Left lumpectomy: IDC grade 1, 0.6 cm, low-grade DCIS, margins negative, T1 be N0 stage I a, ER 100%, PR 70%, HER-2 negative, Ki-67 2%    11/28/2017 -  Anti-estrogen oral therapy    Tamoxifen 20 mg daily, patient refused radiation      HORMONAL RISK FACTORS:  Menarche was at age 51.  First live birth at age 89.  OCP use for approximately 5 years.  Ovaries intact: no.  Hysterectomy: yes.  Menopausal status: postmenopausal.  HRT use: 0 years. Colonoscopy: yes; normal. Mammogram within the last year: yes. Number of breast biopsies: 2. Up to date with pelvic exams:  yes. Any excessive radiation exposure in the past:  no  Past Medical History:  Diagnosis Date  . Anxiety and depression   . Arthritis   . Breast cancer [facility]) 12/ 2009   right breast  . Chronic headaches   . Cystocele   . Diverticulosis   . Family history of breast cancer   . Family history of colon cancer   . Family history of pancreatic cancer   . Family history of prostate cancer   . GERD (gastroesophageal reflux disease)   . H/O vitamin D deficiency   . HLD (hyperlipidemia)   . Hypertension   . Osteoarthritis   . Osteoporosis   . Pelvic relaxation   . Urinary  tract infection 2014    Past Surgical History:  Procedure Laterality Date  . ABDOMINAL HYSTERECTOMY  1998  . APPENDECTOMY  1970  . BREAST LUMPECTOMY Right    malignant  . BREAST LUMPECTOMY WITH RADIOACTIVE SEED LOCALIZATION Left 11/19/2017   Procedure: BREAST LUMPECTOMY WITH RADIOACTIVE SEED LOCALIZATION;  Surgeon: Rolm Bookbinder, MD;  Location: [facility];  Service: General;  Laterality: Left;  . BREAST SURGERY  2009   cancer-lymph node removal  . NECK SURGERY     cyst removal    Social History   Socioeconomic History  . Marital status: Widowed    Spouse name: Not on file  . Number of children: 4  . Years of education: Not on file  . Highest education level: Not on file   Occupational History  . Occupation: retired    Fish farm manager: RETIRED  Social Needs  . Financial resource strain: Not on file  . Food insecurity:    Worry: Not on file    Inability: Not on file  . Transportation needs:    Medical: Not on file    Non-medical: Not on file  Tobacco Use  . Smoking status: Never Smoker  . Smokeless tobacco: Never Used  Substance and Sexual Activity  . Alcohol use: No  . Drug use: No  . Sexual activity: Never  Lifestyle  . Physical activity:    Days per week: Not on file    Minutes per session: Not on file  . Stress: Not on file  Relationships  . Social connections:    Talks on phone: Not on file    Gets together: Not on file    Attends religious service: Not on file    Active member of club or organization: Not on file    Attends meetings of clubs or organizations: Not on file    Relationship status: Not on file  Other Topics Concern  . Not on file  Social History Narrative  . Not on file     FAMILY HISTORY:  We obtained a detailed, 4-generation family history.  Significant diagnoses are listed below: Family History  Problem Relation Age of Onset  . Prostate cancer Father 61       d. 57  . Heart disease Paternal Uncle   . Colon cancer Mother        dx 5s-60s  . Breast cancer Sister        dx >50  . Pancreatic cancer Brother   . Prostate cancer Brother   . Colon polyps Daughter        x 2, son  . Stroke Daughter        x 2  . Appendicitis Daughter        appendectomy  . Pancreatic cancer Sister 73       d. 60  . Prostate cancer Nephew        dx.50s    The patient has three daughters and one son who are all cancer free.  She 10 siblings.  One brother had both prostate and pancreatic cancer and his son had prostate cancer.  One sister had breast cancer in her 28's and one sister died of pancreatic cancer at 85.  Both parents are deceased.  The patient's mother had colon cancer in her 39's-60's.  She had six siblings who were  cancer free.  Her parents died of natural causes.  The patient's father had prostate cancer.  He had 10 siblings, none reported to have cancer.  His parents  died of heart disease and old age.  Ms. Marksberry is unaware of previous family history of genetic testing for hereditary cancer risks. Patient's maternal ancestors are of African American descent, and paternal ancestors are of African American descent. There is no reported Ashkenazi Jewish ancestry. There is no known consanguinity.  GENETIC COUNSELING ASSESSMENT: SAN RUA is a 85 y.o. female with a personal and family history of cancer which is somewhat suggestive of a hereditary cancer syndrome and predisposition to cancer. We, therefore, discussed and recommended the following at today's visit.   DISCUSSION: We discussed that about 5-10% of breast cancer is hereditary with most cases due to BRCA mutations.  The patient's family has several cancers that are associated with BRCA mutations, including prostate, pancreatic and breast cancer.  None were diagnosed at early ages.  Based on the age of onset of cancer in the family, the chances that this is due to a hereditary cancer syndrome is lower.    We reviewed the characteristics, features and inheritance patterns of hereditary cancer syndromes. We also discussed genetic testing, including the appropriate family members to test, the process of testing, insurance coverage and turn-around-time for results. We discussed the implications of a negative, positive and/or variant of uncertain significant result. We recommended Ms. Duford pursue genetic testing for the common hereditary cancer gene panel. The Hereditary Gene Panel offered by [facility] includes sequencing and/or deletion duplication testing of the following 47 genes: APC, ATM, AXIN2, BARD1, BMPR1A, BRCA1, BRCA2, BRIP1, CDH1, CDK4, CDKN2A (p14ARF), CDKN2A (p16INK4a), CHEK2, CTNNA1, DICER1, EPCAM (Deletion/duplication testing only), GREM1 (promoter  region deletion/duplication testing only), KIT, MEN1, MLH1, MSH2, MSH3, MSH6, MUTYH, NBN, NF1, NHTL1, PALB2, PDGFRA, PMS2, POLD1, POLE, PTEN, RAD50, RAD51C, RAD51D, SDHB, SDHC, SDHD, SMAD4, SMARCA4. STK11, TP53, TSC1, TSC2, and VHL.  The following genes were evaluated for sequence changes only: SDHA and HOXB13 c.251G>A variant only.   Based on Ms. Mcnicholas's personal and family history of cancer, she meets medical criteria for genetic testing. Despite that she meets criteria, she may still have an out of pocket cost. We discussed that if her out of pocket cost for testing is over $100, the laboratory will call and confirm whether she wants to proceed with testing.  If the out of pocket cost of testing is less than $100 she will be billed by the genetic testing laboratory.   PLAN: After considering the risks, benefits, and limitations, Ms. Hamler  provided informed consent to pursue genetic testing and the blood sample was sent to [facility] for analysis of the common hereditary cancer panel. Results should be available within approximately 2-3 weeks' time, at which point they will be disclosed by telephone to Ms. Hollenbach, as will any additional recommendations warranted by these results. Ms. Mccollister will receive a summary of her genetic counseling visit and a copy of her results once available. This information will also be available in Epic. We encouraged Ms. Yoo to remain in contact with cancer genetics annually so that we can continuously update the family history and inform her of any changes in cancer genetics and testing that may be of benefit for her family. Ms. Lecrone questions were answered to her satisfaction today. Our contact information was provided should additional questions or concerns arise.  Lastly, we encouraged Ms. Simek to remain in contact with cancer genetics annually so that we can continuously update the family history and inform her of any changes in cancer genetics and testing that may  be of benefit for this  family.   Ms.  Kopplin questions were answered to her satisfaction today. Our contact information was provided should additional questions or concerns arise. Thank you for the referral and allowing [place] to share in the care of your patient.   Sheetal Lyall P. Florene Glen, [place], [facility] Certified Genetic Counselor [email or web address] phone: [phone]  The patient was seen for a total of 45 minutes in face-to-face genetic counseling.  This patient was discussed with Drs. Magrinat, Lindi Adie and/or Burr Medico who agrees with the above.    _______________________________________________________________________ For Office Staff:  Number of people involved in session: 2 Was an Intern/ student involved with case: no

## 2018-10-03 ENCOUNTER — Telehealth: Payer: Self-pay | Admitting: Genetic Counselor

## 2018-10-03 ENCOUNTER — Encounter: Payer: Self-pay | Admitting: Genetic Counselor

## 2018-10-03 DIAGNOSIS — Z1379 Encounter for other screening for genetic and chromosomal anomalies: Secondary | ICD-10-CM | POA: Insufficient documentation

## 2018-10-03 NOTE — Telephone Encounter (Signed)
Could not leave a message as the VM was full.

## 2018-10-08 NOTE — Telephone Encounter (Signed)
Ms. Mosso VM was full and I could not leave a message.   Called her daughter, Ivin Booty, and LM on her VM that results were back, I could not reach her mother, so I was reaching out to her.  Left CB instructions.

## 2018-10-15 ENCOUNTER — Ambulatory Visit (HOSPITAL_BASED_OUTPATIENT_CLINIC_OR_DEPARTMENT_OTHER): Payer: Medicare Other | Admitting: Genetic Counselor

## 2018-10-15 DIAGNOSIS — Z1379 Encounter for other screening for genetic and chromosomal anomalies: Secondary | ICD-10-CM

## 2018-10-15 DIAGNOSIS — Z8042 Family history of malignant neoplasm of prostate: Secondary | ICD-10-CM

## 2018-10-15 DIAGNOSIS — Z170 Estrogen receptor positive status [ER+]: Secondary | ICD-10-CM

## 2018-10-15 DIAGNOSIS — C50212 Malignant neoplasm of upper-inner quadrant of left female breast: Secondary | ICD-10-CM

## 2018-10-15 DIAGNOSIS — Z803 Family history of malignant neoplasm of breast: Secondary | ICD-10-CM

## 2018-10-15 DIAGNOSIS — Z800 Family history of malignant neoplasm of digestive organs: Secondary | ICD-10-CM

## 2018-10-15 NOTE — Progress Notes (Addendum)
HPI:  Shannon Jensen was previously seen in the [place] clinic due to a personal and family history of cancer and concerns regarding a hereditary predisposition to cancer. Please refer to our prior cancer genetics clinic note for more information regarding Shannon Jensen's medical, social and family histories, and our assessment and recommendations, at the time. Shannon Jensen recent genetic test results were disclosed to her and her son, Shannon Jensen, today in clinic as were recommendations warranted by these results. These results and recommendations are discussed in more detail below.  CANCER HISTORY:    History of right breast cancer   05/24/2008 Surgery     Right breast lumpectomy revealing 0.9 cm grade 1 invasive ductal carcinoma with negative margins, ER positive, PR positive, HER-2 negative    06/29/2008 - 08/06/2008 Radiation Therapy     adjuvant radiation therapy    10/11/2008 - 10/08/2013 Anti-estrogen oral therapy     antiestrogen therapy with Femara then switched to tamoxifen due to osteoporosis completed 5 years of tamoxifen , patient declined extended adjuvant therapy.     Malignant neoplasm of upper-inner quadrant of left breast in female, estrogen receptor positive ([place])   09/18/2017 Initial Diagnosis    Left breast biopsy 11 o'clock position: IDC grade 2, 0.4 cm by mammogram.  The biopsy had 0.5 cm of material.  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  The Hereditary Gene Panel offered by [facility] includes sequencing and/or deletion duplication testing of the following 47 genes: APC, ATM, AXIN2, BARD1, BMPR1A, BRCA1, BRCA2, BRIP1, CDH1, CDK4,  CDKN2A (p14ARF), CDKN2A (p16INK4a), CHEK2, CTNNA1, DICER1, EPCAM (Deletion/duplication testing only), GREM1 (promoter region deletion/duplication testing only), KIT, MEN1, MLH1, MSH2, MSH3, MSH6, MUTYH, NBN, NF1, NHTL1, PALB2, PDGFRA, PMS2, POLD1, POLE, PTEN, RAD50, RAD51C, RAD51D, SDHB, SDHC, SDHD, SMAD4, SMARCA4. STK11, TP53, TSC1, TSC2, and VHL.  The following genes were evaluated for sequence changes only: SDHA and HOXB13 c.251G>A variant only. The report date is October 02, 2018.     FAMILY HISTORY:  We obtained a detailed, 4-generation family history.  Significant diagnoses are listed below: Family History  Problem Relation Age of Onset   Prostate cancer Father 64       d. 48   Heart disease Paternal Uncle    Colon cancer Mother        dx 19s-60s   Breast cancer Sister        dx >50   Pancreatic cancer Brother    Prostate cancer Brother    Colon polyps Daughter        x 2, son   Stroke Daughter        x 2   Appendicitis Daughter        appendectomy   Pancreatic cancer Sister 45       d. 73   Prostate cancer Nephew        dx.50s    The patient has three daughters and one son who are all cancer free.  She 10 siblings.  One brother had both prostate and pancreatic cancer and his son had prostate cancer.  One sister had breast cancer in her 28's and one sister died of pancreatic cancer at 63.  Both parents are deceased.   The patient's mother had colon cancer in her 58's-60's.  She had six siblings who were cancer free.  Her parents died of natural causes.   The patient's father had prostate cancer.  He had 10 siblings, none reported to have cancer.  His parents died of heart disease and old age.   Shannon Jensen is unaware of previous family history of genetic testing for hereditary cancer risks. Patient's maternal ancestors are of African American descent, and paternal ancestors are of African American descent. There is no reported Ashkenazi Jewish ancestry. There is no known  consanguinity.   GENETIC TEST RESULTS: Genetic testing reported out on October 02, 2018 through the common hereditary cancer panel found no deleterious mutations.  The Hereditary Gene Panel offered by [facility] includes sequencing and/or deletion duplication testing of the following 47 genes: APC, ATM, AXIN2, BARD1, BMPR1A, BRCA1, BRCA2, BRIP1, CDH1, CDK4, CDKN2A (p14ARF), CDKN2A (p16INK4a), CHEK2, CTNNA1, DICER1, EPCAM (Deletion/duplication testing only), GREM1 (promoter region deletion/duplication testing only), KIT, MEN1, MLH1, MSH2, MSH3, MSH6, MUTYH, NBN, NF1, NHTL1, PALB2, PDGFRA, PMS2, POLD1, POLE, PTEN, RAD50, RAD51C, RAD51D, SDHB, SDHC, SDHD, SMAD4, SMARCA4. STK11, TP53, TSC1, TSC2, and VHL.  The following genes were evaluated for sequence changes only: SDHA and HOXB13 c.251G>A variant only.  The test report has been scanned into EPIC and is located under the Molecular Pathology section of the Results Review tab.    We discussed with Shannon Jensen that since the current genetic testing is not perfect, it is possible there may be a gene mutation in one of these genes that current testing cannot detect, but that chance is small.  We also discussed, that it is possible that another gene that has not yet been discovered, or that we have not yet tested, is responsible for the cancer diagnoses in the family, and it is, therefore, important to remain in touch with cancer genetics in the future so that we can continue to offer Shannon Jensen the most up to date genetic testing.   Genetic testing did detect a Variant of Unknown Significance in the MSH3 gene called c.2229A>G. At this time, it is unknown if this variant is associated with increased cancer risk or if this is a normal finding, but most variants such as this get reclassified to being inconsequential. It should not be used to make medical management decisions. With time, we suspect the lab will determine the significance of this variant, if any. If we do learn  more about it, we will try to contact Shannon Jensen to discuss it further. However, it is important to stay in touch with [place] periodically and keep the address and phone number up to date.   We reviewed that MSH3 is a polyposis colon cancer gene, that is inherited in a recessive fashion. Should it be determined in the future to be pathogenic, we would not feel that Shannon Jensen would be at risk for cancer.  However, other family members may want to get tested.    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  The amended report date is March 20, 2021.  CANCER SCREENING RECOMMENDATIONS: This result is reassuring and indicates that Shannon Jensen likely does not have an increased risk for a future cancer due to a mutation in one of these genes. This normal test also suggests that Shannon Jensen's cancer was most likely  not due to an inherited predisposition associated with one of these genes.  Most cancers happen by chance and this negative test suggests that her cancer falls into this category.  We, therefore, recommended she continue to follow the cancer management and screening guidelines provided by her oncology and primary healthcare provider.   An individuals cancer risk and medical management are not determined by genetic test results alone. Overall cancer risk assessment incorporates additional factors, including personal medical history, family history, and any available genetic information that may result in a personalized plan for cancer prevention and surveillance.  RECOMMENDATIONS FOR FAMILY MEMBERS:  Women in this family might be at some increased risk of developing cancer, over the general population risk, simply due to the family history of cancer.  We recommended women in this family have a yearly mammogram beginning at age 56, or 63 years younger than the earliest onset of cancer, an annual clinical breast exam, and perform monthly breast self-exams. Women in this family should also  have a gynecological exam as recommended by their primary provider. All family members should have a colonoscopy by age 63.  FOLLOW-UP: Lastly, we discussed with Shannon Jensen that cancer genetics is a rapidly advancing field and it is possible that new genetic tests will be appropriate for her and/or her family members in the future. We encouraged her to remain in contact with cancer genetics on an annual basis so we can update her personal and family histories and let her know of advances in cancer genetics that may benefit this family.   Our contact number was provided. Shannon Jensen questions were answered to her satisfaction, and she knows she is welcome to call us at anytime with additional questions or concerns.   Roma Kayser, MS, [facility] Certified Genetic Counselor [email or web address]

## 2018-11-24 ENCOUNTER — Other Ambulatory Visit: Payer: Self-pay

## 2018-11-24 ENCOUNTER — Other Ambulatory Visit (INDEPENDENT_AMBULATORY_CARE_PROVIDER_SITE_OTHER): Payer: Medicare Other

## 2018-11-24 DIAGNOSIS — Z23 Encounter for immunization: Secondary | ICD-10-CM | POA: Diagnosis not present

## 2019-03-06 ENCOUNTER — Other Ambulatory Visit: Payer: Self-pay | Admitting: Hematology and Oncology

## 2019-05-08 ENCOUNTER — Other Ambulatory Visit: Payer: Self-pay | Admitting: General Surgery

## 2019-05-08 DIAGNOSIS — N6310 Unspecified lump in the right breast, unspecified quadrant: Secondary | ICD-10-CM

## 2019-05-21 ENCOUNTER — Other Ambulatory Visit: Payer: Self-pay

## 2019-05-21 ENCOUNTER — Other Ambulatory Visit: Payer: Self-pay | Admitting: Hematology and Oncology

## 2019-05-21 ENCOUNTER — Ambulatory Visit
Admission: RE | Admit: 2019-05-21 | Discharge: 2019-05-21 | Disposition: A | Discharge status: Home / Self Care | Payer: Medicare Other | Source: Ambulatory Visit | Attending: Hematology and Oncology | Admitting: Hematology and Oncology

## 2019-05-21 ENCOUNTER — Ambulatory Visit: Payer: Medicare Other

## 2019-05-21 DIAGNOSIS — C50212 Malignant neoplasm of upper-inner quadrant of left female breast: Secondary | ICD-10-CM

## 2019-05-21 DIAGNOSIS — Z170 Estrogen receptor positive status [ER+]: Secondary | ICD-10-CM

## 2019-06-02 ENCOUNTER — Telehealth: Payer: Self-pay | Admitting: Family Medicine

## 2019-06-02 NOTE — Telephone Encounter (Signed)
Considering her age, it might be wise for her to avoid any large gatherings of people.

## 2019-06-02 NOTE — Telephone Encounter (Signed)
Pt was advised Shannon Jensen 

## 2019-06-02 NOTE — Telephone Encounter (Signed)
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  She works from 3 days to 2 weeks. [phone]

## 2019-06-23 DIAGNOSIS — E049 Nontoxic goiter, unspecified: Secondary | ICD-10-CM | POA: Insufficient documentation

## 2019-06-26 ENCOUNTER — Other Ambulatory Visit: Payer: Self-pay | Admitting: Otolaryngology

## 2019-06-26 DIAGNOSIS — E049 Nontoxic goiter, unspecified: Secondary | ICD-10-CM

## 2019-06-29 ENCOUNTER — Telehealth: Payer: Self-pay | Admitting: Hematology and Oncology

## 2019-06-29 NOTE — Telephone Encounter (Signed)
Returned call and rescheduled 12/21 f/u to 12/9. Confirmed new appointment with patient.

## 2019-07-06 ENCOUNTER — Other Ambulatory Visit: Payer: Self-pay | Admitting: Family Medicine

## 2019-07-06 DIAGNOSIS — I10 Essential (primary) hypertension: Secondary | ICD-10-CM

## 2019-07-09 ENCOUNTER — Other Ambulatory Visit: Payer: Self-pay

## 2019-07-09 ENCOUNTER — Encounter: Payer: Self-pay | Admitting: Family Medicine

## 2019-07-09 ENCOUNTER — Ambulatory Visit (INDEPENDENT_AMBULATORY_CARE_PROVIDER_SITE_OTHER): Payer: Medicare Other | Admitting: Family Medicine

## 2019-07-09 ENCOUNTER — Ambulatory Visit
Admission: RE | Admit: 2019-07-09 | Discharge: 2019-07-09 | Disposition: A | Discharge status: Home / Self Care | Payer: Medicare Other | Source: Ambulatory Visit | Attending: Otolaryngology | Admitting: Otolaryngology

## 2019-07-09 VITALS — BP 126/72 | HR 87 | Temp 97.3°F | Ht 64.0 in | Wt 154.8 lb

## 2019-07-09 DIAGNOSIS — C50212 Malignant neoplasm of upper-inner quadrant of left female breast: Secondary | ICD-10-CM

## 2019-07-09 DIAGNOSIS — I10 Essential (primary) hypertension: Secondary | ICD-10-CM

## 2019-07-09 DIAGNOSIS — Z8639 Personal history of other endocrine, nutritional and metabolic disease: Secondary | ICD-10-CM

## 2019-07-09 DIAGNOSIS — M810 Age-related osteoporosis without current pathological fracture: Secondary | ICD-10-CM

## 2019-07-09 DIAGNOSIS — E049 Nontoxic goiter, unspecified: Secondary | ICD-10-CM

## 2019-07-09 DIAGNOSIS — M1990 Unspecified osteoarthritis, unspecified site: Secondary | ICD-10-CM

## 2019-07-09 DIAGNOSIS — Z170 Estrogen receptor positive status [ER+]: Secondary | ICD-10-CM

## 2019-07-09 DIAGNOSIS — Z23 Encounter for immunization: Secondary | ICD-10-CM

## 2019-07-09 DIAGNOSIS — E785 Hyperlipidemia, unspecified: Secondary | ICD-10-CM | POA: Diagnosis not present

## 2019-07-09 DIAGNOSIS — H9113 Presbycusis, bilateral: Secondary | ICD-10-CM

## 2019-07-09 DIAGNOSIS — Z0000 Encounter for general adult medical examination without abnormal findings: Secondary | ICD-10-CM

## 2019-07-09 DIAGNOSIS — K219 Gastro-esophageal reflux disease without esophagitis: Secondary | ICD-10-CM | POA: Diagnosis not present

## 2019-07-09 MED ORDER — AMLODIPINE-OLMESARTAN 5-40 MG PO TABS: 1.0000 | ORAL_TABLET | Freq: Every day | ORAL | 3 refills | Status: DC

## 2019-07-09 NOTE — Progress Notes (Signed)
Shannon Jensen is a 85 y.o. female who presents for annual wellness visit,CPE and follow-up on chronic medical conditions.  She does have a history of breast cancer and presently is on Nolvadex.  She also has a history of osteoporosis but states that she has never been treated for that.  She is taking Azor for her hypertension.  Also has a history of vitamin D deficiency and does do supplemental vitamin D.  She is having no difficulty with her reflux.  She has seen Dr. Constance Holster who noted an enlarged thyroid and is following up on this at the present time.  She also has a history of presbycusis but presently is not using hearing aids.  She also has a history of hyperlipidemia but is not on medication.  She does have arthritis but is not taking any medications at the present time.  She has no complaints of indigestion or trouble swallowing.  She does have difficulty with nasal congestion and rhinorrhea but not necessarily associated with spring and fall allergies.  Immunizations and Health Maintenance Immunization History  Administered Date(s) Administered  . Influenza Split 06/13/2012, 06/30/2015  . Influenza Whole 08/10/2009, 07/25/2010  . Influenza, High Dose Seasonal PF 08/15/2016, 05/29/2017, 11/24/2018  . Pneumococcal Conjugate-13 05/18/2014  . Pneumococcal Polysaccharide-23 05/28/2007  . Zoster Recombinat (Shingrix) 08/28/2017   Health Maintenance Due  Topic Date Due  . TETANUS/TDAP  08/20/1947  . PAP SMEAR-Modifier  05/18/2016  . INFLUENZA VACCINE  04/11/2019    Last Pap smear:N/A Last mammogram: Followed by oncology Last colonoscopy:N/ALast DEXA: Dentist: Ophtho: Exercise:none  Other doctors caring for patient include:Gud[place]    Rosen  Advanced directives:none    Depression screen:  See questionnaire below.  Depression screen [facility] 2/9 01/14/2018 09/30/2015 08/09/2015 11/19/2013 06/13/2012  Decreased Interest 0 0 0 0 1  Down, Depressed, Hopeless 0 0 0 0 1  PHQ - 2 Score 0 0 0 0 2     Fall Risk Screen: see questionnaire below. Fall Risk  01/14/2018 09/30/2015 08/09/2015 11/19/2013  Falls in the past year? No No No No    ADL screen:  See questionnaire below Functional Status Survey:     Review of Systems Constitutional: -, -unexpected weight change, -anorexia, -fatigue Allergy: -sneezing, -itching, -congestion Dermatology: denies changing moles, rash, lumps ENT: -runny nose, -ear pain, -sore throat,  Cardiology:  -chest pain, -palpitations, -orthopnea, Respiratory: -cough, -shortness of breath, -dyspnea on exertion, -wheezing,  Gastroenterology: -abdominal pain, -nausea, -vomiting, -diarrhea, -constipation, -dysphagia Hematology: -bleeding or bruising problems Musculoskeletal: -arthralgias, -myalgias, -joint swelling, -back pain, - Ophthalmology: -vision changes,  Urology: -dysuria, -difficulty urinating,  -urinary frequency, -urgency, incontinence Neurology: -, -numbness, , -memory loss, -falls, -dizziness    PHYSICAL EXAM:   General Appearance: Alert, cooperative, no distress, appears stated age Head: Normocephalic, without obvious abnormality, atraumatic Eyes: PERRL, conjunctiva/corneas clear, EOM's intact, fundi benign Ears: Normal TM's and external ear canals Nose: Nares normal, mucosa normal, no drainage or sinus tenderness Throat: Lips, mucosa, and tongue normal; teeth and gums normal Neck: Supple, no lymphadenopathy;  thyroid is enlarged no carotid  Lungs: Clear to auscultation bilaterally without wheezes, rales or ronchi; respirations unlabored Heart: Regular rate and rhythm, S1 and S2 normal, no murmur, rubor gallop Abdomen: Soft, non-tender, nondistended, normoactive bowel sounds,  no masses, no hepatosplenomegaly Extremities: No clubbing, cyanosis or edema  Skin:  Skin color, texture, turgor normal, no rashes or lesions Lymph nodes: Cervical, supraclavicular, and axillary nodes normal Neurologic:  CNII-XII intact, normal strength, sensation  and gait; reflexes  2+ and symmetric throughout Psych: Normal mood, affect, hygiene and grooming.  ASSESSMENT/PLAN: Routine general medical examination at a health care facility - Plan: CBC with Differential/Platelet, Comprehensive metabolic panel, Lipid panel  Hyperlipidemia LDL goal <130 - Plan: Lipid panel  Essential hypertension - Plan: CBC with Differential/Platelet, Comprehensive metabolic panel, amLODipine-olmesartan (AZOR) 5-40 MG tablet  H/O vitamin D deficiency - Plan: VITAMIN D 25 Hydroxy (Vit-D Deficiency, Fractures)  Gastroesophageal reflux disease without esophagitis  Malignant neoplasm of upper-inner quadrant of left breast in female, estrogen receptor positive (HCC)  Osteoporosis, unspecified osteoporosis type, unspecified pathological fracture presence - Plan: DG Bone Density: She states that she has never been treated for osteoporosis.  Enlarged thyroid: Being followed by Dr. Constance Holster for this.  Presbycusis of both ears - Plan: Hearing screening, Ambulatory referral to Audiology  Need for influenza vaccination - Plan: Flu Vaccine QUAD High Dose(Fluad)  Osteoarthritis, unspecified osteoarthritis type, unspecified site Recommend she get Tdap and Shingrix at the drugstore.  Recommend Flonase for her nasal congestion and rhinorrhea.    Immunization recommendations discussed.    Medicare Attestation I have personally reviewed: The patient's medical and social history Their use of alcohol, tobacco or illicit drugs Their current medications and supplements The patient's functional ability including ADLs,fall risks, home safety risks, cognitive, and hearing and visual impairment Diet and physical activities Evidence for depression or mood disorders  The patient's weight, height, and BMI have been recorded in the chart.  I have made referrals, counseling, and provided education to the patient based on review of the above and I have provided the patient with a written  personalized care plan for preventive services.     Jill Alexanders, MD   07/09/2019

## 2019-07-09 NOTE — Patient Instructions (Signed)
  Ms. Duffer , Thank you for taking time to come for your Medicare Wellness Visit. I appreciate your ongoing commitment to your health goals. Please review the following plan we discussed and let me know if I can assist you in the future.   These are the goals we discussed: Use Flonase nasal spray to help with your nasal congestion and runny nosehearing This is a list of the screening recommended for you and due dates:  Health Maintenance  Topic Date Due  . Tetanus Vaccine  08/20/1947  . Pap Smear  05/18/2016  . Flu Shot  04/11/2019  . Mammogram  05/20/2020  . DEXA scan (bone density measurement)  Completed  . Pneumonia vaccines  Completed

## 2019-07-10 LAB — LIPID PANEL
Chol/HDL Ratio: 4.5 ratio — ABNORMAL HIGH (ref 0.0–4.4)
Cholesterol, Total: 282 mg/dL — ABNORMAL HIGH (ref 100–199)
HDL: 62 mg/dL (ref >39)
LDL Chol Calc (NIH): 209 mg/dL — ABNORMAL HIGH (ref 0–99)
Triglycerides: 69 mg/dL (ref 0–149)
VLDL Cholesterol Cal: 11 mg/dL (ref 5–40)

## 2019-07-10 LAB — CBC WITH DIFFERENTIAL/PLATELET
Basophils Absolute: 0 10*3/uL (ref 0.0–0.2)
Basos: 0 %
EOS (ABSOLUTE): 0.1 10*3/uL (ref 0.0–0.4)
Eos: 1 %
Hematocrit: 38 % (ref 34.0–46.6)
Hemoglobin: 12.7 g/dL (ref 11.1–15.9)
Immature Grans (Abs): 0 10*3/uL (ref 0.0–0.1)
Immature Granulocytes: 0 %
Lymphocytes Absolute: 2.7 10*3/uL (ref 0.7–3.1)
Lymphs: 29 %
MCH: 30.5 pg (ref 26.6–33.0)
MCHC: 33.4 g/dL (ref 31.5–35.7)
MCV: 91 fL (ref 79–97)
Monocytes Absolute: 0.8 10*3/uL (ref 0.1–0.9)
Monocytes: 9 %
Neutrophils Absolute: 5.5 10*3/uL (ref 1.4–7.0)
Neutrophils: 61 %
Platelets: 212 10*3/uL (ref 150–450)
RBC: 4.16 x10E6/uL (ref 3.77–5.28)
RDW: 15 % (ref 11.7–15.4)
WBC: 9.1 10*3/uL (ref 3.4–10.8)

## 2019-07-10 LAB — COMPREHENSIVE METABOLIC PANEL
ALT: 18 IU/L (ref 0–32)
AST: 23 IU/L (ref 0–40)
Albumin/Globulin Ratio: 1.6 (ref 1.2–2.2)
Albumin: 4.2 g/dL (ref 3.6–4.6)
Alkaline Phosphatase: 42 IU/L (ref 39–117)
BUN/Creatinine Ratio: 18 (ref 12–28)
BUN: 23 mg/dL (ref 8–27)
Bilirubin Total: 0.8 mg/dL (ref 0.0–1.2)
CO2: 18 mmol/L — ABNORMAL LOW (ref 20–29)
Calcium: 9.6 mg/dL (ref 8.7–10.3)
Chloride: 105 mmol/L (ref 96–106)
Creatinine, Ser: 1.26 mg/dL — ABNORMAL HIGH (ref 0.57–1.00)
GFR calc Af Amer: 44 mL/min/{1.73_m2} — ABNORMAL LOW (ref >59)
GFR calc non Af Amer: 38 mL/min/{1.73_m2} — ABNORMAL LOW (ref >59)
Globulin, Total: 2.7 g/dL (ref 1.5–4.5)
Glucose: 89 mg/dL (ref 65–99)
Potassium: 4.5 mmol/L (ref 3.5–5.2)
Sodium: 138 mmol/L (ref 134–144)
Total Protein: 6.9 g/dL (ref 6.0–8.5)

## 2019-07-10 LAB — VITAMIN D 25 HYDROXY (VIT D DEFICIENCY, FRACTURES): Vit D, 25-Hydroxy: 26.4 ng/mL — ABNORMAL LOW (ref 30.0–100.0)

## 2019-08-12 ENCOUNTER — Telehealth: Payer: Self-pay | Admitting: Hematology and Oncology

## 2019-08-12 NOTE — Telephone Encounter (Signed)
Returned patient's phone call regarding rescheduling 12/09 appointment, per patient's request appointment has moved to 12/17.

## 2019-08-19 ENCOUNTER — Ambulatory Visit: Payer: Medicare Other | Admitting: Hematology and Oncology

## 2019-08-26 NOTE — Progress Notes (Signed)
Patient Care Team: [name], MD as PCP - General (Family Medicine)  DIAGNOSIS:    ICD-10-CM   1. Malignant neoplasm of upper-inner quadrant of left breast in female, estrogen receptor positive ([place])  C50.212    Z17.0     SUMMARY OF ONCOLOGIC HISTORY: Oncology History  History of right breast cancer (Resolved)  05/24/2008 Surgery    Right breast lumpectomy revealing 0.9 cm grade 1 invasive ductal carcinoma with negative margins, ER positive, PR positive, HER-2 negative   06/29/2008 - 08/06/2008 Radiation Therapy    adjuvant radiation therapy   10/11/2008 - 10/08/2013 Anti-estrogen oral therapy    antiestrogen therapy with Femara then switched to tamoxifen due to osteoporosis completed 5 years of tamoxifen , patient declined extended adjuvant therapy.   Malignant neoplasm of upper-inner quadrant of left breast in female, estrogen receptor positive ([place])  09/18/2017 Initial Diagnosis   Left breast biopsy 11 o'clock position: IDC grade 2, 0.4 cm by mammogram.  The biopsy had 0.5 cm of material.  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  The Hereditary Gene Panel offered by [facility] includes sequencing and/or deletion duplication testing of the following 47 genes: APC, ATM, AXIN2, BARD1, BMPR1A, BRCA1, BRCA2, BRIP1, CDH1, CDK4, CDKN2A (p14ARF), CDKN2A (p16INK4a), CHEK2, CTNNA1, DICER1, EPCAM (Deletion/duplication testing only), GREM1 (promoter region deletion/duplication testing only), KIT, MEN1, MLH1, MSH2, MSH3, MSH6, MUTYH, NBN, NF1, NHTL1, PALB2, PDGFRA, PMS2, POLD1, POLE, PTEN, RAD50, RAD51C, RAD51D, SDHB, SDHC, SDHD, SMAD4, SMARCA4. STK11, TP53, TSC1,  TSC2, and VHL.  The following genes were evaluated for sequence changes only: SDHA and HOXB13 c.251G>A variant only. The report date is October 02, 2018.     CHIEF COMPLIANT: Follow-up of left breast cancer on tamoxifen therapy  INTERVAL HISTORY: Shannon Jensen is a 85 y.o. with above-mentioned history of left breast cancer who underwent a lumpectomy and is currently on tamoxifen. Mammogram on 05/21/19 showed no evidence of malignancy bilaterally, and a stable seroma in the upper inner right breast. She presents to the clinic today for annual follow-up.   REVIEW OF SYSTEMS:   Constitutional: Denies fevers, chills or abnormal weight loss Eyes: Denies blurriness of vision Ears, nose, mouth, throat, and face: Denies mucositis or sore throat Respiratory: Denies cough, dyspnea or wheezes Cardiovascular: Denies palpitation, chest discomfort Gastrointestinal: Denies nausea, heartburn or change in bowel habits Skin: Denies abnormal skin rashes Lymphatics: Denies new lymphadenopathy or easy bruising Neurological: Denies numbness, tingling or new weaknesses Behavioral/Psych: Mood is stable, no new changes  Extremities: No lower extremity edema Breast: denies any pain or lumps or nodules in either breasts All other systems were reviewed with the patient and are negative.  I have reviewed the past medical history, past surgical history, social history and family history with the patient and they are unchanged from previous note.  ALLERGIES:  is allergic to aspirin; celecoxib; erythromycin; iodine; nabumetone; nsaids; oxycodone-acetaminophen; tyloxapol; and vioxx [rofecoxib].  MEDICATIONS:  Current Outpatient Medications  Medication Sig Dispense Refill  . acetaminophen (TYLENOL) 325 MG tablet Take 650 mg by mouth every 6 (six) hours as needed for moderate pain or headache.     Marland Kitchen  amLODipine-olmesartan (AZOR) 5-40 MG tablet Take 1 tablet by mouth daily. 90 tablet 3  . Artificial Tear Ointment (DRY  EYES OP) Place 1-2 drops into both eyes 2 (two) times daily as needed (for dry eyes).    . Cholecalciferol (VITAMIN D) 2000 units CAPS Take 2,000 Units by mouth daily.    . Difluprednate (DUREZOL) 0.05 % EMUL Durezol 0.05 % eye drops  INSTILL 1 DROP INTO LEFT EYE 3 TIMES A DAY    . LATANOPROSTENE BUNOD OP Apply to eye.    . mometasone (ELOCON) 0.1 % lotion Apply 3 drops in affected ear(s) three times daily for 2-4 days until itching is resolved. Repeat as needed. 60 mL   . Multiple Vitamins-Minerals (WOMENS ONE DAILY) TABS Take 1 tablet by mouth daily.      . Omega-3 Fatty Acids (FISH OIL) 1200 MG CAPS Take 1,200 mg by mouth daily.     . tamoxifen (NOLVADEX) 10 MG tablet Take 1 tablet (10 mg total) by mouth daily. 90 tablet 3   No current facility-administered medications for this visit.    PHYSICAL EXAMINATION: ECOG PERFORMANCE STATUS: 0 - Asymptomatic  Vitals:   08/27/19 1158  BP: (!) 166/79  Pulse: 99  Resp: 17  Temp: 98.5 F (36.9 C)  SpO2: 100%   Filed Weights   08/27/19 1158  Weight: 155 lb 12.8 oz (70.7 kg)    GENERAL: alert, no distress and comfortable SKIN: skin color, texture, turgor are normal, no rashes or significant lesions EYES: normal, Conjunctiva are pink and non-injected, sclera clear OROPHARYNX: no exudate, no erythema and lips, buccal mucosa, and tongue normal  NECK: supple, thyroid normal size, non-tender, without nodularity LYMPH: no palpable lymphadenopathy in the cervical, axillary or inguinal LUNGS: clear to auscultation and percussion with normal breathing effort HEART: regular rate & rhythm and no murmurs and no lower extremity edema ABDOMEN: abdomen soft, non-tender and normal bowel sounds MUSCULOSKELETAL: no cyanosis of digits and no clubbing  NEURO: alert & oriented x 3 with fluent speech, no focal motor/sensory deficits EXTREMITIES: No lower extremity edema BREAST: No palpable masses or nodules in either right or left breasts. No palpable  axillary supraclavicular or infraclavicular adenopathy no breast tenderness or nipple discharge. (exam performed in the presence of a chaperone)  LABORATORY DATA:  I have reviewed the data as listed CMP Latest Ref Rng & Units 07/09/2019 11/12/2017 08/27/2016  Glucose 65 - 99 mg/dL 89 96 89  BUN 8 - 27 mg/dL _0 Creatinine 0.57 - 1.00 mg/dL 1.26(H) 0.88 0.83  Sodium 134 - 144 mmol/L 138 139 138  Potassium 3.5 - 5.2 mmol/L 4.5 4.0 4.0  Chloride 96 - 106 mmol/L 105 104 105  CO2 20 - 29 mmol/L 18(L) 26 25  Calcium 8.7 - 10.3 mg/dL 9.6 10.2 9.5  Total Protein 6.0 - 8.5 g/dL 6.9 - 7.0  Total Bilirubin 0.0 - 1.2 mg/dL 0.8 - 0.7  Alkaline Phos 39 - 117 IU/L 42 - 65  AST 0 - 40 IU/L 23 - 20  ALT 0 - 32 IU/L 18 - 13    Lab Results  Component Value Date   WBC 9.1 07/09/2019   HGB 12.7 07/09/2019   HCT 38.0 07/09/2019   MCV 91 07/09/2019   PLT 212 07/09/2019   NEUTROABS 5.5 07/09/2019    ASSESSMENT & PLAN:  Malignant neoplasm of upper-inner quadrant of left breast in female, estrogen receptor positive ([place]) 09/18/2017:Left breast biopsy 11 o'clock position: IDC grade 23/08/2018:Left lumpectomy:  IDC grade 1, 0.6 cm, low-grade DCIS, margins negative, T1 be N0 stage I a, ER 100%, PR 70%, HER-2 negative, Ki-67 2%  (History of right breast cancer Right breast invasive ductal carcinoma T1b N0 M0 stage IA diagnosis in 2009 status post lumpectomy 05/24/2008 ER positive PR positive HER-2 negative status post adjuvant radiation and took 5 years of antiestrogen therapy with tamoxifen completed 10/08/2013 Patient was offered extended adjuvant therapy but she declined)  Current treatment: Tamoxifen 10 mg daily(patient refusedradiation)   Tamoxifen toxicities:  Hot flashes mild to moderate in severity.    Because she has several family members with prostate cancer, we performed genetic testing: Results were negative for deleterious mutations  Breast cancer surveillance: 1.  Breast exam  08/27/2019: Benign, palpable nodularity at the site of surgery 2.  Mammogram and ultrasound 05/21/2019: Stable postoperative seroma.  No evidence of malignancy.  Breast density category B  Return to clinicin 1 yr for follow-up    No orders of the defined types were placed in this encounter.  The patient has a good understanding of the overall plan. she agrees with it. she will call with any problems that may develop before the next visit here.  Nicholas Lose, MD 08/27/2019  Julious Oka Dorshimer, am acting as scribe for Dr. Nicholas Lose.  I have reviewed the above document for accuracy and completeness, and I agree with the above.

## 2019-08-27 ENCOUNTER — Other Ambulatory Visit: Payer: Self-pay

## 2019-08-27 ENCOUNTER — Inpatient Hospital Stay: Payer: Medicare Other | Attending: Hematology and Oncology | Admitting: Hematology and Oncology

## 2019-08-27 DIAGNOSIS — Z170 Estrogen receptor positive status [ER+]: Secondary | ICD-10-CM | POA: Diagnosis not present

## 2019-08-27 DIAGNOSIS — Z853 Personal history of malignant neoplasm of breast: Secondary | ICD-10-CM | POA: Diagnosis not present

## 2019-08-27 DIAGNOSIS — C50212 Malignant neoplasm of upper-inner quadrant of left female breast: Secondary | ICD-10-CM | POA: Diagnosis not present

## 2019-08-27 DIAGNOSIS — N951 Menopausal and female climacteric states: Secondary | ICD-10-CM | POA: Diagnosis not present

## 2019-08-27 MED ORDER — TAMOXIFEN CITRATE 10 MG PO TABS: 10.0000 mg | ORAL_TABLET | Freq: Every day | ORAL | 3 refills | Status: DC

## 2019-08-27 MED ORDER — MOMETASONE FUROATE 0.1 % EX SOLN: CUTANEOUS | Status: DC

## 2019-08-27 NOTE — Assessment & Plan Note (Signed)
09/18/2017:Left breast biopsy 11 o'clock position: IDC grade 23/08/2018:Left lumpectomy: IDC grade 1, 0.6 cm, low-grade DCIS, margins negative, T1 be N0 stage I a, ER 100%, PR 70%, HER-2 negative, Ki-67 2%  (History of right breast cancer Right breast invasive ductal carcinoma T1b N0 M0 stage IA diagnosis in 2009 status post lumpectomy 05/24/2008 ER positive PR positive HER-2 negative status post adjuvant radiation and took 5 years of antiestrogen therapy with tamoxifen completed 10/08/2013 Patient was offered extended adjuvant therapy but she declined)  Current treatment: Tamoxifen 10 mg daily(patient refusedradiation)   Tamoxifen toxicities: 1.Hot flashes mild to moderate in severity.    Because she has several family members with prostate cancer, we performed genetic testing: Results were negative for deleterious mutations  Breast cancer surveillance: 1.  Breast exam 08/27/2019: Benign 2.  Mammogram and ultrasound 05/21/2019: Stable postoperative seroma.  No evidence of malignancy.  Breast density category B  Return to clinicin 1 yr for follow-up

## 2019-08-28 ENCOUNTER — Telehealth: Payer: Self-pay | Admitting: Hematology and Oncology

## 2019-08-28 NOTE — Telephone Encounter (Signed)
I talk with patient regarding schedule  

## 2019-08-31 ENCOUNTER — Ambulatory Visit: Payer: Medicare Other | Admitting: Hematology and Oncology

## 2019-10-08 ENCOUNTER — Ambulatory Visit
Admission: RE | Admit: 2019-10-08 | Discharge: 2019-10-08 | Disposition: A | Discharge status: Home / Self Care | Payer: Medicare Other | Source: Ambulatory Visit | Attending: Family Medicine | Admitting: Family Medicine

## 2019-10-08 ENCOUNTER — Other Ambulatory Visit: Payer: Self-pay

## 2019-10-08 DIAGNOSIS — M8588 Other specified disorders of bone density and structure, other site: Secondary | ICD-10-CM | POA: Diagnosis not present

## 2019-10-08 DIAGNOSIS — M810 Age-related osteoporosis without current pathological fracture: Secondary | ICD-10-CM | POA: Diagnosis not present

## 2019-10-28 ENCOUNTER — Telehealth: Payer: Self-pay

## 2019-10-28 NOTE — Telephone Encounter (Signed)
I received a fax from the pts. Pharmacy stating that her Azor 5/40 mg needs a PA per the pts. Insurance and cover my meds the medication does not need a PA.

## 2019-11-05 ENCOUNTER — Telehealth: Payer: Self-pay | Admitting: Family Medicine

## 2019-11-05 DIAGNOSIS — M810 Age-related osteoporosis without current pathological fracture: Secondary | ICD-10-CM

## 2019-11-05 MED ORDER — IBANDRONATE SODIUM 150 MG PO TABS: 150.0000 mg | ORAL_TABLET | ORAL | 3 refills | Status: DC

## 2019-11-05 NOTE — Telephone Encounter (Signed)
LVM for pt to call back for info concern new med . [place]

## 2019-11-05 NOTE — Telephone Encounter (Signed)
Explained that we cannot give the shot that I would like and will need to place her on a pill that she takes once per month on an empty stomach sitting upright and we can recheck this in 2 years

## 2019-11-05 NOTE — Telephone Encounter (Signed)
Her prior authorization for Prolia was denied.  She does not meet insurance criteria. Sending denial back for your review.

## 2019-11-06 NOTE — Telephone Encounter (Signed)
Sent my chart. [place]

## 2019-11-11 ENCOUNTER — Telehealth: Payer: Self-pay | Admitting: Family Medicine

## 2019-11-11 NOTE — Telephone Encounter (Signed)
Pt wants you to give her a call when you can

## 2019-11-11 NOTE — Telephone Encounter (Signed)
Pt was advise about new med and will pick it up. Shannon Jensen

## 2019-11-12 ENCOUNTER — Telehealth: Payer: Self-pay

## 2019-11-12 NOTE — Telephone Encounter (Signed)
Pt. Called stating she was at the pharmacy to pick up her A-zor and they still did not have it ready for her, I talked to the pharmacists and he said it was still saying not covered by the insurance even though I had already submitted a PA back on 10/28/19 that said it was approved. I then called the insurance company and they told me that the medication had to be ran for the generic for A-zor the amlodipine-olmesartan which is the same medication, the pharmacy is now feeling it for the generic so the pt. Can get her medication now. I told the pt. This information and she said that it was not the same and I tired to explain to her that it was the same thing one is just a brand name and the other is the generic, she did finally agree to the generic.

## 2020-01-11 DIAGNOSIS — I10 Essential (primary) hypertension: Secondary | ICD-10-CM | POA: Diagnosis not present

## 2020-01-11 DIAGNOSIS — H04123 Dry eye syndrome of bilateral lacrimal glands: Secondary | ICD-10-CM | POA: Diagnosis not present

## 2020-01-11 DIAGNOSIS — H401132 Primary open-angle glaucoma, bilateral, moderate stage: Secondary | ICD-10-CM | POA: Diagnosis not present

## 2020-02-05 ENCOUNTER — Other Ambulatory Visit: Payer: Self-pay | Admitting: Orthopedic Surgery

## 2020-02-05 DIAGNOSIS — M545 Low back pain, unspecified: Secondary | ICD-10-CM

## 2020-02-05 DIAGNOSIS — M25561 Pain in right knee: Secondary | ICD-10-CM | POA: Diagnosis not present

## 2020-02-05 DIAGNOSIS — G8929 Other chronic pain: Secondary | ICD-10-CM

## 2020-02-05 DIAGNOSIS — M25562 Pain in left knee: Secondary | ICD-10-CM | POA: Diagnosis not present

## 2020-02-09 ENCOUNTER — Telehealth: Payer: Self-pay

## 2020-02-09 NOTE — Telephone Encounter (Signed)
Spoke with patient to review her medications and drug allergies so that she may be scheduled for a lumbar myelogram.  She has no medications to hold.  She was informed she will be here two hours, will need a driver (who cannot stay inside the office with her), and will need to be on strict bedrest for 24 hours following the procedure.

## 2020-02-17 ENCOUNTER — Telehealth: Payer: Self-pay

## 2020-02-17 NOTE — Telephone Encounter (Signed)
Patient's daughter returned my call.  We have been attempting to reach her to inform her of a 13hr prep called [facility] [place], but her phone has been "busy" all day.  Ivin Booty confirmed the pharmacy was the correct one, and she also stated she will be the one driving her mom to/from here tomorrow.  I informed her that her mom will need to take Prednisone 50mg  PO this evening at 2130 and tomorrow at 0330 and 0930.  She also needs to take Benadryl 50mg  PO tomorrow morning at 0930.  Finally, I confirmed their arrival time of 1000 tomorrow, not 1040 that the CT automated reminder call service will say.

## 2020-02-18 ENCOUNTER — Ambulatory Visit
Admission: RE | Admit: 2020-02-18 | Discharge: 2020-02-18 | Disposition: A | Discharge status: Home / Self Care | Payer: Medicare PPO | Source: Ambulatory Visit | Attending: Orthopedic Surgery | Admitting: Orthopedic Surgery

## 2020-02-18 ENCOUNTER — Other Ambulatory Visit: Payer: Self-pay

## 2020-02-18 DIAGNOSIS — M48061 Spinal stenosis, lumbar region without neurogenic claudication: Secondary | ICD-10-CM | POA: Diagnosis not present

## 2020-02-18 DIAGNOSIS — M545 Low back pain, unspecified: Secondary | ICD-10-CM

## 2020-02-18 DIAGNOSIS — M5127 Other intervertebral disc displacement, lumbosacral region: Secondary | ICD-10-CM | POA: Diagnosis not present

## 2020-02-18 MED ORDER — IOPAMIDOL (ISOVUE-M 200) INJECTION 41%
20.0000 mL | Freq: Once | INTRAMUSCULAR | Status: AC
  Administered 2020-02-18: 20 mL via INTRATHECAL

## 2020-02-18 MED ORDER — ONDANSETRON HCL 4 MG/2ML IJ SOLN: 4.0000 mg | Freq: Four times a day (QID) | INTRAMUSCULAR | Status: DC | PRN

## 2020-02-18 MED ORDER — DIAZEPAM 5 MG PO TABS
5.0000 mg | ORAL_TABLET | Freq: Once | ORAL | Status: AC
  Administered 2020-02-18: 5 mg via ORAL

## 2020-02-18 NOTE — Progress Notes (Signed)
Patient states she took the 13-hour prep as prescribed.

## 2020-02-18 NOTE — Discharge Instructions (Signed)

## 2020-03-18 DIAGNOSIS — M79672 Pain in left foot: Secondary | ICD-10-CM | POA: Diagnosis not present

## 2020-03-18 DIAGNOSIS — M79671 Pain in right foot: Secondary | ICD-10-CM | POA: Diagnosis not present

## 2020-03-18 DIAGNOSIS — M2041 Other hammer toe(s) (acquired), right foot: Secondary | ICD-10-CM | POA: Diagnosis not present

## 2020-03-18 DIAGNOSIS — M21612 Bunion of left foot: Secondary | ICD-10-CM | POA: Diagnosis not present

## 2020-03-18 DIAGNOSIS — L600 Ingrowing nail: Secondary | ICD-10-CM | POA: Diagnosis not present

## 2020-03-24 DIAGNOSIS — M545 Low back pain: Secondary | ICD-10-CM | POA: Diagnosis not present

## 2020-03-24 DIAGNOSIS — M25561 Pain in right knee: Secondary | ICD-10-CM | POA: Diagnosis not present

## 2020-03-24 DIAGNOSIS — M25562 Pain in left knee: Secondary | ICD-10-CM | POA: Diagnosis not present

## 2020-04-26 DIAGNOSIS — M5136 Other intervertebral disc degeneration, lumbar region: Secondary | ICD-10-CM | POA: Diagnosis not present

## 2020-05-11 DIAGNOSIS — M545 Low back pain: Secondary | ICD-10-CM | POA: Diagnosis not present

## 2020-06-08 ENCOUNTER — Ambulatory Visit (INDEPENDENT_AMBULATORY_CARE_PROVIDER_SITE_OTHER): Payer: Medicare PPO | Admitting: Otolaryngology

## 2020-06-08 ENCOUNTER — Other Ambulatory Visit: Payer: Self-pay

## 2020-06-08 VITALS — Temp 97.3°F

## 2020-06-08 DIAGNOSIS — H903 Sensorineural hearing loss, bilateral: Secondary | ICD-10-CM

## 2020-06-08 DIAGNOSIS — H6123 Impacted cerumen, bilateral: Secondary | ICD-10-CM | POA: Diagnosis not present

## 2020-06-08 NOTE — Progress Notes (Signed)
HPI: Shannon Jensen is a 86 y.o. female who presents for evaluation of dry ears that itch occasionally.  She also notices decreased hearing and presents with her son she also complains of mucus that is clear that comes out of her nose.  She does not want hearing aids.  Past Medical History:  Diagnosis Date  . Anxiety and depression   . Arthritis   . Breast cancer [facility]) 12/ 2009   right breast  . Chronic headaches   . Cystocele   . Diverticulosis   . Family history of breast cancer   . Family history of colon cancer   . Family history of pancreatic cancer   . Family history of prostate cancer   . GERD (gastroesophageal reflux disease)   . H/O vitamin D deficiency   . HLD (hyperlipidemia)   . Hypertension   . Osteoarthritis   . Osteoporosis   . Pelvic relaxation   . Personal history of radiation therapy   . Urinary tract infection 2014   Past Surgical History:  Procedure Laterality Date  . ABDOMINAL HYSTERECTOMY  1998  . APPENDECTOMY  1970  . BREAST LUMPECTOMY Right 2009   malignant  . BREAST LUMPECTOMY Left 2019  . BREAST LUMPECTOMY WITH RADIOACTIVE SEED LOCALIZATION Left 11/19/2017   Procedure: BREAST LUMPECTOMY WITH RADIOACTIVE SEED LOCALIZATION;  Surgeon: Rolm Bookbinder, MD;  Location: [place];  Service: General;  Laterality: Left;  . BREAST SURGERY  2009   cancer-lymph node removal  . NECK SURGERY     cyst removal   Social History   Socioeconomic History  . Marital status: Widowed    Spouse name: Not on file  . Number of children: 4  . Years of education: Not on file  . Highest education level: Not on file  Occupational History  . Occupation: retired    Fish farm manager: RETIRED  Tobacco Use  . Smoking status: Never Smoker  . Smokeless tobacco: Never Used  Vaping Use  . Vaping Use: Never used  Substance and Sexual Activity  . Alcohol use: No  . Drug use: No  . Sexual activity: Never  Other Topics Concern  . Not on file  Social History Narrative  . Not on  file   Social Determinants of Health   Financial Resource Strain:   . Difficulty of Paying Living Expenses: Not on file  Food Insecurity:   . Worried About Charity fundraiser in the Last Year: Not on file  . Ran Out of Food in the Last Year: Not on file  Transportation Needs:   . Lack of Transportation (Medical): Not on file  . Lack of Transportation (Non-Medical): Not on file  Physical Activity:   . Days of Exercise per Week: Not on file  . Minutes of Exercise per Session: Not on file  Stress:   . Feeling of Stress : Not on file  Social Connections:   . Frequency of Communication with Friends and Family: Not on file  . Frequency of Social Gatherings with Friends and Family: Not on file  . Attends Religious Services: Not on file  . Active Member of Clubs or Organizations: Not on file  . Attends Archivist Meetings: Not on file  . Marital Status: Not on file   Family History  Problem Relation Age of Onset  . Prostate cancer Father 85       d. 15  . Heart disease Paternal Uncle   . Colon cancer Mother  dx 50s-60s  . Breast cancer Sister        dx >50  . Pancreatic cancer Brother   . Prostate cancer Brother   . Colon polyps Daughter        x 2, son  . Stroke Daughter        x 2  . Appendicitis Daughter        appendectomy  . Pancreatic cancer Sister 62       d. 34  . Prostate cancer Nephew        dx.50s   Allergies  Allergen Reactions  . Celecoxib Other (See Comments)    Unknown  . Erythromycin Other (See Comments)    Unknown  . Iodine Other (See Comments)    Unknown 13 hour prep  . Nabumetone Other (See Comments)    Unknown  . Nsaids Other (See Comments)    Unknown Unknown   . Oxycodone-Acetaminophen Other (See Comments)  . Tyloxapol   . Vioxx [Rofecoxib] Other (See Comments)    Unknown   Prior to Admission medications   Medication Sig Start Date End Date Taking? Authorizing Provider  acetaminophen (TYLENOL) 325 MG tablet Take 650 mg  by mouth every 6 (six) hours as needed for moderate pain or headache.    Yes [provider]  amLODipine-olmesartan (AZOR) 5-40 MG tablet Take 1 tablet by mouth daily. 07/09/19  Yes [name], MD  Artificial Tear Ointment (DRY EYES OP) Place 1-2 drops into both eyes 2 (two) times daily as needed (for dry eyes).   Yes [provider]  Cholecalciferol (VITAMIN D) 2000 units CAPS Take 2,000 Units by mouth daily.   Yes [provider]  Multiple Vitamins-Minerals (WOMENS ONE DAILY) TABS Take 1 tablet by mouth daily.     Yes [provider]  Omega-3 Fatty Acids (FISH OIL) 1200 MG CAPS Take 1,200 mg by mouth daily.    Yes [provider]  tamoxifen (NOLVADEX) 10 MG tablet Take 1 tablet (10 mg total) by mouth daily. 08/27/19  Yes Nicholas Lose, MD     Positive ROS: Otherwise negative  All other systems have been reviewed and were otherwise negative with the exception of those mentioned in the HPI and as above.  Physical Exam: Constitutional: Alert, well-appearing, no acute distress Ears: External ears without lesions or tenderness.  She has wax buildup in both ear canals that was cleaned with forceps and curettes.  TMs are clear bilaterally.  On hearing screening with the 512 1024 tuning fork she has mild to moderate hearing loss in both ears. Nasal: External nose without lesions. Septum straight with mild rhinitis. Clear nasal passages with no signs of infection. Oral: Lips and gums without lesions. Tongue and palate mucosa without lesions. Posterior oropharynx clear. Neck: No palpable adenopathy or masses Respiratory: Breathing comfortably  Skin: No facial/neck lesions or rash noted.  Cerumen impaction removal  Date/Time: 06/08/2020 12:42 PM Performed by: Rozetta Nunnery, MD Authorized by: Rozetta Nunnery, MD   Consent:    Consent obtained:  Verbal   Consent given by:  Patient   Risks discussed:  Pain and bleeding Procedure  details:    Location:  L ear and R ear   Procedure type: curette and forceps   Post-procedure details:    Inspection:  TM intact and canal normal   Hearing quality:  Improved   Patient tolerance of procedure:  Tolerated well, no immediate complications Comments:     TMs are clear bilaterally  Assessment: Reviewed with the son as well as the patient that she has hearing loss and the only treatment option for this would be hearing aids.  Suggested obtaining audiogram to evaluate hearing loss. Itching in ears.  Plan: Suggested obtaining audiogram to evaluate hearing loss and possible use of hearing aids which she does not want to use. Prescribed fluocinolone oil 0.01% to use in the ears as needed itching.  She already uses a sweet oil which is okay to use.  Radene Journey, MD

## 2020-06-17 ENCOUNTER — Other Ambulatory Visit: Payer: Self-pay | Admitting: Hematology and Oncology

## 2020-06-17 DIAGNOSIS — Z9889 Other specified postprocedural states: Secondary | ICD-10-CM

## 2020-06-21 ENCOUNTER — Other Ambulatory Visit: Payer: Self-pay

## 2020-06-21 ENCOUNTER — Ambulatory Visit
Admission: RE | Admit: 2020-06-21 | Discharge: 2020-06-21 | Disposition: A | Discharge status: Home / Self Care | Payer: Medicare PPO | Source: Ambulatory Visit | Attending: Hematology and Oncology | Admitting: Hematology and Oncology

## 2020-06-21 DIAGNOSIS — R928 Other abnormal and inconclusive findings on diagnostic imaging of breast: Secondary | ICD-10-CM | POA: Diagnosis not present

## 2020-06-21 DIAGNOSIS — Z9889 Other specified postprocedural states: Secondary | ICD-10-CM

## 2020-07-19 ENCOUNTER — Encounter: Payer: Self-pay | Admitting: Family Medicine

## 2020-07-19 ENCOUNTER — Other Ambulatory Visit: Payer: Self-pay

## 2020-07-19 ENCOUNTER — Telehealth: Payer: Self-pay | Admitting: Family Medicine

## 2020-07-19 ENCOUNTER — Ambulatory Visit: Payer: Medicare PPO | Admitting: Family Medicine

## 2020-07-19 VITALS — BP 128/72 | HR 90 | Temp 98.6°F | Ht 61.5 in | Wt 154.0 lb

## 2020-07-19 DIAGNOSIS — I10 Essential (primary) hypertension: Secondary | ICD-10-CM | POA: Diagnosis not present

## 2020-07-19 DIAGNOSIS — E785 Hyperlipidemia, unspecified: Secondary | ICD-10-CM

## 2020-07-19 DIAGNOSIS — C50212 Malignant neoplasm of upper-inner quadrant of left female breast: Secondary | ICD-10-CM

## 2020-07-19 DIAGNOSIS — Z8639 Personal history of other endocrine, nutritional and metabolic disease: Secondary | ICD-10-CM | POA: Diagnosis not present

## 2020-07-19 DIAGNOSIS — Z0000 Encounter for general adult medical examination without abnormal findings: Secondary | ICD-10-CM | POA: Diagnosis not present

## 2020-07-19 DIAGNOSIS — Z170 Estrogen receptor positive status [ER+]: Secondary | ICD-10-CM | POA: Diagnosis not present

## 2020-07-19 DIAGNOSIS — K219 Gastro-esophageal reflux disease without esophagitis: Secondary | ICD-10-CM

## 2020-07-19 DIAGNOSIS — M810 Age-related osteoporosis without current pathological fracture: Secondary | ICD-10-CM | POA: Diagnosis not present

## 2020-07-19 DIAGNOSIS — Z23 Encounter for immunization: Secondary | ICD-10-CM

## 2020-07-19 DIAGNOSIS — N3000 Acute cystitis without hematuria: Secondary | ICD-10-CM

## 2020-07-19 LAB — POCT URINALYSIS DIP (PROADVANTAGE DEVICE)
Bilirubin, UA: NEGATIVE
Glucose, UA: NEGATIVE mg/dL
Nitrite, UA: POSITIVE — AB
Protein Ur, POC: 30 mg/dL — AB
Specific Gravity, Urine: 1.015
Urobilinogen, Ur: 0.2
pH, UA: 7.5 (ref 5.0–8.0)

## 2020-07-19 MED ORDER — SULFAMETHOXAZOLE-TRIMETHOPRIM 800-160 MG PO TABS: 1.0000 | ORAL_TABLET | Freq: Two times a day (BID) | ORAL | 0 refills | Status: DC

## 2020-07-19 MED ORDER — AMLODIPINE-OLMESARTAN 5-40 MG PO TABS: 1.0000 | ORAL_TABLET | Freq: Every day | ORAL | 3 refills | Status: DC

## 2020-07-19 NOTE — Patient Instructions (Signed)
  Shannon Jensen , Thank you for taking time to come for your Medicare Wellness Visit. I appreciate your ongoing commitment to your health goals. Please review the following plan we discussed and let me know if I can assist you in the future.   These are the goals we discussed: Goals   None     This is a list of the screening recommended for you and due dates:  Health Maintenance  Topic Date Due  . COVID-19 Vaccine (1) Never done  . Pap Smear  05/18/2016  . Mammogram  06/21/2021  . Tetanus Vaccine  04/13/2030  . Flu Shot  Completed  . DEXA scan (bone density measurement)  Completed  . Pneumonia vaccines  Completed

## 2020-07-19 NOTE — Telephone Encounter (Signed)
Let her know that it looks like she has a bladder infection.  And I am calling in an antibiotic.

## 2020-07-19 NOTE — Telephone Encounter (Signed)
LVM for pt to call back [facility]

## 2020-07-19 NOTE — Telephone Encounter (Signed)
Pt left and states she forgot to ask you about her using the bathroom (urinating more at night) is there something she can take. She left a sample in the bathroom, she can be reached at [phone]

## 2020-07-19 NOTE — Progress Notes (Signed)
Shannon Jensen is a 86 y.o. female who presents for annual wellness visit and follow-up on chronic medical conditions.  She has arthritis as well as spinal stenosis.  She is being followed by Dr. Gladstone Lighter for this.  She is also on tamoxifen for her breast cancer and is seeing Dr. Lindi Adie for this.  Recently her vitamin D was increased to 5000 international units apparently by her podiatrist.  She does have a history of osteoporosis.  She was supposed to start Prolia but this has not been accomplished.  Have a history of low vitamin D level.  She continues on Azo for her blood pressure and having no difficulty with that.  She does have reflux however is not taking any medication at the present time.  She is also having some urinary frequency and dysuria.  Immunizations and Health Maintenance Immunization History  Administered Date(s) Administered  . Fluad Quad(high Dose 65+) 07/09/2019  . Influenza Split 06/13/2012, 06/30/2015  . Influenza Whole 08/10/2009, 07/25/2010  . Influenza, High Dose Seasonal PF 08/15/2016, 05/29/2017, 11/24/2018  . Pneumococcal Conjugate-13 05/18/2014  . Pneumococcal Polysaccharide-23 05/28/2007  . Tdap 04/13/2020  . Zoster Recombinat (Shingrix) 08/28/2017   Health Maintenance Due  Topic Date Due  . COVID-19 Vaccine (1) Never done  . PAP SMEAR-Modifier  05/18/2016  . INFLUENZA VACCINE  04/10/2020    Last Pap smear: aged out  Last mammogram: 06/21/20 Last colonoscopy: 06/12/11 Last DEXA: 09/2819 Dentist: Q year Ophtho:Q year Exercise: walking and bed   Other doctors caring for patient include:  Dr. Lucia Gaskins ENT, Lindi Adie Advanced directives: Does Patient Have a Medical Advance Directive?: No Would patient like information on creating a medical advance directive?: Yes (ED - Information included in AVS)  Depression screen:  See questionnaire below.  Depression screen [facility] 2/9 07/19/2020 07/09/2019 01/14/2018 09/30/2015 08/09/2015  Decreased Interest 0 0 0 0 0  Down,  Depressed, Hopeless 0 0 0 0 0  PHQ - 2 Score 0 0 0 0 0    Fall Risk Screen: see questionnaire below. Fall Risk  07/19/2020 07/09/2019 01/14/2018 09/30/2015 08/09/2015  Falls in the past year? 0 0 No No No    ADL screen:  See questionnaire below Functional Status Survey: Is the patient deaf or have difficulty hearing?: Yes (had ears checked) Does the patient have difficulty seeing, even when wearing glasses/contacts?: No Does the patient have difficulty concentrating, remembering, or making decisions?: No Does the patient have difficulty walking or climbing stairs?: Yes (knees and back pain) Does the patient have difficulty dressing or bathing?: No Does the patient have difficulty doing errands alone such as visiting a doctor's office or shopping?: No   Review of Systems Constitutional: -, -unexpected weight change, -anorexia, -fatigue Allergy: -sneezing, -itching, -congestion Dermatology: denies changing moles, rash, lumps ENT: -runny nose, -ear pain, -sore throat,  Cardiology:  -chest pain, -palpitations, -orthopnea, Respiratory: -cough, -shortness of breath, -dyspnea on exertion, -wheezing,  Gastroenterology: -abdominal pain, -nausea, -vomiting, -diarrhea, -constipation, -dysphagia Hematology: -bleeding or bruising problems Musculoskeletal: -arthralgias, -myalgias, -joint swelling, -back pain, - Ophthalmology: -vision changes,  Urology: -dysuria, -difficulty urinating,  -urinary frequency, -urgency, incontinence Neurology: -, -numbness, , -memory loss, -falls, -dizziness    PHYSICAL EXAM:  General Appearance: Alert, cooperative, no distress, appears stated age Head: Normocephalic, without obvious abnormality, atraumatic Eyes: PERRL, conjunctiva/corneas clear, EOM's intact, Ears: Normal TM's and external ear canals Nose: Nares normal, mucosa normal, no drainage or sinus tenderness Throat: Lips, mucosa, and tongue normal; teeth and gums normal Neck: Supple, no  lymphadenopathy;   thyroid:  no enlargement/tenderness/nodules; no carotid bruit or JVD Lungs: Clear to auscultation bilaterally without wheezes, rales or ronchi; respirations unlabored Heart: Regular rate and rhythm, S1 and S2 normal, no murmur, rubor gallop Abdomen: Soft, non-tender, nondistended, normoactive bowel sounds,  no masses, no hepatosplenomegaly Skin:  Skin color, texture, turgor normal, no rashes or lesions Lymph nodes: Cervical, supraclavicular, and axillary nodes normal Neurologic:  CNII-XII intact, normal strength, sensation and gait; reflexes 2+ and symmetric throughout Psych: Normal mood, affect, hygiene and grooming. Urine dipstick was positive for nitrites blood and WBCs. ASSESSMENT/PLAN: Osteoporosis, unspecified osteoporosis type, unspecified pathological fracture presence: Prolia will be ordered  Hyperlipidemia LDL goal <130 - Plan: Lipid panel  Essential hypertension - Plan: CBC with Differential/Platelet, Comprehensive metabolic panel, POCT Urinalysis DIP (Proadvantage Device), amLODipine-olmesartan (AZOR) 5-40 MG tablet  H/O vitamin D deficiency - Plan: VITAMIN D 25 Hydroxy (Vit-D Deficiency, Fractures)  Gastroesophageal reflux disease without esophagitis: No change in therapy  Malignant neoplasm of upper-inner quadrant of left breast in female, estrogen receptor positive (HCC) - Plan: CBC with Differential/Platelet, Comprehensive metabolic panel: Continue to be followed by Dr. Payton Mccallum  Need for influenza vaccination - Plan: Flu Vaccine QUAD High Dose(Fluad)  .  Immunization recommendations discussed.  Colonoscopy recommendations reviewed   Medicare Attestation I have personally reviewed: The patient's medical and social history Their use of alcohol, tobacco or illicit drugs Their current medications and supplements The patient's functional ability including ADLs,fall risks, home safety risks, cognitive, and hearing and visual impairment Diet and physical activities Evidence  for depression or mood disorders  The patient's weight, height, and BMI have been recorded in the chart.  I have made referrals, counseling, and provided education to the patient based on review of the above and I have provided the patient with a written personalized care plan for preventive services.     Jill Alexanders, MD   07/19/2020

## 2020-07-20 LAB — COMPREHENSIVE METABOLIC PANEL
ALT: 12 IU/L (ref 0–32)
AST: 19 IU/L (ref 0–40)
Albumin/Globulin Ratio: 1.6 (ref 1.2–2.2)
Albumin: 4.1 g/dL (ref 3.6–4.6)
Alkaline Phosphatase: 40 IU/L — ABNORMAL LOW (ref 44–121)
BUN/Creatinine Ratio: 11 — ABNORMAL LOW (ref 12–28)
BUN: 10 mg/dL (ref 8–27)
Bilirubin Total: 0.6 mg/dL (ref 0.0–1.2)
CO2: 24 mmol/L (ref 20–29)
Calcium: 10 mg/dL (ref 8.7–10.3)
Chloride: 103 mmol/L (ref 96–106)
Creatinine, Ser: 0.93 mg/dL (ref 0.57–1.00)
GFR calc Af Amer: 63 mL/min/{1.73_m2} (ref >59)
GFR calc non Af Amer: 55 mL/min/{1.73_m2} — ABNORMAL LOW (ref >59)
Globulin, Total: 2.6 g/dL (ref 1.5–4.5)
Glucose: 86 mg/dL (ref 65–99)
Potassium: 4.4 mmol/L (ref 3.5–5.2)
Sodium: 140 mmol/L (ref 134–144)
Total Protein: 6.7 g/dL (ref 6.0–8.5)

## 2020-07-20 LAB — CBC WITH DIFFERENTIAL/PLATELET
Basophils Absolute: 0 10*3/uL (ref 0.0–0.2)
Basos: 0 %
EOS (ABSOLUTE): 0.1 10*3/uL (ref 0.0–0.4)
Eos: 1 %
Hematocrit: 37 % (ref 34.0–46.6)
Hemoglobin: 12 g/dL (ref 11.1–15.9)
Immature Grans (Abs): 0 10*3/uL (ref 0.0–0.1)
Immature Granulocytes: 0 %
Lymphocytes Absolute: 2.6 10*3/uL (ref 0.7–3.1)
Lymphs: 38 %
MCH: 30.6 pg (ref 26.6–33.0)
MCHC: 32.4 g/dL (ref 31.5–35.7)
MCV: 94 fL (ref 79–97)
Monocytes Absolute: 0.7 10*3/uL (ref 0.1–0.9)
Monocytes: 10 %
Neutrophils Absolute: 3.5 10*3/uL (ref 1.4–7.0)
Neutrophils: 51 %
Platelets: 206 10*3/uL (ref 150–450)
RBC: 3.92 x10E6/uL (ref 3.77–5.28)
RDW: 14 % (ref 11.7–15.4)
WBC: 6.9 10*3/uL (ref 3.4–10.8)

## 2020-07-20 LAB — LIPID PANEL
Chol/HDL Ratio: 3.5 ratio (ref 0.0–4.4)
Cholesterol, Total: 193 mg/dL (ref 100–199)
HDL: 55 mg/dL (ref >39)
LDL Chol Calc (NIH): 122 mg/dL — ABNORMAL HIGH (ref 0–99)
Triglycerides: 91 mg/dL (ref 0–149)
VLDL Cholesterol Cal: 16 mg/dL (ref 5–40)

## 2020-07-20 LAB — VITAMIN D 25 HYDROXY (VIT D DEFICIENCY, FRACTURES): Vit D, 25-Hydroxy: 57.5 ng/mL (ref 30.0–100.0)

## 2020-07-20 NOTE — Telephone Encounter (Signed)
Advised [facility]

## 2020-07-26 DIAGNOSIS — M25561 Pain in right knee: Secondary | ICD-10-CM | POA: Diagnosis not present

## 2020-07-26 DIAGNOSIS — M25562 Pain in left knee: Secondary | ICD-10-CM | POA: Diagnosis not present

## 2020-07-26 DIAGNOSIS — M545 Low back pain, unspecified: Secondary | ICD-10-CM | POA: Diagnosis not present

## 2020-07-26 DIAGNOSIS — M170 Bilateral primary osteoarthritis of knee: Secondary | ICD-10-CM | POA: Diagnosis not present

## 2020-08-12 ENCOUNTER — Telehealth: Payer: Self-pay | Admitting: Family Medicine

## 2020-08-12 NOTE — Telephone Encounter (Signed)
Left message for pt to call back. Juliann Pulse needs to speak to her concerning Prolia.

## 2020-08-18 ENCOUNTER — Telehealth: Payer: Self-pay | Admitting: Family Medicine

## 2020-08-18 NOTE — Telephone Encounter (Signed)
Left another message for pt to call concerning Prolia.  °

## 2020-08-23 DIAGNOSIS — M170 Bilateral primary osteoarthritis of knee: Secondary | ICD-10-CM | POA: Diagnosis not present

## 2020-08-25 ENCOUNTER — Encounter: Payer: Self-pay | Admitting: Family Medicine

## 2020-08-25 NOTE — Assessment & Plan Note (Deleted)
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   Because she has several family members with prostate cancer, we performed genetic testing: Results were negative for deleterious mutations  Breast cancer surveillance: 1.  Breast exam 08/26/2020: Benign, palpable nodularity at the site of surgery 2.  Mammogram and ultrasound 05/21/2019: Stable postoperative seroma.  No evidence of malignancy.  Breast density category B  Return to clinicin1 yr for follow-up

## 2020-08-26 ENCOUNTER — Encounter: Payer: Self-pay | Admitting: Family Medicine

## 2020-08-26 ENCOUNTER — Ambulatory Visit: Payer: Medicare Other | Admitting: Hematology and Oncology

## 2020-08-29 NOTE — Progress Notes (Signed)
Patient Care Team: [name], MD as PCP - General (Family Medicine)  DIAGNOSIS:    ICD-10-CM   1. Malignant neoplasm of upper-inner quadrant of left breast in female, estrogen receptor positive ([place])  C50.212    Z17.0     SUMMARY OF ONCOLOGIC HISTORY: Oncology History  History of right breast cancer (Resolved)  05/24/2008 Surgery    Right breast lumpectomy revealing 0.9 cm grade 1 invasive ductal carcinoma with negative margins, ER positive, PR positive, HER-2 negative   06/29/2008 - 08/06/2008 Radiation Therapy    adjuvant radiation therapy   10/11/2008 - 10/08/2013 Anti-estrogen oral therapy    antiestrogen therapy with Femara then switched to tamoxifen due to osteoporosis completed 5 years of tamoxifen , patient declined extended adjuvant therapy.   Malignant neoplasm of upper-inner quadrant of left breast in female, estrogen receptor positive ([place])  09/18/2017 Initial Diagnosis   Left breast biopsy 11 o'clock position: IDC grade 2, 0.4 cm by mammogram.  The biopsy had 0.5 cm of material.  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  The Hereditary Gene Panel offered by [facility] includes sequencing and/or deletion duplication testing of the following 47 genes: APC, ATM, AXIN2, BARD1, BMPR1A, BRCA1, BRCA2, BRIP1, CDH1, CDK4, CDKN2A (p14ARF), CDKN2A (p16INK4a), CHEK2, CTNNA1, DICER1, EPCAM (Deletion/duplication testing only), GREM1 (promoter region deletion/duplication testing only), KIT, MEN1, MLH1, MSH2, MSH3, MSH6, MUTYH, NBN, NF1, NHTL1, PALB2, PDGFRA, PMS2, POLD1, POLE, PTEN, RAD50, RAD51C, RAD51D, SDHB, SDHC, SDHD, SMAD4, SMARCA4. STK11, TP53, TSC1,  TSC2, and VHL.  The following genes were evaluated for sequence changes only: SDHA and HOXB13 c.251G>A variant only. The report date is October 02, 2018.     CHIEF COMPLIANT: Follow-up of left breast cancer on tamoxifen therapy  INTERVAL HISTORY: Shannon Jensen is a 86 y.o. with above-mentioned history of left breast cancer who underwentalumpectomy and is currently ontamoxifen. Mammogram on 06/21/20 showed no evidence of malignancy bilaterally. She presents to the clinic today for annual follow-up.   She recently celebrated her 89th birthday.  She has bilateral knee pains for which she receives injections but otherwise doing quite well.  Does not have any side effects from tamoxifen.  ALLERGIES:  is allergic to celecoxib, erythromycin, iodine, nabumetone, nsaids, oxycodone-acetaminophen, tyloxapol, and vioxx [rofecoxib].  MEDICATIONS:  Current Outpatient Medications  Medication Sig Dispense Refill  . acetaminophen (TYLENOL) 325 MG tablet Take 650 mg by mouth every 6 (six) hours as needed for moderate pain or headache.     Marland Kitchen amLODipine-olmesartan (AZOR) 5-40 MG tablet Take 1 tablet by mouth daily. 90 tablet 3  . Artificial Tear Ointment (DRY EYES OP) Place 1-2 drops into both eyes 2 (two) times daily as needed (for dry eyes).    . Cholecalciferol (VITAMIN D) 2000 units CAPS Take 2,000 Units by mouth daily.    . diclofenac Sodium (VOLTAREN) 1 % GEL     . Multiple Vitamins-Minerals (WOMENS ONE DAILY) TABS Take 1 tablet by mouth daily.      . Omega-3 Fatty Acids (FISH OIL) 1200 MG CAPS Take 1,200 mg by mouth daily.     Marland Kitchen sulfamethoxazole-trimethoprim (BACTRIM DS) 800-160  MG tablet Take 1 tablet by mouth 2 (two) times daily. 20 tablet 0  . tamoxifen (NOLVADEX) 10 MG tablet Take 1 tablet (10 mg total) by mouth daily. 90 tablet 3   No current facility-administered medications for this visit.    PHYSICAL EXAMINATION: ECOG PERFORMANCE STATUS: 1 - Symptomatic but completely  ambulatory  Vitals:   08/30/20 1457  BP: (!) 152/77  Pulse: 93  Resp: 17  Temp: 98.4 F (36.9 C)  SpO2: 100%   Filed Weights   08/30/20 1457  Weight: 149 lb (67.6 kg)    BREAST: No palpable masses or nodules in either right or left breasts. No palpable axillary supraclavicular or infraclavicular adenopathy no breast tenderness or nipple discharge. (exam performed in the presence of a chaperone)  LABORATORY DATA:  I have reviewed the data as listed CMP Latest Ref Rng & Units 07/19/2020 07/09/2019 11/12/2017  Glucose 65 - 99 mg/dL 86 89 96  BUN 8 - 27 mg/dL _0 Creatinine 0.57 - 1.00 mg/dL 0.93 1.26(H) 0.88  Sodium 134 - 144 mmol/L 140 138 139  Potassium 3.5 - 5.2 mmol/L 4.4 4.5 4.0  Chloride 96 - 106 mmol/L 103 105 104  CO2 20 - 29 mmol/L 24 18(L) 26  Calcium 8.7 - 10.3 mg/dL 10.0 9.6 10.2  Total Protein 6.0 - 8.5 g/dL 6.7 6.9 -  Total Bilirubin 0.0 - 1.2 mg/dL 0.6 0.8 -  Alkaline Phos 44 - 121 IU/L 40(L) 42 -  AST 0 - 40 IU/L 19 23 -  ALT 0 - 32 IU/L 12 18 -    Lab Results  Component Value Date   WBC 6.9 07/19/2020   HGB 12.0 07/19/2020   HCT 37.0 07/19/2020   MCV 94 07/19/2020   PLT 206 07/19/2020   NEUTROABS 3.5 07/19/2020    ASSESSMENT & PLAN:  Malignant neoplasm of upper-inner quadrant of left breast in female, estrogen receptor positive ([place]) 09/18/2017:Left breast biopsy 11 o'clock position: IDC grade 23/08/2018:Left lumpectomy: IDC grade 1, 0.6 cm, low-grade DCIS, margins negative, T1 be N0 stage I a, ER 100%, PR 70%, HER-2 negative, Ki-67 2%  (History of right breast cancer 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   Because she has several family members with prostate cancer, we performed genetic testing: Results were negative for deleterious mutations  Breast cancer surveillance: 1.  Breast exam 08/30/2020: Benign, palpable nodularity at the site of surgery 2.  Mammogram and ultrasound 06/21/2020: Stable postoperative seroma.  No evidence of malignancy.  Breast density category B  Return to clinicin1 yr for follow-up     No orders of the defined types were placed in this encounter.  The patient has a good understanding of the overall plan. she agrees with it. she will call with any problems that may develop before the next visit here.  Total time spent: 20 mins including face to face time and time spent for planning, charting and coordination of care  Nicholas Lose, MD 08/30/2020  I, Cloyde Reams Dorshimer, am acting as scribe for Dr. Nicholas Lose.  I have reviewed the above documentation for accuracy and completeness, and I agree with the above.

## 2020-08-30 ENCOUNTER — Other Ambulatory Visit: Payer: Self-pay

## 2020-08-30 ENCOUNTER — Inpatient Hospital Stay: Payer: Medicare PPO | Attending: Hematology and Oncology | Admitting: Hematology and Oncology

## 2020-08-30 DIAGNOSIS — M25562 Pain in left knee: Secondary | ICD-10-CM | POA: Diagnosis not present

## 2020-08-30 DIAGNOSIS — M170 Bilateral primary osteoarthritis of knee: Secondary | ICD-10-CM | POA: Diagnosis not present

## 2020-08-30 DIAGNOSIS — Z853 Personal history of malignant neoplasm of breast: Secondary | ICD-10-CM | POA: Diagnosis not present

## 2020-08-30 DIAGNOSIS — C50212 Malignant neoplasm of upper-inner quadrant of left female breast: Secondary | ICD-10-CM

## 2020-08-30 DIAGNOSIS — M25561 Pain in right knee: Secondary | ICD-10-CM | POA: Diagnosis not present

## 2020-08-30 DIAGNOSIS — Z170 Estrogen receptor positive status [ER+]: Secondary | ICD-10-CM | POA: Diagnosis not present

## 2020-08-30 MED ORDER — TAMOXIFEN CITRATE 10 MG PO TABS: 10.0000 mg | ORAL_TABLET | Freq: Every day | ORAL | 3 refills | Status: DC

## 2020-08-30 NOTE — Assessment & Plan Note (Signed)
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   Because she has several family members with prostate cancer, we performed genetic testing: Results were negative for deleterious mutations  Breast cancer surveillance: 1.  Breast exam 08/30/2020: Benign, palpable nodularity at the site of surgery 2.  Mammogram and ultrasound 06/21/2020: Stable postoperative seroma.  No evidence of malignancy.  Breast density category B  Return to clinicin1 yr for follow-up

## 2020-09-01 ENCOUNTER — Other Ambulatory Visit: Payer: Self-pay

## 2020-09-01 ENCOUNTER — Ambulatory Visit (INDEPENDENT_AMBULATORY_CARE_PROVIDER_SITE_OTHER): Payer: Medicare PPO

## 2020-09-01 DIAGNOSIS — Z23 Encounter for immunization: Secondary | ICD-10-CM | POA: Diagnosis not present

## 2020-09-06 DIAGNOSIS — M170 Bilateral primary osteoarthritis of knee: Secondary | ICD-10-CM | POA: Diagnosis not present

## 2020-10-11 ENCOUNTER — Telehealth: Payer: Self-pay | Admitting: Family Medicine

## 2020-10-11 NOTE — Telephone Encounter (Signed)
Called pt again concerning Prolia. I have contacted Shannon Jensen multiple times and she is putting off starting Prolia. Medication has been approved thru her insurance with a estimated cost of $0. She has told me that she is under the care of Dr. Gladstone Lighter and he is putting jell in he knee and she wanted to check with him. I called Shannon Jensen back today and she told me she Dr. Gladstone Lighter said it was ok but she was not ready to start. She states that she will call be back when she's ready. I wanted Dr. Redmond School to be aware of information and that pt has not scheduled to start Prolia.

## 2020-10-11 NOTE — Telephone Encounter (Signed)
Pt called back and would like to speak to [facility] concerning medication. Please call pt [phone].

## 2020-10-18 DIAGNOSIS — M5459 Other low back pain: Secondary | ICD-10-CM | POA: Diagnosis not present

## 2020-10-19 NOTE — Telephone Encounter (Signed)
As far as I know she wants to continue on it.

## 2020-10-19 NOTE — Telephone Encounter (Signed)
Dr. Redmond School what was pt's decision concerning prolia? Does she want to proceed?

## 2020-10-28 DIAGNOSIS — M5416 Radiculopathy, lumbar region: Secondary | ICD-10-CM | POA: Diagnosis not present

## 2020-10-28 NOTE — Telephone Encounter (Signed)
Left message again for pt to call back concerning Prolia. Will place info on hold file until she returns my call.

## 2020-11-18 DIAGNOSIS — M5416 Radiculopathy, lumbar region: Secondary | ICD-10-CM | POA: Diagnosis not present

## 2020-11-22 DIAGNOSIS — M5416 Radiculopathy, lumbar region: Secondary | ICD-10-CM | POA: Diagnosis not present

## 2020-11-29 DIAGNOSIS — M5416 Radiculopathy, lumbar region: Secondary | ICD-10-CM | POA: Diagnosis not present

## 2020-12-02 IMAGING — US US THYROID
1 series · 12 of 25 positions shown · non-contrast
Comparison: 07/04/2012

CLINICAL DATA: Thyromegaly. Previous FNA biopsy of mid left nodule
07/31/2012.

EXAM:
THYROID ULTRASOUND
TECHNIQUE: Ultrasound examination of the thyroid gland and adjacent soft
tissues was performed.

[Series 1: us thyroid · 0.03mm/px · 12 of 70 slices shown]
[im 3/70]
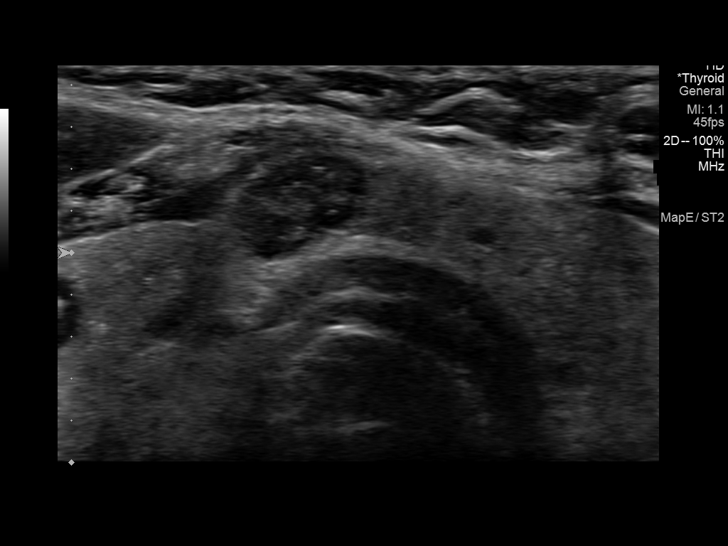
[im 9/70]
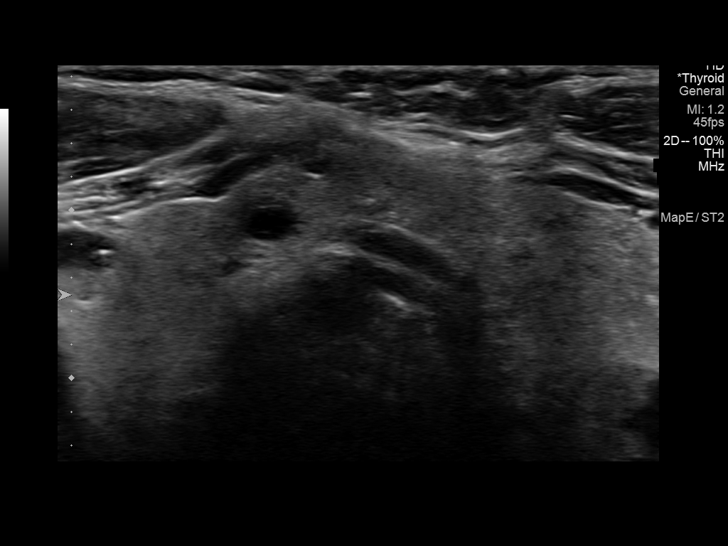
[im 15/70]
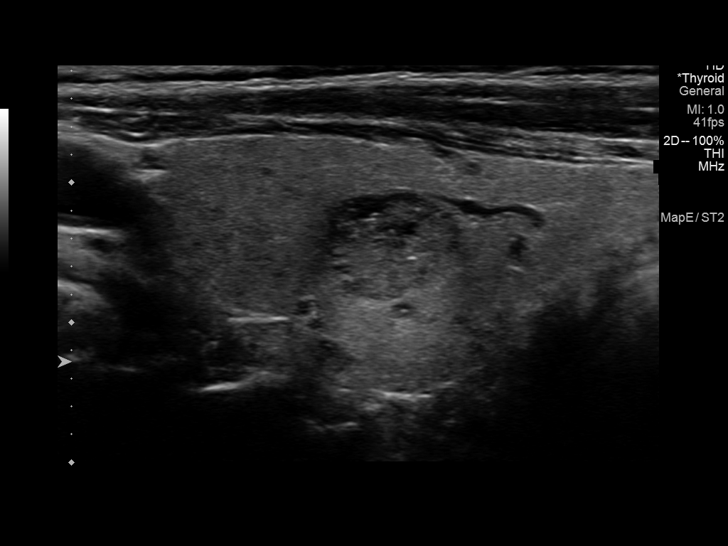
[im 21/70]
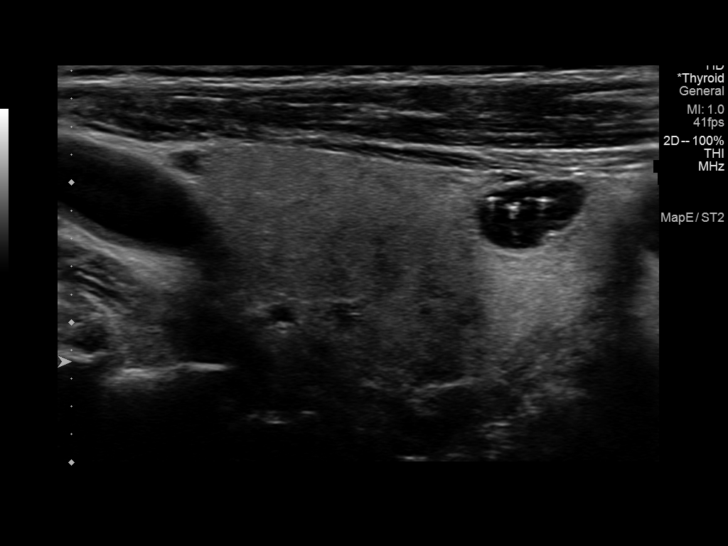
[im 26/70]
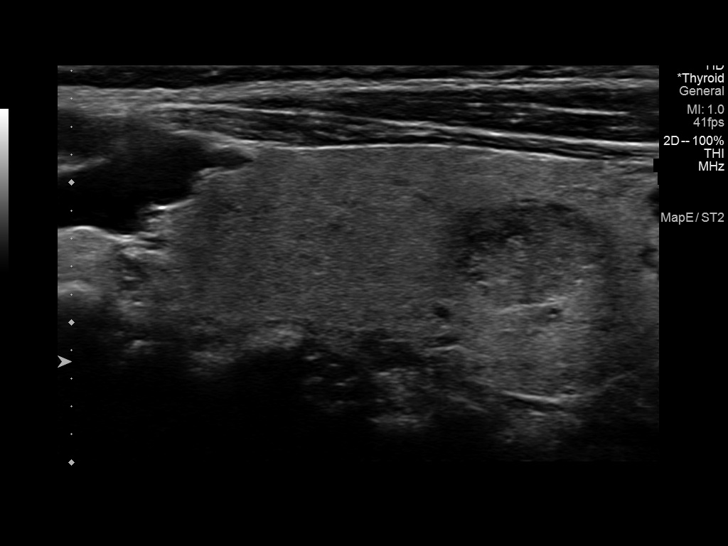
[im 32/70]
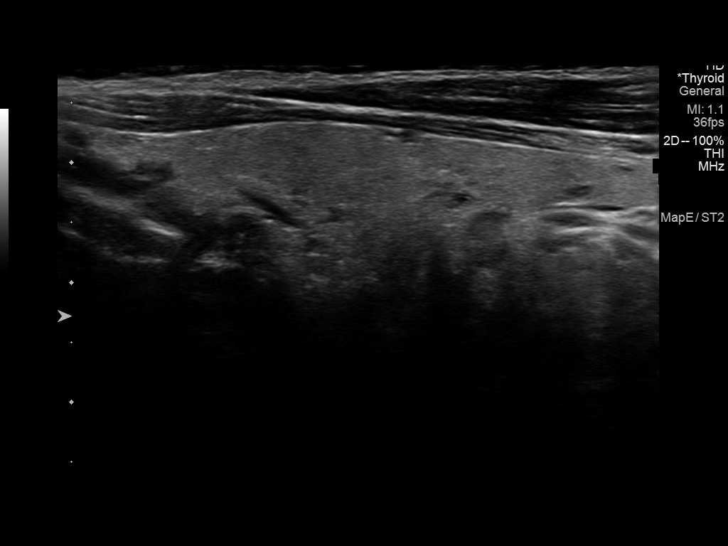
[im 38/70]
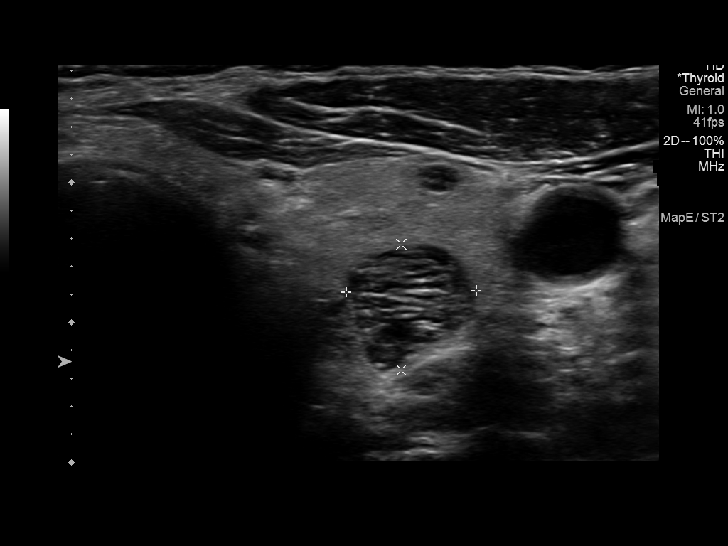
[im 44/70]
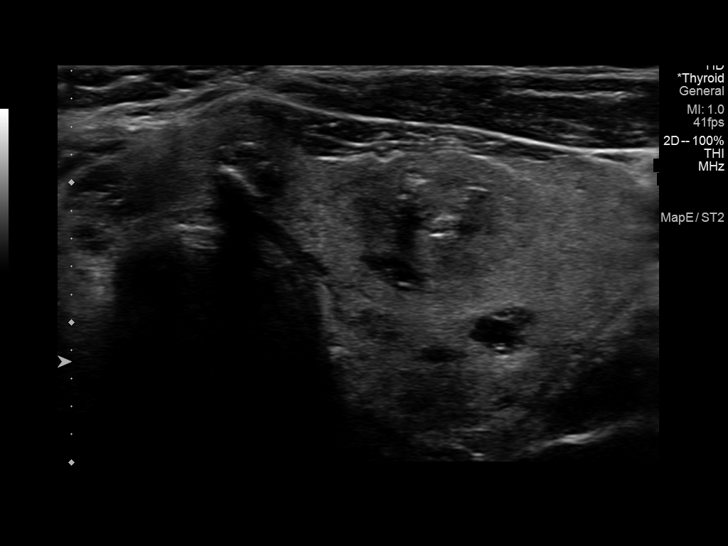
[im 49/70]
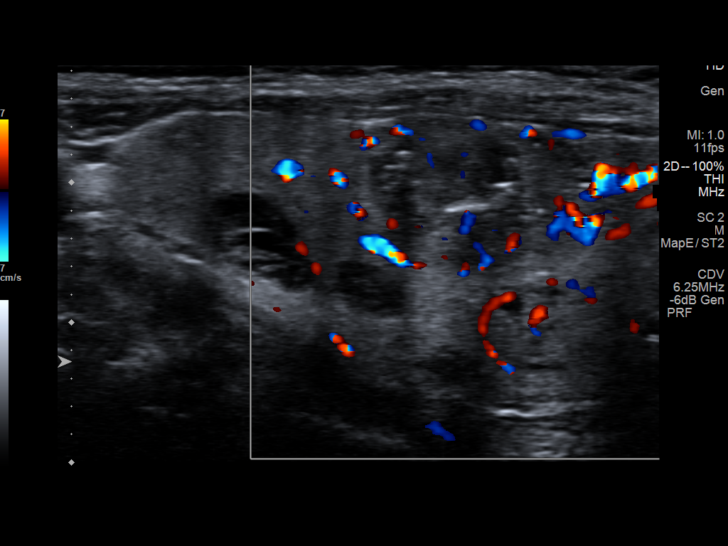
[im 55/70]
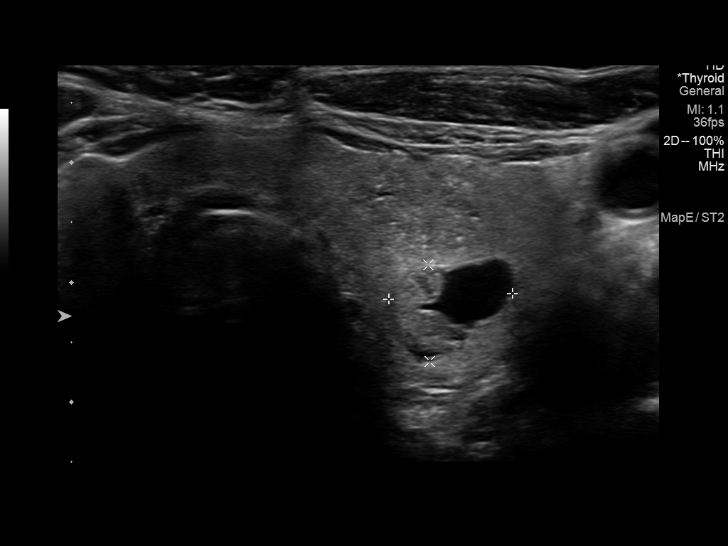
[im 61/70]
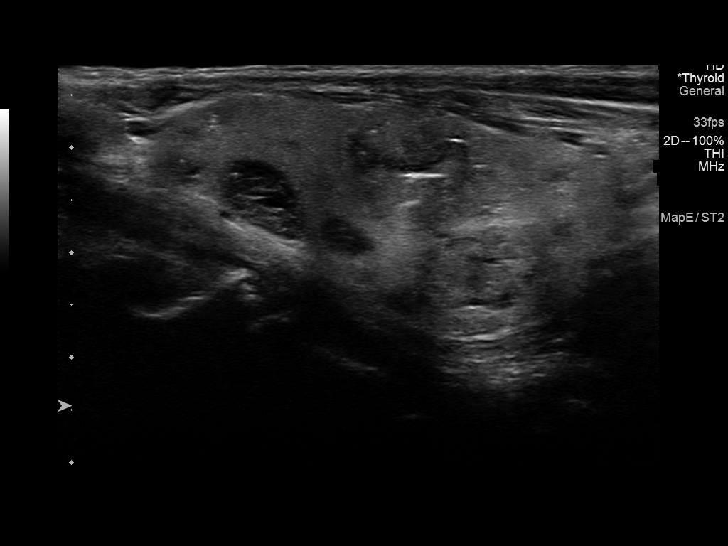
[im 67/70]
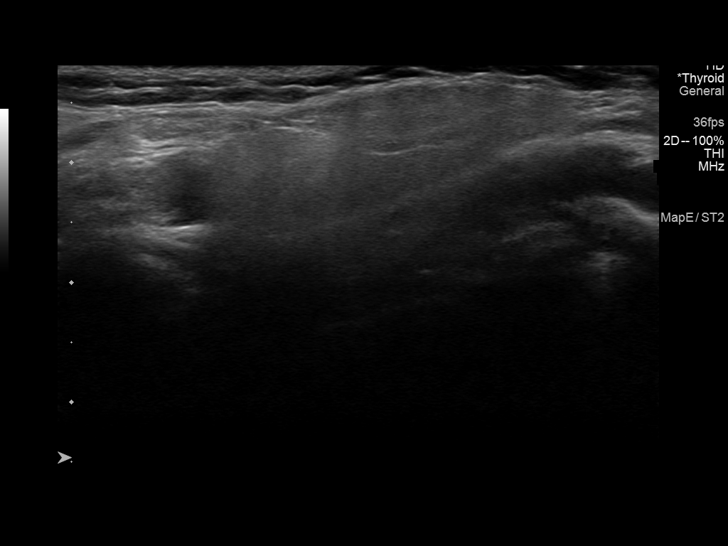

[12 of 25 positions shown; findings below may reference images not displayed]

FINDINGS: Parenchymal Echotexture: Mildly heterogenous

Isthmus: 0.4 cm thickness, stable

Right lobe: 4.4 x 1.7 x 2 cm, previously 4.4 x 1.9 x

Left lobe: 4.5 x 2.1 x 2.5 cm, previously 4.2 x 2.2 x

_________________________________________________________

Estimated total number of nodules >/= 1 cm: 4

Number of spongiform nodules >/=  2 cm not described below (TR1): 0

Number of mixed cystic and solid nodules >/= 1.5 cm not described
below (TR2): 0

_________________________________________________________

0.6 cm hypoechoic right isthmic nodule without calcifications,
previously 1.1 cm; stability for greater than 5 years implies
benignity; This nodule does NOT meet TI-RADS criteria for biopsy or
dedicated follow-up.

1 x 0.8 x 0.8 cm mid right nodule, previously 1.3 x 1.8 x 0.8;
stability for greater than 5 years implies benignity; This nodule
does NOT meet TI-RADS criteria for biopsy or dedicated follow-up.

0.8 cm benign colloid cyst, inferior right, previously

_________________________________________________________

Nodule # 4:

Prior biopsy: No

Location: Left; Superior

Maximum size: 1.2 cm; Other 2 dimensions: 0.9 x 0.9 cm, previously,
0.9 x 0.6 x 0.7 cm

Composition: spongiform (0)

Echogenicity: hypoechoic (2)

Shape: not taller-than-wide (0)

Margins: smooth (0)

Echogenic foci: none (0)

ACR TI-RADS total points: 2.

ACR TI-RADS risk category:  TR2 (2 points).

Significant change in size (>/= 20% in two dimensions and minimal
increase of 2 mm): Yes

Change in features: Yes

Change in ACR TI-RADS risk category: Yes

ACR TI-RADS recommendations:

This nodule does NOT meet TI-RADS criteria for biopsy or dedicated
follow-up.

_________________________________________________________

1.9 x 1.5 x 1.1 cm mid left nodule, previously 2.1 x 1.3 x 1.2;
stability for greater than 5 years implies benignity; this was
previously biopsied

Nodule # 6:

Prior biopsy: No

Location: Left; Inferior

Maximum size: 1.2 cm; Other 2 dimensions: 1 x 0.8 cm, previously,
1.1 x 0.9 x 0.7 cm

Composition: mixed cystic and solid (1)

Echogenicity: isoechoic (1)

Shape: not taller-than-wide (0)

Margins: ill-defined (0)

Echogenic foci: none (0)

ACR TI-RADS total points: 2.

ACR TI-RADS risk category:  TR2 (2 points).

Significant change in size (>/= 20% in two dimensions and minimal
increase of 2 mm): No

Change in features: No

Change in ACR TI-RADS risk category: No

ACR TI-RADS recommendations:

This nodule does NOT meet TI-RADS criteria for biopsy or dedicated
follow-up.

_________________________________________________________
IMPRESSION: 1. Stable thyroid with bilateral nodules. None currently meets
criteria for biopsy or dedicated imaging follow-up.

The above is in keeping with the ACR TI-RADS recommendations - [HOSPITAL] 9539;[DATE].

## 2020-12-09 DIAGNOSIS — M5416 Radiculopathy, lumbar region: Secondary | ICD-10-CM | POA: Diagnosis not present

## 2020-12-09 DIAGNOSIS — M1711 Unilateral primary osteoarthritis, right knee: Secondary | ICD-10-CM | POA: Diagnosis not present

## 2020-12-09 DIAGNOSIS — M25561 Pain in right knee: Secondary | ICD-10-CM | POA: Diagnosis not present

## 2020-12-09 DIAGNOSIS — M25562 Pain in left knee: Secondary | ICD-10-CM | POA: Diagnosis not present

## 2020-12-12 DIAGNOSIS — M25562 Pain in left knee: Secondary | ICD-10-CM | POA: Diagnosis not present

## 2020-12-12 DIAGNOSIS — M1711 Unilateral primary osteoarthritis, right knee: Secondary | ICD-10-CM | POA: Diagnosis not present

## 2020-12-12 DIAGNOSIS — M25561 Pain in right knee: Secondary | ICD-10-CM | POA: Diagnosis not present

## 2020-12-12 DIAGNOSIS — M5416 Radiculopathy, lumbar region: Secondary | ICD-10-CM | POA: Diagnosis not present

## 2020-12-15 DIAGNOSIS — M5416 Radiculopathy, lumbar region: Secondary | ICD-10-CM | POA: Diagnosis not present

## 2020-12-15 DIAGNOSIS — M25562 Pain in left knee: Secondary | ICD-10-CM | POA: Diagnosis not present

## 2020-12-15 DIAGNOSIS — M1711 Unilateral primary osteoarthritis, right knee: Secondary | ICD-10-CM | POA: Diagnosis not present

## 2020-12-15 DIAGNOSIS — M25561 Pain in right knee: Secondary | ICD-10-CM | POA: Diagnosis not present

## 2020-12-22 DIAGNOSIS — M25561 Pain in right knee: Secondary | ICD-10-CM | POA: Diagnosis not present

## 2020-12-22 DIAGNOSIS — M1711 Unilateral primary osteoarthritis, right knee: Secondary | ICD-10-CM | POA: Diagnosis not present

## 2020-12-22 DIAGNOSIS — M5416 Radiculopathy, lumbar region: Secondary | ICD-10-CM | POA: Diagnosis not present

## 2020-12-22 DIAGNOSIS — M25562 Pain in left knee: Secondary | ICD-10-CM | POA: Diagnosis not present

## 2020-12-26 DIAGNOSIS — M25561 Pain in right knee: Secondary | ICD-10-CM | POA: Diagnosis not present

## 2020-12-26 DIAGNOSIS — M5416 Radiculopathy, lumbar region: Secondary | ICD-10-CM | POA: Diagnosis not present

## 2020-12-26 DIAGNOSIS — M25562 Pain in left knee: Secondary | ICD-10-CM | POA: Diagnosis not present

## 2020-12-26 DIAGNOSIS — M1711 Unilateral primary osteoarthritis, right knee: Secondary | ICD-10-CM | POA: Diagnosis not present

## 2020-12-28 DIAGNOSIS — M25561 Pain in right knee: Secondary | ICD-10-CM | POA: Diagnosis not present

## 2020-12-28 DIAGNOSIS — M1711 Unilateral primary osteoarthritis, right knee: Secondary | ICD-10-CM | POA: Diagnosis not present

## 2020-12-28 DIAGNOSIS — M5416 Radiculopathy, lumbar region: Secondary | ICD-10-CM | POA: Diagnosis not present

## 2020-12-28 DIAGNOSIS — M25562 Pain in left knee: Secondary | ICD-10-CM | POA: Diagnosis not present

## 2021-01-03 DIAGNOSIS — M5416 Radiculopathy, lumbar region: Secondary | ICD-10-CM | POA: Diagnosis not present

## 2021-01-06 DIAGNOSIS — M5416 Radiculopathy, lumbar region: Secondary | ICD-10-CM | POA: Diagnosis not present

## 2021-01-10 DIAGNOSIS — M5416 Radiculopathy, lumbar region: Secondary | ICD-10-CM | POA: Diagnosis not present

## 2021-01-13 DIAGNOSIS — I10 Essential (primary) hypertension: Secondary | ICD-10-CM | POA: Diagnosis not present

## 2021-01-13 DIAGNOSIS — H401132 Primary open-angle glaucoma, bilateral, moderate stage: Secondary | ICD-10-CM | POA: Diagnosis not present

## 2021-01-13 DIAGNOSIS — M5416 Radiculopathy, lumbar region: Secondary | ICD-10-CM | POA: Diagnosis not present

## 2021-01-13 DIAGNOSIS — H04123 Dry eye syndrome of bilateral lacrimal glands: Secondary | ICD-10-CM | POA: Diagnosis not present

## 2021-01-17 DIAGNOSIS — M5416 Radiculopathy, lumbar region: Secondary | ICD-10-CM | POA: Diagnosis not present

## 2021-01-20 DIAGNOSIS — M5416 Radiculopathy, lumbar region: Secondary | ICD-10-CM | POA: Diagnosis not present

## 2021-01-27 DIAGNOSIS — M5416 Radiculopathy, lumbar region: Secondary | ICD-10-CM | POA: Diagnosis not present

## 2021-01-30 DIAGNOSIS — M5416 Radiculopathy, lumbar region: Secondary | ICD-10-CM | POA: Diagnosis not present

## 2021-02-01 DIAGNOSIS — M5416 Radiculopathy, lumbar region: Secondary | ICD-10-CM | POA: Diagnosis not present

## 2021-02-17 DIAGNOSIS — M5416 Radiculopathy, lumbar region: Secondary | ICD-10-CM | POA: Diagnosis not present

## 2021-02-20 DIAGNOSIS — M5416 Radiculopathy, lumbar region: Secondary | ICD-10-CM | POA: Diagnosis not present

## 2021-02-22 DIAGNOSIS — M5416 Radiculopathy, lumbar region: Secondary | ICD-10-CM | POA: Diagnosis not present

## 2021-02-27 DIAGNOSIS — M5416 Radiculopathy, lumbar region: Secondary | ICD-10-CM | POA: Diagnosis not present

## 2021-03-01 DIAGNOSIS — M5416 Radiculopathy, lumbar region: Secondary | ICD-10-CM | POA: Diagnosis not present

## 2021-03-06 DIAGNOSIS — M5416 Radiculopathy, lumbar region: Secondary | ICD-10-CM | POA: Diagnosis not present

## 2021-03-09 DIAGNOSIS — M5416 Radiculopathy, lumbar region: Secondary | ICD-10-CM | POA: Diagnosis not present

## 2021-03-21 ENCOUNTER — Encounter: Payer: Self-pay | Admitting: Genetic Counselor

## 2021-04-03 DIAGNOSIS — Z170 Estrogen receptor positive status [ER+]: Secondary | ICD-10-CM | POA: Diagnosis not present

## 2021-04-03 DIAGNOSIS — C50212 Malignant neoplasm of upper-inner quadrant of left female breast: Secondary | ICD-10-CM | POA: Diagnosis not present

## 2021-05-16 DIAGNOSIS — M1712 Unilateral primary osteoarthritis, left knee: Secondary | ICD-10-CM | POA: Diagnosis not present

## 2021-05-16 DIAGNOSIS — M1711 Unilateral primary osteoarthritis, right knee: Secondary | ICD-10-CM | POA: Diagnosis not present

## 2021-05-16 DIAGNOSIS — M545 Low back pain, unspecified: Secondary | ICD-10-CM | POA: Diagnosis not present

## 2021-05-30 DIAGNOSIS — H6121 Impacted cerumen, right ear: Secondary | ICD-10-CM | POA: Diagnosis not present

## 2021-06-01 ENCOUNTER — Other Ambulatory Visit: Payer: Self-pay | Admitting: Hematology and Oncology

## 2021-06-01 DIAGNOSIS — R928 Other abnormal and inconclusive findings on diagnostic imaging of breast: Secondary | ICD-10-CM

## 2021-06-10 DIAGNOSIS — I2699 Other pulmonary embolism without acute cor pulmonale: Secondary | ICD-10-CM | POA: Insufficient documentation

## 2021-06-10 HISTORY — DX: Other pulmonary embolism without acute cor pulmonale: I26.99

## 2021-06-12 ENCOUNTER — Other Ambulatory Visit: Payer: Self-pay

## 2021-06-12 ENCOUNTER — Ambulatory Visit: Payer: Medicare PPO | Admitting: Family Medicine

## 2021-06-12 ENCOUNTER — Encounter (HOSPITAL_COMMUNITY): Payer: Self-pay | Admitting: Emergency Medicine

## 2021-06-12 ENCOUNTER — Encounter: Payer: Self-pay | Admitting: Family Medicine

## 2021-06-12 ENCOUNTER — Observation Stay (HOSPITAL_COMMUNITY)
Admission: EM | Admit: 2021-06-12 | Discharge: 2021-06-16 | Disposition: A | Discharge status: Home / Self Care | Payer: Medicare PPO | Attending: Emergency Medicine | Admitting: Emergency Medicine

## 2021-06-12 ENCOUNTER — Emergency Department (HOSPITAL_COMMUNITY): Payer: Medicare PPO

## 2021-06-12 VITALS — BP 112/62 | HR 97 | Temp 98.5°F | Wt 149.0 lb

## 2021-06-12 DIAGNOSIS — R002 Palpitations: Secondary | ICD-10-CM

## 2021-06-12 DIAGNOSIS — R079 Chest pain, unspecified: Secondary | ICD-10-CM | POA: Diagnosis present

## 2021-06-12 DIAGNOSIS — I2692 Saddle embolus of pulmonary artery without acute cor pulmonale: Secondary | ICD-10-CM

## 2021-06-12 DIAGNOSIS — E871 Hypo-osmolality and hyponatremia: Secondary | ICD-10-CM | POA: Insufficient documentation

## 2021-06-12 DIAGNOSIS — R000 Tachycardia, unspecified: Secondary | ICD-10-CM | POA: Diagnosis not present

## 2021-06-12 DIAGNOSIS — M6281 Muscle weakness (generalized): Secondary | ICD-10-CM | POA: Diagnosis not present

## 2021-06-12 DIAGNOSIS — I318 Other specified diseases of pericardium: Secondary | ICD-10-CM | POA: Insufficient documentation

## 2021-06-12 DIAGNOSIS — Z20822 Contact with and (suspected) exposure to covid-19: Secondary | ICD-10-CM | POA: Insufficient documentation

## 2021-06-12 DIAGNOSIS — C50212 Malignant neoplasm of upper-inner quadrant of left female breast: Secondary | ICD-10-CM

## 2021-06-12 DIAGNOSIS — I2699 Other pulmonary embolism without acute cor pulmonale: Secondary | ICD-10-CM | POA: Diagnosis not present

## 2021-06-12 DIAGNOSIS — R0600 Dyspnea, unspecified: Secondary | ICD-10-CM

## 2021-06-12 DIAGNOSIS — Z853 Personal history of malignant neoplasm of breast: Secondary | ICD-10-CM

## 2021-06-12 DIAGNOSIS — I10 Essential (primary) hypertension: Secondary | ICD-10-CM | POA: Diagnosis not present

## 2021-06-12 DIAGNOSIS — Z79899 Other long term (current) drug therapy: Secondary | ICD-10-CM | POA: Diagnosis not present

## 2021-06-12 DIAGNOSIS — Z170 Estrogen receptor positive status [ER+]: Secondary | ICD-10-CM

## 2021-06-12 DIAGNOSIS — R0602 Shortness of breath: Secondary | ICD-10-CM | POA: Diagnosis not present

## 2021-06-12 LAB — BASIC METABOLIC PANEL
Anion gap: 13 (ref 5–15)
BUN: 23 mg/dL (ref 8–23)
CO2: 19 mmol/L — ABNORMAL LOW (ref 22–32)
Calcium: 9.2 mg/dL (ref 8.9–10.3)
Chloride: 102 mmol/L (ref 98–111)
Creatinine, Ser: 1.31 mg/dL — ABNORMAL HIGH (ref 0.44–1.00)
GFR, Estimated: 39 mL/min — ABNORMAL LOW (ref >60)
Glucose, Bld: 102 mg/dL — ABNORMAL HIGH (ref 70–99)
Potassium: 4.2 mmol/L (ref 3.5–5.1)
Sodium: 134 mmol/L — ABNORMAL LOW (ref 135–145)

## 2021-06-12 LAB — TSH: TSH: 1.422 u[IU]/mL (ref 0.350–4.500)

## 2021-06-12 LAB — CBC
HCT: 35 % — ABNORMAL LOW (ref 36.0–46.0)
Hemoglobin: 12.1 g/dL (ref 12.0–15.0)
MCH: 31.7 pg (ref 26.0–34.0)
MCHC: 34.6 g/dL (ref 30.0–36.0)
MCV: 91.6 fL (ref 80.0–100.0)
Platelets: 157 10*3/uL (ref 150–400)
RBC: 3.82 MIL/uL — ABNORMAL LOW (ref 3.87–5.11)
RDW: 14.8 % (ref 11.5–15.5)
WBC: 8.9 10*3/uL (ref 4.0–10.5)
nRBC: 0 % (ref 0.0–0.2)

## 2021-06-12 LAB — TROPONIN I (HIGH SENSITIVITY): Troponin I (High Sensitivity): 68 ng/L — ABNORMAL HIGH (ref <18)

## 2021-06-12 NOTE — ED Provider Notes (Signed)
Emergency Medicine Provider Triage Evaluation Note  Shannon Jensen , a 87 y.o. female  was evaluated in triage.  Pt complains of shortness of breath.  Symptoms started yesterday and have been intermittent.  She saw her primary care doctor today who ordered labs and a referral to cardiology.  They are concerned that maybe she was having episodic tachycardia, however they were unable to capture this on EKG at the office.  Patient's symptoms persisted tonight so she was brought to the emergency department by family.  No fevers or cough.  No abdominal pain.  She denies chest pain or chest tightness.  Review of Systems  Positive: Shortness of breath Negative: Chest pain  Physical Exam  Ht 5\' 3"  (1.6 m)   Wt 67.5 kg   BMI 26.36 kg/m  Gen:   Awake, no distress   Resp:  Normal effort, clear to auscultation bilaterally MSK:   Moves extremities without difficulty, no lower extremity edema Other:  Mild tachycardia in the 100s, rhythm seems regular  Medical Decision Making  Medically screening exam initiated at 9:53 PM.  Appropriate orders placed.  Shannon Jensen was informed that the remainder of the evaluation will be completed by another provider, this initial triage assessment does not replace that evaluation, and the importance of remaining in the ED until their evaluation is complete.     Carlisle Cater, PA-C 06/12/21 2155    Lucrezia Starch, MD 06/12/21 2234

## 2021-06-12 NOTE — ED Triage Notes (Signed)
Patient here with shortness of breath.  She states that it started yesterday.   She went to PCP today, who stated she had a cardiac problem and she should probably come to the ED to see a cardiologist.  She denies any chest pain at this time.  HR is tachy but she is regular.

## 2021-06-12 NOTE — Progress Notes (Signed)
   Subjective:    Patient ID: Shannon Jensen, female    DOB: 1928/06/09, 87 y.o.   MRN: [id number]  HPI Yesterday after church she had sudden onset of shortness of breath and some slight dyspnea on exertion.  No chest pain or diaphoresis.  She has never had this before.  It apparently did wax and wane.  No previous history of difficulty with this.  No fever, chills, sore throat or coughing.   Review of Systems     Objective:   Physical Exam Alert and in no distress.  Normal respiratory pattern noted.  Initial cardiac exam showed a rate of above 150 however the EKG done several minutes later showed a rate of 99 with other parameters being normal.       Assessment & Plan:  Tachycardia - Plan: EKG 12-Lead, CBC with Differential/Platelet, Comprehensive metabolic panel, Lipid panel, TSH, Ambulatory referral to Cardiology She was definitely in Tachycardia however I could not get a rhythm strip on her during that same timeframe.  It could be PSVT or possibly PAF.  I will refer to cardiology for further evaluation.  Explained that if this reoccurs, take her to the emergency room so we can catch an accurate rhythm strip.

## 2021-06-13 ENCOUNTER — Observation Stay (HOSPITAL_COMMUNITY): Payer: Medicare PPO

## 2021-06-13 DIAGNOSIS — R0602 Shortness of breath: Secondary | ICD-10-CM | POA: Diagnosis not present

## 2021-06-13 DIAGNOSIS — I10 Essential (primary) hypertension: Secondary | ICD-10-CM

## 2021-06-13 DIAGNOSIS — R079 Chest pain, unspecified: Secondary | ICD-10-CM | POA: Diagnosis not present

## 2021-06-13 DIAGNOSIS — R0600 Dyspnea, unspecified: Secondary | ICD-10-CM

## 2021-06-13 DIAGNOSIS — R002 Palpitations: Secondary | ICD-10-CM | POA: Diagnosis not present

## 2021-06-13 DIAGNOSIS — I2692 Saddle embolus of pulmonary artery without acute cor pulmonale: Secondary | ICD-10-CM | POA: Diagnosis not present

## 2021-06-13 DIAGNOSIS — I2694 Multiple subsegmental pulmonary emboli without acute cor pulmonale: Secondary | ICD-10-CM | POA: Diagnosis not present

## 2021-06-13 DIAGNOSIS — I2699 Other pulmonary embolism without acute cor pulmonale: Secondary | ICD-10-CM | POA: Diagnosis not present

## 2021-06-13 DIAGNOSIS — Z86711 Personal history of pulmonary embolism: Secondary | ICD-10-CM | POA: Insufficient documentation

## 2021-06-13 DIAGNOSIS — I471 Supraventricular tachycardia: Secondary | ICD-10-CM | POA: Diagnosis not present

## 2021-06-13 LAB — CBC WITH DIFFERENTIAL/PLATELET
Basophils Absolute: 0 10*3/uL (ref 0.0–0.2)
Basos: 0 %
EOS (ABSOLUTE): 0.3 10*3/uL (ref 0.0–0.4)
Eos: 3 %
Hematocrit: 37.3 % (ref 34.0–46.6)
Hemoglobin: 12.3 g/dL (ref 11.1–15.9)
Immature Grans (Abs): 0 10*3/uL (ref 0.0–0.1)
Immature Granulocytes: 0 %
Lymphocytes Absolute: 2.7 10*3/uL (ref 0.7–3.1)
Lymphs: 29 %
MCH: 30.7 pg (ref 26.6–33.0)
MCHC: 33 g/dL (ref 31.5–35.7)
MCV: 93 fL (ref 79–97)
Monocytes Absolute: 0.9 10*3/uL (ref 0.1–0.9)
Monocytes: 10 %
Neutrophils Absolute: 5.5 10*3/uL (ref 1.4–7.0)
Neutrophils: 58 %
Platelets: 173 10*3/uL (ref 150–450)
RBC: 4.01 x10E6/uL (ref 3.77–5.28)
RDW: 14.5 % (ref 11.7–15.4)
WBC: 9.5 10*3/uL (ref 3.4–10.8)

## 2021-06-13 LAB — COMPREHENSIVE METABOLIC PANEL
ALT: 16 IU/L (ref 0–32)
AST: 25 IU/L (ref 0–40)
Albumin/Globulin Ratio: 1.3 (ref 1.2–2.2)
Albumin: 3.9 g/dL (ref 3.5–4.6)
Alkaline Phosphatase: 41 IU/L — ABNORMAL LOW (ref 44–121)
BUN/Creatinine Ratio: 16 (ref 12–28)
BUN: 19 mg/dL (ref 10–36)
Bilirubin Total: 0.6 mg/dL (ref 0.0–1.2)
CO2: 19 mmol/L — ABNORMAL LOW (ref 20–29)
Calcium: 9.4 mg/dL (ref 8.7–10.3)
Chloride: 98 mmol/L (ref 96–106)
Creatinine, Ser: 1.2 mg/dL — ABNORMAL HIGH (ref 0.57–1.00)
Globulin, Total: 3.1 g/dL (ref 1.5–4.5)
Glucose: 89 mg/dL (ref 70–99)
Potassium: 4.6 mmol/L (ref 3.5–5.2)
Sodium: 136 mmol/L (ref 134–144)
Total Protein: 7 g/dL (ref 6.0–8.5)
eGFR: 43 mL/min/{1.73_m2} — ABNORMAL LOW (ref >59)

## 2021-06-13 LAB — PROTIME-INR
INR: 1.4 — ABNORMAL HIGH (ref 0.8–1.2)
Prothrombin Time: 16.7 seconds — ABNORMAL HIGH (ref 11.4–15.2)

## 2021-06-13 LAB — TROPONIN I (HIGH SENSITIVITY)
Troponin I (High Sensitivity): 43 ng/L — ABNORMAL HIGH (ref <18)
Troponin I (High Sensitivity): 49 ng/L — ABNORMAL HIGH (ref <18)
Troponin I (High Sensitivity): 63 ng/L — ABNORMAL HIGH (ref <18)

## 2021-06-13 LAB — LIPID PANEL
Chol/HDL Ratio: 5.7 ratio — ABNORMAL HIGH (ref 0.0–4.4)
Cholesterol, Total: 211 mg/dL — ABNORMAL HIGH (ref 100–199)
HDL: 37 mg/dL — ABNORMAL LOW (ref >39)
LDL Chol Calc (NIH): 150 mg/dL — ABNORMAL HIGH (ref 0–99)
Triglycerides: 130 mg/dL (ref 0–149)
VLDL Cholesterol Cal: 24 mg/dL (ref 5–40)

## 2021-06-13 LAB — D-DIMER, QUANTITATIVE: D-Dimer, Quant: >20 ug/mL-FEU — ABNORMAL HIGH (ref 0.00–0.50)

## 2021-06-13 LAB — APTT: aPTT: 88 seconds — ABNORMAL HIGH (ref 24–36)

## 2021-06-13 LAB — TSH: TSH: 2.06 u[IU]/mL (ref 0.450–4.500)

## 2021-06-13 MED ORDER — METOPROLOL TARTRATE 5 MG/5ML IV SOLN
2.5000 mg | Freq: Once | INTRAVENOUS | Status: AC
  Administered 2021-06-13: 2.5 mg via INTRAVENOUS
  Filled 2021-06-13: qty 5

## 2021-06-13 MED ORDER — WOMENS ONE DAILY PO TABS: 1.0000 | ORAL_TABLET | Freq: Every day | ORAL | Status: DC

## 2021-06-13 MED ORDER — ONDANSETRON HCL 4 MG PO TABS: 4.0000 mg | ORAL_TABLET | Freq: Four times a day (QID) | ORAL | Status: DC | PRN

## 2021-06-13 MED ORDER — ACETAMINOPHEN 325 MG PO TABS
650.0000 mg | ORAL_TABLET | Freq: Four times a day (QID) | ORAL | Status: DC | PRN
  Administered 2021-06-14: 650 mg via ORAL
  Filled 2021-06-13 (×2): qty 2

## 2021-06-13 MED ORDER — ONDANSETRON HCL 4 MG/2ML IJ SOLN: 4.0000 mg | Freq: Four times a day (QID) | INTRAMUSCULAR | Status: DC | PRN

## 2021-06-13 MED ORDER — TAMOXIFEN CITRATE 10 MG PO TABS
10.0000 mg | ORAL_TABLET | Freq: Every day | ORAL | Status: DC
  Administered 2021-06-13 – 2021-06-16 (×4): 10 mg via ORAL
  Filled 2021-06-13 (×4): qty 1

## 2021-06-13 MED ORDER — HEPARIN BOLUS VIA INFUSION
3000.0000 [IU] | Freq: Once | INTRAVENOUS | Status: AC
  Administered 2021-06-13: 3000 [IU] via INTRAVENOUS
  Filled 2021-06-13: qty 3000

## 2021-06-13 MED ORDER — ADULT MULTIVITAMIN W/MINERALS CH
1.0000 | ORAL_TABLET | Freq: Every day | ORAL | Status: DC
  Administered 2021-06-13 – 2021-06-16 (×4): 1 via ORAL
  Filled 2021-06-13 (×4): qty 1

## 2021-06-13 MED ORDER — METOPROLOL TARTRATE 25 MG PO TABS
25.0000 mg | ORAL_TABLET | Freq: Two times a day (BID) | ORAL | Status: DC
  Administered 2021-06-13 – 2021-06-16 (×6): 25 mg via ORAL
  Filled 2021-06-13 (×7): qty 1

## 2021-06-13 MED ORDER — VITAMIN D 25 MCG (1000 UNIT) PO TABS
2000.0000 [IU] | ORAL_TABLET | Freq: Every day | ORAL | Status: DC
  Administered 2021-06-13 – 2021-06-16 (×4): 2000 [IU] via ORAL
  Filled 2021-06-13 (×4): qty 2

## 2021-06-13 MED ORDER — IOHEXOL 350 MG/ML SOLN
50.0000 mL | Freq: Once | INTRAVENOUS | Status: AC | PRN
  Administered 2021-06-13: 50 mL via INTRAVENOUS

## 2021-06-13 MED ORDER — HEPARIN (PORCINE) 25000 UT/250ML-% IV SOLN
950.0000 [IU]/h | INTRAVENOUS | Status: DC
  Administered 2021-06-13: 900 [IU]/h via INTRAVENOUS
  Filled 2021-06-13 (×2): qty 250

## 2021-06-13 MED ORDER — AMLODIPINE BESYLATE 5 MG PO TABS
5.0000 mg | ORAL_TABLET | Freq: Every day | ORAL | Status: DC
  Administered 2021-06-13: 5 mg via ORAL
  Filled 2021-06-13: qty 1

## 2021-06-13 MED ORDER — ENOXAPARIN SODIUM 30 MG/0.3ML IJ SOSY: 30.0000 mg | PREFILLED_SYRINGE | INTRAMUSCULAR | Status: DC

## 2021-06-13 NOTE — ED Provider Notes (Signed)
[facility] EMERGENCY DEPARTMENT Provider Note   CSN: [id number] Arrival date & time: 06/12/21  2143     History Chief Complaint  Patient presents with   Shortness of Breath    Shannon Jensen is a 87 y.o. female.  87 year old female with prior medical history as detailed below presents for evaluation.  Patient is presenting for evaluation of reported transient episodes of dyspnea with associated palpitations.  Patient reports that her symptoms are most significant on Sunday of this past weekend.  She reports episode of significant shortness of breath.  This was associated with feeling like her heart was racing.  She reports associated chest discomfort as well  -however she denies chest pain.  Symptoms improved but did recur on Monday.  Patient was seen by her regular care provider in the outpatient setting yesterday.  This provider was able to capture a brief episode of tachycardia with a rate into the 150s.  Patient was referred to the ED for evaluation.  At the time of my evaluation in the ED she is comfortable.  She denies current chest pain or shortness of breath.  She denies current palpitations.  She denies prior history of cardiac work-up or cardiac disease.  The history is provided by the patient.  Shortness of Breath Severity:  Moderate Onset quality:  Sudden Duration:  2 days Timing:  Intermittent Progression:  Waxing and waning Chronicity:  New Relieved by:  Nothing Worsened by:  Nothing     Past Medical History:  Diagnosis Date   Anxiety and depression    Arthritis    Breast cancer ([place]) 08/2008   Right Breast Cancer   Breast cancer ([place]) 2019   Left Breast Cancer   Chronic headaches    Cystocele    Diverticulosis    Family history of breast cancer    Family history of colon cancer    Family history of pancreatic cancer    Family history of prostate cancer    GERD (gastroesophageal reflux disease)    H/O vitamin D deficiency    HLD  (hyperlipidemia)    Hypertension    Osteoarthritis    Osteoporosis    Pelvic relaxation    Personal history of radiation therapy 2009   Right Breast Cancer   Urinary tract infection 2014    Patient Active Problem List   Diagnosis Date Noted   Enlarged thyroid 06/23/2019   Genetic testing 10/03/2018   Family history of breast cancer    Family history of pancreatic cancer    Family history of prostate cancer    Presbycusis of both ears 08/25/2018   Laryngopharyngeal reflux (LPR) 06/10/2018   Invasive ductal carcinoma of left breast ([place]) 10/09/2017   Malignant neoplasm of upper-inner quadrant of left breast in female, estrogen receptor positive ([place]) 09/24/2017   Internal hemorrhoids 09/23/2014   Family history of colon cancer 06/30/2014   History of colonic polyps 06/30/2014   GERD (gastroesophageal reflux disease) 08/27/2012   Diverticulosis    Osteoporosis    Pelvic relaxation    Cystocele    H/O vitamin D deficiency    Hyperlipidemia LDL goal <130 11/08/2008   Essential hypertension 04/13/2008   Osteoarthritis 04/13/2008    Past Surgical History:  Procedure Laterality Date   [place]   BREAST LUMPECTOMY Right 2009   BREAST LUMPECTOMY Left 2019   BREAST LUMPECTOMY WITH RADIOACTIVE SEED LOCALIZATION Left 11/19/2017   Procedure: BREAST LUMPECTOMY WITH RADIOACTIVE SEED  LOCALIZATION;  Surgeon: Rolm Bookbinder, MD;  Location: [place];  Service: General;  Laterality: Left;   BREAST SURGERY  2009   cancer-lymph node removal   NECK SURGERY     cyst removal     OB History     Gravida  4   Para  4   Term      Preterm      AB      Living  4      SAB      IAB      Ectopic      Multiple      Live Births              Family History  Problem Relation Age of Onset   Prostate cancer Father 37       d. 21   Heart disease Paternal Uncle    Colon cancer Mother        dx 68s-60s   Breast cancer Sister         dx >50   Pancreatic cancer Brother    Prostate cancer Brother    Colon polyps Daughter        x 2, son   Stroke Daughter        x 2   Appendicitis Daughter        appendectomy   Pancreatic cancer Sister 42       d. 41   Prostate cancer Nephew        dx.50s    Social History   Tobacco Use   Smoking status: Never   Smokeless tobacco: Never  Vaping Use   Vaping Use: Never used  Substance Use Topics   Alcohol use: No   Drug use: No    Home Medications Prior to Admission medications   Medication Sig Start Date End Date Taking? Authorizing Provider  acetaminophen (TYLENOL) 325 MG tablet Take 650 mg by mouth every 6 (six) hours as needed for moderate pain or headache.     [provider]  amLODipine-olmesartan (AZOR) 5-40 MG tablet Take 1 tablet by mouth daily. 07/19/20   [name], MD  Artificial Tear Ointment (DRY EYES OP) Place 1-2 drops into both eyes 2 (two) times daily as needed (for dry eyes).    [provider]  Cholecalciferol (VITAMIN D) 2000 units CAPS Take 2,000 Units by mouth daily.    [provider]  diclofenac Sodium (VOLTAREN) 1 % GEL  03/18/20   [provider]  Multiple Vitamins-Minerals (WOMENS ONE DAILY) TABS Take 1 tablet by mouth daily.      [provider]  Omega-3 Fatty Acids (FISH OIL) 1200 MG CAPS Take 1,200 mg by mouth daily.     [provider]  sulfamethoxazole-trimethoprim (BACTRIM DS) 800-160 MG tablet Take 1 tablet by mouth 2 (two) times daily. Patient not taking: Reported on 06/12/2021 07/19/20   [name], MD  tamoxifen (NOLVADEX) 10 MG tablet Take 1 tablet (10 mg total) by mouth daily. 08/30/20   Nicholas Lose, MD    Allergies    Celecoxib, Erythromycin, Iodine, Nabumetone, Nsaids, Oxycodone-acetaminophen, Tyloxapol, and Vioxx [rofecoxib]  Review of Systems   Review of Systems  Respiratory:  Positive for shortness of breath.   All other systems reviewed and are  negative.  Physical Exam Updated Vital Signs BP 126/62   Pulse 96   Temp 98.4 F (36.9 C) (Oral)   Resp 20   Ht 5\' 3"  (1.6 m)  Wt 67.5 kg   SpO2 98%   BMI 26.36 kg/m   Physical Exam Vitals and nursing note reviewed.  Constitutional:      General: She is not in acute distress.    Appearance: Normal appearance. She is well-developed.  HENT:     Head: Normocephalic and atraumatic.  Eyes:     Conjunctiva/sclera: Conjunctivae normal.     Pupils: Pupils are equal, round, and reactive to light.  Cardiovascular:     Rate and Rhythm: Normal rate and regular rhythm.     Heart sounds: Normal heart sounds.  Pulmonary:     Effort: Pulmonary effort is normal. No respiratory distress.     Breath sounds: Normal breath sounds.  Abdominal:     General: There is no distension.     Palpations: Abdomen is soft.     Tenderness: There is no abdominal tenderness.  Musculoskeletal:        General: No deformity. Normal range of motion.     Cervical back: Normal range of motion and neck supple.  Skin:    General: Skin is warm and dry.  Neurological:     General: No focal deficit present.     Mental Status: She is alert and oriented to person, place, and time.    ED Results / Procedures / Treatments   Labs (all labs ordered are listed, but only abnormal results are displayed) Labs Reviewed  BASIC METABOLIC PANEL - Abnormal; Notable for the following components:      Result Value   Sodium 134 (*)    CO2 19 (*)    Glucose, Bld 102 (*)    Creatinine, Ser 1.31 (*)    GFR, Estimated 39 (*)    All other components within normal limits  CBC - Abnormal; Notable for the following components:   RBC 3.82 (*)    HCT 35.0 (*)    All other components within normal limits  TROPONIN I (HIGH SENSITIVITY) - Abnormal; Notable for the following components:   Troponin I (High Sensitivity) 68 (*)    All other components within normal limits  TROPONIN I (HIGH SENSITIVITY) - Abnormal; Notable for the  following components:   Troponin I (High Sensitivity) 63 (*)    All other components within normal limits  TSH  D-DIMER, QUANTITATIVE    EKG EKG Interpretation  Date/Time:  Monday June 12 2021 21:57:53 EDT Ventricular Rate:  103 PR Interval:  162 QRS Duration: 58 QT Interval:  358 QTC Calculation: 468 R Axis:   41 Text Interpretation: Sinus tachycardia Low voltage QRS Cannot rule out Anterior infarct , age undetermined Abnormal ECG Confirmed by Dene Gentry [phone]) on 06/13/2021 10:07:05 AM  Radiology DG Chest 2 View  Result Date: 06/12/2021 CLINICAL DATA:  Shortness of breath. EXAM: CHEST - 2 VIEW COMPARISON:  None. FINDINGS: The lungs are hyperinflated. Mild, diffuse, chronic appearing increased interstitial lung markings are seen. There is no evidence of a pleural effusion or pneumothorax. A 2.4 cm x 1.8 cm thin walled calcific lesion is seen overlying the lateral aspect of the right lung base. This appears to be within the soft tissues of the right breast on the lateral view. The heart size and mediastinal contours are within normal limits. There is mild to moderate severity levoscoliosis of the lower thoracic spine with multilevel degenerative changes seen within the thoracic spine and bilateral shoulders. IMPRESSION: Chronic changes, as described above, without evidence of acute or active cardiopulmonary disease. Electronically Signed   By: Hoover Browns  Houston M.D.   On: 06/12/2021 22:41    Procedures Procedures   Medications Ordered in ED Medications - No data to display  ED Course  I have reviewed the triage vital signs and the nursing notes.  Pertinent labs & imaging results that were available during my care of the patient were reviewed by me and considered in my medical decision making (see chart for details).    MDM Rules/Calculators/A&P                           MDM  MSE complete  CHANLEY MCENERY was evaluated in Emergency Department on 06/13/2021 for the  symptoms described in the history of present illness. She was evaluated in the context of the global COVID-19 pandemic, which necessitated consideration that the patient might be at risk for infection with the SARS-CoV-2 virus that causes COVID-19. Institutional protocols and algorithms that pertain to the evaluation of patients at risk for COVID-19 are in a state of rapid change based on information released by regulatory bodies including the CDC and federal and state organizations. These policies and algorithms were followed during the patient's care in the ED.  Patient is presenting with complaint of transient dyspnea and palpitations. She denies associated chest pain.  She is asymptomatic at time of my evaluation.   EKG is without acute ischemia. Troponin is 68 -- 63.  Patient would benefit from prolonged cardiac monitoring.   [facility] is aware of case and will consult.  Hospitalist service is aware of case and will evaluate. They are aware of pending D Dimer.        Final Clinical Impression(s) / ED Diagnoses Final diagnoses:  Dyspnea, unspecified type  Palpitations    Rx / DC Orders ED Discharge Orders     None        Valarie Merino, MD 06/13/21 1200

## 2021-06-13 NOTE — ED Notes (Signed)
Patient transported to CT 

## 2021-06-13 NOTE — Consult Note (Signed)
Cardiology Consultation:   Patient ID: Shannon Jensen MRN: [id number]; DOB: [birth year]  Admit date: 06/12/2021 Date of Consult: 06/13/2021  PCP:  [name], MD   [facility] Providers Cardiologist:  None        Patient Profile:   Shannon Jensen is a 87 y.o. female with a hx of breast cancer, HTN, anxiety now admitted with SOB and chest pain who is being seen 06/13/2021 for the evaluation of chest pain at the request of Dr. Broadus John..  History of Present Illness:   Shannon Jensen with minimal PMH see above presented yesterday with chest pain and DOE.  Her PCP described as acute SOB and some DOE.  This was new for pt.  The SOB waxed and waned.  Her HR in PCP office was 150 but by time EKG done HR was 99 and no acute changes. With this issue sent to ER.   No hx of CAD.  Ddimer was >20 - she now has order for STAT CTA of chest to rule out PE.   EKG:  The EKG was personally reviewed and demonstrates:  SR at 99 LAD low voltage QRS  and today ST at 103 T wave inversions in V1 new,low voltage continues Telemetry:  Telemetry was personally reviewed and demonstrates:  SR then ST at 150 vs a flutter 150 - EKG ordered, she has mild SOB with the rapid HR but lying in bed.    Na 136, K+ 4.6 Cr 1.20  Tchol 211, HDL 37, TG 130, LDL 150 WBC 9.5, Hgb 12.3 plts 173 TSH 2.060 Hs troponin 68 and 63  BP 134/58 now 126/62 Resp 19-23 P 96 Only chest pain is Lt lateral pain.    Past Medical History:  Diagnosis Date   Anxiety and depression    Arthritis    Breast cancer ([place]) 08/2008   Right Breast Cancer   Breast cancer ([place]) 2019   Left Breast Cancer   Chronic headaches    Cystocele    Diverticulosis    Family history of breast cancer    Family history of colon cancer    Family history of pancreatic cancer    Family history of prostate cancer    GERD (gastroesophageal reflux disease)    H/O vitamin D deficiency    HLD (hyperlipidemia)    Hypertension    Osteoarthritis     Osteoporosis    Pelvic relaxation    Personal history of radiation therapy 2009   Right Breast Cancer   Urinary tract infection 2014    Past Surgical History:  Procedure Laterality Date   [place]   BREAST LUMPECTOMY Right 2009   BREAST LUMPECTOMY Left 2019   BREAST LUMPECTOMY WITH RADIOACTIVE SEED LOCALIZATION Left 11/19/2017   Procedure: BREAST LUMPECTOMY WITH RADIOACTIVE SEED LOCALIZATION;  Surgeon: Rolm Bookbinder, MD;  Location: [place];  Service: General;  Laterality: Left;   BREAST SURGERY  2009   cancer-lymph node removal   NECK SURGERY     cyst removal     Home Medications:  Prior to Admission medications   Medication Sig Start Date End Date Taking? Authorizing Provider  acetaminophen (TYLENOL) 325 MG tablet Take 650 mg by mouth every 6 (six) hours as needed for moderate pain or headache.     [provider]  amLODipine-olmesartan (AZOR) 5-40 MG tablet Take 1 tablet by mouth daily. 07/19/20   [name], MD  Artificial Tear Ointment (DRY EYES OP)  Place 1-2 drops into both eyes 2 (two) times daily as needed (for dry eyes).    [provider]  Cholecalciferol (VITAMIN D) 2000 units CAPS Take 2,000 Units by mouth daily.    [provider]  diclofenac Sodium (VOLTAREN) 1 % GEL  03/18/20   [provider]  Multiple Vitamins-Minerals (WOMENS ONE DAILY) TABS Take 1 tablet by mouth daily.      [provider]  Omega-3 Fatty Acids (FISH OIL) 1200 MG CAPS Take 1,200 mg by mouth daily.     [provider]  sulfamethoxazole-trimethoprim (BACTRIM DS) 800-160 MG tablet Take 1 tablet by mouth 2 (two) times daily. Patient not taking: Reported on 06/12/2021 07/19/20   [name], MD  tamoxifen (NOLVADEX) 10 MG tablet Take 1 tablet (10 mg total) by mouth daily. 08/30/20   Nicholas Lose, MD    Inpatient Medications: Scheduled Meds:  Continuous Infusions:  PRN Meds:   Allergies:     Allergies  Allergen Reactions   Celecoxib Other (See Comments)    Unknown   Erythromycin Other (See Comments)    Unknown   Iodine Other (See Comments)    Unknown 13 hour prep   Nabumetone Other (See Comments)    Unknown   Nsaids Other (See Comments)    Unknown Unknown    Oxycodone-Acetaminophen Other (See Comments)   Tyloxapol    Vioxx [Rofecoxib] Other (See Comments)    Unknown    Social History:   Social History   Socioeconomic History   Marital status: Widowed    Spouse name: Not on file   Number of children: 4   Years of education: Not on file   Highest education level: Not on file  Occupational History   Occupation: retired    Fish farm manager: RETIRED  Tobacco Use   Smoking status: Never   Smokeless tobacco: Never  Vaping Use   Vaping Use: Never used  Substance and Sexual Activity   Alcohol use: No   Drug use: No   Sexual activity: Never  Other Topics Concern   Not on file  Social History Narrative   Not on file   Social Determinants of Health   Financial Resource Strain: Not on file  Food Insecurity: Not on file  Transportation Needs: Not on file  Physical Activity: Not on file  Stress: Not on file  Social Connections: Not on file  Intimate Partner Violence: Not on file    Family History:    Family History  Problem Relation Age of Onset   Prostate cancer Father 64       d. 67   Heart disease Paternal Uncle    Colon cancer Mother        dx 66s-60s   Breast cancer Sister        dx >50   Pancreatic cancer Brother    Prostate cancer Brother    Colon polyps Daughter        x 2, son   Stroke Daughter        x 2   Appendicitis Daughter        appendectomy   Pancreatic cancer Sister 22       d. 25   Prostate cancer Nephew        dx.50s     ROS:  Please see the history of present illness.  General:no colds or fevers, no weight changes Skin:no rashes or ulcers HEENT:no blurred vision, no congestion CV:see HPI PUL:see HPI GI:no diarrhea  constipation or  melena, no indigestion GU:no hematuria, no dysuria MS:no joint pain, no claudication Neuro:no syncope, no lightheadedness, chronic headaches Endo:no diabetes, no thyroid disease GYN: hx of breast cancer All other ROS reviewed and negative.     Physical Exam/Data:   Vitals:   06/13/21 1019 06/13/21 1022 06/13/21 1030 06/13/21 1100  BP: 123/61  129/60 126/62  Pulse: (!) 107 (!) 104 99 96  Resp:  (!) 30 (!) 24 20  Temp:      TempSrc:      SpO2: 100% 100% 100% 98%  Weight:      Height:       No intake or output data in the 24 hours ending 06/13/21 1334 Last 3 Weights 06/12/2021 06/12/2021 08/30/2020  Weight (lbs) 148 lb 13 oz 149 lb 149 lb  Weight (kg) 67.5 kg 67.586 kg 67.586 kg     Body mass index is 26.36 kg/m.  General:  Well nourished, well developed, in no acute distress HEENT: normal Neck: no JVD Vascular: No carotid bruits; Distal pulses 2+ bilaterally Cardiac:  normal S1, S2; RRR; no murmur gallup rub or click, + lt lateral chest wall pain, cannot recreate with palpation.  Lungs:  clear to auscultation bilaterally, no wheezing, rhonchi or rales  Abd: soft, nontender, no hepatomegaly  Ext: no edema Musculoskeletal:  No deformities, BUE and BLE strength normal and equal Skin: warm and dry  Neuro:  alert and oriented X 3  no focal abnormalities noted Psych:  Normal affect   Relevant CV Studies: Echo pending  Laboratory Data:  High Sensitivity Troponin:   Recent Labs  Lab 06/12/21 2204 06/12/21 2346  TROPONINIHS 68* 63*     Chemistry Recent Labs  Lab 06/12/21 1342 06/12/21 2204  NA 136 134*  K 4.6 4.2  CL 98 102  CO2 19* 19*  GLUCOSE 89 102*  BUN 19 23  CREATININE 1.20* 1.31*  CALCIUM 9.4 9.2  GFRNONAA  --  39*  ANIONGAP  --  13    Recent Labs  Lab 06/12/21 1342  PROT 7.0  ALBUMIN 3.9  AST 25  ALT 16  ALKPHOS 41*  BILITOT 0.6   Lipids  Recent Labs  Lab 06/12/21 1342  CHOL 211*  TRIG 130  HDL 37*  LABVLDL 24  LDLCALC  150*  CHOLHDL 5.7*    Hematology Recent Labs  Lab 06/12/21 1342 06/12/21 2204  WBC 9.5 8.9  RBC 4.01 3.82*  HGB 12.3 12.1  HCT 37.3 35.0*  MCV 93 91.6  MCH 30.7 31.7  MCHC 33.0 34.6  RDW 14.5 14.8  PLT 173 157   Thyroid  Recent Labs  Lab 06/12/21 2204  TSH 1.422    BNPNo results for input(s): BNP, PROBNP in the last 168 hours.  DDimer  Recent Labs  Lab 06/13/21 1051  DDIMER >20.00*     Radiology/Studies:  DG Chest 2 View  Result Date: 06/12/2021 CLINICAL DATA:  Shortness of breath. EXAM: CHEST - 2 VIEW COMPARISON:  None. FINDINGS: The lungs are hyperinflated. Mild, diffuse, chronic appearing increased interstitial lung markings are seen. There is no evidence of a pleural effusion or pneumothorax. A 2.4 cm x 1.8 cm thin walled calcific lesion is seen overlying the lateral aspect of the right lung base. This appears to be within the soft tissues of the right breast on the lateral view. The heart size and mediastinal contours are within normal limits. There is mild to moderate severity levoscoliosis of the lower thoracic spine with multilevel degenerative changes seen  within the thoracic spine and bilateral shoulders. IMPRESSION: Chronic changes, as described above, without evidence of acute or active cardiopulmonary disease. Electronically Signed   By: Virgina Norfolk M.D.   On: 06/12/2021 22:41     Assessment and Plan:   Acute SOB per PCP note and some DOE now with elevated ddimer awaiting stat CTA of chest to rule out PE, CXR without acute process. She denies chest pain, she has lt lateral chest wall pain.   Elevated troponin, mild, will recheck, could be related to PE. Await CTA of chest.  No hx of CAD. On CTA of chest in 2018 mild coronary artery calcifications were noted. Re-order hs troponin.  Tachycardia per Dr. Lanice Shirts not HR was 150 in his office but had slowed by time of EKG.  May need outpt monitor if no PE.  Use tele here to monitor HR, TSH stable.  HERE HR to  150 appears ST awaiting EKG may be flutter with 2:1 flutter will add BB. Will give IV lopressor 2.5 mg to help prevent recurrence.  Pt is not aware of rapid HR.  HLD with LDL of 209 last year and now LDL 150, add statin  Will add IV heparin for now with elevated troponin, elevated ddimer and tachycardia - awaiting CTA of chest .   Pt with no chest pain, some lt lateral discomfort, no chf possible PE. Does have tachycardia ST or flutter  Risk Assessment/Risk Scores:      For questions or updates, please contact [place] Please consult [email or web address] for contact info under    Signed, Cecilie Kicks, NP  06/13/2021 1:34 PM

## 2021-06-13 NOTE — ED Notes (Signed)
Iv team at bedside  

## 2021-06-13 NOTE — H&P (Signed)
History and Physical    Shannon Jensen DQQ:[id number] DOB: Jul 19, 1928 DOA: 06/12/2021  Referring MD/NP/PA: EDP PCP:  Patient coming from: Home  Chief Complaint: Shortness of breath, left-sided chest pain  HPI: Shannon Jensen is a 87 year old female with remote history of breast cancer on tamoxifen, hypertension, anxiety, depression was in her usual state of health until 2 days ago. -She developed dyspnea on exertion on Sunday, subsequently also notice left lateral chest pain along axillary line, that was pleuritic.  Symptoms lingered off and on on Sunday, she went to see her PCP yesterday, she was noted to have an elevated heart rate and a referral was sent to cardiology, she was advised to come to the ED if symptoms recur. -Continue to have intermittent episodes of dyspnea and one episode of palpitation hence came to the ED last night ED Course: Vitals were stable, EKG noted nonspecific changes, labs noted creatinine of 1.3, high-sensitivity troponin of 68, chest x-ray was unremarkable, heart rate and O2 sats were within normal limits, EDP discussed with TRH for overnight observation, D-dimer pending  Review of Systems: As per HPI otherwise 14point review of systems negative.   Past Medical History:  Diagnosis Date   Anxiety and depression    Arthritis    Breast cancer ([place]) 08/2008   Right Breast Cancer   Breast cancer ([place]) 2019   Left Breast Cancer   Chronic headaches    Cystocele    Diverticulosis    Family history of breast cancer    Family history of colon cancer    Family history of pancreatic cancer    Family history of prostate cancer    GERD (gastroesophageal reflux disease)    H/O vitamin D deficiency    HLD (hyperlipidemia)    Hypertension    Osteoarthritis    Osteoporosis    Pelvic relaxation    Personal history of radiation therapy 2009   Right Breast Cancer   Urinary tract infection 2014    Past Surgical History:  Procedure Laterality Date    [place]   BREAST LUMPECTOMY Right 2009   BREAST LUMPECTOMY Left 2019   BREAST LUMPECTOMY WITH RADIOACTIVE SEED LOCALIZATION Left 11/19/2017   Procedure: BREAST LUMPECTOMY WITH RADIOACTIVE SEED LOCALIZATION;  Surgeon: Rolm Bookbinder, MD;  Location: [facility];  Service: General;  Laterality: Left;   BREAST SURGERY  2009   cancer-lymph node removal   NECK SURGERY     cyst removal     reports that she has never smoked. She has never used smokeless tobacco. She reports that she does not drink alcohol and does not use drugs.  Allergies  Allergen Reactions   Celecoxib Other (See Comments)    Unknown   Erythromycin Other (See Comments)    Unknown   Iodine Other (See Comments)    Unknown 13 hour prep   Nabumetone Other (See Comments)    Unknown   Nsaids Other (See Comments)    Unknown Unknown    Oxycodone-Acetaminophen Other (See Comments)   Tyloxapol    Vioxx [Rofecoxib] Other (See Comments)    Unknown    Family History  Problem Relation Age of Onset   Prostate cancer Father 81       d. 35   Heart disease Paternal Uncle    Colon cancer Mother        dx 35s-60s   Breast cancer Sister        dx >50   Pancreatic  cancer Brother    Prostate cancer Brother    Colon polyps Daughter        x 2, son   Stroke Daughter        x 2   Appendicitis Daughter        appendectomy   Pancreatic cancer Sister 75       d. 53   Prostate cancer Nephew        dx.50s     Prior to Admission medications   Medication Sig Start Date End Date Taking? Authorizing Provider  acetaminophen (TYLENOL) 325 MG tablet Take 650 mg by mouth every 6 (six) hours as needed for moderate pain or headache.     [provider]  amLODipine-olmesartan (AZOR) 5-40 MG tablet Take 1 tablet by mouth daily. 07/19/20   [name], MD  Artificial Tear Ointment (DRY EYES OP) Place 1-2 drops into both eyes 2 (two) times daily as needed (for dry eyes).    [provider]  Cholecalciferol (VITAMIN D) 2000 units CAPS Take 2,000 Units by mouth daily.    [provider]  diclofenac Sodium (VOLTAREN) 1 % GEL  03/18/20   [provider]  Multiple Vitamins-Minerals (WOMENS ONE DAILY) TABS Take 1 tablet by mouth daily.      [provider]  Omega-3 Fatty Acids (FISH OIL) 1200 MG CAPS Take 1,200 mg by mouth daily.     [provider]  sulfamethoxazole-trimethoprim (BACTRIM DS) 800-160 MG tablet Take 1 tablet by mouth 2 (two) times daily. Patient not taking: Reported on 06/12/2021 07/19/20   [name], MD  tamoxifen (NOLVADEX) 10 MG tablet Take 1 tablet (10 mg total) by mouth daily. 08/30/20   Nicholas Lose, MD    Physical Exam: Vitals:   06/13/21 1019 06/13/21 1022 06/13/21 1030 06/13/21 1100  BP: 123/61  129/60 126/62  Pulse: (!) 107 (!) 104 99 96  Resp:  (!) 30 (!) 24 20  Temp:      TempSrc:      SpO2: 100% 100% 100% 98%  Weight:      Height:        General: AAOx3, no distress HEENT: No JVD CVS: S1-S2, regular rate rhythm Lungs: Clear bilaterally, decreased at the bases Abdomen: Soft, nontender, bowel sounds present Extremities: No edema Skin: No rash on exposed skin Psych: Appropriate mood and affect  CBC: Recent Labs  Lab 06/12/21 1342 06/12/21 2204  WBC 9.5 8.9  NEUTROABS 5.5  --   HGB 12.3 12.1  HCT 37.3 35.0*  MCV 93 91.6  PLT 173 387   Basic Metabolic Panel: Recent Labs  Lab 06/12/21 1342 06/12/21 2204  NA 136 134*  K 4.6 4.2  CL 98 102  CO2 19* 19*  GLUCOSE 89 102*  BUN 19 23  CREATININE 1.20* 1.31*  CALCIUM 9.4 9.2   GFR: Estimated Creatinine Clearance: 26.3 mL/min (A) (by C-G formula based on SCr of 1.31 mg/dL (H)). Liver Function Tests: Recent Labs  Lab 06/12/21 1342  AST 25  ALT 16  ALKPHOS 41*  BILITOT 0.6  PROT 7.0  ALBUMIN 3.9   No results for input(s): LIPASE, AMYLASE in the last 168 hours. No results for input(s): AMMONIA in the last 168  hours. Coagulation Profile: No results for input(s): INR, PROTIME in the last 168 hours. Cardiac Enzymes: No results for input(s): CKTOTAL, CKMB, CKMBINDEX, TROPONINI in the last 168 hours. BNP (last 3 results) No results for input(s): PROBNP in the last 8760  hours. HbA1C: No results for input(s): HGBA1C in the last 72 hours. CBG: No results for input(s): GLUCAP in the last 168 hours. Lipid Profile: Recent Labs    06/12/21 1342  CHOL 211*  HDL 37*  LDLCALC 150*  TRIG 130  CHOLHDL 5.7*   Thyroid Function Tests: Recent Labs    06/12/21 2204  TSH 1.422   Anemia Panel: No results for input(s): VITAMINB12, FOLATE, FERRITIN, TIBC, IRON, RETICCTPCT in the last 72 hours. Urine analysis:    Component Value Date/Time   COLORURINE yellow 05/05/2009 0905   APPEARANCEUR Cloudy 05/05/2009 0905   LABSPEC 1.015 07/19/2020 1514   PHURINE 7.0 10/29/2016 1457   PHURINE 7.5 05/05/2009 0905   GLUCOSEU Negative 10/29/2016 1457   HGBUR Small 10/29/2016 1457   HGBUR negative 05/05/2009 0905   BILIRUBINUR negative 07/19/2020 1514   BILIRUBINUR neg 05/29/2017 1701   BILIRUBINUR Negative 10/29/2016 1457   KETONESUR trace (5) (A) 07/19/2020 1514   KETONESUR 15 10/29/2016 1457   PROTEINUR =30 (A) 07/19/2020 1514   PROTEINUR trace 05/29/2017 1701   PROTEINUR 30 10/29/2016 1457   UROBILINOGEN negative (A) 05/29/2017 1701   UROBILINOGEN 0.2 10/29/2016 1457   NITRITE Positive (A) 07/19/2020 1514   NITRITE neg 05/29/2017 1701   NITRITE Negative 10/29/2016 1457   NITRITE negative 05/05/2009 0905   LEUKOCYTESUR Large (3+) (A) 07/19/2020 1514   LEUKOCYTESUR Large 10/29/2016 1457   Sepsis Labs: @LABRCNTIP (procalcitonin:4,lacticidven:4) )No results found for this or any previous visit (from the past 240 hour(s)).   Radiological Exams on Admission: DG Chest 2 View  Result Date: 06/12/2021 CLINICAL DATA:  Shortness of breath. EXAM: CHEST - 2 VIEW COMPARISON:  None. FINDINGS: The lungs are  hyperinflated. Mild, diffuse, chronic appearing increased interstitial lung markings are seen. There is no evidence of a pleural effusion or pneumothorax. A 2.4 cm x 1.8 cm thin walled calcific lesion is seen overlying the lateral aspect of the right lung base. This appears to be within the soft tissues of the right breast on the lateral view. The heart size and mediastinal contours are within normal limits. There is mild to moderate severity levoscoliosis of the lower thoracic spine with multilevel degenerative changes seen within the thoracic spine and bilateral shoulders. IMPRESSION: Chronic changes, as described above, without evidence of acute or active cardiopulmonary disease. Electronically Signed   By: Virgina Norfolk M.D.   On: 06/12/2021 22:41    EKG: Independently reviewed.  Normal sinus rhythm, nonspecific T wave changes  Assessment/Plan  Atypical chest pain with pleuritic component Sinus tachycardia Dyspnea on exertion -Check CTA chest to rule out PE -Minimally elevated high-sensitivity troponin will repeat -Monitor on telemetry -Check 2D echocardiogram  Remote history of breast cancer -Continue tamoxifen  History of essential hypertension -Continue amlodipine  DVT prophylaxis: lovenox Code Status: Full code per patient's wishes Family Communication: Son at bedside Disposition Plan: Home pending above work-up Consults called: None Admission status: Observation  Domenic Polite MD [facility]   06/13/2021, 12:35 PM

## 2021-06-13 NOTE — Progress Notes (Signed)
ANTICOAGULATION CONSULT NOTE - Initial Consult  Pharmacy Consult for heparin Indication: chest pain/ACS/possible PE/possible A flutter  Allergies  Allergen Reactions   Celecoxib Other (See Comments)    Unknown   Erythromycin Other (See Comments)    Unknown   Nabumetone Other (See Comments)    Unknown   Nsaids Other (See Comments)    Unknown Unknown    Oxycodone-Acetaminophen Other (See Comments)   Tyloxapol    Vioxx [Rofecoxib] Other (See Comments)    Unknown    Patient Measurements: Height: 5\' 3"  (160 cm) Weight: 67.5 kg (148 lb 13 oz) IBW/kg (Calculated) : 52.4 Heparin Dosing Weight: 66.1 kg  Vital Signs: BP: 126/62 (10/04 1100) Pulse Rate: 95 (10/04 1431)  Labs: Recent Labs    06/12/21 1342 06/12/21 2204 06/12/21 2346  HGB 12.3 12.1  --   HCT 37.3 35.0*  --   PLT 173 157  --   CREATININE 1.20* 1.31*  --   TROPONINIHS  --  68* 63*    Estimated Creatinine Clearance: 26.3 mL/min (A) (by C-G formula based on SCr of 1.31 mg/dL (H)).   Medical History: Past Medical History:  Diagnosis Date   Anxiety and depression    Arthritis    Breast cancer ([place]) 08/2008   Right Breast Cancer   Breast cancer ([place]) 2019   Left Breast Cancer   Chronic headaches    Cystocele    Diverticulosis    Family history of breast cancer    Family history of colon cancer    Family history of pancreatic cancer    Family history of prostate cancer    GERD (gastroesophageal reflux disease)    H/O vitamin D deficiency    HLD (hyperlipidemia)    Hypertension    Osteoarthritis    Osteoporosis    Pelvic relaxation    Personal history of radiation therapy 2009   Right Breast Cancer   Urinary tract infection 2014    Medications: see MAR  Assessment: 87 yo F with chest pain and DOE. No Hx of CAD. D-dimer elevated > 20, getting CTA to rule out PE. Also with tachycardia, may be some flutter. Also note to have elevated troponin of 68 > 63.   Baseline CBC wnl, no s/sx of bleeding.  No AC PTA.  Goal of Therapy:  Heparin level 0.3-0.7 units/ml Monitor platelets by anticoagulation protocol: Yes   Plan:  Give 3000 units bolus x 1 Start heparin infusion at 900 units/hr Check anti-Xa level in 8 hours and daily while on heparin Continue to monitor H&H and platelets  Joetta Manners, PharmD, [facility] Emergency Medicine Clinical Pharmacist ED RPh Phone: [place]: [phone]

## 2021-06-13 NOTE — ED Notes (Signed)
Pt noted to have 4th new visitor (grandson) in room. This RN went to educate pt & grandson on hospital/ED policy regarding visitation vs inpt visitation. Grandson voiced dissatisfaction w visitor policy, advised that when son (original visitor) returned to room, he would be the only visitor allowed until room available upstairs. Pt & grandson voiced understanding, visitor mgmt called by secretary & informed of same.

## 2021-06-14 ENCOUNTER — Other Ambulatory Visit (HOSPITAL_COMMUNITY): Payer: Self-pay

## 2021-06-14 ENCOUNTER — Observation Stay (HOSPITAL_BASED_OUTPATIENT_CLINIC_OR_DEPARTMENT_OTHER): Payer: Medicare PPO

## 2021-06-14 DIAGNOSIS — R0600 Dyspnea, unspecified: Secondary | ICD-10-CM | POA: Diagnosis not present

## 2021-06-14 DIAGNOSIS — I10 Essential (primary) hypertension: Secondary | ICD-10-CM | POA: Diagnosis not present

## 2021-06-14 DIAGNOSIS — I2692 Saddle embolus of pulmonary artery without acute cor pulmonale: Secondary | ICD-10-CM | POA: Diagnosis not present

## 2021-06-14 DIAGNOSIS — R0609 Other forms of dyspnea: Secondary | ICD-10-CM

## 2021-06-14 DIAGNOSIS — R002 Palpitations: Secondary | ICD-10-CM

## 2021-06-14 DIAGNOSIS — R079 Chest pain, unspecified: Secondary | ICD-10-CM

## 2021-06-14 DIAGNOSIS — I471 Supraventricular tachycardia: Secondary | ICD-10-CM | POA: Diagnosis not present

## 2021-06-14 LAB — COMPREHENSIVE METABOLIC PANEL
ALT: 16 U/L (ref 0–44)
AST: 22 U/L (ref 15–41)
Albumin: 2.9 g/dL — ABNORMAL LOW (ref 3.5–5.0)
Alkaline Phosphatase: 33 U/L — ABNORMAL LOW (ref 38–126)
Anion gap: 8 (ref 5–15)
BUN: 23 mg/dL (ref 8–23)
CO2: 20 mmol/L — ABNORMAL LOW (ref 22–32)
Calcium: 8.8 mg/dL — ABNORMAL LOW (ref 8.9–10.3)
Chloride: 104 mmol/L (ref 98–111)
Creatinine, Ser: 1.25 mg/dL — ABNORMAL HIGH (ref 0.44–1.00)
GFR, Estimated: 41 mL/min — ABNORMAL LOW (ref >60)
Glucose, Bld: 97 mg/dL (ref 70–99)
Potassium: 4.5 mmol/L (ref 3.5–5.1)
Sodium: 132 mmol/L — ABNORMAL LOW (ref 135–145)
Total Bilirubin: 0.7 mg/dL (ref 0.3–1.2)
Total Protein: 5.9 g/dL — ABNORMAL LOW (ref 6.5–8.1)

## 2021-06-14 LAB — ECHOCARDIOGRAM COMPLETE
Area-P 1/2: 5.02 cm2
Height: 63 in
S' Lateral: 1.7 cm
Single Plane A4C EF: 67.9 %
Weight: 2328 oz

## 2021-06-14 LAB — CBC
HCT: 32.7 % — ABNORMAL LOW (ref 36.0–46.0)
Hemoglobin: 11.2 g/dL — ABNORMAL LOW (ref 12.0–15.0)
MCH: 30.9 pg (ref 26.0–34.0)
MCHC: 34.3 g/dL (ref 30.0–36.0)
MCV: 90.3 fL (ref 80.0–100.0)
Platelets: 150 10*3/uL (ref 150–400)
RBC: 3.62 MIL/uL — ABNORMAL LOW (ref 3.87–5.11)
RDW: 14.5 % (ref 11.5–15.5)
WBC: 7.6 10*3/uL (ref 4.0–10.5)
nRBC: 0 % (ref 0.0–0.2)

## 2021-06-14 LAB — RESP PANEL BY RT-PCR (FLU A&B, COVID) ARPGX2
Influenza A by PCR: NEGATIVE
Influenza B by PCR: NEGATIVE
SARS Coronavirus 2 by RT PCR: NEGATIVE

## 2021-06-14 LAB — HEPARIN LEVEL (UNFRACTIONATED)
Heparin Unfractionated: 0.22 IU/mL — ABNORMAL LOW (ref 0.30–0.70)
Heparin Unfractionated: 0.86 IU/mL — ABNORMAL HIGH (ref 0.30–0.70)
Heparin Unfractionated: 0.71 IU/mL — ABNORMAL HIGH (ref 0.30–0.70)

## 2021-06-14 MED ORDER — CYCLOBENZAPRINE HCL 5 MG PO TABS
5.0000 mg | ORAL_TABLET | Freq: Three times a day (TID) | ORAL | Status: DC | PRN
  Filled 2021-06-14: qty 1

## 2021-06-14 MED ORDER — HEPARIN BOLUS VIA INFUSION
1000.0000 [IU] | Freq: Once | INTRAVENOUS | Status: AC
  Administered 2021-06-14: 1000 [IU] via INTRAVENOUS
  Filled 2021-06-14: qty 1000

## 2021-06-14 MED ORDER — ALUM & MAG HYDROXIDE-SIMETH 200-200-20 MG/5ML PO SUSP
15.0000 mL | Freq: Four times a day (QID) | ORAL | Status: DC | PRN
  Administered 2021-06-14 (×2): 15 mL via ORAL
  Filled 2021-06-14 (×3): qty 30

## 2021-06-14 NOTE — Progress Notes (Signed)
Patient arrived to unit, complains of left side/lower back pain. Gave PRN medication. Son [facility]) is at bedside. VSS. Heparin drip infusing at 76ml/hr. Will continue to monitor.

## 2021-06-14 NOTE — Progress Notes (Signed)
ANTICOAGULATION CONSULT NOTE -follow up  Pharmacy Consult for heparin Indication: chest pain/ACS/possible PE/possible A flutter  Allergies  Allergen Reactions   Celecoxib Other (See Comments)    Unknown   Erythromycin Other (See Comments)    Unknown   Nabumetone Other (See Comments)    Unknown   Nsaids Other (See Comments)    Unknown Unknown    Oxycodone-Acetaminophen Other (See Comments)   Tyloxapol    Vioxx [Rofecoxib] Other (See Comments)    Unknown    Patient Measurements: Height: 5\' 3"  (160 cm) Weight: 66 kg (145 lb 8 oz) IBW/kg (Calculated) : 52.4 Heparin Dosing Weight: 66.1 kg  Vital Signs: Temp: 97.2 F (36.2 C) (10/05 1205) Temp Source: Oral (10/05 1205) BP: 133/73 (10/05 1630) Pulse Rate: 79 (10/05 1630)  Labs: Recent Labs    06/12/21 1342 06/12/21 2204 06/12/21 2204 06/12/21 2346 06/13/21 1441 06/13/21 1633 06/13/21 2300 06/14/21 0337 06/14/21 0750 06/14/21 1807  HGB 12.3 12.1  --   --   --   --   --  11.2*  --   --   HCT 37.3 35.0*  --   --   --   --   --  32.7*  --   --   PLT 173 157  --   --   --   --   --  150  --   --   APTT  --   --   --   --   --  88*  --   --   --   --   LABPROT  --   --   --   --   --  16.7*  --   --   --   --   INR  --   --   --   --   --  1.4*  --   --   --   --   HEPARINUNFRC  --   --   --   --   --   --  0.22*  --  0.86* 0.71*  CREATININE 1.20* 1.31*  --   --   --   --   --  1.25*  --   --   TROPONINIHS  --  68*   < > 63* 43* 49*  --   --   --   --    < > = values in this interval not displayed.     Estimated Creatinine Clearance: 27.3 mL/min (A) (by C-G formula based on SCr of 1.25 mg/dL (H)).   Medical History: Past Medical History:  Diagnosis Date   Anxiety and depression    Arthritis    Breast cancer ([place]) 08/2008   Right Breast Cancer   Breast cancer ([place]) 2019   Left Breast Cancer   Chronic headaches    Cystocele    Diverticulosis    Family history of breast cancer    Family history of colon  cancer    Family history of pancreatic cancer    Family history of prostate cancer    GERD (gastroesophageal reflux disease)    H/O vitamin D deficiency    HLD (hyperlipidemia)    Hypertension    Osteoarthritis    Osteoporosis    Pelvic relaxation    Personal history of radiation therapy 2009   Right Breast Cancer   Urinary tract infection 2014    Medications: see MAR  Assessment:  87 yo F with chest pain and DOE. No Hx of  CAD. D-dimer elevated > 20. Pharmacy consulted to start heparin for PE.   Heparin level remains slightly above goal at 0.71.   Goal of Therapy:  Heparin level 0.3-0.7 units/ml Monitor platelets by anticoagulation protocol: Yes   Plan:  Reduce heparin slightly to 950 units/h Recheck heparin level with am labs  [name], PharmD, BCPS, [facility] Clinical Pharmacist [phone] Please check AMION for all [place] numbers 06/14/2021

## 2021-06-14 NOTE — Progress Notes (Signed)
PROGRESS NOTE    APPLE DEARMAS  RCV:[id number] DOB: 1928/05/06 DOA: 06/12/2021 PCP: [name], MD  Brief Narrative:Shannon Jensen is a 87 year old female with remote history of breast cancer on tamoxifen, hypertension, anxiety, depression presented to the ED with left lateral chest wall pain, dyspnea on exertion and tachycardia. -In the ED she was noted to have elevated D-dimer, mildly elevated high-sensitivity troponin -CTA chest noted submassive PE   Assessment & Plan:   Submassive bilateral PE Concern for right heart strain on CT -No clear trigger, history of breast cancer is remote -Continue IV heparin today, monitor on telemetry -Follow-up 2D echocardiogram to assess RV -Transition to oral anticoagulation tomorrow  SVT -Secondary to above, started on metoprolol by cards yesterday  Remote history of breast cancer -Continue tamoxifen  Thyroid nodules -Will need endocrinology referral at discharge  DVT prophylaxis: IV heparin Code Status: Full code Family Communication: Discussed with patient and son at bedside Disposition Plan:  Status is: Observation  The patient will require care spanning > 2 midnights and should be moved to inpatient because: Inpatient level of care appropriate due to severity of illness  Dispo: The patient is from: Home              Anticipated d/c is to: Home              Patient currently is not medically stable to d/c.   Difficult to place patient No   Consultants:  Cardiology  Procedures:   Antimicrobials:    Subjective: -Starting to feel better, still has some pleuritic left lateral chest pain, has dyspnea on exertion  Objective: Vitals:   06/14/21 0140 06/14/21 0224 06/14/21 0601 06/14/21 0906  BP:  140/79 116/62 136/68  Pulse:  99 85 92  Resp:  18 20 18   Temp: 98.2 F (36.8 C) 98.9 F (37.2 C) 99.3 F (37.4 C)   TempSrc: Temporal Oral Oral   SpO2:  98% 99% 100%  Weight:  66 kg    Height:  5\' 3"  (1.6 m)       Intake/Output Summary (Last 24 hours) at 06/14/2021 1137 Last data filed at 06/14/2021 0445 Gross per 24 hour  Intake 396.94 ml  Output 300 ml  Net 96.94 ml   Filed Weights   06/12/21 2152 06/14/21 0224  Weight: 67.5 kg 66 kg    Examination:  General exam: Pleasant elderly female sitting up in bed, AAOx3, no distress HEENT: No JVD CVS: S1-S2, regular rhythm Lungs: Decreased breath sounds to bases Abdomen: Soft, nontender, bowel sounds present Extremities: No edema Skin: No rash on exposed skin  Psychiatry: Judgement and insight appear normal. Mood & affect appropriate.     Data Reviewed:   CBC: Recent Labs  Lab 06/12/21 1342 06/12/21 2204 06/14/21 0337  WBC 9.5 8.9 7.6  NEUTROABS 5.5  --   --   HGB 12.3 12.1 11.2*  HCT 37.3 35.0* 32.7*  MCV 93 91.6 90.3  PLT 173 157 102   Basic Metabolic Panel: Recent Labs  Lab 06/12/21 1342 06/12/21 2204 06/14/21 0337  NA 136 134* 132*  K 4.6 4.2 4.5  CL 98 102 104  CO2 19* 19* 20*  GLUCOSE 89 102* 97  BUN 19 23 23   CREATININE 1.20* 1.31* 1.25*  CALCIUM 9.4 9.2 8.8*   GFR: Estimated Creatinine Clearance: 27.3 mL/min (A) (by C-G formula based on SCr of 1.25 mg/dL (H)). Liver Function Tests: Recent Labs  Lab 06/12/21 1342 06/14/21 0337  AST 25  22  ALT 16 16  ALKPHOS 41* 33*  BILITOT 0.6 0.7  PROT 7.0 5.9*  ALBUMIN 3.9 2.9*   No results for input(s): LIPASE, AMYLASE in the last 168 hours. No results for input(s): AMMONIA in the last 168 hours. Coagulation Profile: Recent Labs  Lab 06/13/21 1633  INR 1.4*   Cardiac Enzymes: No results for input(s): CKTOTAL, CKMB, CKMBINDEX, TROPONINI in the last 168 hours. BNP (last 3 results) No results for input(s): PROBNP in the last 8760 hours. HbA1C: No results for input(s): HGBA1C in the last 72 hours. CBG: No results for input(s): GLUCAP in the last 168 hours. Lipid Profile: Recent Labs    06/12/21 1342  CHOL 211*  HDL 37*  LDLCALC 150*  TRIG 130   CHOLHDL 5.7*   Thyroid Function Tests: Recent Labs    06/12/21 2204  TSH 1.422   Anemia Panel: No results for input(s): VITAMINB12, FOLATE, FERRITIN, TIBC, IRON, RETICCTPCT in the last 72 hours. Urine analysis:    Component Value Date/Time   COLORURINE yellow 05/05/2009 0905   APPEARANCEUR Cloudy 05/05/2009 0905   LABSPEC 1.015 07/19/2020 1514   PHURINE 7.0 10/29/2016 1457   PHURINE 7.5 05/05/2009 0905   GLUCOSEU Negative 10/29/2016 1457   HGBUR Small 10/29/2016 1457   HGBUR negative 05/05/2009 0905   BILIRUBINUR negative 07/19/2020 1514   BILIRUBINUR neg 05/29/2017 1701   BILIRUBINUR Negative 10/29/2016 1457   KETONESUR trace (5) (A) 07/19/2020 1514   KETONESUR 15 10/29/2016 1457   PROTEINUR =30 (A) 07/19/2020 1514   PROTEINUR trace 05/29/2017 1701   PROTEINUR 30 10/29/2016 1457   UROBILINOGEN negative (A) 05/29/2017 1701   UROBILINOGEN 0.2 10/29/2016 1457   NITRITE Positive (A) 07/19/2020 1514   NITRITE neg 05/29/2017 1701   NITRITE Negative 10/29/2016 1457   NITRITE negative 05/05/2009 0905   LEUKOCYTESUR Large (3+) (A) 07/19/2020 1514   LEUKOCYTESUR Large 10/29/2016 1457   Sepsis Labs: @LABRCNTIP (procalcitonin:4,lacticidven:4)  ) Recent Results (from the past 240 hour(s))  Resp Panel by RT-PCR (Flu A&B, Covid) Nasopharyngeal Swab     Status: None   Collection Time: 06/14/21  1:29 AM   Specimen: Nasopharyngeal Swab; Nasopharyngeal(NP) swabs in vial transport medium  Result Value Ref Range Status   SARS Coronavirus 2 by RT PCR NEGATIVE NEGATIVE Final    Comment: (NOTE) SARS-CoV-2 target nucleic acids are NOT DETECTED.  The SARS-CoV-2 RNA is generally detectable in upper respiratory specimens during the acute phase of infection. The lowest concentration of SARS-CoV-2 viral copies this assay can detect is 138 copies/mL. A negative result does not preclude SARS-Cov-2 infection and should not be used as the sole basis for treatment or other patient management  decisions. A negative result may occur with  improper specimen collection/handling, submission of specimen other than nasopharyngeal swab, presence of viral mutation(s) within the areas targeted by this assay, and inadequate number of viral copies(<138 copies/mL). A negative result must be combined with clinical observations, patient history, and epidemiological information. The expected result is Negative.  Fact Sheet for Patients:  EntrepreneurPulse.com.au  Fact Sheet for Healthcare Providers:  IncredibleEmployment.be  This test is no t yet approved or cleared by the [place] FDA and  has been authorized for detection and/or diagnosis of SARS-CoV-2 by FDA under an Emergency Use Authorization (EUA). This EUA will remain  in effect (meaning this test can be used) for the duration of the COVID-19 declaration under Section 564(b)(1) of the Act, 21 U.S.C.section 360bbb-3(b)(1), unless the authorization is terminated  or revoked  sooner.       Influenza A by PCR NEGATIVE NEGATIVE Final   Influenza B by PCR NEGATIVE NEGATIVE Final    Comment: (NOTE) The Xpert Xpress SARS-CoV-2/FLU/RSV plus assay is intended as an aid in the diagnosis of influenza from Nasopharyngeal swab specimens and should not be used as a sole basis for treatment. Nasal washings and aspirates are unacceptable for Xpert Xpress SARS-CoV-2/FLU/RSV testing.  Fact Sheet for Patients: EntrepreneurPulse.com.au  Fact Sheet for Healthcare Providers: IncredibleEmployment.be  This test is not yet approved or cleared by the [place] FDA and has been authorized for detection and/or diagnosis of SARS-CoV-2 by FDA under an Emergency Use Authorization (EUA). This EUA will remain in effect (meaning this test can be used) for the duration of the COVID-19 declaration under Section 564(b)(1) of the Act, 21 U.S.C. section 360bbb-3(b)(1), unless the  authorization is terminated or revoked.  Performed at [facility], [place] [address], [place], [place] [address]          Radiology Studies: DG Chest 2 View  Result Date: 06/12/2021 CLINICAL DATA:  Shortness of breath. EXAM: CHEST - 2 VIEW COMPARISON:  None. FINDINGS: The lungs are hyperinflated. Mild, diffuse, chronic appearing increased interstitial lung markings are seen. There is no evidence of a pleural effusion or pneumothorax. A 2.4 cm x 1.8 cm thin walled calcific lesion is seen overlying the lateral aspect of the right lung base. This appears to be within the soft tissues of the right breast on the lateral view. The heart size and mediastinal contours are within normal limits. There is mild to moderate severity levoscoliosis of the lower thoracic spine with multilevel degenerative changes seen within the thoracic spine and bilateral shoulders. IMPRESSION: Chronic changes, as described above, without evidence of acute or active cardiopulmonary disease. Electronically Signed   By: Virgina Norfolk M.D.   On: 06/12/2021 22:41   CT Angio Chest Pulmonary Embolism (PE) W or WO Contrast  Result Date: 06/13/2021 CLINICAL DATA:  Short of breath. Clinical suspicion for pulmonary emboli. EXAM: CT ANGIOGRAPHY CHEST WITH CONTRAST TECHNIQUE: Multidetector CT imaging of the chest was performed using the standard protocol during bolus administration of intravenous contrast. Multiplanar CT image reconstructions and MIPs were obtained to evaluate the vascular anatomy. CONTRAST:  77mL OMNIPAQUE IOHEXOL 350 MG/ML SOLN COMPARISON:  CT, 12/06/2016.  Chest radiograph from 06/12/2021. FINDINGS: Cardiovascular: Pulmonary arteries are well opacified. There are bilateral pulmonary emboli. A saddle embolus straddles the main pulmonary artery extending into the right and left pulmonary arteries. Nonocclusive pulmonary embolus noted in the apical segmental branch of the right upper lobe. Another small pulmonary  embolus lies in the right middle lobe, medial segment. Pulmonary emboli extend into the origins of the left upper lobe anterior and apical segmental branches, as well as the lingular segment branches. Nonocclusive pulmonary emboli at the origin of the lower lobe segmental branches. Heart is normal in size and configuration. RV LV ratio is 1.28. No pericardial effusion. No coronary artery calcifications. Aorta is normal in caliber. Aortic atherosclerosis. Mediastinum/Nodes: Thyroid mildly enlarged and heterogeneous suggesting small nodules. No mediastinal or hilar masses or enlarged lymph nodes. Trachea and esophagus are unremarkable. Lungs/Pleura: Predominantly linear type opacities noted in the dependent lower lobes consistent with atelectasis. Lungs otherwise clear. No pleural effusion. No pneumothorax. Upper Abdomen: No acute abnormality. Musculoskeletal: No acute fracture. No bone lesion. Arthropathic changes of both glenohumeral joints, advanced and more severe on the right. Chronic seroma in the right breast, stable. Review of  the MIP images confirms the above findings. IMPRESSION: 1. Bilateral pulmonary emboli as detailed. Positive for acute PE with CT evidence of right heart strain (RV/LV Ratio = 1.28) consistent with at least submassive (intermediate risk) PE. The presence of right heart strain has been associated with an increased risk of morbidity and mortality. Please refer to the "PE Focused" order set in EPIC. 2. No other acute abnormality within the chest. 3. Aortic atherosclerosis. 4. Thyroid prominent with evidence of small nodules. Patient has had previous thyroid ultrasound and FNA biopsy, last ultrasound dated 07/09/2019. Aortic Atherosclerosis (ICD10-I70.0). Electronically Signed   By: Lajean Manes M.D.   On: 06/13/2021 15:35        Scheduled Meds:  cholecalciferol  2,000 Units Oral Daily   metoprolol tartrate  25 mg Oral BID   multivitamin with minerals  1 tablet Oral Daily    tamoxifen  10 mg Oral Daily   Continuous Infusions:  heparin 1,000 Units/hr (06/14/21 0952)     LOS: 0 days    Time spent: 56min    Domenic Polite, MD [facility]   06/14/2021, 11:37 AM

## 2021-06-14 NOTE — Progress Notes (Signed)
  Echocardiogram 2D Echocardiogram has been performed.  Shannon Jensen 06/14/2021, 11:15 AM

## 2021-06-14 NOTE — Progress Notes (Signed)
Mobility Specialist Progress Note    06/14/21 1609  Mobility  Activity Refused mobility   Pt says legs have been cramping today but agreed to work with me in the morning.   Hildred Alamin Mobility Specialist  Mobility Specialist Phone: [phone]

## 2021-06-14 NOTE — Progress Notes (Signed)
ANTICOAGULATION CONSULT NOTE -follow up  Pharmacy Consult for heparin Indication: chest pain/ACS/possible PE/possible A flutter  Allergies  Allergen Reactions   Celecoxib Other (See Comments)    Unknown   Erythromycin Other (See Comments)    Unknown   Nabumetone Other (See Comments)    Unknown   Nsaids Other (See Comments)    Unknown Unknown    Oxycodone-Acetaminophen Other (See Comments)   Tyloxapol    Vioxx [Rofecoxib] Other (See Comments)    Unknown    Patient Measurements: Height: 5\' 3"  (160 cm) Weight: 66 kg (145 lb 8 oz) IBW/kg (Calculated) : 52.4 Heparin Dosing Weight: 66.1 kg  Vital Signs: Temp: 99.3 F (37.4 C) (10/05 0601) Temp Source: Oral (10/05 0601) BP: 116/62 (10/05 0601) Pulse Rate: 85 (10/05 0601)  Labs: Recent Labs    06/12/21 1342 06/12/21 2204 06/12/21 2204 06/12/21 2346 06/13/21 1441 06/13/21 1633 06/13/21 2300 06/14/21 0337 06/14/21 0750  HGB 12.3 12.1  --   --   --   --   --  11.2*  --   HCT 37.3 35.0*  --   --   --   --   --  32.7*  --   PLT 173 157  --   --   --   --   --  150  --   APTT  --   --   --   --   --  88*  --   --   --   LABPROT  --   --   --   --   --  16.7*  --   --   --   INR  --   --   --   --   --  1.4*  --   --   --   HEPARINUNFRC  --   --   --   --   --   --  0.22*  --  0.86*  CREATININE 1.20* 1.31*  --   --   --   --   --  1.25*  --   TROPONINIHS  --  68*   < > 63* 43* 49*  --   --   --    < > = values in this interval not displayed.     Estimated Creatinine Clearance: 27.3 mL/min (A) (by C-G formula based on SCr of 1.25 mg/dL (H)).   Medical History: Past Medical History:  Diagnosis Date   Anxiety and depression    Arthritis    Breast cancer ([place]) 08/2008   Right Breast Cancer   Breast cancer ([place]) 2019   Left Breast Cancer   Chronic headaches    Cystocele    Diverticulosis    Family history of breast cancer    Family history of colon cancer    Family history of pancreatic cancer    Family  history of prostate cancer    GERD (gastroesophageal reflux disease)    H/O vitamin D deficiency    HLD (hyperlipidemia)    Hypertension    Osteoarthritis    Osteoporosis    Pelvic relaxation    Personal history of radiation therapy 2009   Right Breast Cancer   Urinary tract infection 2014    Medications: see MAR  Assessment: 87 yo F with chest pain and DOE. No Hx of CAD. D-dimer elevated > 20. Pharmacy consulted to start heparin for  rule out PE. Also with tachycardia, may be some flutter. Also note to have elevated troponin  of 68 > 63. Baseline CBC wnl,  No AC PTA. Later CT angio reveals positive for acute bilateral PE w/ RHS.  Thus, IV heparin infusion continues.   Heparin rate was increased and 1000 units bolus given early this AM in response to intial sub-therapeutic heparin level.   Now the 6h HL = 0.86 on 1100 units/hr heparin drip.  HL drawn ~1.5 hr early. Heparin level could be slightly above goal due to timing of lab draw 6hr instead of 8 hr, bolus given although small but appropriate bolus and patient's age.   No issues with heparin infusion and no bleeding  reported per RN.  Will reduce heparin rate and recheck heparin level in 8 hours.    Goal of Therapy:  Heparin level 0.3-0.7 units/ml Monitor platelets by anticoagulation protocol: Yes   Plan:  Decrease heparin rate to 1000 units/hr Check 8 hr HL. Daily HL and CBC while on heparin F/u for oral AC plan.   I asked Lyndel Safe, CPhT, Pharmacy Patient Advocate Specialist, to run a benefits check for copay for eliquis vs xarelto vs warfarin.    Thank you for allowing pharmacy to be part of this patients care team. Nicole Cella, [place] Pharmacist [phone] 06/14/2021 9:08 AM  Please check AMION for all [place] phone numbers After 10:00 PM, call [place]

## 2021-06-14 NOTE — Plan of Care (Signed)
  Problem: Education: Goal: Knowledge of General Education information will improve Description: Including pain rating scale, medication(s)/side effects and non-pharmacologic comfort measures Outcome: Progressing   Problem: Health Behavior/Discharge Planning: Goal: Ability to manage health-related needs will improve Outcome: Progressing   Problem: Activity: Goal: Risk for activity intolerance will decrease Outcome: Progressing   

## 2021-06-14 NOTE — Progress Notes (Signed)
ANTICOAGULATION CONSULT NOTE - Follow Up Consult  Pharmacy Consult for heparin Indication: pulmonary embolus  Labs: Recent Labs    06/12/21 1342 06/12/21 2204 06/12/21 2204 06/12/21 2346 06/13/21 1441 06/13/21 1633 06/13/21 2300  HGB 12.3 12.1  --   --   --   --   --   HCT 37.3 35.0*  --   --   --   --   --   PLT 173 157  --   --   --   --   --   APTT  --   --   --   --   --  88*  --   LABPROT  --   --   --   --   --  16.7*  --   INR  --   --   --   --   --  1.4*  --   HEPARINUNFRC  --   --   --   --   --   --  0.22*  CREATININE 1.20* 1.31*  --   --   --   --   --   TROPONINIHS  --  68*   < > 63* 43* 49*  --    < > = values in this interval not displayed.    Assessment: 87yo female subtherapeutic on heparin with initial dosing for PE w/ RHS; no infusion issues or signs of bleeding per RN.  Goal of Therapy:  Heparin level 0.3-0.7 units/ml   Plan:  Will give small heparin bolus of 1000 units and increase heparin infusion by 3 units/kg/hr to 1100 units/hr and check level in 8 hours.    Wynona Neat, PharmD, BCPS  06/14/2021,1:19 AM

## 2021-06-14 NOTE — Progress Notes (Signed)
Progress Note  Patient Name: Shannon Jensen Date of Encounter: 06/14/2021  [facility] Cardiologist: None new  Subjective   Feels great. Denies any chest pain or dyspnea.   Inpatient Medications    Scheduled Meds:  cholecalciferol  2,000 Units Oral Daily   metoprolol tartrate  25 mg Oral BID   multivitamin with minerals  1 tablet Oral Daily   tamoxifen  10 mg Oral Daily   Continuous Infusions:  heparin 1,000 Units/hr (06/14/21 0952)   PRN Meds: acetaminophen, alum & mag hydroxide-simeth, ondansetron **OR** ondansetron (ZOFRAN) IV   Vital Signs    Vitals:   06/14/21 0140 06/14/21 0224 06/14/21 0601 06/14/21 0906  BP:  140/79 116/62 136/68  Pulse:  99 85 92  Resp:  18 20 18   Temp: 98.2 F (36.8 C) 98.9 F (37.2 C) 99.3 F (37.4 C)   TempSrc: Temporal Oral Oral   SpO2:  98% 99% 100%  Weight:  66 kg    Height:  5\' 3"  (1.6 m)      Intake/Output Summary (Last 24 hours) at 06/14/2021 1024 Last data filed at 06/14/2021 0445 Gross per 24 hour  Intake 396.94 ml  Output 300 ml  Net 96.94 ml   Last 3 Weights 06/14/2021 06/12/2021 06/12/2021  Weight (lbs) 145 lb 8 oz 148 lb 13 oz 149 lb  Weight (kg) 65.998 kg 67.5 kg 67.586 kg      Telemetry    NSR. Very short burst of PAT < 2 seconds - Personally Reviewed  ECG    NSR with low voltage - Personally Reviewed  Physical Exam   GEN: No acute distress.   Neck: No JVD Cardiac: RRR, no murmurs, rubs, or gallops.  Respiratory: Clear to auscultation bilaterally. GI: Soft, nontender, non-distended  MS: No edema; No deformity. Neuro:  Nonfocal  Psych: Normal affect   Labs    High Sensitivity Troponin:   Recent Labs  Lab 06/12/21 2204 06/12/21 2346 06/13/21 1441 06/13/21 1633  TROPONINIHS 68* 63* 43* 49*     Chemistry Recent Labs  Lab 06/12/21 1342 06/12/21 2204 06/14/21 0337  NA 136 134* 132*  K 4.6 4.2 4.5  CL 98 102 104  CO2 19* 19* 20*  GLUCOSE 89 102* 97  BUN 19 23 23   CREATININE 1.20*  1.31* 1.25*  CALCIUM 9.4 9.2 8.8*  PROT 7.0  --  5.9*  ALBUMIN 3.9  --  2.9*  AST 25  --  22  ALT 16  --  16  ALKPHOS 41*  --  33*  BILITOT 0.6  --  0.7  GFRNONAA  --  39* 41*  ANIONGAP  --  13 8    Lipids  Recent Labs  Lab 06/12/21 1342  CHOL 211*  TRIG 130  HDL 37*  LABVLDL 24  LDLCALC 150*  CHOLHDL 5.7*    Hematology Recent Labs  Lab 06/12/21 1342 06/12/21 2204 06/14/21 0337  WBC 9.5 8.9 7.6  RBC 4.01 3.82* 3.62*  HGB 12.3 12.1 11.2*  HCT 37.3 35.0* 32.7*  MCV 93 91.6 90.3  MCH 30.7 31.7 30.9  MCHC 33.0 34.6 34.3  RDW 14.5 14.8 14.5  PLT 173 157 150   Thyroid  Recent Labs  Lab 06/12/21 2204  TSH 1.422    BNPNo results for input(s): BNP, PROBNP in the last 168 hours.  DDimer  Recent Labs  Lab 06/13/21 1051  DDIMER >20.00*     Radiology    DG Chest 2 View  Result Date:  06/12/2021 CLINICAL DATA:  Shortness of breath. EXAM: CHEST - 2 VIEW COMPARISON:  None. FINDINGS: The lungs are hyperinflated. Mild, diffuse, chronic appearing increased interstitial lung markings are seen. There is no evidence of a pleural effusion or pneumothorax. A 2.4 cm x 1.8 cm thin walled calcific lesion is seen overlying the lateral aspect of the right lung base. This appears to be within the soft tissues of the right breast on the lateral view. The heart size and mediastinal contours are within normal limits. There is mild to moderate severity levoscoliosis of the lower thoracic spine with multilevel degenerative changes seen within the thoracic spine and bilateral shoulders. IMPRESSION: Chronic changes, as described above, without evidence of acute or active cardiopulmonary disease. Electronically Signed   By: Virgina Norfolk M.D.   On: 06/12/2021 22:41   CT Angio Chest Pulmonary Embolism (PE) W or WO Contrast  Result Date: 06/13/2021 CLINICAL DATA:  Short of breath. Clinical suspicion for pulmonary emboli. EXAM: CT ANGIOGRAPHY CHEST WITH CONTRAST TECHNIQUE: Multidetector CT  imaging of the chest was performed using the standard protocol during bolus administration of intravenous contrast. Multiplanar CT image reconstructions and MIPs were obtained to evaluate the vascular anatomy. CONTRAST:  55mL OMNIPAQUE IOHEXOL 350 MG/ML SOLN COMPARISON:  CT, 12/06/2016.  Chest radiograph from 06/12/2021. FINDINGS: Cardiovascular: Pulmonary arteries are well opacified. There are bilateral pulmonary emboli. A saddle embolus straddles the main pulmonary artery extending into the right and left pulmonary arteries. Nonocclusive pulmonary embolus noted in the apical segmental branch of the right upper lobe. Another small pulmonary embolus lies in the right middle lobe, medial segment. Pulmonary emboli extend into the origins of the left upper lobe anterior and apical segmental branches, as well as the lingular segment branches. Nonocclusive pulmonary emboli at the origin of the lower lobe segmental branches. Heart is normal in size and configuration. RV LV ratio is 1.28. No pericardial effusion. No coronary artery calcifications. Aorta is normal in caliber. Aortic atherosclerosis. Mediastinum/Nodes: Thyroid mildly enlarged and heterogeneous suggesting small nodules. No mediastinal or hilar masses or enlarged lymph nodes. Trachea and esophagus are unremarkable. Lungs/Pleura: Predominantly linear type opacities noted in the dependent lower lobes consistent with atelectasis. Lungs otherwise clear. No pleural effusion. No pneumothorax. Upper Abdomen: No acute abnormality. Musculoskeletal: No acute fracture. No bone lesion. Arthropathic changes of both glenohumeral joints, advanced and more severe on the right. Chronic seroma in the right breast, stable. Review of the MIP images confirms the above findings. IMPRESSION: 1. Bilateral pulmonary emboli as detailed. Positive for acute PE with CT evidence of right heart strain (RV/LV Ratio = 1.28) consistent with at least submassive (intermediate risk) PE. The  presence of right heart strain has been associated with an increased risk of morbidity and mortality. Please refer to the "PE Focused" order set in EPIC. 2. No other acute abnormality within the chest. 3. Aortic atherosclerosis. 4. Thyroid prominent with evidence of small nodules. Patient has had previous thyroid ultrasound and FNA biopsy, last ultrasound dated 07/09/2019. Aortic Atherosclerosis (ICD10-I70.0). Electronically Signed   By: Lajean Manes M.D.   On: 06/13/2021 15:35    Cardiac Studies   Echo pending  Patient Profile     87 y.o. female presents with acute dyspnea due to saddle pulmonary embolus and associated SVT  Assessment & Plan    Acute pulmonary embolus. Patient is clinically doing very well. Hemodynamics are stable and she has normal oxygen levels on no supplementation. On IV heparin. Plan to transition to oral anticoagulation per primary  team. PE is unprovoked. Minimal troponin leak due  to PE.  SVT/PAT secondary to #1. Improved on metoprolol. Echo pending.       For questions or updates, please contact [facility] Please consult [email or web address] for contact info under        Signed, Chrishawn Boley [place], MD  06/14/2021, 10:24 AM

## 2021-06-14 NOTE — TOC Benefit Eligibility Note (Signed)
Patient Teacher, English as a foreign language completed.    The patient is currently admitted and upon discharge could be taking Eliquis 5 mg.  The current 30 day co-pay is, $40.00.   The patient is currently admitted and upon discharge could be taking Xarelto 20 mg.  The current 30 day co-pay is, $40.00.   The patient is currently admitted and upon discharge could be taking Warfarin 5 mg.  The current 30 day co-pay is, $5.40.   The patient is insured through [employer] [place] Patient Advocate Specialist [place] Team Direct Number: [phone]  Fax: [phone]

## 2021-06-14 NOTE — Care Management Obs Status (Signed)
[place] NOTIFICATION   Patient Details  Name: Shannon Jensen MRN: [id number] Date of Birth: Sep 23, 1927   Medicare Observation Status Notification Given:  Yes    Genella Bas, LCSW 06/14/2021, 7:41 PM

## 2021-06-15 DIAGNOSIS — C50212 Malignant neoplasm of upper-inner quadrant of left female breast: Secondary | ICD-10-CM | POA: Diagnosis not present

## 2021-06-15 DIAGNOSIS — I471 Supraventricular tachycardia: Secondary | ICD-10-CM | POA: Diagnosis not present

## 2021-06-15 DIAGNOSIS — I10 Essential (primary) hypertension: Secondary | ICD-10-CM | POA: Diagnosis not present

## 2021-06-15 DIAGNOSIS — Z170 Estrogen receptor positive status [ER+]: Secondary | ICD-10-CM | POA: Diagnosis not present

## 2021-06-15 DIAGNOSIS — R0600 Dyspnea, unspecified: Secondary | ICD-10-CM | POA: Diagnosis not present

## 2021-06-15 DIAGNOSIS — I2692 Saddle embolus of pulmonary artery without acute cor pulmonale: Secondary | ICD-10-CM | POA: Diagnosis not present

## 2021-06-15 LAB — CBC
HCT: 31.8 % — ABNORMAL LOW (ref 36.0–46.0)
Hemoglobin: 10.7 g/dL — ABNORMAL LOW (ref 12.0–15.0)
MCH: 30.7 pg (ref 26.0–34.0)
MCHC: 33.6 g/dL (ref 30.0–36.0)
MCV: 91.4 fL (ref 80.0–100.0)
Platelets: 159 10*3/uL (ref 150–400)
RBC: 3.48 MIL/uL — ABNORMAL LOW (ref 3.87–5.11)
RDW: 14.5 % (ref 11.5–15.5)
WBC: 8.2 10*3/uL (ref 4.0–10.5)
nRBC: 0 % (ref 0.0–0.2)

## 2021-06-15 LAB — BASIC METABOLIC PANEL
Anion gap: 9 (ref 5–15)
BUN: 14 mg/dL (ref 8–23)
CO2: 20 mmol/L — ABNORMAL LOW (ref 22–32)
Calcium: 8.6 mg/dL — ABNORMAL LOW (ref 8.9–10.3)
Chloride: 102 mmol/L (ref 98–111)
Creatinine, Ser: 1 mg/dL (ref 0.44–1.00)
GFR, Estimated: 54 mL/min — ABNORMAL LOW (ref >60)
Glucose, Bld: 101 mg/dL — ABNORMAL HIGH (ref 70–99)
Potassium: 4.4 mmol/L (ref 3.5–5.1)
Sodium: 131 mmol/L — ABNORMAL LOW (ref 135–145)

## 2021-06-15 LAB — HEPARIN LEVEL (UNFRACTIONATED): Heparin Unfractionated: 0.45 IU/mL (ref 0.30–0.70)

## 2021-06-15 MED ORDER — RIVAROXABAN 20 MG PO TABS: 20.0000 mg | ORAL_TABLET | Freq: Every day | ORAL | Status: DC

## 2021-06-15 MED ORDER — RIVAROXABAN 15 MG PO TABS
15.0000 mg | ORAL_TABLET | Freq: Two times a day (BID) | ORAL | Status: DC
  Administered 2021-06-15 – 2021-06-16 (×4): 15 mg via ORAL
  Filled 2021-06-15 (×5): qty 1

## 2021-06-15 MED ORDER — PANTOPRAZOLE SODIUM 40 MG PO TBEC
40.0000 mg | DELAYED_RELEASE_TABLET | Freq: Every day | ORAL | Status: DC
  Administered 2021-06-15 – 2021-06-16 (×2): 40 mg via ORAL
  Filled 2021-06-15 (×2): qty 1

## 2021-06-15 NOTE — Evaluation (Signed)
Occupational Therapy Evaluation Patient Details Name: Shannon Jensen MRN: [id number] DOB: November 11, 1927 Today's Date: 06/15/2021   History of Present Illness Pt is a 87 year old woman admitted on 06/12/21 with SOB and L sided chest pain. + saddle pulmonary embolus and associated SVT. PMH: breast ca, HTN, arthritis, spinal stenosis, anxiety and depression.   Clinical Impression   Pt ambulated with a cane and functioned independently in ADL, light housekeeping and meal prep prior to admission. Presents with pain in back and legs, generalized weakness and impaired standing balance requiring cane and hand held assist for ambulation. She needs set up to total assist for ADL. Family can provide 24 hour care at home. Recommending HHOT.     Recommendations for follow up therapy are one component of a multi-disciplinary discharge planning process, led by the attending physician.  Recommendations may be updated based on patient status, additional functional criteria and insurance authorization.   Follow Up Recommendations  Home health OT;Supervision/Assistance - 24 hour    Equipment Recommendations  None recommended by OT    Recommendations for Other Services       Precautions / Restrictions Precautions Precautions: Fall Restrictions Weight Bearing Restrictions: No      Mobility Bed Mobility Overal bed mobility: Needs Assistance Bed Mobility: Supine to Sit;Sit to Supine     Supine to sit: Min guard;HOB elevated Sit to supine: Min guard   General bed mobility comments: Min guard for safety for bed mobility. Increased time required throughout.    Transfers Overall transfer level: Needs assistance Equipment used: 1 person hand held assist;Straight cane Transfers: Sit to/from Stand Sit to Stand: Min assist         General transfer comment: Min A for lift assist and steadying to stand. Increased time required.    Balance Overall balance assessment: Needs  assistance Sitting-balance support: No upper extremity supported;Feet supported Sitting balance-Leahy Scale: Fair     Standing balance support: Bilateral upper extremity supported;During functional activity Standing balance-Leahy Scale: Poor Standing balance comment: Reliant on B UE support                           ADL either performed or assessed with clinical judgement   ADL Overall ADL's : Needs assistance/impaired Eating/Feeding: Independent   Grooming: Wash/dry hands;Set up;Sitting   Upper Body Bathing: Set up;Sitting   Lower Body Bathing: Minimal assistance;Sit to/from stand   Upper Body Dressing : Set up;Sitting   Lower Body Dressing: Minimal assistance;Sit to/from stand   Toilet Transfer: Minimal assistance;Ambulation;RW   Toileting- Clothing Manipulation and Hygiene: Total assistance;Sit to/from stand       Functional mobility during ADLs: Minimal assistance;Cane       Vision Patient Visual Report: No change from baseline       Perception     Praxis      Pertinent Vitals/Pain Pain Assessment: Faces Faces Pain Scale: Hurts little more Pain Location: back, legs Pain Descriptors / Indicators: Sore;Discomfort Pain Intervention(s): Monitored during session;Repositioned     Hand Dominance Right   Extremity/Trunk Assessment Upper Extremity Assessment Upper Extremity Assessment: Overall WFL for tasks assessed   Lower Extremity Assessment Lower Extremity Assessment: Defer to PT evaluation   Cervical / Trunk Assessment Cervical / Trunk Assessment: Kyphotic   Communication Communication Communication: No difficulties   Cognition Arousal/Alertness: Awake/alert Behavior During Therapy: WFL for tasks assessed/performed Overall Cognitive Status: Within Functional Limits for tasks assessed  General Comments  Pt's son present throughout session    Exercises     Shoulder Instructions       [place] expects to be discharged to:: Private residence Living Arrangements: Alone Available Help at Discharge: Family;Available 24 hours/day Type of Home: House Home Access: Other (comment) (threshhold)     Home Layout: One level     Bathroom Shower/Tub: Teacher, early years/pre: Standard     Home Equipment: Cane - single point;Bedside commode;Walker - 4 wheels          Prior Functioning/Environment Level of Independence: Independent with assistive device(s)        Comments: Uses cane when outside of home. Does not drive. Independent with ADLs        OT Problem List: Decreased strength;Decreased activity tolerance;Impaired balance (sitting and/or standing);Decreased knowledge of use of DME or AE;Pain      OT Treatment/Interventions: Self-care/ADL training;DME and/or AE instruction;Balance training;Patient/family education    OT Goals(Current goals can be found in the care plan section) Acute Rehab OT Goals Patient Stated Goal: to feel better and go home OT Goal Formulation: With patient Time For Goal Achievement: 06/29/21 Potential to Achieve Goals: Good ADL Goals Pt Will Perform Grooming: with min guard assist;standing Pt Will Perform Lower Body Bathing: with min guard assist;sit to/from stand Pt Will Perform Lower Body Dressing: with min guard assist;sit to/from stand Pt Will Transfer to Toilet: with min guard assist;ambulating;bedside commode Pt Will Perform Toileting - Clothing Manipulation and hygiene: with min guard assist;sit to/from stand  OT Frequency: Min 2X/week   Barriers to D/C:            Co-evaluation              AM-PAC OT "6 Clicks" Daily Activity     Outcome Measure Help from another person eating meals?: None Help from another person taking care of personal grooming?: A Little Help from another person toileting, which includes using toliet, bedpan, or urinal?: A Lot Help from another person bathing  (including washing, rinsing, drying)?: A Lot Help from another person to put on and taking off regular upper body clothing?: A Little Help from another person to put on and taking off regular lower body clothing?: A Lot 6 Click Score: 16   End of Session Equipment Utilized During Treatment: Gait belt  Activity Tolerance: Patient tolerated treatment well Patient left: in bed;with call bell/phone within reach  OT Visit Diagnosis: Unsteadiness on feet (R26.81);Other abnormalities of gait and mobility (R26.89);Muscle weakness (generalized) (M62.81);Pain                Time: 1749-4496 OT Time Calculation (min): 47 min Charges:  OT General Charges $OT Visit: 1 Visit OT Evaluation $OT Eval Moderate Complexity: 1 Mod OT Treatments $Self Care/Home Management : 8-22 mins  Nestor Lewandowsky, OTR/L Acute Rehabilitation Services Pager: [phone] Office: [phone]   Shannon Jensen 06/15/2021, 2:14 PM

## 2021-06-15 NOTE — Progress Notes (Signed)
Pt seen and evaluated. Very slow and guarded during mobility tasks using cane and slightly unsteady. Feel she would benefit from continued acute PT prior to d/c home to ensure safety with mobility. Recommending HHPT and RW for d/c. Formal note to follow.   Reuel Derby, PT, DPT  Acute Rehabilitation Services  Pager: [phone] Office: [phone]

## 2021-06-15 NOTE — Plan of Care (Signed)
  Problem: Education: Goal: Knowledge of General Education information will improve Description: Including pain rating scale, medication(s)/side effects and non-pharmacologic comfort measures Outcome: Progressing   Problem: Health Behavior/Discharge Planning: Goal: Ability to manage health-related needs will improve Outcome: Progressing   Problem: Activity: Goal: Risk for activity intolerance will decrease Outcome: Progressing   

## 2021-06-15 NOTE — Progress Notes (Signed)
Progress Note  Patient Name: Shannon Jensen Date of Encounter: 06/15/2021  [facility] Cardiologist: None   Subjective   Feeling well this morning.   Inpatient Medications    Scheduled Meds:  cholecalciferol  2,000 Units Oral Daily   metoprolol tartrate  25 mg Oral BID   multivitamin with minerals  1 tablet Oral Daily   tamoxifen  10 mg Oral Daily   Continuous Infusions:  heparin 950 Units/hr (06/15/21 0616)   PRN Meds: acetaminophen, alum & mag hydroxide-simeth, cyclobenzaprine, ondansetron **OR** ondansetron (ZOFRAN) IV   Vital Signs    Vitals:   06/14/21 2043 06/14/21 2045 06/15/21 0021 06/15/21 0503  BP: (!) 110/97 (!) 143/68 139/64 (!) 128/55  Pulse: 84 86 79 82  Resp:  20 18 18   Temp: 98.2 F (36.8 C)  100.1 F (37.8 C) 99.9 F (37.7 C)  TempSrc: Oral  Oral Oral  SpO2: 100%  99% 97%  Weight:    66.2 kg  Height:        Intake/Output Summary (Last 24 hours) at 06/15/2021 0750 Last data filed at 06/15/2021 0616 Gross per 24 hour  Intake 501.25 ml  Output 1150 ml  Net -648.75 ml   Last 3 Weights 06/15/2021 06/14/2021 06/12/2021  Weight (lbs) 146 lb 145 lb 8 oz 148 lb 13 oz  Weight (kg) 66.225 kg 65.998 kg 67.5 kg      Telemetry    SR, 2 short episodes of PAT - Personally Reviewed  ECG    No new tracing   Physical Exam   GEN: No acute distress.   Neck: No JVD Cardiac: RRR, no murmurs, rubs, or gallops.  Respiratory: Clear to auscultation bilaterally. GI: Soft, nontender, non-distended  MS: No edema; No deformity. Neuro:  Nonfocal  Psych: Normal affect   Labs    High Sensitivity Troponin:   Recent Labs  Lab 06/12/21 2204 06/12/21 2346 06/13/21 1441 06/13/21 1633  TROPONINIHS 68* 63* 43* 49*     Chemistry Recent Labs  Lab 06/12/21 1342 06/12/21 2204 06/14/21 0337 06/15/21 0246  NA 136 134* 132* 131*  K 4.6 4.2 4.5 4.4  CL 98 102 104 102  CO2 19* 19* 20* 20*  GLUCOSE 89 102* 97 101*  BUN 19 23 23 14   CREATININE 1.20*  1.31* 1.25* 1.00  CALCIUM 9.4 9.2 8.8* 8.6*  PROT 7.0  --  5.9*  --   ALBUMIN 3.9  --  2.9*  --   AST 25  --  22  --   ALT 16  --  16  --   ALKPHOS 41*  --  33*  --   BILITOT 0.6  --  0.7  --   GFRNONAA  --  39* 41* 54*  ANIONGAP  --  13 8 9     Lipids  Recent Labs  Lab 06/12/21 1342  CHOL 211*  TRIG 130  HDL 37*  LABVLDL 24  LDLCALC 150*  CHOLHDL 5.7*    Hematology Recent Labs  Lab 06/12/21 2204 06/14/21 0337 06/15/21 0246  WBC 8.9 7.6 8.2  RBC 3.82* 3.62* 3.48*  HGB 12.1 11.2* 10.7*  HCT 35.0* 32.7* 31.8*  MCV 91.6 90.3 91.4  MCH 31.7 30.9 30.7  MCHC 34.6 34.3 33.6  RDW 14.8 14.5 14.5  PLT 157 150 159   Thyroid  Recent Labs  Lab 06/12/21 2204  TSH 1.422    BNPNo results for input(s): BNP, PROBNP in the last 168 hours.  DDimer  Recent Labs  Lab 06/13/21 1051  DDIMER >20.00*     Radiology    CT Angio Chest Pulmonary Embolism (PE) W or WO Contrast  Result Date: 06/13/2021 CLINICAL DATA:  Short of breath. Clinical suspicion for pulmonary emboli. EXAM: CT ANGIOGRAPHY CHEST WITH CONTRAST TECHNIQUE: Multidetector CT imaging of the chest was performed using the standard protocol during bolus administration of intravenous contrast. Multiplanar CT image reconstructions and MIPs were obtained to evaluate the vascular anatomy. CONTRAST:  66mL OMNIPAQUE IOHEXOL 350 MG/ML SOLN COMPARISON:  CT, 12/06/2016.  Chest radiograph from 06/12/2021. FINDINGS: Cardiovascular: Pulmonary arteries are well opacified. There are bilateral pulmonary emboli. A saddle embolus straddles the main pulmonary artery extending into the right and left pulmonary arteries. Nonocclusive pulmonary embolus noted in the apical segmental branch of the right upper lobe. Another small pulmonary embolus lies in the right middle lobe, medial segment. Pulmonary emboli extend into the origins of the left upper lobe anterior and apical segmental branches, as well as the lingular segment branches. Nonocclusive  pulmonary emboli at the origin of the lower lobe segmental branches. Heart is normal in size and configuration. RV LV ratio is 1.28. No pericardial effusion. No coronary artery calcifications. Aorta is normal in caliber. Aortic atherosclerosis. Mediastinum/Nodes: Thyroid mildly enlarged and heterogeneous suggesting small nodules. No mediastinal or hilar masses or enlarged lymph nodes. Trachea and esophagus are unremarkable. Lungs/Pleura: Predominantly linear type opacities noted in the dependent lower lobes consistent with atelectasis. Lungs otherwise clear. No pleural effusion. No pneumothorax. Upper Abdomen: No acute abnormality. Musculoskeletal: No acute fracture. No bone lesion. Arthropathic changes of both glenohumeral joints, advanced and more severe on the right. Chronic seroma in the right breast, stable. Review of the MIP images confirms the above findings. IMPRESSION: 1. Bilateral pulmonary emboli as detailed. Positive for acute PE with CT evidence of right heart strain (RV/LV Ratio = 1.28) consistent with at least submassive (intermediate risk) PE. The presence of right heart strain has been associated with an increased risk of morbidity and mortality. Please refer to the "PE Focused" order set in EPIC. 2. No other acute abnormality within the chest. 3. Aortic atherosclerosis. 4. Thyroid prominent with evidence of small nodules. Patient has had previous thyroid ultrasound and FNA biopsy, last ultrasound dated 07/09/2019. Aortic Atherosclerosis (ICD10-I70.0). Electronically Signed   By: Lajean Manes M.D.   On: 06/13/2021 15:35   ECHOCARDIOGRAM COMPLETE  Result Date: 06/14/2021    ECHOCARDIOGRAM REPORT   Patient Name:   Shannon Jensen Vold Date of Exam: 06/14/2021 Medical Rec #:  [id number]        Height:       63.0 in Accession #:    [id number]       Weight:       145.5 lb Date of Birth:  [birth year]       BSA:          1.689 m Patient Age:    87 years         BP:           136/68 mmHg Patient Gender: F                 HR:           92 bpm. Exam Location:  Inpatient Procedure: 2D Echo, Cardiac Doppler and Color Doppler Indications:    Dyspnea  History:        Patient has no prior history of Echocardiogram examinations.  Pulmonary embolus, Signs/Symptoms:Dyspnea; Risk                 Factors:Hypertension and Dyslipidemia. Breast cancer.  Sonographer:    Dustin Flock RDCS Referring Phys: [place]  1. Left ventricular ejection fraction, by estimation, is 65 to 70%. The left ventricle has normal function. The left ventricle has no regional wall motion abnormalities. There is mild left ventricular hypertrophy. Left ventricular diastolic parameters are consistent with Grade I diastolic dysfunction (impaired relaxation).  2. Right ventricular systolic function is normal. The right ventricular size is normal. There is mildly elevated pulmonary artery systolic pressure.  3. Round, echolucent structure adherent to the RV free wall measuring about 2 cm in diameter, this likely represents a pericardial cyst or less likely a loculated pericardial effusion, however could consider cMRI to further evalute if clinically warranted.  4. The mitral valve is grossly normal. Trivial mitral valve regurgitation.  5. The aortic valve is tricuspid. Aortic valve regurgitation is not visualized.  6. The inferior vena cava is normal in size with greater than 50% respiratory variability, suggesting right atrial pressure of 3 mmHg. Comparison(s): No prior Echocardiogram. FINDINGS  Left Ventricle: Left ventricular ejection fraction, by estimation, is 65 to 70%. The left ventricle has normal function. The left ventricle has no regional wall motion abnormalities. The left ventricular internal cavity size was normal in size. There is  mild left ventricular hypertrophy. Left ventricular diastolic parameters are consistent with Grade I diastolic dysfunction (impaired relaxation). Indeterminate filling pressures.  Right Ventricle: The right ventricular size is normal. No increase in right ventricular wall thickness. Right ventricular systolic function is normal. There is mildly elevated pulmonary artery systolic pressure. The tricuspid regurgitant velocity is 3.19  m/s, and with an assumed right atrial pressure of 3 mmHg, the estimated right ventricular systolic pressure is 87.5 mmHg. Left Atrium: Left atrial size was normal in size. Right Atrium: Right atrial size was normal in size. Pericardium: Round, echolucent structure adherent to the RV free wall measuring about 2 cm in diameter, this likely represents a pericardial cyst or less likely a loculated pericardial effusion, however could consider cMRI to further evalute if clinically warranted. There is no evidence of pericardial effusion. Mitral Valve: The mitral valve is grossly normal. Trivial mitral valve regurgitation. Tricuspid Valve: The tricuspid valve is grossly normal. Tricuspid valve regurgitation is mild. Aortic Valve: The aortic valve is tricuspid. Aortic valve regurgitation is not visualized. Pulmonic Valve: The pulmonic valve was normal in structure. Pulmonic valve regurgitation is not visualized. Aorta: The aortic root and ascending aorta are structurally normal, with no evidence of dilitation. Venous: The inferior vena cava is normal in size with greater than 50% respiratory variability, suggesting right atrial pressure of 3 mmHg. IAS/Shunts: No atrial level shunt detected by color flow Doppler.  LEFT VENTRICLE PLAX 2D LVIDd:         3.50 cm     Diastology LVIDs:         1.70 cm     LV e' medial:    6.85 cm/s LV PW:         1.00 cm     LV E/e' medial:  10.8 LV IVS:        1.10 cm     LV e' lateral:   7.29 cm/s LVOT diam:     2.00 cm     LV E/e' lateral: 10.1 LV SV:         64 LV SV Index:  38 LVOT Area:     3.14 cm  LV Volumes (MOD) LV vol d, MOD A4C: 53.5 ml LV vol s, MOD A4C: 17.2 ml LV SV MOD A4C:     53.5 ml RIGHT VENTRICLE RV Basal diam:  2.90 cm RV  S prime:     10.40 cm/s TAPSE (M-mode): 1.2 cm LEFT ATRIUM             Index       RIGHT ATRIUM           Index LA diam:        2.50 cm 1.48 cm/m  RA Area:     10.50 cm LA Vol (A2C):   35.8 ml 21.19 ml/m RA Volume:   21.30 ml  12.61 ml/m LA Vol (A4C):   29.1 ml 17.23 ml/m LA Biplane Vol: 32.8 ml 19.42 ml/m  AORTIC VALVE LVOT Vmax:   93.30 cm/s LVOT Vmean:  67.400 cm/s LVOT VTI:    0.204 m  AORTA Ao Root diam: 2.50 cm MITRAL VALVE               TRICUSPID VALVE MV Area (PHT): 5.02 cm    TR Peak grad:   40.7 mmHg MV Decel Time: 151 msec    TR Vmax:        319.00 cm/s MV E velocity: 73.70 cm/s MV A velocity: 87.40 cm/s  SHUNTS MV E/A ratio:  0.84        Systemic VTI:  0.20 m                            Systemic Diam: 2.00 cm Lyman Bishop MD Electronically signed by Lyman Bishop MD Signature Date/Time: 06/14/2021/11:45:04 AM    Final     Cardiac Studies   Echo: 06/14/21  IMPRESSIONS     1. Left ventricular ejection fraction, by estimation, is 65 to 70%. The  left ventricle has normal function. The left ventricle has no regional  wall motion abnormalities. There is mild left ventricular hypertrophy.  Left ventricular diastolic parameters  are consistent with Grade I diastolic dysfunction (impaired relaxation).   2. Right ventricular systolic function is normal. The right ventricular  size is normal. There is mildly elevated pulmonary artery systolic  pressure.   3. Round, echolucent structure adherent to the RV free wall measuring  about 2 cm in diameter, this likely represents a pericardial cyst or less  likely a loculated pericardial effusion, however could consider cMRI to  further evalute if clinically  warranted.   4. The mitral valve is grossly normal. Trivial mitral valve  regurgitation.   5. The aortic valve is tricuspid. Aortic valve regurgitation is not  visualized.   6. The inferior vena cava is normal in size with greater than 50%  respiratory variability, suggesting right  atrial pressure of 3 mmHg.   Comparison(s): No prior Echocardiogram.   FINDINGS   Left Ventricle: Left ventricular ejection fraction, by estimation, is 65  to 70%. The left ventricle has normal function. The left ventricle has no  regional wall motion abnormalities. The left ventricular internal cavity  size was normal in size. There is   mild left ventricular hypertrophy. Left ventricular diastolic parameters  are consistent with Grade I diastolic dysfunction (impaired relaxation).  Indeterminate filling pressures.   Right Ventricle: The right ventricular size is normal. No increase in  right ventricular wall thickness. Right ventricular systolic function is  normal. There is  mildly elevated pulmonary artery systolic pressure. The  tricuspid regurgitant velocity is 3.19   m/s, and with an assumed right atrial pressure of 3 mmHg, the estimated  right ventricular systolic pressure is 08.6 mmHg.   Left Atrium: Left atrial size was normal in size.   Right Atrium: Right atrial size was normal in size.   Pericardium: Round, echolucent structure adherent to the RV free wall  measuring about 2 cm in diameter, this likely represents a pericardial  cyst or less likely a loculated pericardial effusion, however could  consider cMRI to further evalute if  clinically warranted. There is no evidence of pericardial effusion.   Mitral Valve: The mitral valve is grossly normal. Trivial mitral valve  regurgitation.   Tricuspid Valve: The tricuspid valve is grossly normal. Tricuspid valve  regurgitation is mild.   Aortic Valve: The aortic valve is tricuspid. Aortic valve regurgitation is  not visualized.   Pulmonic Valve: The pulmonic valve was normal in structure. Pulmonic valve  regurgitation is not visualized.   Aorta: The aortic root and ascending aorta are structurally normal, with  no evidence of dilitation.   Venous: The inferior vena cava is normal in size with greater than 50%   respiratory variability, suggesting right atrial pressure of 3 mmHg.   IAS/Shunts: No atrial level shunt detected by color flow Doppler.   Patient Profile     87 y.o. female with PMH of Breast Ca, HTN, anxiety who was admitted with dyspnea and chest pain. Found to have a PE.   Assessment & Plan    Pulmonary Embolus: overall doing quite well. She is maintaining O2 sats on RA. Still on IV heparin this morning with plans to transition to oral anticoagulation. Echo with EF of 65-70% with normal RV.  -- per primary  SVT: in the setting of acute PE. Rates are stable with metoprolol  Remote hx of Breast Ca  For questions or updates, please contact [place] Please consult [email or web address] for contact info under        Signed, Reino Bellis, NP  06/15/2021, 7:50 AM

## 2021-06-15 NOTE — Progress Notes (Addendum)
ANTICOAGULATION CONSULT NOTE -follow up  Pharmacy Consult for heparin Indication: chest pain/ACS/possible PE/possible A flutter  Allergies  Allergen Reactions   Celecoxib Other (See Comments)    Unknown   Erythromycin Other (See Comments)    Unknown   Nabumetone Other (See Comments)    Unknown   Nsaids Other (See Comments)    Unknown Unknown    Oxycodone-Acetaminophen Other (See Comments)   Tyloxapol    Vioxx [Rofecoxib] Other (See Comments)    Unknown    Patient Measurements: Height: 5\' 3"  (160 cm) Weight: 66.2 kg (146 lb) IBW/kg (Calculated) : 52.4 Heparin Dosing Weight: 66.1 kg  Vital Signs: Temp: 99.9 F (37.7 C) (10/06 0503) Temp Source: Oral (10/06 0503) BP: 128/55 (10/06 0503) Pulse Rate: 82 (10/06 0503)  Labs: Recent Labs    06/12/21 2204 06/12/21 2346 06/13/21 1441 06/13/21 1633 06/13/21 2300 06/14/21 0337 06/14/21 0750 06/14/21 1807 06/15/21 0246  HGB 12.1  --   --   --   --  11.2*  --   --  10.7*  HCT 35.0*  --   --   --   --  32.7*  --   --  31.8*  PLT 157  --   --   --   --  150  --   --  159  APTT  --   --   --  88*  --   --   --   --   --   LABPROT  --   --   --  16.7*  --   --   --   --   --   INR  --   --   --  1.4*  --   --   --   --   --   HEPARINUNFRC  --   --   --   --    < >  --  0.86* 0.71* 0.45  CREATININE 1.31*  --   --   --   --  1.25*  --   --  1.00  TROPONINIHS 68* 63* 43* 49*  --   --   --   --   --    < > = values in this interval not displayed.     Estimated Creatinine Clearance: 34.2 mL/min (by C-G formula based on SCr of 1 mg/dL).   Medical History: Past Medical History:  Diagnosis Date   Anxiety and depression    Arthritis    Breast cancer ([place]) 08/2008   Right Breast Cancer   Breast cancer ([place]) 2019   Left Breast Cancer   Chronic headaches    Cystocele    Diverticulosis    Family history of breast cancer    Family history of colon cancer    Family history of pancreatic cancer    Family history of  prostate cancer    GERD (gastroesophageal reflux disease)    H/O vitamin D deficiency    HLD (hyperlipidemia)    Hypertension    Osteoarthritis    Osteoporosis    Pelvic relaxation    Personal history of radiation therapy 2009   Right Breast Cancer   Urinary tract infection 2014    Assessment:  87 yo F with chest pain and DOE. No Hx of CAD. D-dimer elevated > 20. Pharmacy consulted to start heparin for PE.   Heparin level therapeutic, CBC stable.  Goal of Therapy:  Heparin level 0.3-0.7 units/ml Monitor platelets by anticoagulation protocol: Yes   Plan:  Continue heparin 950 units/h Daily heparin level and CBC  ADDENDUM: Pt to transition to Xarelto. Stop heparin, give Xarelto 15mg  BID x21d then 20mg  daily.     [name], PharmD, BCPS, [facility] Clinical Pharmacist [phone] Please check AMION for all [place] numbers 06/15/2021

## 2021-06-15 NOTE — Progress Notes (Signed)
Mobility Specialist Progress Note    06/15/21 1555  Mobility  Activity Ambulated in room  Level of Assistance Minimal assist, patient does 75% or more  Assistive Device Cane  Distance Ambulated (ft) 35 ft  Mobility Ambulated with assistance in room  Mobility Response Tolerated fair  Mobility performed by Mobility specialist  Bed Position Chair  $Mobility charge 1 Mobility   Pt received in bed and agreeable. C/o BLE and back pain. Gait is very slow. Pt expressed need for physical therapy and help to be mobile at home. Returned to chair with call bell in reach.   Hildred Alamin Mobility Specialist  Mobility Specialist Phone: [phone]

## 2021-06-15 NOTE — Progress Notes (Signed)
PT Evaluation Note   06/15/21 1133  PT Visit Information  Last PT Received On 06/15/21  Assistance Needed +1  History of Present Illness Pt is a 87 y/o female  Precautions  Precautions Fall  Restrictions  Weight Bearing Restrictions No  Home Living  Family/patient expects to be discharged to: Private residence  Living Arrangements Alone  Available Help at Discharge Family;Available 24 hours/day  Type of Home House  Home Access Other (comment) (threshold)  Home Layout One level  Bathroom Shower/Tub Tub/shower unit  Research officer, [employer] - single point;BSC;Walker - 4 wheels  Prior Function  Level of Independence Independent with assistive device(s)  Comments Uses cane when outside of home. Does not drive. Independent with ADLs  Communication  Communication No difficulties  Pain Assessment  Pain Assessment Faces  Faces Pain Scale 6  Pain Location BLE and back  Pain Descriptors / Indicators Guarding;Grimacing  Pain Intervention(s) Monitored during session;Limited activity within patient's tolerance;Repositioned  Cognition  Arousal/Alertness Awake/alert  Behavior During Therapy WFL for tasks assessed/performed  Overall Cognitive Status Within Functional Limits for tasks assessed  Upper Extremity Assessment  Upper Extremity Assessment Defer to OT evaluation  Lower Extremity Assessment  Lower Extremity Assessment Generalized weakness (reports arthritis throughout BLE)  Cervical / Trunk Assessment  Cervical / Trunk Assessment Kyphotic  Bed Mobility  Overal bed mobility Needs Assistance  Bed Mobility Supine to Sit;Sit to Supine  Supine to sit Min guard;HOB elevated  Sit to supine Min guard  General bed mobility comments Min guard for safety for bed mobility. Increased time required throughout.  Transfers  Overall transfer level Needs assistance  Equipment used 1 person hand held assist;Straight cane  Transfers Sit to/from Stand  Sit to Stand Min  assist  General transfer comment Min A for lift assist and steadying to stand. Increased time required.  Ambulation/Gait  Ambulation/Gait assistance Min assist;Min guard  Gait Distance (Feet) 25 Feet  Assistive device 1 person hand held assist;Straight cane  Gait Pattern/deviations Step-through pattern;Decreased stride length  General Gait Details Very slow, guarded gait. Mobility limited to within the room secondary to BLE leg pain. Educated about using RW for mobility for increased safety at home.  Gait velocity Decreased  Balance  Overall balance assessment Needs assistance  Sitting-balance support No upper extremity supported;Feet supported  Sitting balance-Leahy Scale Fair  Standing balance support Bilateral upper extremity supported;During functional activity  Standing balance-Leahy Scale Poor  Standing balance comment Reliant on UE support  General Comments  General comments (skin integrity, edema, etc.) Pt's son present throughout session  PT - End of Session  Equipment Utilized During Treatment Gait belt  Activity Tolerance Patient limited by fatigue;Patient limited by pain  Patient left in bed;with call bell/phone within reach;with family/visitor present;with nursing/sitter in room  Nurse Communication Mobility status  PT Assessment  PT Recommendation/Assessment Patient needs continued PT services  PT Visit Diagnosis Unsteadiness on feet (R26.81);Muscle weakness (generalized) (M62.81)  PT Problem List Decreased strength;Decreased balance;Decreased activity tolerance;Decreased mobility;Decreased knowledge of use of DME;Decreased knowledge of precautions  PT Plan  PT Frequency (ACUTE ONLY) Min 3X/week  PT Treatment/Interventions (ACUTE ONLY) Gait training;DME instruction;Stair training;Functional mobility training;Therapeutic exercise;Balance training;Therapeutic activities;Patient/family education  AM-PAC PT "6 Clicks" Mobility Outcome Measure (Version 2)  Help needed turning  from your back to your side while in a flat bed without using bedrails? 4  Help needed moving from lying on your back to sitting on the side of a flat bed without using bedrails?  3  Help needed moving to and from a bed to a chair (including a wheelchair)? 3  Help needed standing up from a chair using your arms (e.g., wheelchair or bedside chair)? 3  Help needed to walk in hospital room? 3  Help needed climbing 3-5 steps with a railing?  2  6 Click Score 18  Consider Recommendation of Discharge To: Home with [facility]  Progressive Mobility  What is the highest level of mobility based on the progressive mobility assessment? Level 4 (Walks with assist in room) - Balance while marching in place and cannot step forward and back - Complete  Mobility Ambulated with assistance in room  PT Recommendation  Follow Up Recommendations Home health PT;Supervision for mobility/OOB  PT equipment Rolling walker with 5" wheels  Individuals Consulted  Consulted and Agree with Results and Recommendations Patient;Family member/caregiver  Family Member Consulted son  Acute Rehab PT Goals  Patient Stated Goal to feel better and go home  PT Goal Formulation With patient/family  Time For Goal Achievement 06/29/21  Potential to Achieve Goals Good  PT Time Calculation  PT Start Time (ACUTE ONLY) 1131  PT Stop Time (ACUTE ONLY) 1150  PT Time Calculation (min) (ACUTE ONLY) 19 min  PT General Charges  $$ ACUTE PT VISIT 1 Visit  PT Evaluation  $PT Eval Moderate Complexity 1 Mod   Pt admitted secondary to problem above with deficits above. Pt very slow and guarded during mobility tasks. Reporting increased pain from arthritis in BLE. Min to min guard A for mobility tasks. Educated about using RW at home to increase safety. Pt's son reports family can provide assist as needed. Feel pt would benefit from continued acute PT prior to return home to increase independence and safety with mobility. Will continue to follow acutely.    Reuel Derby, PT, DPT  Acute Rehabilitation Services  Pager: [phone] Office: [phone]

## 2021-06-15 NOTE — Discharge Instructions (Signed)
Information on my medicine - XARELTO (rivaroxaban)  This medication education was reviewed with me or my healthcare representative as part of my discharge preparation.  The pharmacist that spoke with me during my hospital stay was:  Einar Grad, [facility]  WHY WAS [place]? Xarelto was prescribed to treat blood clots that may have been found in the veins of your legs (deep vein thrombosis) or in your lungs (pulmonary embolism) and to reduce the risk of them occurring again.  What do you need to know about Xarelto? The starting dose is one 15 mg tablet taken TWICE daily with food for the FIRST 21 DAYS then on (enter date)  07/06/21  the dose is changed to one 20 mg tablet taken ONCE A DAY with your evening meal.  DO NOT stop taking Xarelto without talking to the health care provider who prescribed the medication.  Refill your prescription for 20 mg tablets before you run out.  After discharge, you should have regular check-up appointments with your healthcare provider that is prescribing your Xarelto.  In the future your dose may need to be changed if your kidney function changes by a significant amount.  What do you do if you miss a dose? If you are taking Xarelto TWICE DAILY and you miss a dose, take it as soon as you remember. You may take two 15 mg tablets (total 30 mg) at the same time then resume your regularly scheduled 15 mg twice daily the next day.  If you are taking Xarelto ONCE DAILY and you miss a dose, take it as soon as you remember on the same day then continue your regularly scheduled once daily regimen the next day. Do not take two doses of Xarelto at the same time.   Important Safety Information Xarelto is a blood thinner medicine that can cause bleeding. You should call your healthcare provider right away if you experience any of the following: Bleeding from an injury or your nose that does not stop. Unusual colored urine (red or dark brown) or  unusual colored stools (red or black). Unusual bruising for unknown reasons. A serious fall or if you hit your head (even if there is no bleeding).  Some medicines may interact with Xarelto and might increase your risk of bleeding while on Xarelto. To help avoid this, consult your healthcare provider or pharmacist prior to using any new prescription or non-prescription medications, including herbals, vitamins, non-steroidal anti-inflammatory drugs (NSAIDs) and supplements.  This website has more information on Xarelto: [email or web address]

## 2021-06-15 NOTE — Progress Notes (Addendum)
[facility]  PROGRESS NOTE  [name]:[id number] DOB: 09/22/1927 DOA: 06/12/2021 PCP: [name], MD   Brief HPI:   87 year old female with remote history of breast cancer on tamoxifen, hypertension, anxiety, depression presented to ED with complaints of left lateral wall chest pain, dyspnea exertion and tachycardia.  In the ED she was found to have elevated D-dimer, mildly elevated high-sensitivity troponin.  CTA chest was noted to be submassive PE.    Subjective   Patient seen and examined, denies shortness of breath.  Apprehensive about going home.  Feels that she is weak.   Assessment/Plan:      Submassive pulmonary embolism -Concern for right heart strain on CT chest -Echocardiogram showed no right heart strain -We will switch from IV heparin to Xarelto -Patient is not requiring oxygen  SVT -Resolved after starting on metoprolol -Cardiology has signed off  Pericardial cyst -2 cm pericardial cyst seen on echocardiogram -Patient is asymptomatic; no intervention needed as per cardiology  Thyroid nodule -Will need endocrinology referral as outpatient  Hypertension -Blood pressure is controlled -Continue metoprolol  History of breast cancer -Continue tamoxifen      Scheduled medications:    cholecalciferol  2,000 Units Oral Daily   metoprolol tartrate  25 mg Oral BID   multivitamin with minerals  1 tablet Oral Daily   pantoprazole  40 mg Oral Q1200   Rivaroxaban  15 mg Oral BID WC   Followed by   Derrill Memo ON 07/06/2021] rivaroxaban  20 mg Oral Q supper   tamoxifen  10 mg Oral Daily     Data Reviewed:   CBG:  No results for input(s): GLUCAP in the last 168 hours.  SpO2: 99 %    Vitals:   06/14/21 2045 06/15/21 0021 06/15/21 0503 06/15/21 1150  BP: (!) 143/68 139/64 (!) 128/55 (!) 142/80  Pulse: 86 79 82 75  Resp: 20 18 18 20   Temp:  100.1 F (37.8 C) 99.9 F (37.7 C) 99.5 F (37.5 C)  TempSrc:  Oral Oral Oral  SpO2:   99% 97% 99%  Weight:   66.2 kg   Height:         Intake/Output Summary (Last 24 hours) at 06/15/2021 1717 Last data filed at 06/15/2021 1645 Gross per 24 hour  Intake 990.59 ml  Output 1350 ml  Net -359.41 ml    10/04 1901 - 10/06 0700 In: 898.2 [P.O.:480; I.V.:418.2] Out: 1450 [Urine:1450]  Filed Weights   06/12/21 2152 06/14/21 0224 06/15/21 0503  Weight: 67.5 kg 66 kg 66.2 kg    Data Reviewed: Basic Metabolic Panel: Recent Labs  Lab 06/12/21 1342 06/12/21 2204 06/14/21 0337 06/15/21 0246  NA 136 134* 132* 131*  K 4.6 4.2 4.5 4.4  CL 98 102 104 102  CO2 19* 19* 20* 20*  GLUCOSE 89 102* 97 101*  BUN 19 23 23 14   CREATININE 1.20* 1.31* 1.25* 1.00  CALCIUM 9.4 9.2 8.8* 8.6*   Liver Function Tests: Recent Labs  Lab 06/12/21 1342 06/14/21 0337  AST 25 22  ALT 16 16  ALKPHOS 41* 33*  BILITOT 0.6 0.7  PROT 7.0 5.9*  ALBUMIN 3.9 2.9*   No results for input(s): LIPASE, AMYLASE in the last 168 hours. No results for input(s): AMMONIA in the last 168 hours. CBC: Recent Labs  Lab 06/12/21 1342 06/12/21 2204 06/14/21 0337 06/15/21 0246  WBC 9.5 8.9 7.6 8.2  NEUTROABS 5.5  --   --   --   HGB 12.3  12.1 11.2* 10.7*  HCT 37.3 35.0* 32.7* 31.8*  MCV 93 91.6 90.3 91.4  PLT 173 157 150 159   Cardiac Enzymes: No results for input(s): CKTOTAL, CKMB, CKMBINDEX, TROPONINI in the last 168 hours. BNP (last 3 results) No results for input(s): BNP in the last 8760 hours.  ProBNP (last 3 results) No results for input(s): PROBNP in the last 8760 hours.  CBG: No results for input(s): GLUCAP in the last 168 hours.     Radiology Reports  ECHOCARDIOGRAM COMPLETE  Result Date: 06/14/2021    ECHOCARDIOGRAM REPORT   Patient Name:   Shannon Jensen [place] Date of Exam: 06/14/2021 Medical Rec #:  [id number]        Height:       63.0 in Accession #:    [id number]       Weight:       145.5 lb Date of Birth:  May 16, 1928       BSA:          1.689 m Patient Age:    45 years          BP:           136/68 mmHg Patient Gender: F                HR:           92 bpm. Exam Location:  Inpatient Procedure: 2D Echo, Cardiac Doppler and Color Doppler Indications:    Dyspnea  History:        Patient has no prior history of Echocardiogram examinations.                 Pulmonary embolus, Signs/Symptoms:Dyspnea; Risk                 Factors:Hypertension and Dyslipidemia. Breast cancer.  Sonographer:    Dustin Flock RDCS Referring Phys: [place]  1. Left ventricular ejection fraction, by estimation, is 65 to 70%. The left ventricle has normal function. The left ventricle has no regional wall motion abnormalities. There is mild left ventricular hypertrophy. Left ventricular diastolic parameters are consistent with Grade I diastolic dysfunction (impaired relaxation).  2. Right ventricular systolic function is normal. The right ventricular size is normal. There is mildly elevated pulmonary artery systolic pressure.  3. Round, echolucent structure adherent to the RV free wall measuring about 2 cm in diameter, this likely represents a pericardial cyst or less likely a loculated pericardial effusion, however could consider cMRI to further evalute if clinically warranted.  4. The mitral valve is grossly normal. Trivial mitral valve regurgitation.  5. The aortic valve is tricuspid. Aortic valve regurgitation is not visualized.  6. The inferior vena cava is normal in size with greater than 50% respiratory variability, suggesting right atrial pressure of 3 mmHg. Comparison(s): No prior Echocardiogram. FINDINGS  Left Ventricle: Left ventricular ejection fraction, by estimation, is 65 to 70%. The left ventricle has normal function. The left ventricle has no regional wall motion abnormalities. The left ventricular internal cavity size was normal in size. There is  mild left ventricular hypertrophy. Left ventricular diastolic parameters are consistent with Grade I diastolic dysfunction (impaired  relaxation). Indeterminate filling pressures. Right Ventricle: The right ventricular size is normal. No increase in right ventricular wall thickness. Right ventricular systolic function is normal. There is mildly elevated pulmonary artery systolic pressure. The tricuspid regurgitant velocity is 3.19  m/s, and with an assumed right atrial pressure of 3 mmHg, the estimated right ventricular systolic pressure  is 43.7 mmHg. Left Atrium: Left atrial size was normal in size. Right Atrium: Right atrial size was normal in size. Pericardium: Round, echolucent structure adherent to the RV free wall measuring about 2 cm in diameter, this likely represents a pericardial cyst or less likely a loculated pericardial effusion, however could consider cMRI to further evalute if clinically warranted. There is no evidence of pericardial effusion. Mitral Valve: The mitral valve is grossly normal. Trivial mitral valve regurgitation. Tricuspid Valve: The tricuspid valve is grossly normal. Tricuspid valve regurgitation is mild. Aortic Valve: The aortic valve is tricuspid. Aortic valve regurgitation is not visualized. Pulmonic Valve: The pulmonic valve was normal in structure. Pulmonic valve regurgitation is not visualized. Aorta: The aortic root and ascending aorta are structurally normal, with no evidence of dilitation. Venous: The inferior vena cava is normal in size with greater than 50% respiratory variability, suggesting right atrial pressure of 3 mmHg. IAS/Shunts: No atrial level shunt detected by color flow Doppler.  LEFT VENTRICLE PLAX 2D LVIDd:         3.50 cm     Diastology LVIDs:         1.70 cm     LV e' medial:    6.85 cm/s LV PW:         1.00 cm     LV E/e' medial:  10.8 LV IVS:        1.10 cm     LV e' lateral:   7.29 cm/s LVOT diam:     2.00 cm     LV E/e' lateral: 10.1 LV SV:         64 LV SV Index:   38 LVOT Area:     3.14 cm  LV Volumes (MOD) LV vol d, MOD A4C: 53.5 ml LV vol s, MOD A4C: 17.2 ml LV SV MOD A4C:     53.5  ml RIGHT VENTRICLE RV Basal diam:  2.90 cm RV S prime:     10.40 cm/s TAPSE (M-mode): 1.2 cm LEFT ATRIUM             Index       RIGHT ATRIUM           Index LA diam:        2.50 cm 1.48 cm/m  RA Area:     10.50 cm LA Vol (A2C):   35.8 ml 21.19 ml/m RA Volume:   21.30 ml  12.61 ml/m LA Vol (A4C):   29.1 ml 17.23 ml/m LA Biplane Vol: 32.8 ml 19.42 ml/m  AORTIC VALVE LVOT Vmax:   93.30 cm/s LVOT Vmean:  67.400 cm/s LVOT VTI:    0.204 m  AORTA Ao Root diam: 2.50 cm MITRAL VALVE               TRICUSPID VALVE MV Area (PHT): 5.02 cm    TR Peak grad:   40.7 mmHg MV Decel Time: 151 msec    TR Vmax:        319.00 cm/s MV E velocity: 73.70 cm/s MV A velocity: 87.40 cm/s  SHUNTS MV E/A ratio:  0.84        Systemic VTI:  0.20 m                            Systemic Diam: 2.00 cm Lyman Bishop MD Electronically signed by Lyman Bishop MD Signature Date/Time: 06/14/2021/11:45:04 AM    Final        Antibiotics:  Anti-infectives (From admission, onward)    None         DVT prophylaxis: Xarelto  Code Status: Full code  Family Communication: Discussed with patient's son at bedside   Consultants: Cardiology  Procedures:     Objective    Physical Examination:  General-appears in no acute distress Heart-S1-S2, regular, no murmur auscultated Lungs-clear to auscultation bilaterally, no wheezing or crackles auscultated Abdomen-soft, nontender, no organomegaly Extremities-no edema in the lower extremities Neuro-alert, oriented x3, no focal deficit noted  Status is: Inpatient  Dispo: The patient is from: Home              Anticipated d/c is to: Home              Anticipated d/c date is: 06/16/2021              Patient currently not stable for discharge  Barrier to discharge-pending PT evaluation  COVID-19 Labs  Recent Labs    06/13/21 1051  DDIMER >20.00*    Lab Results  Component Value Date   [place] NEGATIVE 06/14/2021            Recent Results (from the past 240  hour(s))  Resp Panel by RT-PCR (Flu A&B, Covid) Nasopharyngeal Swab     Status: None   Collection Time: 06/14/21  1:29 AM   Specimen: Nasopharyngeal Swab; Nasopharyngeal(NP) swabs in vial transport medium  Result Value Ref Range Status   SARS Coronavirus 2 by RT PCR NEGATIVE NEGATIVE Final    Comment: (NOTE) SARS-CoV-2 target nucleic acids are NOT DETECTED.  The SARS-CoV-2 RNA is generally detectable in upper respiratory specimens during the acute phase of infection. The lowest concentration of SARS-CoV-2 viral copies this assay can detect is 138 copies/mL. A negative result does not preclude SARS-Cov-2 infection and should not be used as the sole basis for treatment or other patient management decisions. A negative result may occur with  improper specimen collection/handling, submission of specimen other than nasopharyngeal swab, presence of viral mutation(s) within the areas targeted by this assay, and inadequate number of viral copies(<138 copies/mL). A negative result must be combined with clinical observations, patient history, and epidemiological information. The expected result is Negative.  Fact Sheet for Patients:  EntrepreneurPulse.com.au  Fact Sheet for Healthcare Providers:  IncredibleEmployment.be  This test is no t yet approved or cleared by the [place] FDA and  has been authorized for detection and/or diagnosis of SARS-CoV-2 by FDA under an Emergency Use Authorization (EUA). This EUA will remain  in effect (meaning this test can be used) for the duration of the COVID-19 declaration under Section 564(b)(1) of the Act, 21 U.S.C.section 360bbb-3(b)(1), unless the authorization is terminated  or revoked sooner.       Influenza A by PCR NEGATIVE NEGATIVE Final   Influenza B by PCR NEGATIVE NEGATIVE Final    Comment: (NOTE) The Xpert Xpress SARS-CoV-2/FLU/RSV plus assay is intended as an aid in the diagnosis of influenza from  Nasopharyngeal swab specimens and should not be used as a sole basis for treatment. Nasal washings and aspirates are unacceptable for Xpert Xpress SARS-CoV-2/FLU/RSV testing.  Fact Sheet for Patients: EntrepreneurPulse.com.au  Fact Sheet for Healthcare Providers: IncredibleEmployment.be  This test is not yet approved or cleared by the [place] FDA and has been authorized for detection and/or diagnosis of SARS-CoV-2 by FDA under an Emergency Use Authorization (EUA). This EUA will remain in effect (meaning this test can be used) for the duration of the COVID-19 declaration under  Section 564(b)(1) of the Act, 21 U.S.C. section 360bbb-3(b)(1), unless the authorization is terminated or revoked.  Performed at [facility], [place] [address], [place], [place] [address]     [facility] If 7PM-7AM, please contact night-coverage at [email or web address], Office  [phone]   06/15/2021, 5:17 PM  LOS: 0 days

## 2021-06-16 DIAGNOSIS — I10 Essential (primary) hypertension: Secondary | ICD-10-CM | POA: Diagnosis not present

## 2021-06-16 DIAGNOSIS — C50212 Malignant neoplasm of upper-inner quadrant of left female breast: Secondary | ICD-10-CM | POA: Diagnosis not present

## 2021-06-16 DIAGNOSIS — I2692 Saddle embolus of pulmonary artery without acute cor pulmonale: Secondary | ICD-10-CM | POA: Diagnosis not present

## 2021-06-16 DIAGNOSIS — Z170 Estrogen receptor positive status [ER+]: Secondary | ICD-10-CM | POA: Diagnosis not present

## 2021-06-16 DIAGNOSIS — I471 Supraventricular tachycardia: Secondary | ICD-10-CM | POA: Diagnosis not present

## 2021-06-16 DIAGNOSIS — R0600 Dyspnea, unspecified: Secondary | ICD-10-CM | POA: Diagnosis not present

## 2021-06-16 LAB — CBC
HCT: 30.6 % — ABNORMAL LOW (ref 36.0–46.0)
Hemoglobin: 10.3 g/dL — ABNORMAL LOW (ref 12.0–15.0)
MCH: 30.6 pg (ref 26.0–34.0)
MCHC: 33.7 g/dL (ref 30.0–36.0)
MCV: 90.8 fL (ref 80.0–100.0)
Platelets: 171 10*3/uL (ref 150–400)
RBC: 3.37 MIL/uL — ABNORMAL LOW (ref 3.87–5.11)
RDW: 14.1 % (ref 11.5–15.5)
WBC: 7 10*3/uL (ref 4.0–10.5)
nRBC: 0 % (ref 0.0–0.2)

## 2021-06-16 MED ORDER — RIVAROXABAN (XARELTO) VTE STARTER PACK (15 & 20 MG): ORAL_TABLET | ORAL | 0 refills | Status: DC

## 2021-06-16 MED ORDER — METOPROLOL TARTRATE 25 MG PO TABS: 25.0000 mg | ORAL_TABLET | Freq: Two times a day (BID) | ORAL | 2 refills | Status: DC

## 2021-06-16 NOTE — TOC Transition Note (Addendum)
Transition of Care [facility]) - CM/SW Discharge Note   Patient Details  Name: Shannon Jensen MRN: [id number] Date of Birth: 1928-04-12  [facility] CM/SW Contact:  Curlene Labrum, RN Phone Number: 06/16/2021, 10:28 AM   Clinical Narrative:    Case management met with the patient and son in the room to discuss transitions of care to home.  The patient plans to return home today.  The patient states that she has four adult children that assist her at the home and plans to have her daughter transport her home by car today after 5 pm.  The patient was given Medicare choice regarding home health and declined since her home is having construction and prefers referral to Outpatient PT.  Order placed for outpatient PT and reminder placed in the discharge instructions.  Dr. Darrick Meigs and nursing/family are aware.  The patient plans to follow up with PCP.  The patient was given Xarelto card for medication assistance at [facility].  CM will follow for discharge to home today.  The patient is set up for all outpatient services and can discharge once family arrives after 5 pm.  Transport home by car today.  06/16/2021 1438 - I spoke with the patient and she has rollator at home and wants an upright rollator.  I called [employer] and they do not carry the specialty rollator and son was notified to order the rollator off [address] or [address]  He confirmed this and the patient is aware.   Final next level of care: OP Rehab Barriers to Discharge: No Barriers Identified (Patient requests that her daughter pick her up for home today after 5 pm..)   Patient Goals and CMS Choice Patient states their goals for this hospitalization and ongoing recovery are:: Patient wants to get better at go home today. CMS Medicare.gov Compare Post Acute Care list provided to:: Patient    Discharge Placement  Patient plans to discharge home with family support today after 5 pm with daughter - [name] (retired).                      Discharge Plan and Services In-house Referral: PCP / [facility] Discharge Planning Services: CM Consult Post Acute Care Choice: Durable Medical Equipment (Patient declined home health and RW)          DME Arranged:  (Patient declined rolling walker for home.)         HH Arranged:  (Patient declined home health and requests outpatient PT.)          Social Determinants of Health (SDOH) Interventions     Readmission Risk Interventions No flowsheet data found.

## 2021-06-16 NOTE — Plan of Care (Signed)

## 2021-06-16 NOTE — Progress Notes (Cosign Needed)
    Durable Medical Equipment  (From admission, onward)           Start     Ordered   06/16/21 1426  For home use only DME 4 wheeled rolling walker with seat  Once       Question:  Patient needs a walker to treat with the following condition  Answer:  Generalized weakness   06/16/21 1425

## 2021-06-16 NOTE — Progress Notes (Signed)
Mobility Specialist Progress Note    06/16/21 1431  Mobility  Activity Ambulated in hall  Level of Assistance Standby assist, set-up cues, supervision of patient - no hands on  Assistive Device Four wheel walker  Distance Ambulated (ft) 215 ft  Mobility Ambulated with assistance in hallway  Mobility Response Tolerated well  Mobility performed by Mobility specialist  Bed Position Chair  $Mobility charge 1 Mobility   Pt received in chair and agreeable. No complaints on walk. Returned to chair with son present in room.   Hildred Alamin Mobility Specialist  Mobility Specialist Phone: [phone]

## 2021-06-16 NOTE — Progress Notes (Signed)
Occupational Therapy Treatment Patient Details Name: Shannon Jensen MRN: [id number] DOB: 29-Mar-1928 Today's Date: 06/16/2021   History of present illness Pt is a 87 year old woman admitted on 06/12/21 with SOB and L sided chest pain. + saddle pulmonary embolus and associated SVT. PMH: breast ca, HTN, arthritis, spinal stenosis, anxiety and depression.   OT comments  Patient received in bed and was able to donn socks while long sitting.  Patient used cane for mobility with assistance for balance. Patient performed grooming and UB bathing seated. Patient performed transfer to [facility] with min assist and was able to perform hygiene seated and completed while standing.  Patient stood at sink for perineal cleaning and bathed lower legs seated with assistance to doff and donn socks when up in chair. Patient making good progress with OT treatment.    Recommendations for follow up therapy are one component of a multi-disciplinary discharge planning process, led by the attending physician.  Recommendations may be updated based on patient status, additional functional criteria and insurance authorization.    Follow Up Recommendations  Home health OT;Supervision/Assistance - 24 hour    Equipment Recommendations  None recommended by OT    Recommendations for Other Services      Precautions / Restrictions Precautions Precautions: Fall Restrictions Weight Bearing Restrictions: No       Mobility Bed Mobility Overal bed mobility: Needs Assistance Bed Mobility: Supine to Sit     Supine to sit: Min guard;HOB elevated     General bed mobility comments: min guard and extra time to get to eob    Transfers Overall transfer level: Needs assistance Equipment used: 1 person hand held assist;Straight cane Transfers: Sit to/from Stand Sit to Stand: Min assist         General transfer comment: unsteady with transfers    Balance Overall balance assessment: Needs assistance Sitting-balance  support: No upper extremity supported;Feet supported Sitting balance-Leahy Scale: Fair     Standing balance support: Bilateral upper extremity supported;During functional activity Standing balance-Leahy Scale: Poor Standing balance comment: Reliant on B UE support                           ADL either performed or assessed with clinical judgement   ADL Overall ADL's : Needs assistance/impaired     Grooming: Wash/dry hands;Wash/dry face;Oral care;Sitting;Set up Grooming Details (indicate cue type and reason): performed sitting, declined to perform standing Upper Body Bathing: Set up;Sitting   Lower Body Bathing: Minimal assistance;Sit to/from stand Lower Body Bathing Details (indicate cue type and reason): required assistance with feet Upper Body Dressing : Set up;Sitting Upper Body Dressing Details (indicate cue type and reason): donned gown Lower Body Dressing: Minimal assistance;Sit to/from stand Lower Body Dressing Details (indicate cue type and reason): able to donn socks sitting up in bed but require assistance to change socks when up in chair. Toilet Transfer: Minimal assistance;Ambulation Toilet Transfer Details (indicate cue type and reason): used cane for transfers Toileting- Water quality scientist and Hygiene: Min guard;Sit to/from stand Toileting - Clothing Manipulation Details (indicate cue type and reason): performed while standing with min guard for safety     Functional mobility during ADLs: Minimal assistance;Cane General ADL Comments: used cane for mobility. Performed most self care seated due to balance and low activity tolerance     Vision       Perception     Praxis      Cognition Arousal/Alertness: Awake/alert Behavior During Therapy: [facility]  for tasks assessed/performed Overall Cognitive Status: Within Functional Limits for tasks assessed                                 General Comments: able to follow commands, good safety         Exercises     Shoulder Instructions       General Comments      Pertinent Vitals/ Pain       Pain Assessment: No/denies pain  Home Living                                          Prior Functioning/Environment              Frequency  Min 2X/week        Progress Toward Goals  OT Goals(current goals can now be found in the care plan section)  Progress towards OT goals: Progressing toward goals  Acute Rehab OT Goals Patient Stated Goal: to feel better and go home OT Goal Formulation: With patient Time For Goal Achievement: 06/29/21 Potential to Achieve Goals: Good ADL Goals Pt Will Perform Grooming: with min guard assist;standing Pt Will Perform Lower Body Bathing: with min guard assist;sit to/from stand Pt Will Perform Lower Body Dressing: with min guard assist;sit to/from stand Pt Will Transfer to Toilet: with min guard assist;ambulating;bedside commode Pt Will Perform Toileting - Clothing Manipulation and hygiene: with min guard assist;sit to/from stand  Plan Discharge plan remains appropriate    Co-evaluation                 AM-PAC OT "6 Clicks" Daily Activity     Outcome Measure   Help from another person eating meals?: None Help from another person taking care of personal grooming?: A Little Help from another person toileting, which includes using toliet, bedpan, or urinal?: A Little Help from another person bathing (including washing, rinsing, drying)?: A Little Help from another person to put on and taking off regular upper body clothing?: A Little Help from another person to put on and taking off regular lower body clothing?: A Little 6 Click Score: 19    End of Session Equipment Utilized During Treatment: Gait belt  OT Visit Diagnosis: Unsteadiness on feet (R26.81);Other abnormalities of gait and mobility (R26.89);Muscle weakness (generalized) (M62.81);Pain   Activity Tolerance Patient tolerated treatment well    Patient Left in chair;with call bell/phone within reach;with family/visitor present   Nurse Communication Mobility status        Time: 1011-1059 OT Time Calculation (min): 48 min  Charges: OT General Charges $OT Visit: 1 Visit OT Treatments $Self Care/Home Management : 38-52 mins  Lodema Hong, OTA   Lister Brizzi Alexis Goodell 06/16/2021, 11:09 AM

## 2021-06-16 NOTE — Progress Notes (Signed)
Physical Therapy Treatment Patient Details Name: Shannon Jensen MRN: [id number] DOB: 12-Nov-1927 Today's Date: 06/16/2021   History of Present Illness Pt is a 87 year old woman admitted on 06/12/21 with SOB and L sided chest pain. + saddle pulmonary embolus and associated SVT. PMH: breast ca, HTN, arthritis, spinal stenosis, anxiety and depression.    PT Comments    Pt able to make good progress with mobility, ambulating up to ~100 ft with min guard assist using a rollator before fatiguing this date. Pt educated on safe use of rollator as she is requesting an upright rollator. Pt needs repeated cues to remain proximal to her AD for safety purposes when ambulating. Pt still at risk for falls, as is evident by her significantly slow gait pace. Will continue to follow acutely. Updated d/c recs to OP PT per pt request.   Recommendations for follow up therapy are one component of a multi-disciplinary discharge planning process, led by the attending physician.  Recommendations may be updated based on patient status, additional functional criteria and insurance authorization.  Follow Up Recommendations  Outpatient PT;Supervision for mobility/OOB     Equipment Recommendations  Rolling walker with 5" wheels (pt requesting standing upright rollator instead)    Recommendations for Other Services       Precautions / Restrictions Precautions Precautions: Fall Restrictions Weight Bearing Restrictions: No     Mobility  Bed Mobility Overal bed mobility: Needs Assistance Bed Mobility: Supine to Sit     Supine to sit: Min guard;HOB elevated     General bed mobility comments: Pt sitting up in chair upon arrival.    Transfers Overall transfer level: Needs assistance Equipment used: 4-wheeled walker Transfers: Sit to/from Stand Sit to Stand: Min guard         General transfer comment: Sit to stand with min guard for safety as unsteadiness noted, but no LOB. Educated pt on use of  breaks to ensure safety with transfers.  Ambulation/Gait Ambulation/Gait assistance: Min guard Gait Distance (Feet): 100 Feet Assistive device: 4-wheeled walker Gait Pattern/deviations: Step-through pattern;Decreased stride length;Trunk flexed Gait velocity: Decreased Gait velocity interpretation: <1.31 ft/sec, indicative of household ambulator General Gait Details: Pt with very slow gait and trunk flexed posture. Pt changing head positions while ambulating without LOB, min guard assist for safety. Repeated cues provided to keep rollator proximal to her body.   Stairs             Wheelchair Mobility    Modified Rankin (Stroke Patients Only)       Balance Overall balance assessment: Needs assistance Sitting-balance support: No upper extremity supported;Feet supported Sitting balance-Leahy Scale: Fair     Standing balance support: Bilateral upper extremity supported;During functional activity Standing balance-Leahy Scale: Poor Standing balance comment: Reliant on B UE support                            Cognition Arousal/Alertness: Awake/alert Behavior During Therapy: WFL for tasks assessed/performed Overall Cognitive Status: Within Functional Limits for tasks assessed                                 General Comments: able to follow commands, good safety      Exercises      General Comments General comments (skin integrity, edema, etc.): Educated pt on difference between OT and PT and benefits on following up with therapies to maximize her  functional mobility/ADL safety and independence      Pertinent Vitals/Pain Pain Assessment: Faces Faces Pain Scale: Hurts little more Pain Location: back, legs Pain Descriptors / Indicators: Sore;Discomfort Pain Intervention(s): Monitored during session;Limited activity within patient's tolerance;Repositioned    Home Living                      Prior Function            PT Goals  (current goals can now be found in the care plan section) Acute Rehab PT Goals Patient Stated Goal: to feel better and go home PT Goal Formulation: With patient/family Time For Goal Achievement: 06/29/21 Potential to Achieve Goals: Good Progress towards PT goals: Progressing toward goals    Frequency    Min 3X/week      PT Plan Discharge plan needs to be updated;Equipment recommendations need to be updated    Co-evaluation              AM-PAC PT "6 Clicks" Mobility   Outcome Measure  Help needed turning from your back to your side while in a flat bed without using bedrails?: None Help needed moving from lying on your back to sitting on the side of a flat bed without using bedrails?: A Little Help needed moving to and from a bed to a chair (including a wheelchair)?: A Little Help needed standing up from a chair using your arms (e.g., wheelchair or bedside chair)?: A Little Help needed to walk in hospital room?: A Little Help needed climbing 3-5 steps with a railing? : A Little 6 Click Score: 19    End of Session Equipment Utilized During Treatment: Gait belt Activity Tolerance: Patient tolerated treatment well;Patient limited by fatigue Patient left: Other (comment);with family/visitor present (on commode) Nurse Communication: Mobility status;Other (comment) (pt on commode and instructed to call for nursing when ready) PT Visit Diagnosis: Unsteadiness on feet (R26.81);Muscle weakness (generalized) (M62.81);Other abnormalities of gait and mobility (R26.89);Difficulty in walking, not elsewhere classified (R26.2)     Time: 7408-1448 PT Time Calculation (min) (ACUTE ONLY): 28 min  Charges:  $Gait Training: 23-37 mins                     Moishe Spice, PT, DPT Acute Rehabilitation Services  Pager: [phone] Office: [place] 06/16/2021, 1:50 PM

## 2021-06-16 NOTE — Discharge Summary (Addendum)
Physician Discharge Summary  Shannon Jensen TWS:[id number] DOB: 1928-05-23 DOA: 06/12/2021  PCP: [name], MD  Admit date: 06/12/2021 Discharge date: 06/16/2021  Time spent:  60 minutes  Recommendations for Outpatient Follow-up:  Follow-up PCP in 2 weeks Endocrinology referral as outpatient Check BMP in 2 weeks Home health PT/OT   Discharge Diagnoses:  Active Problems:   Essential hypertension   Malignant neoplasm of upper-inner quadrant of left breast in female, estrogen receptor positive (HCC)   Chest pain at rest   Chest pain SVT Pulmonary embolism Hyponatremia  Discharge Condition: Stable  Diet recommendation: Heart healthy diet  Filed Weights   06/14/21 0224 06/15/21 0503 06/16/21 0518  Weight: 66 kg 66.2 kg 65.9 kg    History of present illness:  87 year old female with remote history of breast cancer on tamoxifen, hypertension, anxiety, depression presented to ED with complaints of left lateral wall chest pain, dyspnea exertion and tachycardia.  In the ED she was found to have elevated D-dimer, mildly elevated high-sensitivity troponin.  CTA chest was noted to be submassive PE.    Hospital Course:   Submassive pulmonary embolism -Concern for right heart strain on CT chest -Echocardiogram showed no right heart strain -IV heparin was switched to Xarelto -Will need to be on Xarelto for 6 months   SVT -Resolved after starting on metoprolol -Cardiology has signed off   Pericardial cyst -2 cm pericardial cyst seen on echocardiogram -Patient is asymptomatic; no intervention needed as per cardiology   Thyroid nodule -Will need endocrinology referral as outpatient   Hypertension -Blood pressure is controlled -Continue metoprolol -Discontinue Azor   History of breast cancer -Continue tamoxifen  Hyponatremia -Improved, sodium today is 131 -Check BMP in 2 weeks    Procedures: Echocardiogram  Consultations:   Discharge Exam: Vitals:    06/15/21 2107 06/16/21 0518  BP: (!) 148/69 (!) 141/62  Pulse: 88 79  Resp: 20 18  Temp: 98.7 F (37.1 C) 98.3 F (36.8 C)  SpO2: 100% 98%    General: Appears in no acute distress Cardiovascular: S1-S2, regular Respiratory: Clear to auscultation bilaterally  Discharge Instructions   Discharge Instructions     Diet - low sodium heart healthy   Complete by: As directed    Discharge instructions   Complete by: As directed    Make sure that you get refills for Xarelto from your PCP after you complete the Xarelto starter pack.  You need to be on this medication for 6 months.   Increase activity slowly   Complete by: As directed       Allergies as of 06/16/2021       Reactions   Celecoxib Other (See Comments)   Unknown   Erythromycin Other (See Comments)   Unknown   Nabumetone Other (See Comments)   Unknown   Nsaids Other (See Comments)   Unknown Unknown   Oxycodone-acetaminophen Other (See Comments)   Tyloxapol    Vioxx [rofecoxib] Other (See Comments)   Unknown        Medication List     STOP taking these medications    amLODipine-olmesartan 5-40 MG tablet Commonly known as: Azor       TAKE these medications    acetaminophen 325 MG tablet Commonly known as: TYLENOL Take 650 mg by mouth every 6 (six) hours as needed for moderate pain or headache.   diclofenac Sodium 1 % Gel Commonly known as: VOLTAREN Apply 2 g topically daily as needed.   Fish Oil 1200 MG Caps  Take 1,200 mg by mouth daily.   metoprolol tartrate 25 MG tablet Commonly known as: LOPRESSOR Take 1 tablet (25 mg total) by mouth 2 (two) times daily.   Rivaroxaban Stater Pack (15 mg and 20 mg) Commonly known as: XARELTO STARTER PACK Follow package directions: Take one 15mg  tablet by mouth twice a day. On day 22, switch to one 20mg  tablet once a day. Take with food.   tamoxifen 10 MG tablet Commonly known as: NOLVADEX Take 1 tablet (10 mg total) by mouth daily.   Vitamin D 50 MCG  (2000 UT) Caps Take 2,000 Units by mouth daily.   Womens One Daily Tabs Take 1 tablet by mouth daily.       Allergies  Allergen Reactions   Celecoxib Other (See Comments)    Unknown   Erythromycin Other (See Comments)    Unknown   Nabumetone Other (See Comments)    Unknown   Nsaids Other (See Comments)    Unknown Unknown    Oxycodone-Acetaminophen Other (See Comments)   Tyloxapol    Vioxx [Rofecoxib] Other (See Comments)    Unknown      The results of significant diagnostics from this hospitalization (including imaging, microbiology, ancillary and laboratory) are listed below for reference.    Significant Diagnostic Studies: DG Chest 2 View  Result Date: 06/12/2021 CLINICAL DATA:  Shortness of breath. EXAM: CHEST - 2 VIEW COMPARISON:  None. FINDINGS: The lungs are hyperinflated. Mild, diffuse, chronic appearing increased interstitial lung markings are seen. There is no evidence of a pleural effusion or pneumothorax. A 2.4 cm x 1.8 cm thin walled calcific lesion is seen overlying the lateral aspect of the right lung base. This appears to be within the soft tissues of the right breast on the lateral view. The heart size and mediastinal contours are within normal limits. There is mild to moderate severity levoscoliosis of the lower thoracic spine with multilevel degenerative changes seen within the thoracic spine and bilateral shoulders. IMPRESSION: Chronic changes, as described above, without evidence of acute or active cardiopulmonary disease. Electronically Signed   By: Virgina Norfolk M.D.   On: 06/12/2021 22:41   CT Angio Chest Pulmonary Embolism (PE) W or WO Contrast  Result Date: 06/13/2021 CLINICAL DATA:  Short of breath. Clinical suspicion for pulmonary emboli. EXAM: CT ANGIOGRAPHY CHEST WITH CONTRAST TECHNIQUE: Multidetector CT imaging of the chest was performed using the standard protocol during bolus administration of intravenous contrast. Multiplanar CT image  reconstructions and MIPs were obtained to evaluate the vascular anatomy. CONTRAST:  87mL OMNIPAQUE IOHEXOL 350 MG/ML SOLN COMPARISON:  CT, 12/06/2016.  Chest radiograph from 06/12/2021. FINDINGS: Cardiovascular: Pulmonary arteries are well opacified. There are bilateral pulmonary emboli. A saddle embolus straddles the main pulmonary artery extending into the right and left pulmonary arteries. Nonocclusive pulmonary embolus noted in the apical segmental branch of the right upper lobe. Another small pulmonary embolus lies in the right middle lobe, medial segment. Pulmonary emboli extend into the origins of the left upper lobe anterior and apical segmental branches, as well as the lingular segment branches. Nonocclusive pulmonary emboli at the origin of the lower lobe segmental branches. Heart is normal in size and configuration. RV LV ratio is 1.28. No pericardial effusion. No coronary artery calcifications. Aorta is normal in caliber. Aortic atherosclerosis. Mediastinum/Nodes: Thyroid mildly enlarged and heterogeneous suggesting small nodules. No mediastinal or hilar masses or enlarged lymph nodes. Trachea and esophagus are unremarkable. Lungs/Pleura: Predominantly linear type opacities noted in the dependent lower lobes consistent  with atelectasis. Lungs otherwise clear. No pleural effusion. No pneumothorax. Upper Abdomen: No acute abnormality. Musculoskeletal: No acute fracture. No bone lesion. Arthropathic changes of both glenohumeral joints, advanced and more severe on the right. Chronic seroma in the right breast, stable. Review of the MIP images confirms the above findings. IMPRESSION: 1. Bilateral pulmonary emboli as detailed. Positive for acute PE with CT evidence of right heart strain (RV/LV Ratio = 1.28) consistent with at least submassive (intermediate risk) PE. The presence of right heart strain has been associated with an increased risk of morbidity and mortality. Please refer to the "PE Focused" order  set in EPIC. 2. No other acute abnormality within the chest. 3. Aortic atherosclerosis. 4. Thyroid prominent with evidence of small nodules. Patient has had previous thyroid ultrasound and FNA biopsy, last ultrasound dated 07/09/2019. Aortic Atherosclerosis (ICD10-I70.0). Electronically Signed   By: Lajean Manes M.D.   On: 06/13/2021 15:35   ECHOCARDIOGRAM COMPLETE  Result Date: 06/14/2021    ECHOCARDIOGRAM REPORT   Patient Name:   Shannon Jensen Date of Exam: 06/14/2021 Medical Rec #:  [id number]        Height:       63.0 in Accession #:    [id number]       Weight:       145.5 lb Date of Birth:  1927/12/13       BSA:          1.689 m Patient Age:    32 years         BP:           136/68 mmHg Patient Gender: F                HR:           92 bpm. Exam Location:  Inpatient Procedure: 2D Echo, Cardiac Doppler and Color Doppler Indications:    Dyspnea  History:        Patient has no prior history of Echocardiogram examinations.                 Pulmonary embolus, Signs/Symptoms:Dyspnea; Risk                 Factors:Hypertension and Dyslipidemia. Breast cancer.  Sonographer:    Dustin Flock RDCS Referring Phys: [place]  1. Left ventricular ejection fraction, by estimation, is 65 to 70%. The left ventricle has normal function. The left ventricle has no regional wall motion abnormalities. There is mild left ventricular hypertrophy. Left ventricular diastolic parameters are consistent with Grade I diastolic dysfunction (impaired relaxation).  2. Right ventricular systolic function is normal. The right ventricular size is normal. There is mildly elevated pulmonary artery systolic pressure.  3. Round, echolucent structure adherent to the RV free wall measuring about 2 cm in diameter, this likely represents a pericardial cyst or less likely a loculated pericardial effusion, however could consider cMRI to further evalute if clinically warranted.  4. The mitral valve is grossly normal. Trivial  mitral valve regurgitation.  5. The aortic valve is tricuspid. Aortic valve regurgitation is not visualized.  6. The inferior vena cava is normal in size with greater than 50% respiratory variability, suggesting right atrial pressure of 3 mmHg. Comparison(s): No prior Echocardiogram. FINDINGS  Left Ventricle: Left ventricular ejection fraction, by estimation, is 65 to 70%. The left ventricle has normal function. The left ventricle has no regional wall motion abnormalities. The left ventricular internal cavity size was normal in size. There is  mild left ventricular hypertrophy. Left ventricular diastolic parameters are consistent with Grade I diastolic dysfunction (impaired relaxation). Indeterminate filling pressures. Right Ventricle: The right ventricular size is normal. No increase in right ventricular wall thickness. Right ventricular systolic function is normal. There is mildly elevated pulmonary artery systolic pressure. The tricuspid regurgitant velocity is 3.19  m/s, and with an assumed right atrial pressure of 3 mmHg, the estimated right ventricular systolic pressure is 46.9 mmHg. Left Atrium: Left atrial size was normal in size. Right Atrium: Right atrial size was normal in size. Pericardium: Round, echolucent structure adherent to the RV free wall measuring about 2 cm in diameter, this likely represents a pericardial cyst or less likely a loculated pericardial effusion, however could consider cMRI to further evalute if clinically warranted. There is no evidence of pericardial effusion. Mitral Valve: The mitral valve is grossly normal. Trivial mitral valve regurgitation. Tricuspid Valve: The tricuspid valve is grossly normal. Tricuspid valve regurgitation is mild. Aortic Valve: The aortic valve is tricuspid. Aortic valve regurgitation is not visualized. Pulmonic Valve: The pulmonic valve was normal in structure. Pulmonic valve regurgitation is not visualized. Aorta: The aortic root and ascending aorta are  structurally normal, with no evidence of dilitation. Venous: The inferior vena cava is normal in size with greater than 50% respiratory variability, suggesting right atrial pressure of 3 mmHg. IAS/Shunts: No atrial level shunt detected by color flow Doppler.  LEFT VENTRICLE PLAX 2D LVIDd:         3.50 cm     Diastology LVIDs:         1.70 cm     LV e' medial:    6.85 cm/s LV PW:         1.00 cm     LV E/e' medial:  10.8 LV IVS:        1.10 cm     LV e' lateral:   7.29 cm/s LVOT diam:     2.00 cm     LV E/e' lateral: 10.1 LV SV:         64 LV SV Index:   38 LVOT Area:     3.14 cm  LV Volumes (MOD) LV vol d, MOD A4C: 53.5 ml LV vol s, MOD A4C: 17.2 ml LV SV MOD A4C:     53.5 ml RIGHT VENTRICLE RV Basal diam:  2.90 cm RV S prime:     10.40 cm/s TAPSE (M-mode): 1.2 cm LEFT ATRIUM             Index       RIGHT ATRIUM           Index LA diam:        2.50 cm 1.48 cm/m  RA Area:     10.50 cm LA Vol (A2C):   35.8 ml 21.19 ml/m RA Volume:   21.30 ml  12.61 ml/m LA Vol (A4C):   29.1 ml 17.23 ml/m LA Biplane Vol: 32.8 ml 19.42 ml/m  AORTIC VALVE LVOT Vmax:   93.30 cm/s LVOT Vmean:  67.400 cm/s LVOT VTI:    0.204 m  AORTA Ao Root diam: 2.50 cm MITRAL VALVE               TRICUSPID VALVE MV Area (PHT): 5.02 cm    TR Peak grad:   40.7 mmHg MV Decel Time: 151 msec    TR Vmax:        319.00 cm/s MV E velocity: 73.70 cm/s MV A velocity: 87.40 cm/s  SHUNTS  MV E/A ratio:  0.84        Systemic VTI:  0.20 m                            Systemic Diam: 2.00 cm Lyman Bishop MD Electronically signed by Lyman Bishop MD Signature Date/Time: 06/14/2021/11:45:04 AM    Final     Microbiology: Recent Results (from the past 240 hour(s))  Resp Panel by RT-PCR (Flu A&B, Covid) Nasopharyngeal Swab     Status: None   Collection Time: 06/14/21  1:29 AM   Specimen: Nasopharyngeal Swab; Nasopharyngeal(NP) swabs in vial transport medium  Result Value Ref Range Status   SARS Coronavirus 2 by RT PCR NEGATIVE NEGATIVE Final    Comment:  (NOTE) SARS-CoV-2 target nucleic acids are NOT DETECTED.  The SARS-CoV-2 RNA is generally detectable in upper respiratory specimens during the acute phase of infection. The lowest concentration of SARS-CoV-2 viral copies this assay can detect is 138 copies/mL. A negative result does not preclude SARS-Cov-2 infection and should not be used as the sole basis for treatment or other patient management decisions. A negative result may occur with  improper specimen collection/handling, submission of specimen other than nasopharyngeal swab, presence of viral mutation(s) within the areas targeted by this assay, and inadequate number of viral copies(<138 copies/mL). A negative result must be combined with clinical observations, patient history, and epidemiological information. The expected result is Negative.  Fact Sheet for Patients:  EntrepreneurPulse.com.au  Fact Sheet for Healthcare Providers:  IncredibleEmployment.be  This test is no t yet approved or cleared by the [place] FDA and  has been authorized for detection and/or diagnosis of SARS-CoV-2 by FDA under an Emergency Use Authorization (EUA). This EUA will remain  in effect (meaning this test can be used) for the duration of the COVID-19 declaration under Section 564(b)(1) of the Act, 21 U.S.C.section 360bbb-3(b)(1), unless the authorization is terminated  or revoked sooner.       Influenza A by PCR NEGATIVE NEGATIVE Final   Influenza B by PCR NEGATIVE NEGATIVE Final    Comment: (NOTE) The Xpert Xpress SARS-CoV-2/FLU/RSV plus assay is intended as an aid in the diagnosis of influenza from Nasopharyngeal swab specimens and should not be used as a sole basis for treatment. Nasal washings and aspirates are unacceptable for Xpert Xpress SARS-CoV-2/FLU/RSV testing.  Fact Sheet for Patients: EntrepreneurPulse.com.au  Fact Sheet for Healthcare  Providers: IncredibleEmployment.be  This test is not yet approved or cleared by the [place] FDA and has been authorized for detection and/or diagnosis of SARS-CoV-2 by FDA under an Emergency Use Authorization (EUA). This EUA will remain in effect (meaning this test can be used) for the duration of the COVID-19 declaration under Section 564(b)(1) of the Act, 21 U.S.C. section 360bbb-3(b)(1), unless the authorization is terminated or revoked.  Performed at [facility], [place] [address] [place], [place] [address]      Labs: Basic Metabolic Panel: Recent Labs  Lab 06/12/21 1342 06/12/21 2204 06/14/21 0337 06/15/21 0246  NA 136 134* 132* 131*  K 4.6 4.2 4.5 4.4  CL 98 102 104 102  CO2 19* 19* 20* 20*  GLUCOSE 89 102* 97 101*  BUN 19 23 23 14   CREATININE 1.20* 1.31* 1.25* 1.00  CALCIUM 9.4 9.2 8.8* 8.6*   Liver Function Tests: Recent Labs  Lab 06/12/21 1342 06/14/21 0337  AST 25 22  ALT 16 16  ALKPHOS 41* 33*  BILITOT 0.6  0.7  PROT 7.0 5.9*  ALBUMIN 3.9 2.9*   No results for input(s): LIPASE, AMYLASE in the last 168 hours. No results for input(s): AMMONIA in the last 168 hours. CBC: Recent Labs  Lab 06/12/21 1342 06/12/21 2204 06/14/21 0337 06/15/21 0246 06/16/21 0322  WBC 9.5 8.9 7.6 8.2 7.0  NEUTROABS 5.5  --   --   --   --   HGB 12.3 12.1 11.2* 10.7* 10.3*  HCT 37.3 35.0* 32.7* 31.8* 30.6*  MCV 93 91.6 90.3 91.4 90.8  PLT 173 157 150 159 171   Cardiac Enzymes: No results for input(s): CKTOTAL, CKMB, CKMBINDEX, TROPONINI in the last 168 hours. BNP: BNP (last 3 results) No results for input(s): BNP in the last 8760 hours.  ProBNP (last 3 results) No results for input(s): PROBNP in the last 8760 hours.  CBG: No results for input(s): GLUCAP in the last 168 hours.     Signed:  Oswald Hillock MD.  [facility] 06/16/2021, 10:01 AM

## 2021-06-16 NOTE — Care Management (Addendum)
Patient requesting tub bench/shower chair. Ordered will call adapt for ship to home on 10/8 patient ordered home health by MD, but has already been scheduled and is recommended for OP PT. MD messaged.  Patient called  regarding tub bench being shipped, address on record verified.

## 2021-06-16 NOTE — Plan of Care (Signed)

## 2021-06-19 ENCOUNTER — Telehealth: Payer: Self-pay

## 2021-06-19 NOTE — Telephone Encounter (Signed)
Transition Care Management Follow-up Telephone Call Date of discharge and from where: Shannon Jensen 06/16/21 How have you been since you were released from the hospital? No Any questions or concerns? No  Items Reviewed: Did the pt receive and understand the discharge instructions provided? Yes  Medications obtained and verified? Yes  Other? No  Any new allergies since your discharge? No  Dietary orders reviewed? No Do you have support at home? Yes   Home Care and Equipment/Supplies: Were home health services ordered? no   Functional Questionnaire: (I = Independent and D = Dependent) ADLs: I  Bathing/Dressing- I  Meal Prep- I  Eating- I  Maintaining continence- I  Transferring/Ambulation- I  Managing Meds- I  Follow up appointments reviewed:  PCP Hospital f/u appt confirmed? Yes  Scheduled to see Dr. Redmond School on 06/23/21 @ 11:30. [facility] f/u appt confirmed? No   Are transportation arrangements needed? No  If their condition worsens, is the pt aware to call PCP or go to the Emergency Dept.? Yes Was the patient provided with contact information for the PCP's office or ED? Yes Was to pt encouraged to call back with questions or concerns? Yes

## 2021-06-23 ENCOUNTER — Encounter: Payer: Self-pay | Admitting: Family Medicine

## 2021-06-23 ENCOUNTER — Other Ambulatory Visit: Payer: Self-pay

## 2021-06-23 ENCOUNTER — Ambulatory Visit: Payer: Medicare PPO | Admitting: Family Medicine

## 2021-06-23 VITALS — BP 122/68 | HR 67 | Temp 98.3°F | Wt 144.2 lb

## 2021-06-23 DIAGNOSIS — I700 Atherosclerosis of aorta: Secondary | ICD-10-CM | POA: Diagnosis not present

## 2021-06-23 DIAGNOSIS — Z23 Encounter for immunization: Secondary | ICD-10-CM | POA: Diagnosis not present

## 2021-06-23 DIAGNOSIS — I2692 Saddle embolus of pulmonary artery without acute cor pulmonale: Secondary | ICD-10-CM

## 2021-06-23 MED ORDER — ATORVASTATIN CALCIUM 10 MG PO TABS: 10.0000 mg | ORAL_TABLET | Freq: Every day | ORAL | 3 refills | Status: DC

## 2021-06-23 MED ORDER — RIVAROXABAN 20 MG PO TABS: 20.0000 mg | ORAL_TABLET | Freq: Every day | ORAL | 1 refills | Status: DC

## 2021-06-23 NOTE — Progress Notes (Signed)
   Subjective:    Patient ID: Shannon Jensen, female    DOB: [birth year], 87 y.o.   MRN: [id number]  HPI She is here for further evaluation and treatment of recent hospitalization for pulmonary embolus.  She was found to have a saddle embolus placed on Lovenox and then switched to Xarelto.  Initially she was going to be in a rehab unit but mistook the rehab for a nursing home and did not want that.  She is now at home and her son and daughter have to take care of her which is a burden on them since neither of them live in the city.  They now would like her placed in a rehab to help with OT and PT to get her ready for going back home.  There was also noted that she had thyroid nodules however review of the record indicates that she has had fine-needle aspiration of this.  Review of the pathology showed no evidence of malignancy. X-rays also showed evidence of atherosclerosis. She was also placed on a beta-blocker to help with her heart rate. Review of Systems     Objective:   Physical Exam Alert and in no distress otherwise not examined.  She was not tachypneic or complaining of pain.       Assessment & Plan:  Acute saddle pulmonary embolism without acute cor pulmonale (HCC) - Plan: rivaroxaban (XARELTO) 20 MG TABS tablet  Aortic atherosclerosis (HCC) - Plan: atorvastatin (LIPITOR) 10 MG tablet She presently is on 20-day starter dose.  I called in a prescription to start after she finishes this dosing regimen for another 6 months.  Do not feel we need to go further with her thyroid function.  We will have her follow-up with me after the rehab situation is squared away.  She and her son and daughter were all comfortable with that. I also discussed treatment of the aortic atherosclerosis with Lipitor.

## 2021-06-27 ENCOUNTER — Other Ambulatory Visit: Payer: Self-pay | Admitting: Hematology and Oncology

## 2021-06-27 DIAGNOSIS — Z9889 Other specified postprocedural states: Secondary | ICD-10-CM

## 2021-06-28 ENCOUNTER — Telehealth: Payer: Self-pay | Admitting: Family Medicine

## 2021-06-28 ENCOUNTER — Other Ambulatory Visit: Payer: Self-pay

## 2021-06-28 ENCOUNTER — Inpatient Hospital Stay: Payer: Medicare PPO | Admitting: Family Medicine

## 2021-06-28 DIAGNOSIS — I2692 Saddle embolus of pulmonary artery without acute cor pulmonale: Secondary | ICD-10-CM

## 2021-06-28 NOTE — Telephone Encounter (Signed)
Pt's daughter called and states that she needs to speak to kim ASAP. She has been trying to make contact since Monday. Please call [phone].

## 2021-06-28 NOTE — Telephone Encounter (Signed)
Put in order . [name]

## 2021-06-28 NOTE — Telephone Encounter (Signed)
Called pt daughter and advised I will look into this further.   Shannon [facility] patient rehab called me back to let me know that it would hard to get her in now due to her being discharge. The insurance company requires consistent updates that they will not be able to provide to get pt approve. We will wait on the Physical medicine referral and if denied pt may have to return to the hospital to get in . [facility]

## 2021-06-29 ENCOUNTER — Encounter: Payer: Self-pay | Admitting: *Deleted

## 2021-06-29 ENCOUNTER — Other Ambulatory Visit: Payer: Self-pay

## 2021-06-29 ENCOUNTER — Other Ambulatory Visit: Payer: Medicare PPO | Admitting: *Deleted

## 2021-06-29 DIAGNOSIS — I2692 Saddle embolus of pulmonary artery without acute cor pulmonale: Secondary | ICD-10-CM

## 2021-06-29 NOTE — Patient Instructions (Signed)
Goals Addressed               This Visit's Progress     Find Help in My Community (pt-stated)        Barriers: Knowledge Timeframe:  Short-Term Goal Priority:  High Start Date:     06/29/21                        Expected End Date:      08/09/21                 Follow Up Date 08/09/21   - follow-up on any referrals for help I am given - think ahead to make sure my need does not become an emergency - have a back-up plan    Why is this important?   Knowing how and where to find help for yourself or family in your neighborhood and community is an important skill.  You will want to take some steps to learn how.    Notes: 06/29/21 Patient with recent hospitalization. Patient was recommended rehab but patient declined as she did not understand. Patient and family now wanting rehab placement. Care Manager did give family names of area rehab centers to follow up on.        [facility] My Quality of Life        Barriers: None Timeframe:  Short-Term Goal Priority:  High Start Date:   06/29/21                          Expected End Date:    08/09/21                   Follow Up Date 08/09/21 - check out options for in-home help, long-term care or hospice    Why is this important?   Having a long-term illness can be scary.  It can also be stressful for you and your caregiver.  These steps may help.    Notes: 06/29/21 Patient recent hospitalization and declined rehab.  Family and patient trying to seek placement now. Patient also declined home health.  Resources given  for long term care facilities local.  Social work referral done.

## 2021-06-29 NOTE — Patient Outreach (Signed)
[name]  [place]  06/29/2021   Shannon Jensen [birth year] [id number]  Subjective: Spoke with daughter Shannon Jensen per referral and son Shannon Jensen who called during call.  Shannon Jensen states that patient recently discharged from hospital with rehab recommendation but patient misunderstood and declined placement for rehab. She states that patient needs assistance 24/7 and family needs help. Patient also declined home health.  She states that the patient is now agreeable to short term placement for rehab. Explained that now that she is discharge this maybe hard but could get social worker involved to assist.  Did give names of local rehabs to explore as not sure of payment status.  As of now daughter Shannon Jensen is providing 24/7 care.    Discussed [facility] services and support further and she is agreeable to services.   Patient with history of HTN and recent pulmonary embolism for which she is now on xarelto.  Main goal at this time is help with placement.   Objective:   Encounter Medications:  Outpatient Encounter Medications as of 06/29/2021  Medication Sig   acetaminophen (TYLENOL) 325 MG tablet Take 650 mg by mouth every 6 (six) hours as needed for moderate pain or headache.    atorvastatin (LIPITOR) 10 MG tablet Take 1 tablet (10 mg total) by mouth daily.   Cholecalciferol (VITAMIN D) 2000 units CAPS Take 2,000 Units by mouth daily.   diclofenac Sodium (VOLTAREN) 1 % GEL Apply 2 g topically daily as needed.   Fluocinolone Acetonide 0.01 % OIL fluocinolone acetonide oil 0.01 % ear drops  INSTILL 4 DROPS INTO BOTH EAR(S) FOR 14 DAYS. THEN STOP AND USE AS NEEDED FOR ITCHING.   metoprolol tartrate (LOPRESSOR) 25 MG tablet Take 1 tablet (25 mg total) by mouth 2 (two) times daily.   Multiple Vitamins-Minerals (WOMENS ONE DAILY) TABS Take 1 tablet by mouth daily.     Omega-3 Fatty Acids (FISH OIL) 1200 MG CAPS Take 1,200 mg by mouth daily.    [START ON  07/14/2021] rivaroxaban (XARELTO) 20 MG TABS tablet Take 1 tablet (20 mg total) by mouth daily with supper.   RIVAROXABAN (XARELTO) VTE STARTER PACK (15 & 20 MG) Follow package directions: Take one 15mg  tablet by mouth twice a day. On day 22, switch to one 20mg  tablet once a day. Take with food.   tamoxifen (NOLVADEX) 10 MG tablet Take 1 tablet (10 mg total) by mouth daily.   No facility-administered encounter medications on file as of 06/29/2021.    Functional Status:  In your present state of health, do you have any difficulty performing the following activities: 06/29/2021 06/14/2021  Hearing? Y Y  Comment HOH-working to get heearing aids -  Vision? N N  Difficulty concentrating or making decisions? N N  Walking or climbing stairs? Y Y  Comment some shortness of breath -  Dressing or bathing? [name]  Comment daughter helps -  Doing errands, shopping? [name]  Comment daughter helps -  Conservation officer, nature and eating ? Y -  Comment daughter helps -  Using the Toilet? N -  In the past six months, have you accidently leaked urine? Y -  Comment depends -  Do you have problems with loss of bowel control? N -  Managing your Medications? Y -  Comment daughter helps -  Managing your Finances? Y -  Comment daughter helps -  Housekeeping or managing your Housekeeping? Y -  Comment daughter helps -  Some recent data might  be hidden    Fall/Depression Screening: Fall Risk  06/29/2021 07/19/2020 07/09/2019  Falls in the past year? 0 0 0   PHQ 2/9 Scores 07/19/2020 07/09/2019 01/14/2018 09/30/2015 08/09/2015 11/19/2013 06/13/2012  PHQ - 2 Score 0 0 0 0 0 0 2    Assessment:   Care Plan Care Plan : General Plan of Care (Adult)  Updates made by [name], RN since 06/29/2021 12:00 AM     Problem: Health Promotion or Disease Self-Management (General Plan of Care)      Goal: Self-Management Plan Developed as evidenced by family follow ing up on any resources given.   Start Date: 06/29/2021   Expected End Date: 08/09/2021  Priority: High  Note:   Evidence-based guidance:  Review biopsychosocial determinants of health screens.  Determine level of modifiable health risk.  Assess level of patient activation, level of readiness, importance and confidence to make changes.  Evoke change talk using open-ended questions, pros and cons, as well as looking forward.  Identify areas where behavior change may lead to improved health.  Partner with patient to develop a robust self-management plan that includes lifestyle factors, such as weight loss, exercise and healthy nutrition, as well as goals specific to disease risks.  Support patient and family/caregiver active participation in decision-making and self-management plan.  Implement additional goals and interventions based on identified risk factors to reduce health risk.  Facilitate advance care planning.  Review need for preventive screening based on age, sex, family history and health history.   Notes:     Task: Mutually Develop and [name] Achievement of Patient Goals   Note:   Care Management Activities:    - collaboration with team encouraged - questions answered - reassurance provided - resources needed to meet goals identified    Notes: 06/29/21 Patient with recent hospitalization. Patient recommended for rehab but patient declined due to explanation given.  Family and patient agreeable to rehab at this time and need some assistance with placement.  CM did give information of local rehabs to check out.      Problem: Therapeutic Alliance (General Plan of Care)      Goal: Therapeutic Alliance Established as evidenced by family engaging with care manager   Start Date: 06/29/2021  Expected End Date: 08/09/2021  Priority: High  Note:   Evidence-based guidance:  Avoid value judgments; convey acceptance.  Encourage collaboration with the treatment team.  Establish rapport; develop trust relationship.  Systems analyst.  Provide emotional support; encourage patient to share feelings of anger, fear and anxiety.  Promote self-reliance and autonomy based on age and ability; discourage overprotection.  Use empathy and nonjudgmental, participatory manner.   Notes:     Task: Develop Relationship to Effect Behavior Change   Note:   Care Management Activities:    - acceptance conveyed - care explained - collaboration with team encouraged - questions answered - reassurance provided    Notes: 06/29/21 Family agreeable to care management assistance for help with placement. Patient recently hospitalized and needs assistance with care 24/7.        Goals Addressed               This Visit's Progress     Find Help in My Community (pt-stated)        Barriers: Knowledge Timeframe:  Short-Term Goal Priority:  High Start Date:     06/29/21  Expected End Date:      08/09/21                 Follow Up Date 08/09/21   - follow-up on any referrals for help I am given - think ahead to make sure my need does not become an emergency - have a back-up plan    Why is this important?   Knowing how and where to find help for yourself or family in your neighborhood and community is an important skill.  You will want to take some steps to learn how.    Notes: 06/29/21 Patient with recent hospitalization. Patient was recommended rehab but patient declined as she did not understand. Patient and family now wanting rehab placement. Care Manager did give family names of area rehab centers to follow up on.        [place] My Quality of Life        Barriers: None Timeframe:  Short-Term Goal Priority:  High Start Date:   06/29/21                          Expected End Date:    08/09/21                   Follow Up Date 08/09/21 - check out options for in-home help, long-term care or hospice    Why is this important?   Having a long-term illness can be scary.  It can also be  stressful for you and your caregiver.  These steps may help.    Notes: 06/29/21 Patient recent hospitalization and declined rehab.  Family and patient trying to seek placement now. Patient also declined home health.  Resources given  for long term care facilities local.  Social work referral done.          Plan: RN CM will provide ongoing education and support to patient through phone calls.   RN CM will send welcome packet with consent to patient.   RN CM will send initial barriers letter, assessment, and care plan to primary care physician.   Follow-up: Patient agrees to Care Plan and Follow-up. Follow-up in 1-2 week(s)  Jone Baseman, RN, MSN [facility] Management Care Management Coordinator Direct Line [phone] Cell [phone] Toll Free: [phone]  Fax: [phone]

## 2021-06-29 NOTE — Patient Outreach (Signed)
Chronic Care Management    Clinical Social Work Note  06/29/2021 Name: EMOREE SASAKI MRN: [id number] DOB: 22-Jan-1928  Shannon Jensen is a 87 y.o. year old female who is a primary care patient of [employer], Elyse Jarvis, MD. The CCM team was consulted to assist the patient with chronic disease management and/or care coordination needs related to: Level of Care Concerns and Caregiver Stress.   Engaged with patient's daughter by telephone for initial visit in response to provider referral for social work chronic care management and care coordination services.   Consent to Services:  The patient was given information about Chronic Care Management services, agreed to services, and gave verbal consent prior to initiation of services.  Please see initial visit note for detailed documentation.   Patient agreed to services and consent obtained.   Assessment: Review of patient past medical history, allergies, medications, and health status, including review of relevant consultants reports was performed today as part of a comprehensive evaluation and provision of chronic care management and care coordination services.     SDOH (Social Determinants of Health) assessments and interventions performed:  SDOH Interventions    Flowsheet Row Most Recent Value  SDOH Interventions   Food Insecurity Interventions Intervention Not Indicated, Other (Comment)  [Verified by Daugher [name] [place]]  Financial Strain Interventions Intervention Not Indicated, Other (Comment)  [Verified by [place] Interventions Intervention Not Indicated, Other (Comment)  [Verified by Daugher [name]]  Intimate Partner Violence Interventions Intervention Not Indicated, Other (Comment)  [Verified by Daugher [name]]  Physical Activity Interventions Intervention Not Indicated, Other (Comments)  [Verified by Daugher [name]]  Stress Interventions Intervention Not Indicated, Other  (Comment)  [Verified by Daugher [name]]  Social Connections Interventions Intervention Not Indicated, Other (Comment)  [Verified by Daugher [name]]  Transportation Interventions Intervention Not Indicated, Other (Comment)  [Verified by Daugher [name]]        Advanced Directives Status: See Care Plan for related entries.  CCM Care Plan  Allergies  Allergen Reactions   Celecoxib Other (See Comments)    Unknown   Erythromycin Other (See Comments)    Unknown   Nabumetone Other (See Comments)    Unknown   Nsaids Other (See Comments)    Unknown Unknown    Oxycodone-Acetaminophen Other (See Comments)   Tyloxapol    Vioxx [Rofecoxib] Other (See Comments)    Unknown    Outpatient Encounter Medications as of 06/29/2021  Medication Sig   acetaminophen (TYLENOL) 325 MG tablet Take 650 mg by mouth every 6 (six) hours as needed for moderate pain or headache.    atorvastatin (LIPITOR) 10 MG tablet Take 1 tablet (10 mg total) by mouth daily.   Cholecalciferol (VITAMIN D) 2000 units CAPS Take 2,000 Units by mouth daily.   diclofenac Sodium (VOLTAREN) 1 % GEL Apply 2 g topically daily as needed.   Fluocinolone Acetonide 0.01 % OIL fluocinolone acetonide oil 0.01 % ear drops  INSTILL 4 DROPS INTO BOTH EAR(S) FOR 14 DAYS. THEN STOP AND USE AS NEEDED FOR ITCHING.   metoprolol tartrate (LOPRESSOR) 25 MG tablet Take 1 tablet (25 mg total) by mouth 2 (two) times daily.   Multiple Vitamins-Minerals (WOMENS ONE DAILY) TABS Take 1 tablet by mouth daily.     Omega-3 Fatty Acids (FISH OIL) 1200 MG CAPS Take 1,200 mg by mouth daily.    [START ON 07/14/2021] rivaroxaban (XARELTO) 20 MG TABS tablet Take  1 tablet (20 mg total) by mouth daily with supper.   RIVAROXABAN (XARELTO) VTE STARTER PACK (15 & 20 MG) Follow package directions: Take one 15mg  tablet by mouth twice a day. On day 22, switch to one 20mg  tablet once a day. Take with food.   tamoxifen (NOLVADEX) 10 MG tablet Take 1  tablet (10 mg total) by mouth daily.   No facility-administered encounter medications on file as of 06/29/2021.    Patient Active Problem List   Diagnosis Date Noted   Chest pain at rest 06/13/2021   Chest pain 06/13/2021   SVT (supraventricular tachycardia) (HCC)    Acute saddle pulmonary embolism ([place])    Enlarged thyroid 06/23/2019   Genetic testing 10/03/2018   Family history of breast cancer    Family history of pancreatic cancer    Family history of prostate cancer    Presbycusis of both ears 08/25/2018   Laryngopharyngeal reflux (LPR) 06/10/2018   Invasive ductal carcinoma of left breast ([place]) 10/09/2017   Malignant neoplasm of upper-inner quadrant of left breast in female, estrogen receptor positive ([place]) 09/24/2017   Internal hemorrhoids 09/23/2014   Family history of colon cancer 06/30/2014   History of colonic polyps 06/30/2014   GERD (gastroesophageal reflux disease) 08/27/2012   Diverticulosis    Osteoporosis    Pelvic relaxation    Cystocele    H/O vitamin D deficiency    Hyperlipidemia LDL goal <130 11/08/2008   Essential hypertension 04/13/2008   Osteoarthritis 04/13/2008    Conditions to be addressed/monitored: HTN.  Limited Social Support, Level of Care Concerns, ADL/IADL Limitations, Limited Access to Caregiver, Cognitive Deficits, Memory Deficits, and Lacks Knowledge of [employer].  Care Plan : LCSW Plan of Care  Updates made by Francis Gaines, LCSW since 06/29/2021 12:00 AM     Problem: Receive Skilled Nursing Placement for [employer].   Priority: High     Goal: Receive Skilled Nursing Placement for [employer].   Start Date: 06/29/2021  Expected End Date: 09/29/2021  This Visit's Progress: On track  Priority: High  Note:   Current Barriers:  Chronic disease management support and education needs related to Mobility Issues, Fall Risk, Osteoporosis, Osteoarthritis and  Hypertension. Inability to perform ADL's/IADL's independently, limited mobility, and now requiring 24 hour care and supervision.   Clinical Goal(s):  Patient's daughter will work with LCSW to identify and address any acute and chronic care coordination needs.    LCSW Interventions:  Inter-disciplinary care team collaboration (see longitudinal plan of care). Collaboration with Primary Care Physician, Dr. Jill Alexanders:  Regarding development and update of comprehensive plan of care as evidenced by provider attestation and co-signature. Request order for home health physical therapy and a bath aid, and have nurse arrange through home health agency of choice. Explain that [facility] will not provide prior approval/authorization for rehabilitative services in a skilled nursing facility without documentation and recommendations from physical therapy. Level of care concerns addressed and daughter provided with a complete list of skilled nursing facilities within a 50-mile radius of patient's home. Caregiver Stress acknowledged, Supportive Listening provided, and Solution-Focused Strategies implemented. Patient Goals/Self-Care Activities:   Receive weekly/bi-weekly calls from LCSW, in an effort to coordinate skilled nursing facility placement for short-term rehabilitative services. Review list of skilled nursing facilities provided, and decide on at least 3 facilities of interest. Have daughter continue to live in your home, to provide 24 hour care and supervision, until a higher level of care is initiated. Contact LCSW  directly (# [phone]) if you have questions, need assistance, or if additional social work needs are identified between now and our next scheduled telephone outreach call. Follow-Up Plan:  07/03/2021 at 11:30am     Nat Christen, BSW, MSW, [place]  Licensed Clinical Social Worker  [place]  Mailing [place]. [address]  [place], [place] [address] Physical Address-[address], [place], [place] [address] Toll Free Main # [phone] Fax # [phone] Cell # [phone]  [email or web address]

## 2021-06-29 NOTE — Telephone Encounter (Signed)
Had to put in order to [facility] for further assistants. Pt family was advised that some one will reach out to then concerning the referral. [facility]

## 2021-07-03 ENCOUNTER — Other Ambulatory Visit: Payer: Self-pay | Admitting: *Deleted

## 2021-07-03 NOTE — Patient Outreach (Signed)
Chronic Care Management    Clinical Social Work Note  07/03/2021 Name: Shannon Jensen MRN: [id number] DOB: 03/27/1928  Shannon Jensen is a 87 y.o. year old female who is a primary care patient of [employer], Elyse Jarvis, MD. The CCM team was consulted to assist the patient with chronic disease management and/or care coordination needs related to: [employer], Level of Care Concerns, and Caregiver Stress.   Engaged with patient's daughter by telephone for follow-up visit in response to provider referral for social work chronic care management and care coordination services.   Consent to Services:  The patient was given information about Chronic Care Management services, agreed to services, and gave verbal consent prior to initiation of services.  Please see initial visit note for detailed documentation.   Patient agreed to services and consent obtained.   Assessment: Review of patient past medical history, allergies, medications, and health status, including review of relevant consultants reports was performed today as part of a comprehensive evaluation and provision of chronic care management and care coordination services.     SDOH (Social Determinants of Health) assessments and interventions performed:    Advanced Directives Status: Not addressed in this encounter.  CCM Care Plan  Allergies  Allergen Reactions   Celecoxib Other (See Comments)    Unknown   Erythromycin Other (See Comments)    Unknown   Nabumetone Other (See Comments)    Unknown   Nsaids Other (See Comments)    Unknown Unknown    Oxycodone-Acetaminophen Other (See Comments)   Tyloxapol    Vioxx [Rofecoxib] Other (See Comments)    Unknown    Outpatient Encounter Medications as of 07/03/2021  Medication Sig   acetaminophen (TYLENOL) 325 MG tablet Take 650 mg by mouth every 6 (six) hours as needed for moderate pain or headache.    atorvastatin (LIPITOR) 10 MG tablet Take 1 tablet (10 mg total) by mouth  daily.   Cholecalciferol (VITAMIN D) 2000 units CAPS Take 2,000 Units by mouth daily.   diclofenac Sodium (VOLTAREN) 1 % GEL Apply 2 g topically daily as needed.   Fluocinolone Acetonide 0.01 % OIL fluocinolone acetonide oil 0.01 % ear drops  INSTILL 4 DROPS INTO BOTH EAR(S) FOR 14 DAYS. THEN STOP AND USE AS NEEDED FOR ITCHING.   metoprolol tartrate (LOPRESSOR) 25 MG tablet Take 1 tablet (25 mg total) by mouth 2 (two) times daily.   Multiple Vitamins-Minerals (WOMENS ONE DAILY) TABS Take 1 tablet by mouth daily.     Omega-3 Fatty Acids (FISH OIL) 1200 MG CAPS Take 1,200 mg by mouth daily.    [START ON 07/14/2021] rivaroxaban (XARELTO) 20 MG TABS tablet Take 1 tablet (20 mg total) by mouth daily with supper.   RIVAROXABAN (XARELTO) VTE STARTER PACK (15 & 20 MG) Follow package directions: Take one 15mg  tablet by mouth twice a day. On day 22, switch to one 20mg  tablet once a day. Take with food.   tamoxifen (NOLVADEX) 10 MG tablet Take 1 tablet (10 mg total) by mouth daily.   No facility-administered encounter medications on file as of 07/03/2021.    Patient Active Problem List   Diagnosis Date Noted   Chest pain at rest 06/13/2021   Chest pain 06/13/2021   SVT (supraventricular tachycardia) ([place])    Acute saddle pulmonary embolism ([place])    Enlarged thyroid 06/23/2019   Genetic testing 10/03/2018   Family history of breast cancer    Family history of pancreatic cancer    Family history of  prostate cancer    Presbycusis of both ears 08/25/2018   Laryngopharyngeal reflux (LPR) 06/10/2018   Invasive ductal carcinoma of left breast ([place]) 10/09/2017   Malignant neoplasm of upper-inner quadrant of left breast in female, estrogen receptor positive ([place]) 09/24/2017   Internal hemorrhoids 09/23/2014   Family history of colon cancer 06/30/2014   History of colonic polyps 06/30/2014   GERD (gastroesophageal reflux disease) 08/27/2012   Diverticulosis    Osteoporosis    Pelvic relaxation     Cystocele    H/O vitamin D deficiency    Hyperlipidemia LDL goal <130 11/08/2008   Essential hypertension 04/13/2008   Osteoarthritis 04/13/2008    Conditions to be addressed/monitored: HTN and Osteoarthritis.  Limited Social Support, Level of Care Concerns, ADL/IADL Limitations, Social Isolation, Limited Access to Caregiver, Cognitive Deficits, Memory Deficits, and Lacks Knowledge of [employer].  Care Plan : LCSW Plan of Care  Updates made by Francis Gaines, LCSW since 07/03/2021 12:00 AM     Problem: Receive Skilled Nursing Placement for [employer].   Priority: High     Goal: Receive Skilled Nursing Placement for [employer].   Start Date: 06/29/2021  Expected End Date: 09/29/2021  This Visit's Progress: On track  Recent Progress: On track  Priority: High  Note:   Current Barriers:  Chronic disease management support and education needs related to Mobility Issues, Fall Risk, Osteoporosis, Osteoarthritis and Hypertension. Inability to perform ADL's/IADL's independently, limited mobility, and now requiring 24 hour care and supervision.   Clinical Goal(s):  Patient's daughter will work with LCSW to identify and address any acute and chronic care coordination needs.    LCSW Interventions:  Inter-disciplinary care team collaboration (see longitudinal plan of care). Collaboration with Primary Care Physician, Dr. Jill Alexanders to request order and assistance with placement for patient into Inpatient Rehabilitation Program at [facility] with Primary Care Physician, Dr. Jill Alexanders to request completed and signed FL-2 Form. Supportive and Attentive Listening provided and Caregiver Stress acknowledged.   Patient Goals/Self-Care Activities:   Receive weekly/bi-weekly calls from LCSW, in an effort to coordinate skilled nursing facility placement for short-term rehabilitative services. Daughter will accept  call from Elyse Jarvis, [place] with Primary Care Physician, Dr. Obie Dredge office, scheduled for today, to discuss admission into the Inpatient Rehabilitation Program at [facility]. Contact LCSW directly [phone]) if you have questions, need assistance, or if additional social work needs are identified between now and our next scheduled telephone outreach call. Follow-Up Plan:  07/14/2021 at 12:30pm     Nat Christen, BSW, MSW, [place]  Licensed Clinical Social Worker  [place]  Mailing [place]. [address] [place], [place] [address] Physical Address-[address], [place], [place] [address] Toll Free Main # [phone] Fax # [phone] Cell # [phone]  [email or web address]

## 2021-07-05 ENCOUNTER — Other Ambulatory Visit: Payer: Medicare PPO | Admitting: *Deleted

## 2021-07-05 NOTE — Patient Outreach (Signed)
Care Management Clinical Social Work Note  07/05/2021 Name: Shannon Jensen MRN: [id number] DOB: 04/04/1928  Shannon Jensen is a 87 y.o. year old female who is a primary care patient of Shannon Lung, MD.  The Care Management team was consulted for assistance with chronic disease management and coordination needs.  Engaged with patient's daughter for follow-up visit in response to provider referral for social work chronic care management and care coordination services  Consent to Services:  [name] was given information about Care Management services today including:  Care Management services includes personalized support from designated clinical staff supervised by her physician, including individualized plan of care and coordination with other care providers 24/7 contact phone numbers for assistance for urgent and routine care needs. The patient may stop case management services at any time by phone call to the office staff.  Patient agreed to services and consent obtained.   Assessment: Review of patient past medical history, allergies, medications, and health status, including review of relevant consultants reports was performed today as part of a comprehensive evaluation and provision of chronic care management and care coordination services.  SDOH (Social Determinants of Health) assessments and interventions performed:    Advanced Directives Status: Not addressed in this encounter.  Care Plan  Allergies  Allergen Reactions   Celecoxib Other (See Comments)    Unknown   Erythromycin Other (See Comments)    Unknown   Nabumetone Other (See Comments)    Unknown   Nsaids Other (See Comments)    Unknown Unknown    Oxycodone-Acetaminophen Other (See Comments)   Tyloxapol    Vioxx [Rofecoxib] Other (See Comments)    Unknown    Outpatient Encounter Medications as of 07/05/2021  Medication Sig   acetaminophen (TYLENOL) 325 MG tablet Take 650 mg by mouth every 6 (six)  hours as needed for moderate pain or headache.    atorvastatin (LIPITOR) 10 MG tablet Take 1 tablet (10 mg total) by mouth daily.   Cholecalciferol (VITAMIN D) 2000 units CAPS Take 2,000 Units by mouth daily.   diclofenac Sodium (VOLTAREN) 1 % GEL Apply 2 g topically daily as needed.   Fluocinolone Acetonide 0.01 % OIL fluocinolone acetonide oil 0.01 % ear drops  INSTILL 4 DROPS INTO BOTH EAR(S) FOR 14 DAYS. THEN STOP AND USE AS NEEDED FOR ITCHING.   metoprolol tartrate (LOPRESSOR) 25 MG tablet Take 1 tablet (25 mg total) by mouth 2 (two) times daily.   Multiple Vitamins-Minerals (WOMENS ONE DAILY) TABS Take 1 tablet by mouth daily.     Omega-3 Fatty Acids (FISH OIL) 1200 MG CAPS Take 1,200 mg by mouth daily.    [START ON 07/14/2021] rivaroxaban (XARELTO) 20 MG TABS tablet Take 1 tablet (20 mg total) by mouth daily with supper.   RIVAROXABAN (XARELTO) VTE STARTER PACK (15 & 20 MG) Follow package directions: Take one 15mg  tablet by mouth twice a day. On day 22, switch to one 20mg  tablet once a day. Take with food.   tamoxifen (NOLVADEX) 10 MG tablet Take 1 tablet (10 mg total) by mouth daily.   No facility-administered encounter medications on file as of 07/05/2021.    Patient Active Problem List   Diagnosis Date Noted   Chest pain at rest 06/13/2021   Chest pain 06/13/2021   SVT (supraventricular tachycardia) ([place])    Acute saddle pulmonary embolism ([place])    Enlarged thyroid 06/23/2019   Genetic testing 10/03/2018   Family history of breast cancer    Family  history of pancreatic cancer    Family history of prostate cancer    Presbycusis of both ears 08/25/2018   Laryngopharyngeal reflux (LPR) 06/10/2018   Invasive ductal carcinoma of left breast ([place]) 10/09/2017   Malignant neoplasm of upper-inner quadrant of left breast in female, estrogen receptor positive ([place]) 09/24/2017   Internal hemorrhoids 09/23/2014   Family history of colon cancer 06/30/2014   History of colonic polyps  06/30/2014   GERD (gastroesophageal reflux disease) 08/27/2012   Diverticulosis    Osteoporosis    Pelvic relaxation    Cystocele    H/O vitamin D deficiency    Hyperlipidemia LDL goal <130 11/08/2008   Essential hypertension 04/13/2008   Osteoarthritis 04/13/2008    Conditions to be addressed/monitored: HTN.  Level of Care Concerns, ADL/IADL Limitations, Limited Access to Caregiver, Cognitive Deficits, Memory Deficits, and Lacks Knowledge of [employer].  Care Plan : LCSW Plan of Care  Updates made by Francis Gaines, LCSW since 07/05/2021 12:00 AM     Problem: Receive Skilled Nursing Placement for [employer].   Priority: High     Goal: Receive Skilled Nursing Placement for [employer].   Start Date: 06/29/2021  Expected End Date: 09/29/2021  This Visit's Progress: On track  Recent Progress: On track  Priority: High  Note:   Current Barriers:  Chronic disease management support and education needs related to Mobility Issues, Fall Risk, Osteoporosis, Osteoarthritis and Hypertension. Inability to perform ADL's/IADL's independently, limited mobility, and now requiring 24 hour care and supervision.   Clinical Goal(s):  Patient's daughter will work with LCSW to identify and address any acute and chronic care coordination needs.    LCSW Interventions:  Inter-disciplinary care team collaboration (see longitudinal plan of care). Collaboration with Primary Care Physician, Dr. Jill Alexanders to request order and assistance with placement for patient into Inpatient Rehabilitation Program at [facility] with [place], Dr. Jill Alexanders to request completed and signed FL-2 Form. Supportive and Attentive Listening provided and Caregiver Stress acknowledged.   Patient Goals/Self-Care Activities:   Receive weekly/bi-weekly calls from LCSW, in an effort to coordinate skilled nursing facility  placement for short-term rehabilitative services. LCSW collaboration with Shannon Jensen, RMA with Primary Care Physician, Dr. Obie Dredge office, to explain that patient's daughter is still awaiting and expecting a return call, wanting to discuss getting patient into the Inpatient Rehabilitation Program at [facility].  Request for Mrs. Mallie Mussel to contact daughter, [name] directly, to report findings. Contact LCSW directly (# [phone]) if you have questions, need assistance, or if additional social work needs are identified between now and our next scheduled telephone outreach call. Follow-Up Plan:  07/14/2021 at 12:30pm     Nat Christen, BSW, MSW, [place]  Licensed Clinical Social Worker  [place]  Mailing [place]. [address], [place], [place] [address] Physical Address-[address] [place], [place] [address] Toll Free Main # [phone] Fax # [phone] Cell # [phone]  [email or web address]

## 2021-07-06 ENCOUNTER — Other Ambulatory Visit: Payer: Self-pay

## 2021-07-06 NOTE — Patient Outreach (Signed)
[facility]) Care Management  [place]  07/06/2021   Shannon Jensen 1927-12-18 [id number]  Subjective: Incoming call from daughter.  She reports she is still trying to get placement for her mother. She has not heard from the physician office. She is however working with LCSW, [employer] on placement. Family is considering home health at this point.  Advised daughter that CM would call MD office to request.    Telephone call to Dr. Lanice Shirts office.  Message left for nurse Maudie Mercury.    Objective:   Encounter Medications:  Outpatient Encounter Medications as of 07/06/2021  Medication Sig   acetaminophen (TYLENOL) 325 MG tablet Take 650 mg by mouth every 6 (six) hours as needed for moderate pain or headache.    atorvastatin (LIPITOR) 10 MG tablet Take 1 tablet (10 mg total) by mouth daily.   Cholecalciferol (VITAMIN D) 2000 units CAPS Take 2,000 Units by mouth daily.   diclofenac Sodium (VOLTAREN) 1 % GEL Apply 2 g topically daily as needed.   Fluocinolone Acetonide 0.01 % OIL fluocinolone acetonide oil 0.01 % ear drops  INSTILL 4 DROPS INTO BOTH EAR(S) FOR 14 DAYS. THEN STOP AND USE AS NEEDED FOR ITCHING.   metoprolol tartrate (LOPRESSOR) 25 MG tablet Take 1 tablet (25 mg total) by mouth 2 (two) times daily.   Multiple Vitamins-Minerals (WOMENS ONE DAILY) TABS Take 1 tablet by mouth daily.     Omega-3 Fatty Acids (FISH OIL) 1200 MG CAPS Take 1,200 mg by mouth daily.    [START ON 07/14/2021] rivaroxaban (XARELTO) 20 MG TABS tablet Take 1 tablet (20 mg total) by mouth daily with supper.   RIVAROXABAN (XARELTO) VTE STARTER PACK (15 & 20 MG) Follow package directions: Take one 15mg  tablet by mouth twice a day. On day 22, switch to one 20mg  tablet once a day. Take with food.   tamoxifen (NOLVADEX) 10 MG tablet Take 1 tablet (10 mg total) by mouth daily.   No facility-administered encounter medications on file as of 07/06/2021.    Functional Status:  In your  present state of health, do you have any difficulty performing the following activities: 06/29/2021 06/29/2021  Hearing? Y Y  Comment Hard of Hearing HOH-working to get heearing aids  Vision? N N  Difficulty concentrating or making decisions? N N  Walking or climbing stairs? Y Y  Comment Shortness of Breath and Unsteady Gait/Balance. some shortness of breath  Dressing or bathing? Y Y  Comment Daugher - Shannon Jensen Assists. daughter helps  Doing errands, shopping? Y Y  Comment Daugher - Shannon Jensen Assists. daughter helps  Conservation officer, nature and eating ? Y Y  Comment Daugher - Shannon Jensen Assists. daughter helps  Using the Toilet? N N  In the past six months, have you accidently leaked urine? Shannon Jensen  Comment Wears Depends. depends  Do you have problems with loss of bowel control? N N  Managing your Medications? Y Y  Comment Daugher - Shannon Jensen Assists. daughter helps  Managing your Finances? Y Y  Comment Daugher - Shannon Jensen Assists. daughter helps  Housekeeping or managing your Housekeeping? Y Y  Comment Daugher - Shannon Jensen Assists. daughter helps  Some recent data might be hidden    Fall/Depression Screening: Fall Risk  06/29/2021 06/29/2021 07/19/2020  Falls in the past year? 0 0 0  Number falls in past yr: 0 - -  Injury with Fall? 0 - -  Risk for fall due to : Impaired balance/gait;Impaired mobility;Mental  status change - -  Follow up Falls evaluation completed;Education provided;Falls prevention discussed - -   PHQ 2/9 Scores 06/29/2021 06/29/2021 07/19/2020 07/09/2019 01/14/2018 09/30/2015 08/09/2015  PHQ - 2 Score 0 - 0 0 0 0 0  Exception Documentation Other- indicate reason in comment box Other- indicate reason in comment box - - - - -  Not completed Verified by Daugher - Shannon Jensen daughter Shannon Jensen completed assessment - - - - -    Assessment:   Care Plan Care Plan : General Plan of Care (Adult)  Updates made by Shannon Billings, RN since 07/06/2021 12:00 AM      Problem: Health Promotion or Disease Self-Management (General Plan of Care)      Goal: [place] as evidenced by family following up on any resources given.   Start Date: 06/29/2021  Expected End Date: 08/09/2021  This Visit's Progress: On track  Priority: High  Note:   Evidence-based guidance:  Review biopsychosocial determinants of health screens.  Determine level of modifiable health risk.  Assess level of patient activation, level of readiness, importance and confidence to make changes.  Evoke change talk using open-ended questions, pros and cons, as well as looking forward.  Identify areas where behavior change may lead to improved health.  Partner with patient to develop a robust self-management plan that includes lifestyle factors, such as weight loss, exercise and healthy nutrition, as well as goals specific to disease risks.  Support patient and family/caregiver active participation in decision-making and self-management plan.  Implement additional goals and interventions based on identified risk factors to reduce health risk.  Facilitate advance care planning.  Review need for preventive screening based on age, sex, family history and health history.   Notes:     Task: Mutually Develop and [name] Achievement of Patient Goals   Note:   Care Management Activities:    - collaboration with team encouraged - questions answered - reassurance provided - resources needed to meet goals identified    Notes: 06/29/21 Patient with recent hospitalization. Patient recommended for rehab but patient declined due to explanation given.  Family and patient agreeable to rehab at this time and need some assistance with placement.  CM did give information of local rehabs to check out.   07/06/21 Spoke with daughter Shannon Jensen.  She reports some frustration with getting her mother in a facility.  She reports that MD office has not gotten back with her.  She states that  they are considering home health at this point as patient needs some therapy. However, daughter states they would prefer her go to a facility.      Problem: Therapeutic Alliance (General Plan of Care)      Goal: Therapeutic Alliance Established as evidenced by family engaging with care manager   Start Date: 06/29/2021  Expected End Date: 08/09/2021  This Visit's Progress: On track  Priority: High  Note:   Evidence-based guidance:  Avoid value judgments; convey acceptance.  Encourage collaboration with the treatment team.  Establish rapport; develop trust relationship.  Therapist, art.  Provide emotional support; encourage patient to share feelings of anger, fear and anxiety.  Promote self-reliance and autonomy based on age and ability; discourage overprotection.  Use empathy and nonjudgmental, participatory manner.   Notes:     Task: Develop Relationship to Effect Behavior Change   Note:   Care Management Activities:    - acceptance conveyed - care explained - collaboration with team encouraged - questions answered - reassurance provided  Notes: 06/29/21 Family agreeable to care management assistance for help with placement. Patient recently hospitalized and needs assistance with care 24/7.   07/06/21 Daughter continues to engage with CM. Daughter expresses some frustration in the process. She is working with the [employer] account planner.  Telephone call to physician to request home health POT/OT evaluation. Message left for Kim.      Goals Addressed               This Visit's Progress     Find Help in My Community (pt-stated)   On track     Barriers: Knowledge Timeframe:  Short-Term Goal Priority:  High Start Date:     06/29/21                        Expected End Date:      08/09/21                 Follow Up Date 08/09/21   - follow-up on any referrals for help I am given - think ahead to make sure my need does not become an emergency - have a back-up  plan    Why is this important?   Knowing how and where to find help for yourself or family in your neighborhood and community is an important skill.  You will want to take some steps to learn how.    Notes: 06/29/21 Patient with recent hospitalization. Patient was recommended rehab but patient declined as she did not understand. Patient and family now wanting rehab placement. Care Manager did give family names of area rehab centers to follow up on.   07/06/21 Telephone call to Dr. Lanice Shirts office for request for home health services. Family engaged with LCSW, [employer].       [place] My Quality of Life   On track     Barriers: None Timeframe:  Short-Term Goal Priority:  High Start Date:   06/29/21                          Expected End Date:    08/09/21                   Follow Up Date 08/09/21 - check out options for in-home help, long-term care or hospice    Why is this important?   Having a long-term illness can be scary.  It can also be stressful for you and your caregiver.  These steps may help.    Notes: 06/29/21 Patient recent hospitalization and declined rehab.  Family and patient trying to seek placement now. Patient also declined home health.  Resources given  for long term care facilities local.  Social work referral done.   07/06/21 Daughter providing 24/7 care for patient.  Family seeking short term rehab for strengthening.          Plan:  Follow-up: Patient agrees to Care Plan and Follow-up. Follow-up in 1/2 week(s)  Jone Baseman, RN, MSN [place] Management Care Management Coordinator Direct Line [phone] Cell [phone] Toll Free: [phone]  Fax: [phone]

## 2021-07-06 NOTE — Patient Outreach (Signed)
[facility] [facility]) Care Management  07/06/2021  Shannon Jensen April 01, 1928 [id number]   Telephone call to daughter [name] for follow up. No answer. HIPAA compliant voice message left.   Plan: RN CM will attempt again within 4 business days.  Jone Baseman, RN, MSN [place] Management Care Management Coordinator Direct Line [phone] Cell [phone] Toll Free: [phone]  Fax: [phone]

## 2021-07-07 ENCOUNTER — Ambulatory Visit: Payer: Self-pay

## 2021-07-11 ENCOUNTER — Telehealth: Payer: Self-pay

## 2021-07-11 NOTE — Telephone Encounter (Signed)
Called Ms Shannon Jensen back to find out how we could help the pt with in patient rehab. Gas

## 2021-07-12 ENCOUNTER — Other Ambulatory Visit: Payer: Self-pay

## 2021-07-12 ENCOUNTER — Telehealth (INDEPENDENT_AMBULATORY_CARE_PROVIDER_SITE_OTHER): Payer: Medicare PPO | Admitting: Family Medicine

## 2021-07-12 ENCOUNTER — Encounter: Payer: Self-pay | Admitting: Family Medicine

## 2021-07-12 VITALS — BP 122/68 | Wt 144.0 lb

## 2021-07-12 DIAGNOSIS — Z9181 History of falling: Secondary | ICD-10-CM | POA: Diagnosis not present

## 2021-07-12 DIAGNOSIS — R609 Edema, unspecified: Secondary | ICD-10-CM | POA: Diagnosis not present

## 2021-07-12 DIAGNOSIS — R000 Tachycardia, unspecified: Secondary | ICD-10-CM | POA: Diagnosis not present

## 2021-07-12 MED ORDER — METOPROLOL TARTRATE 25 MG PO TABS: 25.0000 mg | ORAL_TABLET | Freq: Two times a day (BID) | ORAL | 2 refills | Status: DC

## 2021-07-12 NOTE — Progress Notes (Signed)
   Subjective:    Patient ID: Shannon Jensen, female    DOB: 26-May-1928, 87 y.o.   MRN: [id number]  HPI Documentation for virtual audio and video telecommunications through [place] encounter: The patient was located at home. 2 patient identifiers used.  The provider was located in the office. The patient did consent to this visit and is aware of possible charges through their insurance for this visit. The other persons participating in this telemedicine service was with her daughter Time spent on call was 5 minutes and in review of previous records >20 minutes total for counseling and coordination of care. This virtual service is not related to other E/M service within previous 7 days.  Today's visit is to fill out an FL 2 form.  When she left the hospital she declined being put into skilled nursing but now she and her 2 children recognize the need for this to occur as they do not have the time or the energy to devote to this.  She also notes that over the last several weeks she is noted increased difficulty with edema in her feet that usually goes away throughout the night and starts to build up during the day.  No chest pain, shortness of breath or PND.  She also needs a refill on her metoprolol.  Review of Systems     Objective:   Physical Exam Alert and in no distress.  She is oriented x3.  She does have difficulty with urinary incontinence and does need help with bathing and with dressing.       Assessment & Plan:  Tachycardia - Plan: metoprolol tartrate (LOPRESSOR) 25 MG tablet  At high risk for injury related to fall  Dependent edema FL 2 was filled out. Also recommend she keep her feet elevated and use support stockings help with the edema.

## 2021-07-14 ENCOUNTER — Other Ambulatory Visit: Payer: Self-pay | Admitting: *Deleted

## 2021-07-16 NOTE — Patient Outreach (Addendum)
Care Management Clinical Social Work Note  07/16/2021 Name: Shannon Jensen MRN: [id number] DOB: 06-20-1928  Shannon Jensen is a 87 y.o. year old female who is a primary care patient of Shannon Lung, MD.  The Care Management team was consulted for assistance with chronic disease management and coordination needs.  Engaged with patient and daughter by telephone for follow-up visit in response to provider referral for social work chronic care management and care coordination services  Consent to Services:  Shannon Jensen was given information about Care Management services today including:  Care Management services includes personalized support from designated clinical staff supervised by her physician, including individualized plan of care and coordination with other care providers 24/7 contact phone numbers for assistance for urgent and routine care needs. The patient may stop case management services at any time by phone call to the office staff.  Patient agreed to services and consent obtained.   Assessment: Review of patient past medical history, allergies, medications, and health status, including review of relevant consultants reports was performed today as part of a comprehensive evaluation and provision of chronic care management and care coordination services.  SDOH (Social Determinants of Health) assessments and interventions performed:    Advanced Directives Status: Not addressed in this encounter.  Care Plan  Allergies  Allergen Reactions   Celecoxib Other (See Comments)    Unknown   Erythromycin Other (See Comments)    Unknown   Nabumetone Other (See Comments)    Unknown   Nsaids Other (See Comments)    Unknown Unknown    Oxycodone-Acetaminophen Other (See Comments)   Tyloxapol    Vioxx [Rofecoxib] Other (See Comments)    Unknown    Outpatient Encounter Medications as of 07/14/2021  Medication Sig   acetaminophen (TYLENOL) 325 MG tablet Take 650 mg by mouth  every 6 (six) hours as needed for moderate pain or headache.    atorvastatin (LIPITOR) 10 MG tablet Take 1 tablet (10 mg total) by mouth daily. (Patient not taking: Reported on 07/12/2021)   Cholecalciferol (VITAMIN D) 2000 units CAPS Take 2,000 Units by mouth daily.   diclofenac Sodium (VOLTAREN) 1 % GEL Apply 2 g topically daily as needed.   Fluocinolone Acetonide 0.01 % OIL fluocinolone acetonide oil 0.01 % ear drops  INSTILL 4 DROPS INTO BOTH EAR(S) FOR 14 DAYS. THEN STOP AND USE AS NEEDED FOR ITCHING.   metoprolol tartrate (LOPRESSOR) 25 MG tablet Take 1 tablet (25 mg total) by mouth 2 (two) times daily.   Multiple Vitamins-Minerals (WOMENS ONE DAILY) TABS Take 1 tablet by mouth daily.     Omega-3 Fatty Acids (FISH OIL) 1200 MG CAPS Take 1,200 mg by mouth daily.    rivaroxaban (XARELTO) 20 MG TABS tablet Take 1 tablet (20 mg total) by mouth daily with supper.   RIVAROXABAN (XARELTO) VTE STARTER PACK (15 & 20 MG) Follow package directions: Take one 15mg  tablet by mouth twice a day. On day 22, switch to one 20mg  tablet once a day. Take with food.   tamoxifen (NOLVADEX) 10 MG tablet Take 1 tablet (10 mg total) by mouth daily.   No facility-administered encounter medications on file as of 07/14/2021.    Patient Active Problem List   Diagnosis Date Noted   Chest pain at rest 06/13/2021   Chest pain 06/13/2021   SVT (supraventricular tachycardia) ([place])    Acute saddle pulmonary embolism ([place])    Enlarged thyroid 06/23/2019   Genetic testing 10/03/2018   Family history of  breast cancer    Family history of pancreatic cancer    Family history of prostate cancer    Presbycusis of both ears 08/25/2018   Laryngopharyngeal reflux (LPR) 06/10/2018   Invasive ductal carcinoma of left breast ([place]) 10/09/2017   Malignant neoplasm of upper-inner quadrant of left breast in female, estrogen receptor positive ([place]) 09/24/2017   Internal hemorrhoids 09/23/2014   Family history of colon cancer  06/30/2014   History of colonic polyps 06/30/2014   GERD (gastroesophageal reflux disease) 08/27/2012   Diverticulosis    Osteoporosis    Pelvic relaxation    Cystocele    H/O vitamin D deficiency    Hyperlipidemia LDL goal <130 11/08/2008   Essential hypertension 04/13/2008   Osteoarthritis 04/13/2008    Conditions to be addressed/monitored: HTN and Osteoporosis.  Limited Social Support, Level of Care Concerns, ADL/IADL Limitations, Limited Access to Caregiver, and Lacks Knowledge of [employer].  Care Plan : LCSW Plan of Care  Updates made by Shannon Gaines, LCSW since 07/16/2021 12:00 AM     Problem: Receive Skilled Nursing Placement for [employer].   Priority: High     Goal: Receive Skilled Nursing Placement for [employer].   Start Date: 06/29/2021  Expected End Date: 09/29/2021  This Visit's Progress: On track  Recent Progress: On track  Priority: High  Note:   Current Barriers:  Chronic disease management support and education needs related to Mobility Issues, Fall Risk, Osteoporosis, Osteoarthritis and Hypertension. Inability to perform ADL's/IADL's independently, limited mobility, and now requiring 24 hour care and supervision.   Clinical Goal(s):  Patient and daughter will work with LCSW to identify and address any acute and chronic care coordination needs. Patient and daughter will work with LCSW to pursue skilled nursing facility placement for short-term rehabilitative services, versus home health physical and occupational therapy services.  LCSW Interventions:  Inter-disciplinary care team collaboration (see longitudinal plan of care). Collaboration with Primary Care Physician, Dr. Jill Alexanders to request order and assistance with placement for patient into Inpatient Rehabilitation Program at [facility]. Supportive and Attentive Listening provided and Caregiver Stress acknowledged.   Patient  Goals/Self-Care Activities:   Receive weekly/bi-weekly calls from LCSW, in an effort to coordinate skilled nursing facility placement for short-term rehabilitative services, versus home health physical and occupational therapy services for strengthening, mobility, safety, ambulation, etc. LCSW collaboration with Elyse Jarvis, Registered Medical Assistant for Primary Care Physician, Dr. Jill Alexanders, to request order for home health physical and occupational therapy services, per patient and daughter's request.   FL-2 Form faxed to facilities of interest, which include [facility] and [facility], per patient and daughter's request. LCSW collaboration with admissions coordinators at [facility] and [facility], to ensure receipt of FL-2 Form. Accept all calls from admissions coordinators at [facility] and [facility], in an effort to secure a bed offer. Contact LCSW directly (# [phone]) if you have questions, need assistance, or if additional social work needs are identified between now and our next scheduled telephone outreach call. Follow-Up Plan:  07/25/2021 at 4:00pm

## 2021-07-17 ENCOUNTER — Other Ambulatory Visit: Payer: Self-pay

## 2021-07-17 NOTE — Patient Outreach (Signed)
[facility]) Care Management  [place]  07/17/2021   Shannon Jensen [birth year] [id number]  Subjective: Spoke with daughter Shannon Jensen. Still waiting on reply from placement.  She is frustrated due to length it is taking.  Explained that FL-2 was completed by physician and that now facilities have to reply.  She verbalized understanding.  She states patient needs help with all aspects of care and family really wanting her mother to go to a facility.  Reassured her that once facilities respond with bed offers we can move forward.  She verbalized understanding.    Objective:   Encounter Medications:  Outpatient Encounter Medications as of 07/17/2021  Medication Sig   acetaminophen (TYLENOL) 325 MG tablet Take 650 mg by mouth every 6 (six) hours as needed for moderate pain or headache.    atorvastatin (LIPITOR) 10 MG tablet Take 1 tablet (10 mg total) by mouth daily. (Patient not taking: Reported on 07/12/2021)   Cholecalciferol (VITAMIN D) 2000 units CAPS Take 2,000 Units by mouth daily.   diclofenac Sodium (VOLTAREN) 1 % GEL Apply 2 g topically daily as needed.   Fluocinolone Acetonide 0.01 % OIL fluocinolone acetonide oil 0.01 % ear drops  INSTILL 4 DROPS INTO BOTH EAR(S) FOR 14 DAYS. THEN STOP AND USE AS NEEDED FOR ITCHING.   metoprolol tartrate (LOPRESSOR) 25 MG tablet Take 1 tablet (25 mg total) by mouth 2 (two) times daily.   Multiple Vitamins-Minerals (WOMENS ONE DAILY) TABS Take 1 tablet by mouth daily.     Omega-3 Fatty Acids (FISH OIL) 1200 MG CAPS Take 1,200 mg by mouth daily.    rivaroxaban (XARELTO) 20 MG TABS tablet Take 1 tablet (20 mg total) by mouth daily with supper.   RIVAROXABAN (XARELTO) VTE STARTER PACK (15 & 20 MG) Follow package directions: Take one 15mg  tablet by mouth twice a day. On day 22, switch to one 20mg  tablet once a day. Take with food.   tamoxifen (NOLVADEX) 10 MG tablet Take 1 tablet (10 mg total) by mouth daily.   No  facility-administered encounter medications on file as of 07/17/2021.    Functional Status:  In your present state of health, do you have any difficulty performing the following activities: 06/29/2021 06/29/2021  Hearing? Y Y  Comment Hard of Hearing HOH-working to get heearing aids  Vision? N N  Difficulty concentrating or making decisions? N N  Walking or climbing stairs? Y Y  Comment Shortness of Breath and Unsteady Gait/Balance. some shortness of breath  Dressing or bathing? Y Y  Comment Daugher - Shannon Jensen Assists. daughter helps  Doing errands, shopping? Y Y  Comment Daugher - Shannon Jensen Assists. daughter helps  Conservation officer, nature and eating ? Y Y  Comment Daugher - Shannon Jensen Assists. daughter helps  Using the Toilet? N N  In the past six months, have you accidently leaked urine? Shannon Jensen  Comment Wears Depends. depends  Do you have problems with loss of bowel control? N N  Managing your Medications? Y Y  Comment Daugher - Shannon Jensen Assists. daughter helps  Managing your Finances? Y Y  Comment Daugher - Shannon Jensen Assists. daughter helps  Housekeeping or managing your Housekeeping? Y Y  Comment Daugher - Shannon Jensen Assists. daughter helps  Some recent data might be hidden    Fall/Depression Screening: Fall Risk  06/29/2021 06/29/2021 07/19/2020  Falls in the past year? 0 0 0  Number falls in past yr: 0 - -  Injury with  Fall? 0 - -  Risk for fall due to : Impaired balance/gait;Impaired mobility;Mental status change - -  Follow up Falls evaluation completed;Education provided;Falls prevention discussed - -   PHQ 2/9 Scores 06/29/2021 06/29/2021 07/19/2020 07/09/2019 01/14/2018 09/30/2015 08/09/2015  PHQ - 2 Score 0 - 0 0 0 0 0  Exception Documentation Other- indicate reason in comment box Other- indicate reason in comment box - - - - -  Not completed Verified by Daugher - Shannon Jensen daughter Shannon Jensen completed assessment - - - - -    Assessment:   Care  Plan Care Plan : General Plan of Care (Adult)  Updates made by Shannon Billings, RN since 07/17/2021 12:00 AM     Problem: Health Promotion or Disease Self-Management (General Plan of Care)      Goal: Shannon Jensen as evidenced by family following up on any resources given.   Start Date: 06/29/2021  Expected End Date: 08/09/2021  This Visit's Progress: On track  Recent Progress: On track  Priority: High  Note:   Evidence-based guidance:  Review biopsychosocial determinants of health screens.  Determine level of modifiable health risk.  Assess level of patient activation, level of readiness, importance and confidence to make changes.  Evoke change talk using open-ended questions, pros and cons, as well as looking forward.  Identify areas where behavior change may lead to improved health.  Partner with patient to develop a robust self-management plan that includes lifestyle factors, such as weight loss, exercise and healthy nutrition, as well as goals specific to disease risks.  Support patient and family/caregiver active participation in decision-making and self-management plan.  Implement additional goals and interventions based on identified risk factors to reduce health risk.  Facilitate advance care planning.  Review need for preventive screening based on age, sex, family history and health history.   Notes:     Task: Mutually Develop and Shannon Jensen Achievement of Patient Goals   Note:   Care Management Activities:    - collaboration with team encouraged - questions answered - reassurance provided    Notes: 06/29/21 Patient with recent hospitalization. Patient recommended for rehab but patient declined due to explanation given.  Family and patient agreeable to rehab at this time and need some assistance with placement.  CM did give information of local rehabs to check out.   07/06/21 Spoke with daughter Shannon Jensen.  She reports some frustration with getting her mother  in a facility.  She reports that MD office has not gotten back with her.  She states that they are considering home health at this point as patient needs some therapy. However, daughter states they would prefer her go to a facility.   07/17/21 Spoke with daughter still waiting on placement.  FL-2 has been sent and waiting response.      Problem: Therapeutic Alliance (General Plan of Care)      Goal: Therapeutic Alliance Established as evidenced by family engaging with care manager Completed 07/17/2021  Start Date: 06/29/2021  Expected End Date: 08/09/2021  Recent Progress: On track  Priority: High  Note:   Evidence-based guidance:  Avoid value judgments; convey acceptance.  Encourage collaboration with the treatment team.  Establish rapport; develop trust relationship.  Therapist, art.  Provide emotional support; encourage patient to share feelings of anger, fear and anxiety.  Promote self-reliance and autonomy based on age and ability; discourage overprotection.  Use empathy and nonjudgmental, participatory manner.   Notes:       Goals Addressed  This Visit's Progress     Find Help in My Community (pt-stated)   On track     Barriers: Knowledge Timeframe:  Short-Term Goal Priority:  High Start Date:     06/29/21                        Expected End Date:      08/09/21                 Follow Up Date 08/09/21   - follow-up on any referrals for help I am given - think ahead to make sure my need does not become an emergency - have a back-up plan    Why is this important?   Knowing how and where to find help for yourself or family in your neighborhood and community is an important skill.  You will want to take some steps to learn how.    Notes: 06/29/21 Patient with recent hospitalization. Patient was recommended rehab but patient declined as she did not understand. Patient and family now wanting rehab placement. Care Manager did give family names of  area rehab centers to follow up on.   07/06/21 Telephone call to Dr. Lanice Shirts office for request for home health services. Family engaged with LCSW, [employer].  07/17/21 Patient continues to wait placement.  FL-2 sent.       [facility] My Quality of Life   On track     Barriers: None Timeframe:  Short-Term Goal Priority:  High Start Date:   06/29/21                          Expected End Date:    08/09/21                   Follow Up Date 08/09/21 - check out options for in-home help, long-term care or hospice    Why is this important?   Having a long-term illness can be scary.  It can also be stressful for you and your caregiver.  These steps may help.    Notes: 06/29/21 Patient recent hospitalization and declined rehab.  Family and patient trying to seek placement now. Patient also declined home health.  Resources given  for long term care facilities local.  Social work referral done.   07/06/21 Daughter providing 24/7 care for patient.  Family seeking short term rehab for strengthening.   07/17/21 FL-2 sent to facilities. Waiting on reply from facilities.         Plan:  Follow-up: Patient agrees to Care Plan and Follow-up. Follow-up in 3-4 week(s)  Jone Baseman, RN, MSN [place] Management Care Management Coordinator Direct Line [phone] Cell [phone] Toll Free: [phone]  Fax: [phone]

## 2021-07-18 ENCOUNTER — Other Ambulatory Visit: Payer: Self-pay | Admitting: Family Medicine

## 2021-07-18 ENCOUNTER — Ambulatory Visit: Payer: Self-pay

## 2021-07-20 ENCOUNTER — Encounter: Payer: Self-pay | Admitting: Family Medicine

## 2021-07-20 ENCOUNTER — Encounter: Payer: Self-pay | Admitting: *Deleted

## 2021-07-20 ENCOUNTER — Other Ambulatory Visit: Payer: Self-pay

## 2021-07-20 ENCOUNTER — Ambulatory Visit: Payer: Medicare PPO | Admitting: Family Medicine

## 2021-07-20 VITALS — BP 148/70 | HR 69 | Temp 97.0°F | Ht 62.0 in | Wt 149.6 lb

## 2021-07-20 DIAGNOSIS — Z0000 Encounter for general adult medical examination without abnormal findings: Secondary | ICD-10-CM | POA: Diagnosis not present

## 2021-07-20 DIAGNOSIS — I2692 Saddle embolus of pulmonary artery without acute cor pulmonale: Secondary | ICD-10-CM

## 2021-07-20 DIAGNOSIS — I700 Atherosclerosis of aorta: Secondary | ICD-10-CM

## 2021-07-20 DIAGNOSIS — M810 Age-related osteoporosis without current pathological fracture: Secondary | ICD-10-CM

## 2021-07-20 DIAGNOSIS — E785 Hyperlipidemia, unspecified: Secondary | ICD-10-CM | POA: Diagnosis not present

## 2021-07-20 DIAGNOSIS — M1990 Unspecified osteoarthritis, unspecified site: Secondary | ICD-10-CM | POA: Diagnosis not present

## 2021-07-20 DIAGNOSIS — I471 Supraventricular tachycardia, unspecified: Secondary | ICD-10-CM

## 2021-07-20 DIAGNOSIS — R627 Adult failure to thrive: Secondary | ICD-10-CM | POA: Diagnosis not present

## 2021-07-20 DIAGNOSIS — I10 Essential (primary) hypertension: Secondary | ICD-10-CM | POA: Diagnosis not present

## 2021-07-20 DIAGNOSIS — Z23 Encounter for immunization: Secondary | ICD-10-CM | POA: Diagnosis not present

## 2021-07-20 DIAGNOSIS — E041 Nontoxic single thyroid nodule: Secondary | ICD-10-CM | POA: Diagnosis not present

## 2021-07-20 DIAGNOSIS — H9113 Presbycusis, bilateral: Secondary | ICD-10-CM

## 2021-07-20 DIAGNOSIS — K219 Gastro-esophageal reflux disease without esophagitis: Secondary | ICD-10-CM

## 2021-07-20 DIAGNOSIS — Z8639 Personal history of other endocrine, nutritional and metabolic disease: Secondary | ICD-10-CM

## 2021-07-20 DIAGNOSIS — C50212 Malignant neoplasm of upper-inner quadrant of left female breast: Secondary | ICD-10-CM

## 2021-07-20 DIAGNOSIS — Z170 Estrogen receptor positive status [ER+]: Secondary | ICD-10-CM

## 2021-07-20 MED ORDER — IBANDRONATE SODIUM 150 MG PO TABS: 150.0000 mg | ORAL_TABLET | ORAL | 3 refills | Status: DC

## 2021-07-20 NOTE — Patient Instructions (Signed)
Ms. Bickham , Thank you for taking time to come for your Medicare Wellness Visit. I appreciate your ongoing commitment to your health goals. Please review the following plan we discussed and let me know if I can assist you in the future.   These are the goals we discussed:  Goals       Find Help in My Community (pt-stated)      Barriers: Knowledge Timeframe:  Short-Term Goal Priority:  High Start Date:     06/29/21                        Expected End Date:      08/09/21                 Follow Up Date 08/09/21   - follow-up on any referrals for help I am given - think ahead to make sure my need does not become an emergency - have a back-up plan    Why is this important?   Knowing how and where to find help for yourself or family in your neighborhood and community is an important skill.  You will want to take some steps to learn how.    Notes: 06/29/21 Patient with recent hospitalization. Patient was recommended rehab but patient declined as she did not understand. Patient and family now wanting rehab placement. Care Manager did give family names of area rehab centers to follow up on.   07/06/21 Telephone call to Dr. Lanice Shirts office for request for home health services. Family engaged with LCSW, [employer].  07/17/21 Patient continues to wait placement.  FL-2 sent.       [facility] My Quality of Life      Barriers: None Timeframe:  Short-Term Goal Priority:  High Start Date:   06/29/21                          Expected End Date:    08/09/21                   Follow Up Date 08/09/21 - check out options for in-home help, long-term care or hospice    Why is this important?   Having a long-term illness can be scary.  It can also be stressful for you and your caregiver.  These steps may help.    Notes: 06/29/21 Patient recent hospitalization and declined rehab.  Family and patient trying to seek placement now. Patient also declined home health.  Resources given  for long term  care facilities local.  Social work referral done.   07/06/21 Daughter providing 24/7 care for patient.  Family seeking short term rehab for strengthening.   07/17/21 FL-2 sent to facilities. Waiting on reply from facilities.       Receive Skilled Nursing Placement for Short-Term Rehabilitative Services.      Follow-Up Date: 07/25/2021 at 4:00pm  Patient Goals/Self-Care Activities:   Receive weekly/bi-weekly calls from LCSW, in an effort to coordinate skilled nursing facility placement for short-term rehabilitative services, versus home health physical and occupational therapy services for strengthening, mobility, safety, ambulation, etc. LCSW collaboration with Elyse Jarvis, Registered Medical Assistant for Primary Care Physician, Dr. Jill Alexanders, to request order for home health physical and occupational therapy services, per patient and daughter's request.   FL-2 Form faxed to facilities of interest, which include [facility] and [facility], per patient and daughter's request. LCSW collaboration with admissions coordinators at [facility] and [facility], to ensure receipt of FL-2 Form. Accept all calls from admissions coordinators at [facility] and [facility], in an effort to secure a bed offer. Contact LCSW directly (# [id number]) if you have questions, need assistance, or if additional social work needs are identified between now and our next scheduled telephone outreach call.        This is a list of the screening recommended for you and due dates:  Health Maintenance  Topic Date Due   Mammogram  06/21/2021   COVID-19 Vaccine (4 - Booster for Pfizer series) 08/17/2021*   Zoster (Shingles) Vaccine (2 of 2) 07/25/2022*   Tetanus Vaccine  04/13/2030   Pneumonia Vaccine  Completed   Flu Shot  Completed   DEXA scan (bone density measurement)  Completed   HPV Vaccine  Aged Out   Pap Smear  Discontinued  *Topic was postponed. The  date shown is not the original due date.  Take extra vitamin D and calcium in the form of [place]

## 2021-07-20 NOTE — Progress Notes (Addendum)
Tillie Rung Orebaugh thing they did not like what I put in there I have something about home health will be involved Shannon Jensen is a 87 y.o. female who presents for annual wellness visit ,CPE and follow-up on chronic medical conditions.  She was recently treated for pulmonary embolus and presently is on Xarelto.  She did have SVT during the treatment of her PE.  She did did fairly well with the treatment but was supposed to go into rehab but elected to go home.  She now realizes that rehab is important because she is having difficulty with her ADLs and being able to function on her own.  Her children cannot be there for her on a 24/7 basis.  She does have a history of osteoporosis and so far has not been on any medications.  She also has a history of arthritis and is seeing orthopedics for this.  Injections are the next up for her but she realizes with Xarelto that is not an option.  She has a vitamin D deficiency and has not had that checked recently.  She also has reflux symptoms and does use Tums but is also used Pepcid in the past.  Continues on tamoxifen for treatment of her underlying breast cancer.  She also has a history of aortic atherosclerosis shown on x-rays.  Recent scan also showed evidence of a thyroid nodule that will need to be followed up on.  She does have difficulty with presbycusis but is not interested in wearing hearing aids.  Immunizations and Health Maintenance Immunization History  Administered Date(s) Administered   Fluad Quad(high Dose 65+) 07/09/2019, 07/19/2020, 06/23/2021   Influenza Split 06/13/2012, 06/30/2015   Influenza Whole 08/10/2009, 07/25/2010   Influenza, High Dose Seasonal PF 08/15/2016, 05/29/2017, 11/24/2018   PFIZER(Purple Top)SARS-COV-2 Vaccination 11/20/2019, 12/14/2019, 09/01/2020   Pneumococcal Conjugate-13 05/18/2014   Pneumococcal Polysaccharide-23 05/28/2007   Tdap 04/13/2020   Zoster Recombinat (Shingrix) 08/28/2017   Health Maintenance Due   Topic Date Due   MAMMOGRAM  06/21/2021    Last Pap smear: aged out  Last mammogram:06/21/20 aged out Last colonoscopy: age out 06/10/2001 Last DEXA:10/08/19 aged out Dentist: Q year if she has a problem Ophtho: Q year Exercise: stretching  and walking seven days a week  Other doctors caring for patient include:    Dr. Jodi Marble ENT Dr. Lindi Adie oncology Dr. Tamala Julian cardiology   Advanced directives: Does Patient Have a Medical Advance Directive?: No Would patient like information on creating a medical advance directive?: Yes (ED - Information included in AVS)  Depression screen:  See questionnaire below.  Depression screen [facility] 2/9 07/20/2021 06/29/2021 07/19/2020 07/09/2019 01/14/2018  Decreased Interest 0 0 0 0 0  Down, Depressed, Hopeless 0 0 0 0 0  PHQ - 2 Score 0 0 0 0 0  Some recent data might be hidden    Fall Risk Screen: see questionnaire below. Fall Risk  07/20/2021 06/29/2021 06/29/2021 07/19/2020 07/09/2019  Falls in the past year? 0 0 0 0 0  Number falls in past yr: 0 0 - - -  Injury with Fall? 0 0 - - -  Risk for fall due to : No Fall Risks Impaired balance/gait;Impaired mobility;Mental status change - - -  Follow up Falls evaluation completed Falls evaluation completed;Education provided;Falls prevention discussed - - -    ADL screen:  See questionnaire below Functional Status Survey: Is the patient deaf or have difficulty hearing?: Yes Does the patient have difficulty seeing, even when wearing glasses/contacts?: No  Does the patient have difficulty concentrating, remembering, or making decisions?: No Does the patient have difficulty walking or climbing stairs?: Yes Does the patient have difficulty dressing or bathing?: Yes   Review of Systems Constitutional: -, -unexpected weight change, -anorexia, -fatigue Allergy: -sneezing, -itching, -congestion Dermatology: denies changing moles, rash, lumps ENT: -runny nose, -ear pain, -sore throat,  Cardiology:  -chest  pain, -palpitations, -orthopnea, Respiratory: -cough, -shortness of breath, -dyspnea on exertion, -wheezing,  Gastroenterology: -abdominal pain, -nausea, -vomiting, -diarrhea, -constipation, -dysphagia Hematology: -bleeding or bruising problems Musculoskeletal: -arthralgias, -myalgias, -joint swelling, -back pain, - Ophthalmology: -vision changes,  Urology: -dysuria, -difficulty urinating,  -urinary frequency, -urgency, incontinence Neurology: -, -numbness, , -memory loss, -falls, -dizziness    PHYSICAL EXAM:    General Appearance: Alert, cooperative, no distress, appears stated age Head: Normocephalic, without obvious abnormality, atraumatic Eyes: PERRL, conjunctiva/corneas clear, EOM's intact, fundi benign Ears: Normal TM's and external ear canals Nose: Nares normal, mucosa normal, no drainage or sinus tenderness Throat: Lips, mucosa, and tongue normal; teeth and gums normal Neck: Supple, no lymphadenopathy;  thyroid:  no enlargement/tenderness/nodules; no carotid bruit or JVD Lungs: Clear to auscultation bilaterally without wheezes, rales or ronchi; respirations unlabored Heart: Regular rate and rhythm, S1 and S2 normal, no murmur, rubor gallop Skin:  Skin color, texture, turgor normal, no rashes or lesions Psych: Normal mood, affect, hygiene and grooming.  ASSESSMENT/PLAN: Routine general medical examination at a health care facility  Hyperlipidemia LDL goal <130 - Plan: Lipid panel  Essential hypertension  Osteoarthritis, unspecified osteoarthritis type, unspecified site  Osteoporosis, unspecified osteoporosis type, unspecified pathological fracture presence - Plan: ibandronate (BONIVA) 150 MG tablet  Gastroesophageal reflux disease without esophagitis  Malignant neoplasm of upper-inner quadrant of left breast in female, estrogen receptor positive ([place])  Failure to thrive in adult - Plan: Ambulatory referral to [place]  Acute saddle pulmonary embolism, unspecified  whether acute cor pulmonale present ([place]) - Plan: Ambulatory referral to [place]  Immunization, viral disease - Plan: Pension scheme manager  Presbycusis of both ears  Aortic atherosclerosis ([place]) - Plan: Lipid panel  SVT (supraventricular tachycardia) (HCC)  History of vitamin D deficiency - Plan: VITAMIN D 25 Hydroxy (Vit-D Deficiency, Fractures)  Thyroid nodule - Plan: US THYROID, TSH I discussed the treatment of osteoporosis with her and will place her on Boniva.  Discussed proper use of this medication.  We will also place her on a statin drug but I will base that on her lipid level.  Encouraged extra vitamin D and calcium.  I will check a calcium level.  She is okay with waiting on any orthopedic involvement at this point.  Did recommend she use Pepcid for her reflux symptoms.  Continue on her mother medications. Home health will be involved with PT and OT with the idea of seeing if they can come in and help and in an effort to try and get her qualified for a rehab unit because of her failure to thrive.  Immunization recommendations discussed.    Medicare Attestation I have personally reviewed: The patient's medical and social history Their use of alcohol, tobacco or illicit drugs Their current medications and supplements The patient's functional ability including ADLs,fall risks, home safety risks, cognitive, and hearing and visual impairment Diet and physical activities Evidence for depression or mood disorders  The patient's weight, height, and BMI have been recorded in the chart.  I have made referrals, counseling, and provided education to the patient based on review of the above and  I have provided the patient with a written personalized care plan for preventive services.     Jill Alexanders, MD   07/20/2021

## 2021-07-21 ENCOUNTER — Other Ambulatory Visit: Payer: Self-pay | Admitting: *Deleted

## 2021-07-21 LAB — LIPID PANEL
Chol/HDL Ratio: 3.9 ratio (ref 0.0–4.4)
Cholesterol, Total: 193 mg/dL (ref 100–199)
HDL: 50 mg/dL (ref >39)
LDL Chol Calc (NIH): 121 mg/dL — ABNORMAL HIGH (ref 0–99)
Triglycerides: 124 mg/dL (ref 0–149)
VLDL Cholesterol Cal: 22 mg/dL (ref 5–40)

## 2021-07-21 LAB — VITAMIN D 25 HYDROXY (VIT D DEFICIENCY, FRACTURES): Vit D, 25-Hydroxy: 141 ng/mL — ABNORMAL HIGH (ref 30.0–100.0)

## 2021-07-21 NOTE — Patient Outreach (Signed)
Care Management Clinical Social Work Note  07/21/2021 Name: Shannon Jensen MRN: [id number] DOB: [birth year]  Shannon Jensen is a 87 y.o. year old female who is a primary care patient of Shannon Lung, MD.  The Care Management team was consulted for assistance with chronic disease management and coordination needs.  Engaged with patient's daughter by telephone for follow-up visit in response to provider referral for social work chronic care management and care coordination services  Consent to Services:  [name] was given information about Care Management services today including:  Care Management services includes personalized support from designated clinical staff supervised by her physician, including individualized plan of care and coordination with other care providers 24/7 contact phone numbers for assistance for urgent and routine care needs. The patient may stop case management services at any time by phone call to the office staff.  Patient agreed to services and consent obtained.   Assessment: Review of patient past medical history, allergies, medications, and health status, including review of relevant consultants reports was performed today as part of a comprehensive evaluation and provision of chronic care management and care coordination services.  SDOH (Social Determinants of Health) assessments and interventions performed:    Advanced Directives Status: Not addressed in this encounter.  Care Plan  Allergies  Allergen Reactions   Celecoxib Other (See Comments)    Unknown   Erythromycin Other (See Comments)    Unknown   Nabumetone Other (See Comments)    Unknown   Nsaids Other (See Comments)    Unknown Unknown    Oxycodone-Acetaminophen Other (See Comments)   Tyloxapol    Vioxx [Rofecoxib] Other (See Comments)    Unknown    Outpatient Encounter Medications as of 07/21/2021  Medication Sig   acetaminophen (TYLENOL) 325 MG tablet Take 650 mg by mouth  every 6 (six) hours as needed for moderate pain or headache.    atorvastatin (LIPITOR) 10 MG tablet Take 1 tablet (10 mg total) by mouth daily. (Patient not taking: No sig reported)   Cholecalciferol (VITAMIN D) 2000 units CAPS Take 2,000 Units by mouth daily.   diclofenac Sodium (VOLTAREN) 1 % GEL Apply 2 g topically daily as needed. (Patient not taking: Reported on 07/20/2021)   Fluocinolone Acetonide 0.01 % OIL fluocinolone acetonide oil 0.01 % ear drops  INSTILL 4 DROPS INTO BOTH EAR(S) FOR 14 DAYS. THEN STOP AND USE AS NEEDED FOR ITCHING. (Patient not taking: Reported on 07/20/2021)   ibandronate (BONIVA) 150 MG tablet Take 1 tablet (150 mg total) by mouth every 30 (thirty) days. Take in the morning with a full glass of water, on an empty stomach, and do not take anything else by mouth or lie down for the next 30 min.   metoprolol tartrate (LOPRESSOR) 25 MG tablet Take 1 tablet (25 mg total) by mouth 2 (two) times daily.   Multiple Vitamins-Minerals (WOMENS ONE DAILY) TABS Take 1 tablet by mouth daily.     Omega-3 Fatty Acids (FISH OIL) 1200 MG CAPS Take 1,200 mg by mouth daily.    rivaroxaban (XARELTO) 20 MG TABS tablet Take 1 tablet (20 mg total) by mouth daily with supper.   RIVAROXABAN (XARELTO) VTE STARTER PACK (15 & 20 MG) Follow package directions: Take one 15mg  tablet by mouth twice a day. On day 22, switch to one 20mg  tablet once a day. Take with food. (Patient not taking: Reported on 07/20/2021)   tamoxifen (NOLVADEX) 10 MG tablet Take 1 tablet (10 mg total) by mouth  daily.   No facility-administered encounter medications on file as of 07/21/2021.    Patient Active Problem List   Diagnosis Date Noted   Chest pain at rest 06/13/2021   SVT (supraventricular tachycardia) ([place])    Acute saddle pulmonary embolism ([place])    Enlarged thyroid 06/23/2019   Genetic testing 10/03/2018   Family history of breast cancer    Family history of pancreatic cancer    Family history of prostate  cancer    Presbycusis of both ears 08/25/2018   Laryngopharyngeal reflux (LPR) 06/10/2018   Malignant neoplasm of upper-inner quadrant of left breast in female, estrogen receptor positive ([place]) 09/24/2017   Internal hemorrhoids 09/23/2014   Family history of colon cancer 06/30/2014   History of colonic polyps 06/30/2014   GERD (gastroesophageal reflux disease) 08/27/2012   Diverticulosis    Osteoporosis    Pelvic relaxation    Cystocele    H/O vitamin D deficiency    Hyperlipidemia LDL goal <130 11/08/2008   Essential hypertension 04/13/2008   Osteoarthritis 04/13/2008    Conditions to be addressed/monitored: HTN and Osteoarthritis.  Limited Social Support, Level of Care Concerns, ADL/IADL Limitations, Limited Access to Caregiver, Memory Deficits, and Lacks Knowledge of [employer].  Care Plan : LCSW Plan of Care  Updates made by Shannon Gaines, LCSW since 07/21/2021 12:00 AM     Problem: Receive Skilled Nursing Placement for [employer].   Priority: High     Goal: Receive Skilled Nursing Placement for [employer].   Start Date: 06/29/2021  Expected End Date: 09/29/2021  This Visit's Progress: On track  Recent Progress: On track  Priority: High  Note:   Current Barriers:  Chronic disease management support and education needs related to Mobility Issues, Fall Risk, Osteoporosis, Osteoarthritis and Hypertension. Inability to perform ADL's/IADL's independently, limited mobility, and now requiring 24 hour care and supervision.   Clinical Goal(s):  Patient and daughter will work with LCSW to identify and address any acute and chronic care coordination needs. Patient and daughter will work with LCSW to pursue skilled nursing facility placement for short-term rehabilitative services, versus home health physical and occupational therapy services.  LCSW Interventions:  Inter-disciplinary care team collaboration (see  longitudinal plan of care). Collaboration with Primary Care Physician, Dr. Jill Jensen to request orders for [place], Physical Therapy and Occupational Therapy Services.   Supportive and Attentive Listening provided, Problem Solving Skills utilized, Task-Centered Solutions identified, and Caregiver Stress acknowledged.   Patient Goals/Self-Care Activities:   Receive weekly/bi-weekly calls from LCSW, in an effort to coordinate home health nursing, physical therapy, and occupational therapy services.    ~ After follow-up appointment with Primary Care Physician, Dr. Jill Jensen, on 07/20/2021, orders were placed to [facility]     [phone]) for nursing, physical therapy and occupational therapy services.    Receive weekly/bi-weekly calls from LCSW, in an effort to coordinate skilled nursing facility placement for short-term rehabilitative services. ~ LCSW collaboration with admissions coordinator at [facility], as well as [facility], both    facilities of interest, to learn that they are not able to make a bed offer without home health physical and occupational therapy notes and     recommendations. LCSW collaboration with [facility], to obtain home health physical and occupational therapy notes and recommendations, to submit to [facility] for authorization and prior approval, for skilled nursing facility placement to receive short-term rehabilitative services. Copy of completed and  signed FL-2 Form mailed to your home, per your request. Contact LCSW directly (# [phone]) if you have questions, need assistance, or if additional social work needs are identified between now and our next scheduled telephone outreach call. Follow-Up Plan:  07/25/2021 at 4:00pm     Nat Christen, BSW, MSW, [place]  Licensed Clinical Social Worker  [place]  Mailing  [place]. [address] [place], [place] [address] Physical Address-[address] [place], [place] [address] Toll Free Main # [phone] Fax # [phone] Cell # [phone]  [email or web address]

## 2021-07-24 LAB — TSH: TSH: 1.2 u[IU]/mL (ref 0.450–4.500)

## 2021-07-24 LAB — SPECIMEN STATUS REPORT

## 2021-07-25 ENCOUNTER — Ambulatory Visit: Payer: Medicare PPO | Admitting: *Deleted

## 2021-07-26 ENCOUNTER — Other Ambulatory Visit: Payer: Self-pay

## 2021-07-26 NOTE — Patient Outreach (Signed)
[facility]) Care Management  07/26/2021  HARMONIE VERRASTRO 02-Nov-1927 [id number]   Telephone call from daughter Mrs. Dimas Millin.  She states that home health was to come today but she has not heard from anyone.  She states she has called Di Kindle and left a message and has not heard back.  Advised that [facility] is the provider and that CM would call to inquire.    Telephone call to [facility] [phone] to inquire.  Spoke with Maggie. She states the start of care date is supposed to be today but not sure what happened.  She apologized and states she will find out and call family to inform of status.    Telephone call back to daughter Mrs. Dimas Millin.  Advised her of above. She voices some frustration of the home health not letting them know.  Apologized for the inconvenience.    Plan: RN CM will outreach as previously scheduled.

## 2021-07-27 ENCOUNTER — Telehealth: Payer: Self-pay

## 2021-07-27 NOTE — Telephone Encounter (Signed)
Shannon Jensen a PT with [facility] called stating they wanted verbal orders for PT 1 week for 2 weeks, 2 week for 4 weeks, and 1 week for 3 weeks. [phone].

## 2021-08-06 NOTE — Progress Notes (Signed)
Cardiology Office Note:    Date:  08/07/2021   ID:  Shannon Jensen, DOB 1927-11-11, MRN [id number]  PCP:  [name], MD  Cardiologist:  None   Referring MD: [name], MD   Chief Complaint  Patient presents with   Irregular Heart Beat   Hypertension     History of Present Illness:    Shannon Jensen is a 87 y.o. female with a hx of recent pulmonary embolism June 14, 9982 complicated by PSVT resolved after beta blocker therapy.   She has multiple questions concerning her heart and whether there is still residual risk.  Her main question is whether the blood clot is dissolved.  Her other question has to do with where did the clot come from?.  She has lost some functionality as she became deconditioned while in the hospital and then refused inpatient rehab to regain functionality.  She is now in the process of getting outpatient therapy.  She desires to become fully active again as she was prior to the PE.  She denies orthopnea, PND, hemoptysis, blood in the urine and stool.  She never felt palpitations while in the hospital even when there was concern about SVT.  Her typical blood pressure medication, amlodipine/olmesartan was discontinued, I presume because of low blood pressure.  She was discharged home on only metoprolol tartrate 25 mg twice daily.  Past Medical History:  Diagnosis Date   Anxiety and depression    Arthritis    Breast cancer ([place]) 08/2008   Right Breast Cancer   Breast cancer ([place]) 2019   Left Breast Cancer   Chronic headaches    Cystocele    Diverticulosis    Family history of breast cancer    Family history of colon cancer    Family history of pancreatic cancer    Family history of prostate cancer    GERD (gastroesophageal reflux disease)    H/O vitamin D deficiency    HLD (hyperlipidemia)    Hypertension    Osteoarthritis    Osteoporosis    Pelvic relaxation    Personal history of radiation therapy 2009   Right Breast Cancer    Urinary tract infection 2014    Past Surgical History:  Procedure Laterality Date   [place]   BREAST LUMPECTOMY Right 2009   BREAST LUMPECTOMY Left 2019   BREAST LUMPECTOMY WITH RADIOACTIVE SEED LOCALIZATION Left 11/19/2017   Procedure: BREAST LUMPECTOMY WITH RADIOACTIVE SEED LOCALIZATION;  Surgeon: Rolm Bookbinder, MD;  Location: [facility];  Service: General;  Laterality: Left;   BREAST SURGERY  2009   cancer-lymph node removal   NECK SURGERY     cyst removal    Current Medications: Current Meds  Medication Sig   acetaminophen (TYLENOL) 325 MG tablet Take 650 mg by mouth every 6 (six) hours as needed for moderate pain or headache.    amLODipine (NORVASC) 5 MG tablet Take 1 tablet (5 mg total) by mouth daily.   Cholecalciferol (VITAMIN D) 2000 units CAPS Take 2,000 Units by mouth daily.   metoprolol tartrate (LOPRESSOR) 25 MG tablet Take 1 tablet (25 mg total) by mouth 2 (two) times daily.   Multiple Vitamins-Minerals (WOMENS ONE DAILY) TABS Take 1 tablet by mouth daily.     Omega-3 Fatty Acids (FISH OIL) 1200 MG CAPS Take 1,200 mg by mouth daily.    rivaroxaban (XARELTO) 20 MG TABS tablet Take 1 tablet (20 mg total) by mouth daily  with supper.   tamoxifen (NOLVADEX) 10 MG tablet Take 1 tablet (10 mg total) by mouth daily.     Allergies:   Celecoxib, Erythromycin, Nabumetone, Nsaids, Oxycodone-acetaminophen, Tyloxapol, and Vioxx [rofecoxib]   Social History   Socioeconomic History   Marital status: Widowed    Spouse name: Not on file   Number of children: 4   Years of education: 45   Highest education level: 12th grade  Occupational History   Occupation: retired    Fish farm manager: RETIRED  Tobacco Use   Smoking status: Never    Passive exposure: Never   Smokeless tobacco: Never  Vaping Use   Vaping Use: Never used  Substance and Sexual Activity   Alcohol use: No   Drug use: No   Sexual activity: Never  Other Topics Concern   Not  on file  Social History Narrative   Not on file   Social Determinants of Health   Financial Resource Strain: Low Risk    Difficulty of Paying Living Expenses: Not hard at all  Food Insecurity: No Food Insecurity   Worried About Charity fundraiser in the Last Year: Never true   [place] in the Last Year: Never true  Transportation Needs: No Transportation Needs   Lack of Transportation (Medical): No   Lack of Transportation (Non-Medical): No  Physical Activity: Inactive   Days of Exercise per Week: 0 days   Minutes of Exercise per Session: 0 min  Stress: No Stress Concern Present   Feeling of Stress : Not at all  Social Connections: Moderately Isolated   Frequency of Communication with Friends and Family: More than three times a week   Frequency of Social Gatherings with Friends and Family: More than three times a week   Attends Religious Services: More than 4 times per year   Active Member of [employer] or Organizations: No   Attends Archivist Meetings: Never   Marital Status: Widowed     Family History: The patient's family history includes Appendicitis in her daughter; Breast cancer in her sister; Colon cancer in her mother; Colon polyps in her daughter; Heart disease in her paternal uncle; Pancreatic cancer in her brother; Pancreatic cancer (age of onset: 20) in her sister; Prostate cancer in her brother and nephew; Prostate cancer (age of onset: 81) in her father; Stroke in her daughter.  ROS:   Please see the history of present illness.    Never had DVT or PE before.  No chest pain.  Able to lie flat.  No lower extremity or upper extremity edema.  All other systems reviewed and are negative.  EKGs/Labs/Other Studies Reviewed:    The following studies were reviewed today:  ECHOCARDIOGRAPHY 06/14/2021 IMPRESSIONS     1. Left ventricular ejection fraction, by estimation, is 65 to 70%. The  left ventricle has normal function. The left ventricle has no  regional  wall motion abnormalities. There is mild left ventricular hypertrophy.  Left ventricular diastolic parameters  are consistent with Grade I diastolic dysfunction (impaired relaxation).   2. Right ventricular systolic function is normal. The right ventricular  size is normal. There is mildly elevated pulmonary artery systolic  pressure.   3. Round, echolucent structure adherent to the RV free wall measuring  about 2 cm in diameter, this likely represents a pericardial cyst or less  likely a loculated pericardial effusion, however could consider cMRI to  further evalute if clinically  warranted.   4. The mitral valve is grossly  normal. Trivial mitral valve  regurgitation.   5. The aortic valve is tricuspid. Aortic valve regurgitation is not  visualized.   6. The inferior vena cava is normal in size with greater than 50%  respiratory variability, suggesting right atrial pressure of 3 mmHg.   Comparison(s): No prior Echocardiogram.   CT performed during Acute PE 06/13/2021:  IMPRESSION: 1. Bilateral pulmonary emboli as detailed. Positive for acute PE with CT evidence of right heart strain (RV/LV Ratio = 1.28) consistent with at least submassive (intermediate risk) PE. The presence of right heart strain has been associated with an increased risk of morbidity and mortality. Please refer to the "PE Focused" order set in EPIC. 2. No other acute abnormality within the chest. 3. Aortic atherosclerosis. 4. Thyroid prominent with evidence of small nodules. Patient has had previous thyroid ultrasound and FNA biopsy, last ultrasound dated 07/09/2019.   Aortic Atherosclerosis (ICD10-I70.0).   EKG:  EKG performed on 06/14/2021, 2 days after being admitted with acute pulmonary embolism revealed right axis deviation and S1Q3T3.  Poor R wave progression was noted.  A repeat has not been done.  Recent Labs: 06/14/2021: ALT 16 06/15/2021: BUN 14; Creatinine, Ser 1.00; Potassium 4.4; Sodium  131 06/16/2021: Hemoglobin 10.3; Platelets 171 07/20/2021: TSH 1.200  Recent Lipid Panel    Component Value Date/Time   CHOL 193 07/20/2021 1524   TRIG 124 07/20/2021 1524   HDL 50 07/20/2021 1524   CHOLHDL 3.9 07/20/2021 1524   CHOLHDL 3.9 08/27/2016 1545   VLDL 20 08/27/2016 1545   LDLCALC 121 (H) 07/20/2021 1524   LDLDIRECT 142.5 11/08/2008 1022    Physical Exam:    VS:  BP (!) 170/74   Pulse 86   Ht 5\' 2"  (1.575 m)   Wt 151 lb 3.2 oz (68.6 kg)   PF 99 L/min   BMI 27.65 kg/m     Wt Readings from Last 3 Encounters:  08/07/21 151 lb 3.2 oz (68.6 kg)  07/20/21 149 lb 9.6 oz (67.9 kg)  07/12/21 144 lb (65.3 kg)     GEN: Appears younger than his stated age. No acute distress HEENT: Normal NECK: No JVD. LYMPHATICS: No lymphadenopathy CARDIAC: Soft left lower sternal systolic murmur. RRR with S4 gallop, but no edema. VASCULAR:  Normal Pulses. No bruits. RESPIRATORY:  Clear to auscultation without rales, wheezing or rhonchi  ABDOMEN: Soft, non-tender, non-distended, No pulsatile mass, MUSCULOSKELETAL: No deformity  SKIN: Warm and dry NEUROLOGIC:  Alert and oriented x 3 PSYCHIATRIC:  Normal affect   ASSESSMENT:    1. SVT (supraventricular tachycardia) ([place])   2. Acute saddle pulmonary embolism without acute cor pulmonale (HCC)   3. Hyperlipidemia LDL goal <130   4. Essential hypertension   5. Chronic anticoagulation   6. Aortic atherosclerosis (HCC)    PLAN:    In order of problems listed above:  Needs long term monitor, 2 weeks to rule out A. fib/recurrent SVT.  May discontinue beta-blocker at that time. There is no evidence of acute or chronic right heart failure suggesting that PE is resolving We did not discuss hyperlipidemia Blood pressure is too high.  Add amlodipine 5 mg/day.  Monitor blood pressure at home.  May have to resume olmesartan.  She is desirous of resuming [place] at some point.  Beta-blocker therapy may be discontinued at some point in the  future.  Plan clinical follow-up in 2 months.  Echo prior to the office visit.  2-week monitor.  Monitor blood pressure at home.  Notify  us with some data.  We want to get systolic pressure less than 150.  It is 170 mmHg today.  Amlodipine is being added to metoprolol tartrate.   Medication Adjustments/Labs and Tests Ordered: Current medicines are reviewed at length with the patient today.  Concerns regarding medicines are outlined above.  Orders Placed This Encounter  Procedures   LONG TERM MONITOR (3-14 DAYS)   ECHOCARDIOGRAM COMPLETE    Meds ordered this encounter  Medications   amLODipine (NORVASC) 5 MG tablet    Sig: Take 1 tablet (5 mg total) by mouth daily.    Dispense:  90 tablet    Refill:  3     Patient Instructions  Medication Instructions:  1) START Amlodipine 5mg  once daily.  Monitor your blood pressure 2-3 times per week and bring those readings with you to your next appointment.  Call if you are seeing your top number stay above 150.  *If you need a refill on your cardiac medications before your next appointment, please call your pharmacy*   Lab Work: None If you have labs (blood work) drawn today and your tests are completely normal, you will receive your results only by: [place] (if you have MyChart) OR A paper copy in the mail If you have any lab test that is abnormal or we need to change your treatment, we will call you to review the results.   Testing/Procedures: Your physician has requested that you have an echocardiogram 1-2 days prior to seeing Dr. Tamala Julian back in 2 months. Echocardiography is a painless test that uses sound waves to create images of your heart. It provides your doctor with information about the size and shape of your heart and how well your heart's chambers and valves are working. This procedure takes approximately one hour. There are no restrictions for this procedure.  Your physician recommends that you wear a monitor for 2  weeks.   Follow-Up: At [facility], you and your health needs are our priority.  As part of our continuing mission to provide you with exceptional heart care, we have created designated Provider Care Teams.  These Care Teams include your primary Cardiologist (physician) and Advanced Practice Providers (APPs -  Physician Assistants and Nurse Practitioners) who all work together to provide you with the care you need, when you need it.  We recommend signing up for the patient portal called "MyChart".  Sign up information is provided on this After Visit Summary.  MyChart is used to connect with patients for Virtual Visits (Telemedicine).  Patients are able to view lab/test results, encounter notes, upcoming appointments, etc.  Non-urgent messages can be sent to your provider as well.   To learn more about what you can do with MyChart, go to [email or web address].    Your next appointment:   2 month(s)- can have   The format for your next appointment:   In Person  Provider:   Daneen Schick, III, MD    Other Instructions  [place] Monitor Instructions  Your physician has requested you wear a ZIO patch monitor for 14 days.  This is a single patch monitor. [employer] supplies one patch monitor per enrollment. Additional stickers are not available. Please do not apply patch if you will be having a Nuclear Stress Test,  Echocardiogram, Cardiac CT, MRI, or Chest Xray during the period you would be wearing the  monitor. The patch cannot be worn during these tests. You cannot remove and re-apply the  ZIO XT patch  monitor.  Your ZIO patch monitor will be mailed 3 day USPS to your address on file. It may take 3-5 days  to receive your monitor after you have been enrolled.  Once you have received your monitor, please review the enclosed instructions. Your monitor  has already been registered assigning a specific monitor serial # to you.  Billing and Patient Assistance Program  Information  We have supplied [employer] with any of your insurance information on file for billing purposes. [employer] offers a sliding scale Patient Assistance Program for patients that do not have  insurance, or whose insurance does not completely cover the cost of the ZIO monitor.  You must apply for the Patient Assistance Program to qualify for this discounted rate.  To apply, please call [employer] at [phone], select option 4, select option 2, ask to apply for  Patient Assistance Program. Theodore Demark will ask your household income, and how many people  are in your household. They will quote your out-of-pocket cost based on that information.  [employer] will also be able to set up a 28-month, interest-free payment plan if needed.  Applying the monitor   Shave hair from upper left chest.  Hold abrader disc by orange tab. Rub abrader in 40 strokes over the upper left chest as  indicated in your monitor instructions.  Clean area with 4 enclosed alcohol pads. Let dry.  Apply patch as indicated in monitor instructions. Patch will be placed under collarbone on left  side of chest with arrow pointing upward.  Rub patch adhesive wings for 2 minutes. Remove white label marked "1". Remove the white  label marked "2". Rub patch adhesive wings for 2 additional minutes.  While looking in a mirror, press and release button in center of patch. A small green light will  flash 3-4 times. This will be your only indicator that the monitor has been turned on.  Do not shower for the first 24 hours. You may shower after the first 24 hours.  Press the button if you feel a symptom. You will hear a small click. Record Date, Time and  Symptom in the Patient Logbook.  When you are ready to remove the patch, follow instructions on the last 2 pages of Patient  Logbook. Stick patch monitor onto the last page of Patient Logbook.  Place Patient Logbook in the blue and white box. Use locking tab on box and tape box closed   securely. The blue and white box has prepaid postage on it. Please place it in the mailbox as  soon as possible. Your physician should have your test results approximately 7 days after the  monitor has been mailed back to [facility].  Call [place] at [phone] if you have questions regarding  your ZIO XT patch monitor. Call them immediately if you see an orange light blinking on your  monitor.  If your monitor falls off in less than 4 days, contact our Monitor department at [phone].  If your monitor becomes loose or falls off after 4 days call [employer] at [phone] for  suggestions on securing your monitor   Signed, Sinclair Grooms, MD  08/07/2021 3:28 PM    [place]

## 2021-08-07 ENCOUNTER — Ambulatory Visit: Payer: Medicare PPO | Admitting: Interventional Cardiology

## 2021-08-07 ENCOUNTER — Encounter: Payer: Self-pay | Admitting: Interventional Cardiology

## 2021-08-07 ENCOUNTER — Other Ambulatory Visit: Payer: Self-pay

## 2021-08-07 ENCOUNTER — Ambulatory Visit (INDEPENDENT_AMBULATORY_CARE_PROVIDER_SITE_OTHER): Payer: Medicare PPO

## 2021-08-07 VITALS — BP 170/74 | HR 86 | Ht 62.0 in | Wt 151.2 lb

## 2021-08-07 DIAGNOSIS — I2692 Saddle embolus of pulmonary artery without acute cor pulmonale: Secondary | ICD-10-CM | POA: Diagnosis not present

## 2021-08-07 DIAGNOSIS — Z7901 Long term (current) use of anticoagulants: Secondary | ICD-10-CM | POA: Diagnosis not present

## 2021-08-07 DIAGNOSIS — I10 Essential (primary) hypertension: Secondary | ICD-10-CM

## 2021-08-07 DIAGNOSIS — I471 Supraventricular tachycardia: Secondary | ICD-10-CM

## 2021-08-07 DIAGNOSIS — E785 Hyperlipidemia, unspecified: Secondary | ICD-10-CM

## 2021-08-07 DIAGNOSIS — I700 Atherosclerosis of aorta: Secondary | ICD-10-CM

## 2021-08-07 MED ORDER — AMLODIPINE BESYLATE 5 MG PO TABS
5.0000 mg | ORAL_TABLET | Freq: Every day | ORAL | 3 refills | Status: DC
Start: 2021-08-07 — End: 2022-07-26

## 2021-08-07 NOTE — Patient Outreach (Signed)
[facility]) Care Management  08/07/2021  Shannon Jensen 04-13-1928 [id number]   Telephone call to daughter [name] for  follow up.   No answer.  HIPAA compliant voice message left.    Plan: If no return call, RN CM will attempt patient again within 4 business days.    Jone Baseman, RN, MSN [place] Management Care Management Coordinator Direct Line [phone] Cell [phone] Toll Free: [phone]  Fax: [phone]

## 2021-08-07 NOTE — Progress Notes (Unsigned)
Enrolled for [employer] to mail a ZIO XT long term holter monitor to the patients address on file.  

## 2021-08-07 NOTE — Patient Instructions (Addendum)
Medication Instructions:  1) START Amlodipine 5mg  once daily.  Monitor your blood pressure 2-3 times per week and bring those readings with you to your next appointment.  Call if you are seeing your top number stay above 150.  *If you need a refill on your cardiac medications before your next appointment, please call your pharmacy*   Lab Work: None If you have labs (blood work) drawn today and your tests are completely normal, you will receive your results only by: [place] (if you have MyChart) OR A paper copy in the mail If you have any lab test that is abnormal or we need to change your treatment, we will call you to review the results.   Testing/Procedures: Your physician has requested that you have an echocardiogram 1-2 days prior to seeing Dr. Tamala Julian back in 2 months. Echocardiography is a painless test that uses sound waves to create images of your heart. It provides your doctor with information about the size and shape of your heart and how well your heart's chambers and valves are working. This procedure takes approximately one hour. There are no restrictions for this procedure.  Your physician recommends that you wear a monitor for 2 weeks.   Follow-Up: At [facility], you and your health needs are our priority.  As part of our continuing mission to provide you with exceptional heart care, we have created designated Provider Care Teams.  These Care Teams include your primary Cardiologist (physician) and Advanced Practice Providers (APPs -  Physician Assistants and Nurse Practitioners) who all work together to provide you with the care you need, when you need it.  We recommend signing up for the patient portal called "MyChart".  Sign up information is provided on this After Visit Summary.  MyChart is used to connect with patients for Virtual Visits (Telemedicine).  Patients are able to view lab/test results, encounter notes, upcoming appointments, etc.  Non-urgent messages  can be sent to your provider as well.   To learn more about what you can do with MyChart, go to [email or web address].    Your next appointment:   2 month(s)- can have 10/23/21 at 11:40am or 3:40pm  The format for your next appointment:   In Person  Provider:   Daneen Schick, III, MD    Other Instructions  [place] Monitor Instructions  Your physician has requested you wear a ZIO patch monitor for 14 days.  This is a single patch monitor. [employer] supplies one patch monitor per enrollment. Additional stickers are not available. Please do not apply patch if you will be having a Nuclear Stress Test,  Echocardiogram, Cardiac CT, MRI, or Chest Xray during the period you would be wearing the  monitor. The patch cannot be worn during these tests. You cannot remove and re-apply the  ZIO XT patch monitor.  Your ZIO patch monitor will be mailed 3 day USPS to your address on file. It may take 3-5 days  to receive your monitor after you have been enrolled.  Once you have received your monitor, please review the enclosed instructions. Your monitor  has already been registered assigning a specific monitor serial # to you.  Billing and Patient Assistance Program Information  We have supplied [employer] with any of your insurance information on file for billing purposes. [employer] offers a sliding scale Patient Assistance Program for patients that do not have  insurance, or whose insurance does not completely cover the cost of the ZIO monitor.  You must apply  for the Patient Assistance Program to qualify for this discounted rate.  To apply, please call [employer] at [phone], select option 4, select option 2, ask to apply for  Patient Assistance Program. Theodore Demark will ask your household income, and how many people  are in your household. They will quote your out-of-pocket cost based on that information.  [employer] will also be able to set up a 41-month, interest-free payment plan if  needed.  Applying the monitor   Shave hair from upper left chest.  Hold abrader disc by orange tab. Rub abrader in 40 strokes over the upper left chest as  indicated in your monitor instructions.  Clean area with 4 enclosed alcohol pads. Let dry.  Apply patch as indicated in monitor instructions. Patch will be placed under collarbone on left  side of chest with arrow pointing upward.  Rub patch adhesive wings for 2 minutes. Remove white label marked "1". Remove the white  label marked "2". Rub patch adhesive wings for 2 additional minutes.  While looking in a mirror, press and release button in center of patch. A small green light will  flash 3-4 times. This will be your only indicator that the monitor has been turned on.  Do not shower for the first 24 hours. You may shower after the first 24 hours.  Press the button if you feel a symptom. You will hear a small click. Record Date, Time and  Symptom in the Patient Logbook.  When you are ready to remove the patch, follow instructions on the last 2 pages of Patient  Logbook. Stick patch monitor onto the last page of Patient Logbook.  Place Patient Logbook in the blue and white box. Use locking tab on box and tape box closed  securely. The blue and white box has prepaid postage on it. Please place it in the mailbox as  soon as possible. Your physician should have your test results approximately 7 days after the  monitor has been mailed back to [facility].  Call [place] at [phone] if you have questions regarding  your ZIO XT patch monitor. Call them immediately if you see an orange light blinking on your  monitor.  If your monitor falls off in less than 4 days, contact our Monitor department at [phone].  If your monitor becomes loose or falls off after 4 days call [employer] at [phone] for  suggestions on securing your monitor

## 2021-08-08 ENCOUNTER — Other Ambulatory Visit: Payer: Self-pay | Admitting: *Deleted

## 2021-08-08 ENCOUNTER — Ambulatory Visit: Payer: Self-pay

## 2021-08-09 ENCOUNTER — Other Ambulatory Visit: Payer: Self-pay

## 2021-08-09 NOTE — Patient Outreach (Signed)
Care Management Clinical Social Work Note  08/09/2021 Name: Shannon Jensen MRN: [id number] DOB: May 24, 1928  Shannon Jensen is a 87 y.o. year old female who is a primary care patient of [name], MD.  The Care Management team was consulted for assistance with chronic disease management and coordination needs.  Engaged with patient's daughter and son by telephone for follow up visit in response to provider referral for social work chronic care management and care coordination services  Consent to Services:  Ms. Dearing was given information about Care Management services today including:  Care Management services includes personalized support from designated clinical staff supervised by her physician, including individualized plan of care and coordination with other care providers 24/7 contact phone numbers for assistance for urgent and routine care needs. The patient may stop case management services at any time by phone call to the office staff.  Patient agreed to services and consent obtained.   Assessment: Review of patient past medical history, allergies, medications, and health status, including review of relevant consultants reports was performed today as part of a comprehensive evaluation and provision of chronic care management and care coordination services.  SDOH (Social Determinants of Health) assessments and interventions performed:    Advanced Directives Status: Not addressed in this encounter.  Care Plan  Allergies  Allergen Reactions   Celecoxib Other (See Comments)    Unknown   Erythromycin Other (See Comments)    Unknown   Nabumetone Other (See Comments)    Unknown   Nsaids Other (See Comments)    Unknown Unknown    Oxycodone-Acetaminophen Other (See Comments)   Tyloxapol    Vioxx [Rofecoxib] Other (See Comments)    Unknown    Outpatient Encounter Medications as of 08/08/2021  Medication Sig   acetaminophen (TYLENOL) 325 MG tablet Take 650 mg by  mouth every 6 (six) hours as needed for moderate pain or headache.    amLODipine (NORVASC) 5 MG tablet Take 1 tablet (5 mg total) by mouth daily.   atorvastatin (LIPITOR) 10 MG tablet Take 1 tablet (10 mg total) by mouth daily. (Patient not taking: Reported on 08/07/2021)   Cholecalciferol (VITAMIN D) 2000 units CAPS Take 2,000 Units by mouth daily.   diclofenac Sodium (VOLTAREN) 1 % GEL Apply 2 g topically daily as needed. (Patient not taking: Reported on 08/07/2021)   Fluocinolone Acetonide 0.01 % OIL  (Patient not taking: Reported on 08/07/2021)   ibandronate (BONIVA) 150 MG tablet Take 1 tablet (150 mg total) by mouth every 30 (thirty) days. Take in the morning with a full glass of water, on an empty stomach, and do not take anything else by mouth or lie down for the next 30 min. (Patient not taking: Reported on 08/07/2021)   metoprolol tartrate (LOPRESSOR) 25 MG tablet Take 1 tablet (25 mg total) by mouth 2 (two) times daily.   Multiple Vitamins-Minerals (WOMENS ONE DAILY) TABS Take 1 tablet by mouth daily.     Omega-3 Fatty Acids (FISH OIL) 1200 MG CAPS Take 1,200 mg by mouth daily.    rivaroxaban (XARELTO) 20 MG TABS tablet Take 1 tablet (20 mg total) by mouth daily with supper.   RIVAROXABAN (XARELTO) VTE STARTER PACK (15 & 20 MG) Follow package directions: Take one 15mg  tablet by mouth twice a day. On day 22, switch to one 20mg  tablet once a day. Take with food. (Patient not taking: Reported on 08/07/2021)   tamoxifen (NOLVADEX) 10 MG tablet Take 1 tablet (10 mg total) by  mouth daily.   No facility-administered encounter medications on file as of 08/08/2021.    Patient Active Problem List   Diagnosis Date Noted   Chest pain at rest 06/13/2021   SVT (supraventricular tachycardia) ([place])    Acute saddle pulmonary embolism ([place])    Enlarged thyroid 06/23/2019   Genetic testing 10/03/2018   Family history of breast cancer    Family history of pancreatic cancer    Family history of  prostate cancer    Presbycusis of both ears 08/25/2018   Laryngopharyngeal reflux (LPR) 06/10/2018   Malignant neoplasm of upper-inner quadrant of left breast in female, estrogen receptor positive ([place]) 09/24/2017   Internal hemorrhoids 09/23/2014   Family history of colon cancer 06/30/2014   History of colonic polyps 06/30/2014   GERD (gastroesophageal reflux disease) 08/27/2012   Diverticulosis    Osteoporosis    Pelvic relaxation    Cystocele    H/O vitamin D deficiency    Hyperlipidemia LDL goal <130 11/08/2008   Essential hypertension 04/13/2008   Osteoarthritis 04/13/2008    Conditions to be addressed/monitored: HTN, Osteoporosis, and Osteoarthritis.  Level of Care Concerns, ADL/IADL Limitations, Limited Access to Caregiver, and Lacks Knowledge of [employer].  Care Plan : LCSW Plan of Care  Updates made by Francis Gaines, LCSW since 08/09/2021 12:00 AM     Problem: Receive Skilled Nursing Placement for [employer].   Priority: High     Goal: Receive Skilled Nursing Placement for [employer].   Start Date: 06/29/2021  Expected End Date: 09/29/2021  This Visit's Progress: On track  Recent Progress: On track  Priority: High  Note:   Current Barriers:  Chronic disease management support and education needs related to Mobility Issues, Fall Risk, Osteoporosis, Osteoarthritis and Hypertension. Inability to perform ADL's/IADL's independently, limited mobility, and now requiring 24 hour care and supervision.   Clinical Goal(s):  Patient and daughter will work with LCSW to identify and address any acute and chronic care coordination needs. Patient and daughter will work with LCSW to pursue skilled nursing facility placement for short-term rehabilitative services, versus home health physical and occupational therapy services.  LCSW Interventions:  Inter-disciplinary care team collaboration (see longitudinal plan of  care). Collaboration with Primary Care Physician, Dr. Jill Alexanders to request orders for [facility], Physical Therapy and Occupational Therapy Services.   Supportive and Attentive Listening provided, Problem Solving Skills utilized, Task-Centered Solutions identified, and Caregiver Stress acknowledged.   Patient Goals/Self-Care Activities:   Receive bi-weekly calls from LCSW, in an effort to coordinate short-term skilled nursing facility placement for rehabilitative services.   Continue to receive home health nursing, physical therapy, and occupational therapy services, through [facility] (# [phone]).    LCSW collaboration with [facility], to request that all physical and occupational therapy notes be faxed to the admissions coordinator at [facility] (# [phone]), for review and appropriateness of admission, providing contact information. LCSW collaboration with admissions coordinator at [facility], to confirm receipt of home health physical and occupational therapy notes, faxed by home health physical and occupational therapists, with [facility].   Contact LCSW directly (# [id number]) if you have questions, need assistance, or if additional social work needs are identified between now and our next scheduled telephone outreach call. Follow-Up Plan:  08/29/2021 at 9:00 am     Nat Christen, BSW, MSW, [place]  Licensed Clinical Social Worker  Day  Mailing Address-[address], [place], [place] [address] Physical Address-[address], [place], [place] [address] Toll Free Main # [phone] Fax # [phone] Cell # [phone]  [email or web address]

## 2021-08-09 NOTE — Patient Instructions (Signed)
Patient Goals/Self-Care Activities: Take all medications as prescribed check blood pressure daily write blood pressure results in a log or diary learn about high blood pressure 08/09/21 Daughter reports last blood pressure reading 150/82.  She reports blood pressure being taken three times a day right now.  Discussed low salt diet.

## 2021-08-09 NOTE — Patient Outreach (Signed)
[facility]) Care Management  Shannon Jensen  08/09/2021   Shannon Jensen 1928/02/22 [id number]  Subjective: Spoke with daughter Shannon Jensen. Patient doing ok.  They are still in the process of getting patient to skilled facility.  OT and PT are coming to the home.  Once all evaluations are completed and sent to facility, the facility will proceed with process of placement.  Family working with [employer] on placement.  Patient blood pressures had been elevated. Daughter states they are checking 3 times a day now. Encouraged to check and record for physician.  Discussed low salt diet as well.  No questions at this time.    Objective:   Encounter Medications:  Outpatient Encounter Medications as of 08/09/2021  Medication Sig   acetaminophen (TYLENOL) 325 MG tablet Take 650 mg by mouth every 6 (six) hours as needed for moderate pain or headache.    amLODipine (NORVASC) 5 MG tablet Take 1 tablet (5 mg total) by mouth daily.   Cholecalciferol (VITAMIN D) 2000 units CAPS Take 2,000 Units by mouth daily.   metoprolol tartrate (LOPRESSOR) 25 MG tablet Take 1 tablet (25 mg total) by mouth 2 (two) times daily.   Multiple Vitamins-Minerals (WOMENS ONE DAILY) TABS Take 1 tablet by mouth daily.     Omega-3 Fatty Acids (FISH OIL) 1200 MG CAPS Take 1,200 mg by mouth daily.    rivaroxaban (XARELTO) 20 MG TABS tablet Take 1 tablet (20 mg total) by mouth daily with supper.   tamoxifen (NOLVADEX) 10 MG tablet Take 1 tablet (10 mg total) by mouth daily.   atorvastatin (LIPITOR) 10 MG tablet Take 1 tablet (10 mg total) by mouth daily. (Patient not taking: Reported on 08/07/2021)   diclofenac Sodium (VOLTAREN) 1 % GEL Apply 2 g topically daily as needed. (Patient not taking: Reported on 08/07/2021)   Fluocinolone Acetonide 0.01 % OIL  (Patient not taking: Reported on 08/07/2021)   ibandronate (BONIVA) 150 MG tablet Take 1 tablet (150 mg total) by mouth every 30 (thirty) days. Take in the  morning with a full glass of water, on an empty stomach, and do not take anything else by mouth or lie down for the next 30 min. (Patient not taking: Reported on 08/07/2021)   RIVAROXABAN (XARELTO) VTE STARTER PACK (15 & 20 MG) Follow package directions: Take one 22m tablet by mouth twice a day. On day 22, switch to one 253mtablet once a day. Take with food. (Patient not taking: Reported on 08/07/2021)   No facility-administered encounter medications on file as of 08/09/2021.    Functional Status:  In your present state of health, do you have any difficulty performing the following activities: 08/09/2021 07/20/2021  Hearing? Y Y  Comment HOH-working to get heearing aids -  Vision? N N  Difficulty concentrating or making decisions? N N  Walking or climbing stairs? Y Y  Comment some shortness of breath -  Dressing or bathing? Y Tempie DonningComment daughter helps- getting in and out of tub, washing feet and back, and putting on clothes -  Doing errands, shopping? Y -  Comment daughter helps-does shopping and taking to appointments. -  Preparing Food and eating ? Y Tempie DonningComment daughter prepares food -  Using the Toilet? Y Y  Comment needs assist walking-guarding -  In the past six months, have you accidently leaked urine? Y Y  Comment some incontienence at times -  Do you have problems with loss of bowel control? - N  Managing your Medications? [name]  Comment daughter helps with medication -  Managing your Finances? [name]  Comment daughter assists -  Housekeeping or managing your Housekeeping? [name]  Comment daughter does housekeeping -  Some recent data might be hidden    Fall/Depression Screening: Fall Risk  08/09/2021 07/20/2021 06/29/2021  Falls in the past year? 0 0 0  Number falls in past yr: - 0 0  Injury with Fall? - 0 0  Risk for fall due to : - No Fall Risks Impaired balance/gait;Impaired mobility;Mental status change  Follow up - Falls evaluation completed Falls evaluation  completed;Education provided;Falls prevention discussed   PHQ 2/9 Scores 08/09/2021 07/20/2021 06/29/2021 06/29/2021 07/19/2020 07/09/2019 01/14/2018  PHQ - 2 Score - 0 0 - 0 0 0  Exception Documentation Other- indicate reason in comment box - Other- indicate reason in comment box Other- indicate reason in comment box - - -  Not completed daughter Shannon Jensen completes assessment - Verified by Daugher - Shannon Jensen daughter Shannon Jensen completed assessment - - -    Assessment:   Care Plan Care Plan : General Plan of Care (Adult)  Updates made by [name], RN since 08/09/2021 12:00 AM  Completed 08/09/2021   Problem: Health Promotion or Disease Self-Management (General Plan of Care) Resolved 08/09/2021     Goal: Self-Management Plan Developed as evidenced by family following up on any resources given. Completed 08/09/2021  Start Date: 06/29/2021  Expected End Date: 08/09/2021  Recent Progress: On track  Priority: High  Note:   Evidence-based guidance:  Review biopsychosocial determinants of health screens.  Determine level of modifiable health risk.  Assess level of patient activation, level of readiness, importance and confidence to make changes.  Evoke change talk using open-ended questions, pros and cons, as well as looking forward.  Identify areas where behavior change may lead to improved health.  Partner with patient to develop a robust self-management plan that includes lifestyle factors, such as weight loss, exercise and healthy nutrition, as well as goals specific to disease risks.  Support patient and family/caregiver active participation in decision-making and self-management plan.  Implement additional goals and interventions based on identified risk factors to reduce health risk.  Facilitate advance care planning.  Review need for preventive screening based on age, sex, family history and health history.   Notes: 26/71/24 Resolving duplicate goal    Task: Mutually  Develop and [name] Achievement of Patient Goals Completed 08/09/2021  Note:   Care Management Activities:    - collaboration with team encouraged - questions answered - reassurance provided    Notes: 06/29/21 Patient with recent hospitalization. Patient recommended for rehab but patient declined due to explanation given.  Family and patient agreeable to rehab at this time and need some assistance with placement.  CM did give information of local rehabs to check out.   07/06/21 Spoke with daughter Shannon Jensen.  She reports some frustration with getting her mother in a facility.  She reports that MD office has not gotten back with her.  She states that they are considering home health at this point as patient needs some therapy. However, daughter states they would prefer her go to a facility.   07/17/21 Spoke with daughter still waiting on placement.  FL-2 has been sent and waiting response.      Problem: [facility] (General Plan of Care) Resolved 08/09/2021     Care Plan : RN Care Manager Plan of Care  Updates made by [name], RN  since 08/09/2021 12:00 AM     Problem: Chronic Disease Management and Care Coordination Needs of HTN   Priority: High     Goal: Patient will receive placement in rehab facility   Start Date: 08/09/2021  Expected End Date: 11/07/2021  Priority: High  Note:   Current Barriers:  Care Coordination needs related to Skilled placement  RNCM Clinical Goal(s):  Patient will work with Education officer, museum to address  related to the management of Placement to skilled facility related to the management of deconditioning related to recent hospitalization as evidenced by review of EMR and patient or social worker report through collaboration with Consulting civil engineer, provider, and care team.   Interventions: Discussed progress with placement with daughter. Facility waiting for complete evaluations from home PT and OT Inter-disciplinary care team collaboration (see  longitudinal plan of care) Evaluation of current treatment plan related to  self management and patient's adherence to plan as established by provider    Recent hospitalization with deconditioning (Status:  New goal.)  Short Term Goal Evaluation of current treatment plan related to  Deconditioning ,  self-management and patient's adherence to plan as established by provider. Discussed plans with patient for ongoing care management follow up and provided patient with direct contact information for care management team Social Work referral for Placement.  Currently working with LCSW on placement.  Patient Goals/Self-Care Activities: Work with the Education officer, museum to address care coordination needs and will continue to work with the clinical team to address health care and disease management related needs  Follow Up Plan:  Telephone follow up appointment with care management team member scheduled for:  December     Long-Range Goal: Establish Plan of Care for Chronic Disease Management HTN   Start Date: 08/09/2021  Expected End Date: 08/09/2022  Priority: High  Note:   Current Barriers:  Knowledge Deficits related to plan of care for management of HTN   RNCM Clinical Goal(s):  Patient will verbalize understanding of plan for management of HTN as evidenced by blood pressure less than 140/80 continue to work with RN Care Manager to address care management and care coordination needs related to  HTN as evidenced by adherence to CM Team Scheduled appointments through collaboration with RN Care manager, provider, and care team.   Interventions: Education and support related to Hypertension Inter-disciplinary care team collaboration (see longitudinal plan of care) Evaluation of current treatment plan related to  self management and patient's adherence to plan as established by provider   Hypertension Interventions:  (Status:  New goal.) Long Term Goal Last practice recorded BP readings:  BP  Readings from Last 3 Encounters:  08/07/21 (!) 170/74  07/20/21 (!) 148/70  07/12/21 122/68  Most recent eGFR/CrCl:  Lab Results  Component Value Date   EGFR 43 (L) 06/12/2021    No components found for: CRCL  Evaluation of current treatment plan related to hypertension self management and patient's adherence to plan as established by provider Provided education to patient re: stroke prevention, s/s of heart attack and stroke Discussed plans with patient for ongoing care management follow up and provided patient with direct contact information for care management team Advised patient, providing education and rationale, to monitor blood pressure daily and record, calling PCP for findings outside established parameters  Patient Goals/Self-Care Activities: Take all medications as prescribed check blood pressure daily write blood pressure results in a log or diary learn about high blood pressure 08/09/21 Daughter reports last blood pressure reading 150/82.  She  reports blood pressure being taken three times a day right now.  Discussed low salt diet.   Follow Up Plan:  Telephone follow up appointment with care management team member scheduled for:  December. The patient has been provided with contact information for the care management team and has been advised to call with any health related questions or concerns.        Goals Addressed               This Visit's Progress     COMPLETED: Find Help in My Community (pt-stated)   On track     Barriers: Knowledge Timeframe:  Short-Term Goal Priority:  High Start Date:     06/29/21                        Expected End Date:      08/09/21                 Follow Up Date 08/09/21   - follow-up on any referrals for help I am given - think ahead to make sure my need does not become an emergency - have a back-up plan    Why is this important?   Knowing how and where to find help for yourself or family in your neighborhood and  community is an important skill.  You will want to take some steps to learn how.    Notes: 06/29/21 Patient with recent hospitalization. Patient was recommended rehab but patient declined as she did not understand. Patient and family now wanting rehab placement. Care Manager did give family names of area rehab centers to follow up on.   07/06/21 Telephone call to Dr. Lanice Shirts office for request for home health services. Family engaged with LCSW, [employer].  07/17/21 Patient continues to wait placement.  FL-2 sent.  94/07/68 Resolving duplicate goal      COMPLETED: [facility] My Quality of Life   On track     Barriers: None Timeframe:  Short-Term Goal Priority:  High Start Date:   06/29/21                          Expected End Date:    08/09/21                   Follow Up Date 08/09/21 - check out options for in-home help, long-term care or hospice    Why is this important?   Having a long-term illness can be scary.  It can also be stressful for you and your caregiver.  These steps may help.    Notes: 06/29/21 Patient recent hospitalization and declined rehab.  Family and patient trying to seek placement now. Patient also declined home health.  Resources given  for long term care facilities local.  Social work referral done.   07/06/21 Daughter providing 24/7 care for patient.  Family seeking short term rehab for strengthening.   07/17/21 FL-2 sent to facilities. Waiting on reply from facilities.  08/81/10 Resolving duplicate goal        Plan: RN CM will send update to physician Follow-up: Patient agrees to Care Plan and Follow-up. Follow-up in 1 month(s)  Jone Baseman, RN, MSN [place] [facility] Care Management Coordinator Direct Line [phone] Cell [phone] Toll Free: [phone]  Fax: [phone]

## 2021-08-10 ENCOUNTER — Ambulatory Visit
Admission: RE | Admit: 2021-08-10 | Discharge: 2021-08-10 | Disposition: A | Discharge status: Home / Self Care | Payer: Medicare PPO | Source: Ambulatory Visit | Attending: Family Medicine | Admitting: Family Medicine

## 2021-08-10 DIAGNOSIS — E042 Nontoxic multinodular goiter: Secondary | ICD-10-CM | POA: Diagnosis not present

## 2021-08-11 ENCOUNTER — Ambulatory Visit: Payer: Self-pay

## 2021-08-14 ENCOUNTER — Encounter: Payer: Self-pay | Admitting: Family Medicine

## 2021-08-14 DIAGNOSIS — I471 Supraventricular tachycardia: Secondary | ICD-10-CM

## 2021-08-15 ENCOUNTER — Other Ambulatory Visit: Payer: Self-pay

## 2021-08-15 DIAGNOSIS — M1990 Unspecified osteoarthritis, unspecified site: Secondary | ICD-10-CM

## 2021-08-23 ENCOUNTER — Other Ambulatory Visit: Payer: Self-pay

## 2021-08-23 NOTE — Patient Outreach (Addendum)
[facility]) Care Management  08/23/2021  ROSEMARIA INABINET 26-Jul-1928 [id number]   Telephone call to patient for disease management follow up.   No answer.  HIPAA compliant voice message left.    Plan: If no return call, RN CM will attempt patient again in the month of February.  2:18 pm Update: return call to daughter.  No answer.  HIPAA compliant voice message left. RN CM will wait return call.  If no return call will outreach at next scheduled time.    Jone Baseman, RN, MSN [place] Management Care Management Coordinator Direct Line [phone] Cell [phone] Toll Free: [phone]  Fax: [phone]

## 2021-08-28 NOTE — Therapy (Deleted)
OUTPATIENT PHYSICAL THERAPY CERVICAL EVALUATION   Patient Name: Shannon Jensen MRN: [id number] DOB:Jan 06, 1928, 87 y.o., female Today's Date: 08/28/2021    Past Medical History:  Diagnosis Date   Anxiety and depression    Arthritis    Breast cancer ([place]) 08/2008   Right Breast Cancer   Breast cancer ([place]) 2019   Left Breast Cancer   Chronic headaches    Cystocele    Diverticulosis    Family history of breast cancer    Family history of colon cancer    Family history of pancreatic cancer    Family history of prostate cancer    GERD (gastroesophageal reflux disease)    H/O vitamin D deficiency    HLD (hyperlipidemia)    Hypertension    Osteoarthritis    Osteoporosis    Pelvic relaxation    Personal history of radiation therapy 2009   Right Breast Cancer   Urinary tract infection 2014   Past Surgical History:  Procedure Laterality Date   ABDOMINAL HYSTERECTOMY  1998   APPENDECTOMY  1970   BREAST LUMPECTOMY Right 2009   BREAST LUMPECTOMY Left 2019   BREAST LUMPECTOMY WITH RADIOACTIVE SEED LOCALIZATION Left 11/19/2017   Procedure: BREAST LUMPECTOMY WITH RADIOACTIVE SEED LOCALIZATION;  Surgeon: Rolm Bookbinder, MD;  Location: [facility];  Service: General;  Laterality: Left;   BREAST SURGERY  2009   cancer-lymph node removal   NECK SURGERY     cyst removal   Patient Active Problem List   Diagnosis Date Noted   Chest pain at rest 06/13/2021   SVT (supraventricular tachycardia) ([place])    Acute saddle pulmonary embolism ([place])    Enlarged thyroid 06/23/2019   Genetic testing 10/03/2018   Family history of breast cancer    Family history of pancreatic cancer    Family history of prostate cancer    Presbycusis of both ears 08/25/2018   Laryngopharyngeal reflux (LPR) 06/10/2018   Malignant neoplasm of upper-inner quadrant of left breast in female, estrogen receptor positive ([place]) 09/24/2017   Internal hemorrhoids 09/23/2014   Family history of colon cancer 06/30/2014    History of colonic polyps 06/30/2014   GERD (gastroesophageal reflux disease) 08/27/2012   Diverticulosis    Osteoporosis    Pelvic relaxation    Cystocele    H/O vitamin D deficiency    Hyperlipidemia LDL goal <130 11/08/2008   Essential hypertension 04/13/2008   Osteoarthritis 04/13/2008    PCP: [name], MD  REFERRING PROVIDER: [name], MD  REFERRING DIAG: ***  THERAPY DIAG:  No diagnosis found.  ONSET DATE: ***  SUBJECTIVE:  SUBJECTIVE STATEMENT: ***  PERTINENT HISTORY:  ***  PAIN:  Are you having pain? {yes/no:20286} VAS scale: ***/10 Pain location: *** Pain orientation: {Pain Orientation:25161}  PAIN TYPE: {type:313116} Pain description: {PAIN DESCRIPTION:21022940}  Aggravating factors: *** Relieving factors: ***  PRECAUTIONS: {Therapy precautions:24002}  WEIGHT BEARING RESTRICTIONS {Yes ***/No:24003}  FALLS:  Has patient fallen in last 6 months? {yes/no:20286} Number of falls: ***  LIVING ENVIRONMENT: Lives with: {[facility] lives with:25569::"lives with their family"} Lives in: {Lives in:25570} Stairs: {yes/no:20286}; {Stairs:24000} Has following equipment at home: {Assistive devices:23999}  OCCUPATION: ***  PLOF: {PLOF:24004}  PATIENT GOALS ***  OBJECTIVE:   DIAGNOSTIC FINDINGS:  ***  PATIENT SURVEYS:  {rehab surveys:24030}   COGNITION: Overall cognitive status: {cognition:24006}   SENSATION: Light touch: {intact/deficits:24005} Hot/Cold: {intact/deficits:24005} Proprioception: {intact/deficits:24005}  POSTURE:  ***  PALPATION: ***   CERVICAL AROM/PROM  A/PROM A/PROM (deg) 08/28/2021  Flexion   Extension   Right lateral flexion   Left lateral flexion   Right rotation   Left rotation    (Blank rows = not  tested)  UE AROM/PROM:  A/PROM Right 08/28/2021 Left 08/28/2021  Shoulder flexion    Shoulder extension    Shoulder abduction    Shoulder adduction    Shoulder extension    Shoulder internal rotation    Shoulder external rotation    Elbow flexion    Elbow extension    Wrist flexion    Wrist extension    Wrist ulnar deviation    Wrist radial deviation    Wrist pronation    Wrist supination     (Blank rows = not tested)  UE MMT:  MMT Right 08/28/2021 Left 08/28/2021  Shoulder flexion    Shoulder extension    Shoulder abduction    Shoulder adduction    Shoulder extension    Middle trapezius    Lower trapezius    Elbow flexion    Elbow extension    Wrist flexion    Wrist extension    Wrist ulnar deviation    Wrist radial deviation    Wrist pronation    Wrist supination    Grip strength     (Blank rows = not tested)  CERVICAL SPECIAL TESTS:  {Cervical special tests:25246}   FUNCTIONAL TESTS:  {Functional tests:24029}    TODAY'S TREATMENT:  ***   PATIENT EDUCATION:  Education details: *** Person educated: {Person educated:25204} Education method: {Education Method:25205} Education comprehension: {Education Comprehension:25206}   HOME EXERCISE PROGRAM: ***  ASSESSMENT:  CLINICAL IMPRESSION: Patient is a *** y.o. *** who was seen today for physical therapy evaluation and treatment for ***. Objective impairments include {opptimpairments:25111}. These impairments are limiting patient from {activity limitations:25113}. Personal factors including {Personal factors:25162} are also affecting patient's functional outcome. Patient will benefit from skilled PT to address above impairments and improve overall function.  REHAB POTENTIAL: {rehabpotential:25112}  CLINICAL DECISION MAKING: {clinical decision making:25114}  EVALUATION COMPLEXITY: {Evaluation complexity:25115}   GOALS: Goals reviewed with patient? {yes/no:20286}  SHORT TERM GOALS:  STG  Name Target Date Goal status  1 *** Baseline:  {follow up:25551} {GOALSTATUS:25110}   LONG TERM GOALS:   LTG Name Target Date Goal status  1 *** Baseline: {follow up:25551} {GOALSTATUS:25110}  2 *** Baseline: {follow up:25551} {GOALSTATUS:25110}  3 *** Baseline: {follow up:25551} {GOALSTATUS:25110}  4 *** Baseline: {follow up:25551} {GOALSTATUS:25110}   PLAN: PT FREQUENCY: {rehab frequency:25116}  PT DURATION: {rehab duration:25117}  PLANNED INTERVENTIONS: {rehab planned interventions:25118::"Therapeutic exercises","Therapeutic activity","Neuro Muscular re-education","Balance training","Gait training","Patient/Family education","Joint mobilization"}  PLAN FOR NEXT SESSION: ***  Starr Lake 08/28/2021, 4:02 PM

## 2021-08-29 ENCOUNTER — Ambulatory Visit: Payer: Medicare PPO | Attending: Family Medicine | Admitting: Physical Therapy

## 2021-08-29 ENCOUNTER — Encounter: Payer: Self-pay | Admitting: Physical Therapy

## 2021-08-29 ENCOUNTER — Other Ambulatory Visit: Payer: Self-pay

## 2021-08-29 ENCOUNTER — Other Ambulatory Visit: Payer: Self-pay | Admitting: *Deleted

## 2021-08-29 DIAGNOSIS — M6281 Muscle weakness (generalized): Secondary | ICD-10-CM | POA: Diagnosis not present

## 2021-08-29 DIAGNOSIS — M1990 Unspecified osteoarthritis, unspecified site: Secondary | ICD-10-CM | POA: Diagnosis not present

## 2021-08-29 DIAGNOSIS — G8929 Other chronic pain: Secondary | ICD-10-CM | POA: Insufficient documentation

## 2021-08-29 DIAGNOSIS — M25552 Pain in left hip: Secondary | ICD-10-CM | POA: Diagnosis not present

## 2021-08-29 DIAGNOSIS — M25561 Pain in right knee: Secondary | ICD-10-CM | POA: Diagnosis not present

## 2021-08-29 NOTE — Therapy (Signed)
OUTPATIENT PHYSICAL THERAPY LOWER EXTREMITY EVALUATION   Patient Name: Shannon Jensen MRN: [id number] DOB:02-25-1928, 87 y.o., female Today's Date: 08/29/2021   PT End of Session - 08/29/21 1323     Visit Number 1    Number of Visits 13    Date for PT Re-Evaluation 10/10/21    Authorization Type [employer] MCR    Progress Note Due on Visit 10    PT Start Time 1962    PT Stop Time 2297    PT Time Calculation (min) 44 min    Activity Tolerance Patient tolerated treatment well    Behavior During Therapy [facility] for tasks assessed/performed             Past Medical History:  Diagnosis Date   Anxiety and depression    Arthritis    Breast cancer ([place]) 08/2008   Right Breast Cancer   Breast cancer ([place]) 2019   Left Breast Cancer   Chronic headaches    Cystocele    Diverticulosis    Family history of breast cancer    Family history of colon cancer    Family history of pancreatic cancer    Family history of prostate cancer    GERD (gastroesophageal reflux disease)    H/O vitamin D deficiency    HLD (hyperlipidemia)    Hypertension    Osteoarthritis    Osteoporosis    Pelvic relaxation    Personal history of radiation therapy 2009   Right Breast Cancer   Pulmonary embolism ([place]) 06/2021   Urinary tract infection 2014   Past Surgical History:  Procedure Laterality Date   ABDOMINAL HYSTERECTOMY  1998   APPENDECTOMY  1970   BREAST LUMPECTOMY Right 2009   BREAST LUMPECTOMY Left 2019   BREAST LUMPECTOMY WITH RADIOACTIVE SEED LOCALIZATION Left 11/19/2017   Procedure: BREAST LUMPECTOMY WITH RADIOACTIVE SEED LOCALIZATION;  Surgeon: Rolm Bookbinder, MD;  Location: [place];  Service: General;  Laterality: Left;   BREAST SURGERY  2009   cancer-lymph node removal   NECK SURGERY     cyst removal   Patient Active Problem List   Diagnosis Date Noted   Chest pain at rest 06/13/2021   SVT (supraventricular tachycardia) ([place])    Acute saddle pulmonary embolism ([place])    Enlarged  thyroid 06/23/2019   Genetic testing 10/03/2018   Family history of breast cancer    Family history of pancreatic cancer    Family history of prostate cancer    Presbycusis of both ears 08/25/2018   Laryngopharyngeal reflux (LPR) 06/10/2018   Malignant neoplasm of upper-inner quadrant of left breast in female, estrogen receptor positive ([place]) 09/24/2017   Internal hemorrhoids 09/23/2014   Family history of colon cancer 06/30/2014   History of colonic polyps 06/30/2014   GERD (gastroesophageal reflux disease) 08/27/2012   Diverticulosis    Osteoporosis    Pelvic relaxation    Cystocele    H/O vitamin D deficiency    Hyperlipidemia LDL goal <130 11/08/2008   Essential hypertension 04/13/2008   Osteoarthritis 04/13/2008    PCP: [name], MD  REFERRING PROVIDER: [name], MD  REFERRING DIAG: Osteoarthritis, unspecified osteoarthritis type, unspecified site [M19.90]  THERAPY DIAG:  Muscle weakness (generalized)  Pain in left hip  Chronic pain of right knee  ONSET DATE: many years ago  SUBJECTIVE:   SUBJECTIVE STATEMENT: Pt reports having R and L hip pain with the Left being worst than the R. She reports additional bil knee pain with R >L.  Pt denies any specific MOI. Per pt's daughter she had a pulmonary embolism and was admitted to the hospital for abotu 3 days in October 2022.  She reports to pain fluctuates depending on activity.  PERTINENT HISTORY: Hx of pulmonary embolism, OA, osteoporosis  PAIN:  Are you having pain? Yes VAS scale: 7/10 Pain location: L hip, low back and R knee Pain orientation: Right and Left  PAIN TYPE: aching, sharp, and spasm Pain description: intermittent  Aggravating factors: L hip walking/ standing, Rknee always with any activity  Relieving factors: sitting and resting  PRECAUTIONS: None  WEIGHT BEARING RESTRICTIONS No  FALLS:  Has patient fallen in last 6 months? No, Number of falls: 0   LIVING ENVIRONMENT: Lives  with: lives with their family and lives alone lived alone before pulmonary embolism Lives in: House/apartment Stairs: No;  Has following equipment at home: Single point cane, Electronics engineer, Grab bars, and    OCCUPATION: Retired  PLOF: Independent with basic ADLs and Independent with community mobility with device  PATIENT GOALS to decrease pain   OBJECTIVE:   DIAGNOSTIC FINDINGS: N/A  PATIENT SURVEYS:  FOTO 31% function, predicted 37%  COGNITION:  Overall cognitive status: Within functional limits for tasks assessed    POSTURE:  FHP,  PALPATION: R knee TTP along the lateral joint, and along the patellar tendon and distal hamstrings.  L hip, TTP along the glute med/ minimus with multiple trigger points. Low back L SIJ and lumbar paraspinal tenderness   LE AROM/PROM:  A/PROM Right 08/29/2021 Left 08/29/2021  Hip flexion WNL WNL  Hip extension WNL WNL  Hip abduction WNL WNL  Hip adduction    Hip internal rotation    Hip external rotation    Knee flexion    Knee extension    Ankle dorsiflexion    Ankle plantarflexion    Ankle inversion    Ankle eversion     (Blank rows = not tested)  LE MMT:  MMT Right 08/29/2021 Left 08/29/2021  Hip flexion 4 4-  Hip extension 3+ 3+  Hip abduction 3+ 3+  Hip adduction 3+ 3+  Hip internal rotation    Hip external rotation    Knee flexion 3+ 4-   Knee extension 3+ 4-  Ankle dorsiflexion    Ankle plantarflexion    Ankle inversion    Ankle eversion     (Blank rows = not tested)  LOWER EXTREMITY SPECIAL TESTS:    FUNCTIONAL TESTS:  5 times sit to stand: unable to perform 1 x Timed up and go (TUG): 34 secs  GAIT: Distance walked: into clinic Assistive device utilized: Single point cane Level of assistance: CGA Comments: antalgic, trendelenberg gait pattern with limited stride/ step length bil, HHA for safety      TODAY'S TREATMENT: [facility] Adult PT Treatment:     DATE: 08/29/2021 Therapeutic Exercise: Bridge 1  x 10 Clamshell 1 x 10 with RTB LTR 1 x 10       PATIENT EDUCATION:  Education details: Evaluation findings, POC, goals, HEP with proper form/ rationale.  Person educated: Patient Education method: Explanation Education comprehension: verbalized understanding   HOME EXERCISE PROGRAM: Access Code: [email or web address] URL: [email or web address] Date: 08/29/2021 Prepared by: Starr Lake  Exercises Lower Trunk Rotation - 1 x daily - 7 x weekly - 2 sets - 10 reps Hooklying Clamshell with Resistance - 1 x daily - 7 x weekly - 2 sets - 10 reps Seated March - 1 x daily - 7 x weekly -  2 sets - 10 reps Supine Bridge - 1 x daily - 7 x weekly - 2 sets - 10 reps   ASSESSMENT:  CLINICAL IMPRESSION: Patient is a 87 y.o. F who was seen today for physical therapy evaluation and treatment for L hip and R knee pain that has been present for many years with no specific MOI. She demonstrates gross weakness in bil LE, and TTP along R lateral knee joint line, and L glute med and lumabr paraspinals. Increased time to perform tug at 34 seconds indicating higher level of frailty. Objective impairments include Abnormal gait, decreased balance, decreased endurance, decreased strength, hypomobility, increased fascial restrictions, increased muscle spasms, improper body mechanics, and pain. These impairments are limiting patient from cleaning, laundry, and yard work. Personal factors including Age and 2-3 comorbidities: hx of pulmonary embolism, breast cx, Osteoproosis are also affecting patient's functional outcome. Patient will benefit from skilled PT to address above impairments and improve overall function.  REHAB POTENTIAL: Good  CLINICAL DECISION MAKING: Evolving/moderate complexity  EVALUATION COMPLEXITY: Moderate   GOALS: Goals reviewed with patient? Yes  SHORT TERM GOALS:  STG Name Target Date Goal status  1 Pt to be IND with HEP Baseline:  09/19/2021 INITIAL   LONG TERM GOALS:    LTG Name Target Date Goal status  1 Increase gross LE strength to >/= 4/5 to promote hip stability  Baseline: 10/10/2021 INITIAL  2 Pt to be able to walk for >/= 30 min with LRAD with </= supervision for safety Baseline: 10/10/2021 INITIAL  3 Pt to be able to perform sit to stand with no use of UE >/= 5 x  to demo improvement in funciton/ strength Baseline: 10/10/2021 INITIAL  4 Pt to increase FOTO to >/= 37% to demo improvement in function Baseline: 10/10/2021 INITIAL  5 Pt to reduce TUG by >/= 8 seconds to demo improvement in safety Baseline: initial testing 34 secs 10/10/2021 INITIAL  6 Pt to be IND with all HEP and is able to maintain and progress her current LOF IND Baseline: 10/10/2021 INITIAL   PLAN: PT FREQUENCY: 2x/week  PT DURATION: 6 weeks  PLANNED INTERVENTIONS: Therapeutic exercises, Therapeutic activity, Neuro Muscular re-education, Balance training, Gait training, Patient/Family education, Joint mobilization, Stair training, Aquatic Therapy, Dry Needling, Cryotherapy, Moist heat, Taping, and Manual therapy  PLAN FOR NEXT SESSION: Review/ update HEP PRN,  Reviewed FOTO, work transition of sit to stand, and general bed mobility, gross hip/ knee strength,   Sahara Fujimoto PT, DPT, LAT, ATC  08/29/21  2:22 PM       Referring diagnosis? Osteoarthritis, unspecified osteoarthritis type, unspecified site [M19.90] Treatment diagnosis? (if different than referring diagnosis) m62.81, m25.552, Y86578 What was this (referring dx) caused by? []  Surgery []  Fall []  Ongoing issue [x]  Arthritis [x]  Other: ______Pain______  Laterality: []  Rt []  Lt [x]  Both  Check all possible CPT codes:  *CHOOSE 10 OR LESS*    []  97110 (Therapeutic Exercise)  []  92507 (SLP Treatment)  []  97112 (Neuro Re-ed)   []  92526 (Swallowing Treatment)   []  97116 (Gait Training)   []  [id number] (Cognitive Training, 1st 15 minutes) []  97140 (Manual Therapy)   []  97130 (Cognitive Training, each add'l 15  minutes)  []  97530 (Therapeutic Activities)  []  Other, List CPT Code ____________    []  46962 (Self Care)       [x]  All codes above (97110 - 97535)  []  97012 (Mechanical Traction)  []  97014 (E-stim Unattended)  []  97032 (E-stim manual)  []  95284 (  Ionto)  []  [id number] (Ultrasound)  []  [id number] Therapist, art) [x]  [phone] (Physical Performance Training) [x]  [id number] (Aquatic Therapy) []  [phone] (Contrast Bath) []  [id number] (Paraffin) []  97597 (Wound Care 1st 20 sq cm) []  97598 (Wound Care each add'l 20 sq cm) []  97016 (Vasopneumatic Device) []  [phone] Comptroller) []  [phone] (Prosthetic Training)

## 2021-08-29 NOTE — Patient Outreach (Signed)
Care Management Clinical Social Work Note  08/29/2021 Name: Shannon Jensen MRN: [id number] DOB: 1928/03/17  Shannon Jensen is a 87 y.o. year old female who is a primary care patient of [name], MD.  The Care Management team was consulted for assistance with chronic disease management and coordination needs.  Engaged with patient's daughter by telephone for follow up visit in response to provider referral for social work chronic care management and care coordination services  Consent to Services:  Shannon Jensen was given information about Care Management services today including:  Care Management services includes personalized support from designated clinical staff supervised by her physician, including individualized plan of care and coordination with other care providers 24/7 contact phone numbers for assistance for urgent and routine care needs. The patient may stop case management services at any time by phone call to the office staff.  Patient agreed to services and consent obtained.   Assessment: Review of patient past medical history, allergies, medications, and health status, including review of relevant consultants reports was performed today as part of a comprehensive evaluation and provision of chronic care management and care coordination services.  SDOH (Social Determinants of Health) assessments and interventions performed:    Advanced Directives Status: Not addressed in this encounter.  Care Plan  Allergies  Allergen Reactions   Celecoxib Other (See Comments)    Unknown   Erythromycin Other (See Comments)    Unknown   Nabumetone Other (See Comments)    Unknown   Nsaids Other (See Comments)    Unknown Unknown    Oxycodone-Acetaminophen Other (See Comments)   Tyloxapol    Vioxx [Rofecoxib] Other (See Comments)    Unknown    Outpatient Encounter Medications as of 08/29/2021  Medication Sig   acetaminophen (TYLENOL) 325 MG tablet Take 650 mg by mouth  every 6 (six) hours as needed for moderate pain or headache.    amLODipine (NORVASC) 5 MG tablet Take 1 tablet (5 mg total) by mouth daily.   atorvastatin (LIPITOR) 10 MG tablet Take 1 tablet (10 mg total) by mouth daily. (Patient not taking: Reported on 08/07/2021)   Cholecalciferol (VITAMIN D) 2000 units CAPS Take 2,000 Units by mouth daily.   diclofenac Sodium (VOLTAREN) 1 % GEL Apply 2 g topically daily as needed. (Patient not taking: Reported on 08/07/2021)   Fluocinolone Acetonide 0.01 % OIL  (Patient not taking: Reported on 08/07/2021)   ibandronate (BONIVA) 150 MG tablet Take 1 tablet (150 mg total) by mouth every 30 (thirty) days. Take in the morning with a full glass of water, on an empty stomach, and do not take anything else by mouth or lie down for the next 30 min. (Patient not taking: Reported on 08/07/2021)   metoprolol tartrate (LOPRESSOR) 25 MG tablet Take 1 tablet (25 mg total) by mouth 2 (two) times daily.   Multiple Vitamins-Minerals (WOMENS ONE DAILY) TABS Take 1 tablet by mouth daily.     Omega-3 Fatty Acids (FISH OIL) 1200 MG CAPS Take 1,200 mg by mouth daily.    rivaroxaban (XARELTO) 20 MG TABS tablet Take 1 tablet (20 mg total) by mouth daily with supper.   RIVAROXABAN (XARELTO) VTE STARTER PACK (15 & 20 MG) Follow package directions: Take one 15mg  tablet by mouth twice a day. On day 22, switch to one 20mg  tablet once a day. Take with food. (Patient not taking: Reported on 08/07/2021)   tamoxifen (NOLVADEX) 10 MG tablet Take 1 tablet (10 mg total) by mouth daily.  No facility-administered encounter medications on file as of 08/29/2021.    Patient Active Problem List   Diagnosis Date Noted   Chest pain at rest 06/13/2021   SVT (supraventricular tachycardia) ([place])    Acute saddle pulmonary embolism ([place])    Enlarged thyroid 06/23/2019   Genetic testing 10/03/2018   Family history of breast cancer    Family history of pancreatic cancer    Family history of prostate  cancer    Presbycusis of both ears 08/25/2018   Laryngopharyngeal reflux (LPR) 06/10/2018   Malignant neoplasm of upper-inner quadrant of left breast in female, estrogen receptor positive ([place]) 09/24/2017   Internal hemorrhoids 09/23/2014   Family history of colon cancer 06/30/2014   History of colonic polyps 06/30/2014   GERD (gastroesophageal reflux disease) 08/27/2012   Diverticulosis    Osteoporosis    Pelvic relaxation    Cystocele    H/O vitamin D deficiency    Hyperlipidemia LDL goal <130 11/08/2008   Essential hypertension 04/13/2008   Osteoarthritis 04/13/2008    Conditions to be addressed/monitored: HTN and HLD.  Level of Care Concerns, ADL/IADL Limitations, Limited Access to Caregiver, Cognitive Deficits, Memory Deficits, and Lacks Knowledge of community Resources.  Care Plan : LCSW Plan of Care  Updates made by Shannon Gaines, LCSW since 08/29/2021 12:00 AM     Problem: Receive Skilled Nursing Placement for Short-Term Rehabilitative Services.   Priority: High     Goal: Receive Skilled Nursing Placement for [employer].   Start Date: 06/29/2021  Expected End Date: 09/27/2021  This Visit's Progress: On track  Recent Progress: On track  Priority: High  Note:   Current Barriers:  Chronic disease management support and education needs related to Mobility Issues, Fall Risk, Osteoporosis, Osteoarthritis and Hypertension. Inability to perform ADL's/IADL's independently, limited mobility, and now requiring 24 hour care and supervision.   Clinical Goal(s):  Patient and daughter will work with LCSW to identify and address any acute and chronic care coordination needs. Patient and daughter will work with LCSW to pursue skilled nursing facility placement for short-term rehabilitative services, versus home health physical and occupational therapy services.  LCSW Interventions:  Inter-disciplinary care team collaboration (see longitudinal plan of  care). Collaboration with Primary Care Physician, Dr. Jill Alexanders to provide updates on patient's plan of care.     Attentive Listening provided, Problem Solving Skills utilized, Task-Centered Solutions identified, and Caregiver Stress acknowledged.   Patient Goals/Self-Care Activities:   Continue to receive bi-weekly calls from LCSW, in an effort to coordinate skilled nursing facility placement for short-term rehabilitative services.   Continue to receive [place], Physical Therapy, and Occupational Therapy services, through [facility] (# [phone]).    LCSW collaboration with admissions coordinator at [facility] (# [phone]), to confirm receipt of all home health physical and occupational therapy notes faxed thus far, via [facility] [place].   Please keep appointment with Starr Lake, Physical Therapist with [facility] at [facility] (# [phone]), scheduled for 08/29/2021 at 1:30 pm.  ~  Follow-up with LCSW to report findings and recommendations. LCSW collaboration with Primary Care Physician, Dr. Jill Alexanders to request updated [place]-2 Form. Contact LCSW directly (# [id number]) if you have questions, need assistance, or if additional social work needs are identified between now and our next scheduled telephone outreach call. Follow-Up Plan:  09/27/2021 at 10:30 am     Nat Christen, BSW, MSW, LCSW  Licensed Holiday representative  [place]  [employer] [place] [address], [place], [place] [address] Physical Address-[address], [place], [place] [address] Toll Free Main # [phone] Fax # [phone] Cell # [phone]  [email or web address]

## 2021-08-30 NOTE — Assessment & Plan Note (Signed)
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  Because she has several family members with prostate cancer,we performed genetic testing: Results were negative for deleterious mutations  Breast cancer surveillance: 1.Breast exam 08/31/2021: Benign, palpable nodularity at the site of surgery 2.Mammogram and ultrasound 06/21/2020: Stable postoperative seroma. No evidence of malignancy. Breast density category B  Return to clinicin65yror follow-up

## 2021-08-30 NOTE — Progress Notes (Signed)
Patient Care Team: [name], MD as PCP - General (Family Medicine) [name], RN as [place] [facility] [place], Maree Erie, LCSW as [place] [facility]  DIAGNOSIS:    ICD-10-CM   1. Malignant neoplasm of upper-inner quadrant of left breast in female, estrogen receptor positive ([place])  C50.212    Z17.0       SUMMARY OF ONCOLOGIC HISTORY: Oncology History  History of right breast cancer (Resolved)  05/24/2008 Surgery    Right breast lumpectomy revealing 0.9 cm grade 1 invasive ductal carcinoma with negative margins, ER positive, PR positive, HER-2 negative   06/29/2008 - 08/06/2008 Radiation Therapy    adjuvant radiation therapy   10/11/2008 - 10/08/2013 Anti-estrogen oral therapy    antiestrogen therapy with Femara then switched to tamoxifen due to osteoporosis completed 5 years of tamoxifen , patient declined extended adjuvant therapy.   Malignant neoplasm of upper-inner quadrant of left breast in female, estrogen receptor positive ([place])  09/18/2017 Initial Diagnosis   Left breast biopsy 11 o'clock position: IDC grade 2, 0.4 cm by mammogram.  The biopsy had 0.5 cm of material.  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  The Hereditary Gene Panel offered by [facility] includes sequencing and/or deletion duplication testing of the following 47 genes: APC, ATM, AXIN2, BARD1, BMPR1A, BRCA1, BRCA2, BRIP1, CDH1, CDK4, CDKN2A (p14ARF), CDKN2A (p16INK4a), CHEK2, CTNNA1, DICER1, EPCAM (Deletion/duplication testing only), GREM1 (promoter region deletion/duplication testing only), KIT, MEN1, MLH1, MSH2, MSH3, MSH6,  MUTYH, NBN, NF1, NHTL1, PALB2, PDGFRA, PMS2, POLD1, POLE, PTEN, RAD50, RAD51C, RAD51D, SDHB, SDHC, SDHD, SMAD4, SMARCA4. STK11, TP53, TSC1, TSC2, and VHL.  The following genes were evaluated for sequence changes only: SDHA and HOXB13 c.251G>A variant only. The report date is October 02, 2018.  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  The amended report date is March 20, 2021.     CHIEF COMPLIANT: Follow-up of left breast cancer on tamoxifen therapy  INTERVAL HISTORY: Shannon Jensen is a 87 y.o. with above-mentioned history of left breast cancer who underwent a lumpectomy and is currently on tamoxifen. She presents to the clinic today for annual follow-up.  In October she was diagnosed with blood clots and is currently on blood thinners with Xarelto.  ALLERGIES:  is allergic to celecoxib, erythromycin, nabumetone, nsaids, oxycodone-acetaminophen, tyloxapol, and vioxx [rofecoxib].  MEDICATIONS:  Current Outpatient Medications  Medication Sig Dispense Refill   acetaminophen (TYLENOL) 325 MG tablet Take 650 mg by mouth every 6 (six) hours as needed for moderate pain or headache.      amLODipine (NORVASC) 5 MG tablet Take 1 tablet (5 mg total) by mouth daily. 90 tablet 3   atorvastatin (LIPITOR) 10 MG tablet Take 1 tablet (10 mg total) by mouth daily. (Patient not taking: Reported on 08/07/2021) 90 tablet 3   Cholecalciferol (VITAMIN D) 2000 units CAPS Take 2,000 Units by mouth daily.     diclofenac Sodium (VOLTAREN) 1 % GEL Apply 2 g topically daily as needed. (Patient not taking: Reported on 08/07/2021)     Fluocinolone Acetonide 0.01 % OIL  (Patient  not taking: Reported on 08/07/2021)     ibandronate (BONIVA) 150 MG tablet Take 1 tablet (150 mg total) by mouth every 30 (thirty) days. Take in the morning with a full glass of water, on an empty stomach, and do not take anything else by mouth or lie down for the next 30 min. (Patient not taking: Reported on  08/07/2021) 3 tablet 3   metoprolol tartrate (LOPRESSOR) 25 MG tablet Take 1 tablet (25 mg total) by mouth 2 (two) times daily. 60 tablet 2   Multiple Vitamins-Minerals (WOMENS ONE DAILY) TABS Take 1 tablet by mouth daily.       Omega-3 Fatty Acids (FISH OIL) 1200 MG CAPS Take 1,200 mg by mouth daily.      rivaroxaban (XARELTO) 20 MG TABS tablet Take 1 tablet (20 mg total) by mouth daily with supper. 90 tablet 1   RIVAROXABAN (XARELTO) VTE STARTER PACK (15 & 20 MG) Follow package directions: Take one 23m tablet by mouth twice a day. On day 22, switch to one 21mtablet once a day. Take with food. (Patient not taking: Reported on 08/07/2021) 51 each 0   tamoxifen (NOLVADEX) 10 MG tablet Take 1 tablet (10 mg total) by mouth daily. 90 tablet 3   No current facility-administered medications for this visit.    PHYSICAL EXAMINATION: ECOG PERFORMANCE STATUS: 1 - Symptomatic but completely ambulatory  Vitals:   08/31/21 1023  BP: (!) 145/50  Pulse: 68  Resp: 18  Temp: (!) 97.5 F (36.4 C)  SpO2: 100%   Filed Weights   08/31/21 1023  Weight: 150 lb 3.2 oz (68.1 kg)    BREAST: No palpable masses or nodules in either right or left breasts. No palpable axillary supraclavicular or infraclavicular adenopathy no breast tenderness or nipple discharge. (exam performed in the presence of a chaperone)  LABORATORY DATA:  I have reviewed the data as listed CMP Latest Ref Rng & Units 06/15/2021 06/14/2021 06/12/2021  Glucose 70 - 99 mg/dL 101(H) 97 102(H)  BUN 8 - 23 mg/dL _0 Creatinine 0.44 - 1.00 mg/dL 1.00 1.25(H) 1.31(H)  Sodium 135 - 145 mmol/L 131(L) 132(L) 134(L)  Potassium 3.5 - 5.1 mmol/L 4.4 4.5 4.2  Chloride 98 - 111 mmol/L 102 104 102  CO2 22 - 32 mmol/L 20(L) 20(L) 19(L)  Calcium 8.9 - 10.3 mg/dL 8.6(L) 8.8(L) 9.2  Total Protein 6.5 - 8.1 g/dL - 5.9(L) -  Total Bilirubin 0.3 - 1.2 mg/dL - 0.7 -  Alkaline Phos 38 - 126 U/L - 33(L) -  AST 15 - 41 U/L - 22 -  ALT 0 - 44 U/L - 16  -    Lab Results  Component Value Date   WBC 7.0 06/16/2021   HGB 10.3 (L) 06/16/2021   HCT 30.6 (L) 06/16/2021   MCV 90.8 06/16/2021   PLT 171 06/16/2021   NEUTROABS 5.5 06/12/2021    ASSESSMENT & PLAN:  Malignant neoplasm of upper-inner quadrant of left breast in female, [place] receptor positive [place]: Left breast biopsy 11 o'clock position: IDC grade 2 11/19/2017:Left lumpectomy: IDC grade 1, 0.6 cm, low-grade DCIS, margins negative, T1 be N0 stage I a, ER 100%, PR 70%, HER-2 negative, Ki-67 2%   (History of right breast cancer 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    Breast cancer surveillance: 1.  Breast exam 08/31/2021: Benign, palpable nodularity at the site of surgery 2.  Mammogram and ultrasound 06/21/2020: Stable postoperative seroma.  No evidence of malignancy.  Breast density category B She will schedule a mammogram in the next month or so.  Return to clinic in 1 yr for follow-up   No orders of the defined types were placed in this encounter.  The patient has a good understanding of the overall plan. she agrees with it. she will call with any problems that may develop before the next visit here.  Total time spent: 20 mins including face to face time and time spent for planning, charting and coordination of care  Rulon Eisenmenger, MD, MPH 08/31/2021  I, Thana Ates, am acting as scribe for Dr. Nicholas Lose.  I have reviewed the above documentation for accuracy and completeness, and I agree with the above.

## 2021-08-31 ENCOUNTER — Other Ambulatory Visit: Payer: Self-pay

## 2021-08-31 ENCOUNTER — Inpatient Hospital Stay: Payer: Medicare PPO | Attending: Hematology and Oncology | Admitting: Hematology and Oncology

## 2021-08-31 DIAGNOSIS — Z79899 Other long term (current) drug therapy: Secondary | ICD-10-CM | POA: Insufficient documentation

## 2021-08-31 DIAGNOSIS — Z79810 Long term (current) use of selective estrogen receptor modulators (SERMs): Secondary | ICD-10-CM | POA: Insufficient documentation

## 2021-08-31 DIAGNOSIS — Z170 Estrogen receptor positive status [ER+]: Secondary | ICD-10-CM | POA: Diagnosis not present

## 2021-08-31 DIAGNOSIS — C50212 Malignant neoplasm of upper-inner quadrant of left female breast: Secondary | ICD-10-CM | POA: Diagnosis not present

## 2021-08-31 DIAGNOSIS — Z853 Personal history of malignant neoplasm of breast: Secondary | ICD-10-CM | POA: Insufficient documentation

## 2021-09-07 ENCOUNTER — Other Ambulatory Visit: Payer: Self-pay

## 2021-09-07 ENCOUNTER — Ambulatory Visit
Admission: RE | Admit: 2021-09-07 | Discharge: 2021-09-07 | Disposition: A | Discharge status: Home / Self Care | Payer: Medicare PPO | Source: Ambulatory Visit | Attending: Hematology and Oncology | Admitting: Hematology and Oncology

## 2021-09-07 DIAGNOSIS — Z1231 Encounter for screening mammogram for malignant neoplasm of breast: Secondary | ICD-10-CM | POA: Diagnosis not present

## 2021-09-07 DIAGNOSIS — Z9889 Other specified postprocedural states: Secondary | ICD-10-CM

## 2021-09-08 ENCOUNTER — Ambulatory Visit: Payer: Medicare PPO

## 2021-09-08 DIAGNOSIS — M25561 Pain in right knee: Secondary | ICD-10-CM | POA: Diagnosis not present

## 2021-09-08 DIAGNOSIS — G8929 Other chronic pain: Secondary | ICD-10-CM

## 2021-09-08 DIAGNOSIS — M1990 Unspecified osteoarthritis, unspecified site: Secondary | ICD-10-CM | POA: Diagnosis not present

## 2021-09-08 DIAGNOSIS — M25552 Pain in left hip: Secondary | ICD-10-CM

## 2021-09-08 DIAGNOSIS — M6281 Muscle weakness (generalized): Secondary | ICD-10-CM

## 2021-09-08 NOTE — Therapy (Signed)
OUTPATIENT PHYSICAL THERAPY TREATMENT NOTE   Patient Name: Shannon Jensen MRN: [id number] DOB:[birth year], 87 y.o., female Today's Date: 09/08/2021  PCP: [name], MD REFERRING PROVIDER: [name], MD    Past Medical History:  Diagnosis Date   Anxiety and depression    Arthritis    Breast cancer [facility]) 08/2008   Right Breast Cancer   Breast cancer ([place]) 2019   Left Breast Cancer   Chronic headaches    Cystocele    Diverticulosis    Family history of breast cancer    Family history of colon cancer    Family history of pancreatic cancer    Family history of prostate cancer    GERD (gastroesophageal reflux disease)    H/O vitamin D deficiency    HLD (hyperlipidemia)    Hypertension    Osteoarthritis    Osteoporosis    Pelvic relaxation    Personal history of radiation therapy 2009   Right Breast Cancer   Pulmonary embolism ([place]) 06/2021   Urinary tract infection 2014   Past Surgical History:  Procedure Laterality Date   ABDOMINAL HYSTERECTOMY  1998   APPENDECTOMY  1970   BREAST LUMPECTOMY Right 2009   BREAST LUMPECTOMY Left 2019   BREAST LUMPECTOMY WITH RADIOACTIVE SEED LOCALIZATION Left 11/19/2017   Procedure: BREAST LUMPECTOMY WITH RADIOACTIVE SEED LOCALIZATION;  Surgeon: Rolm Bookbinder, MD;  Location: [facility];  Service: General;  Laterality: Left;   BREAST SURGERY  2009   cancer-lymph node removal   NECK SURGERY     cyst removal   Patient Active Problem List   Diagnosis Date Noted   Chest pain at rest 06/13/2021   SVT (supraventricular tachycardia) ([place])    Acute saddle pulmonary embolism ([place])    Enlarged thyroid 06/23/2019   Genetic testing 10/03/2018   Family history of breast cancer    Family history of pancreatic cancer    Family history of prostate cancer    Presbycusis of both ears 08/25/2018   Laryngopharyngeal reflux (LPR) 06/10/2018   Malignant neoplasm of upper-inner quadrant of left breast in female, estrogen receptor  positive ([place]) 09/24/2017   Internal hemorrhoids 09/23/2014   Family history of colon cancer 06/30/2014   History of colonic polyps 06/30/2014   GERD (gastroesophageal reflux disease) 08/27/2012   Diverticulosis    Osteoporosis    Pelvic relaxation    Cystocele    H/O vitamin D deficiency    Hyperlipidemia LDL goal <130 11/08/2008   Essential hypertension 04/13/2008   Osteoarthritis 04/13/2008    REFERRING DIAG: Osteoarthritis, unspecified osteoarthritis type, unspecified site [M19.90]   THERAPY DIAG:  No diagnosis found.  PERTINENT HISTORY: Hx of pulmonary embolism, OA, osteoporosis   PRECAUTIONS: Hx of pulmonary embolism, OA, osteoporosis   SUBJECTIVE: low back and knee pain persisting   PAIN:  Are you having pain? Yes VAS scale: 8/10 Pain location: R knee and low back  Pain orientation: Right  PAIN TYPE: burning Pain description: intermittent  Aggravating factors: stairs Relieving factors: rest    OBJECTIVE:    DIAGNOSTIC FINDINGS: N/A   PATIENT SURVEYS:  FOTO 31% function, predicted 37%   COGNITION:          Overall cognitive status: Within functional limits for tasks assessed                POSTURE:  FHP,   PALPATION: R knee TTP along the lateral joint, and along the patellar tendon and distal hamstrings.  L hip, TTP along the glute  med/ minimus with multiple trigger points. Low back L SIJ and lumbar paraspinal tenderness    LE AROM/PROM:   A/PROM Right 08/29/2021 Left 08/29/2021  Hip flexion WNL WNL  Hip extension WNL WNL  Hip abduction WNL WNL  Hip adduction      Hip internal rotation      Hip external rotation      Knee flexion      Knee extension      Ankle dorsiflexion      Ankle plantarflexion      Ankle inversion      Ankle eversion       (Blank rows = not tested)   LE MMT:   MMT Right 08/29/2021 Left 08/29/2021  Hip flexion 4 4-  Hip extension 3+ 3+  Hip abduction 3+ 3+  Hip adduction 3+ 3+  Hip internal rotation       Hip external rotation      Knee flexion 3+ 4-   Knee extension 3+ 4-  Ankle dorsiflexion      Ankle plantarflexion      Ankle inversion      Ankle eversion       (Blank rows = not tested)   LOWER EXTREMITY SPECIAL TESTS:      FUNCTIONAL TESTS:  5 times sit to stand: unable to perform 1 x Timed up and go (TUG): 34 secs   GAIT: Distance walked: into clinic Assistive device utilized: Single point cane Level of assistance: CGA Comments: antalgic, trendelenberg gait pattern with limited stride/ step length bil, HHA for safety          TODAY'S TREATMENT: [facility] Adult PT Treatment:     DATE: 09/08/2021 Therapeutic Exercise: B hip fallouts, 10x Seated hip and shoulder tosses 10x each, no weight Seated chops, 10x per side, no weight LTR 1 x 10, alt Supine march, alt 10x Supine alternating UE flexion, 10x Seated latissimus press with alternating marching x10 Seated latissimus press with alternating LAQS             PATIENT EDUCATION:  Education details: POC, goals, HEP with proper form/ rationale.  Person educated: Patient Education method: Explanation Education comprehension: verbalized understanding     HOME EXERCISE PROGRAM: Access Code: [email or web address] URL: [email or web address] Date: 08/29/2021 Prepared by: Starr Lake   Exercises Lower Trunk Rotation - 1 x daily - 7 x weekly - 2 sets - 10 reps Hooklying Clamshell with Resistance - 1 x daily - 7 x weekly - 2 sets - 10 reps Seated March - 1 x daily - 7 x weekly - 2 sets - 10 reps Supine Bridge - 1 x daily - 7 x weekly - 2 sets - 10 reps     ASSESSMENT:   CLINICAL IMPRESSION: Todays session focused on gentle ROM in trunk and hips as well as activities designed to engage core musculature and promote functional movement patterns.  Able to tolerate all tasks w/o increase symptoms.   REHAB POTENTIAL: Good   CLINICAL DECISION MAKING: Evolving/moderate complexity   EVALUATION COMPLEXITY:  Moderate     GOALS: Goals reviewed with patient? Yes   SHORT TERM GOALS:   STG Name Target Date Goal status  1 Pt to be IND with HEP Baseline:  09/19/2021 INITIAL    LONG TERM GOALS:    LTG Name Target Date Goal status  1 Increase gross LE strength to >/= 4/5 to promote hip stability  Baseline: 10/10/2021 INITIAL  2 Pt to be able to walk for >/= 30  min with LRAD with </= supervision for safety Baseline: 10/10/2021 INITIAL  3 Pt to be able to perform sit to stand with no use of UE >/= 5 x  to demo improvement in funciton/ strength Baseline: 10/10/2021 INITIAL  4 Pt to increase FOTO to >/= 37% to demo improvement in function Baseline: 10/10/2021 INITIAL  5 Pt to reduce TUG by >/= 8 seconds to demo improvement in safety Baseline: initial testing 34 secs 10/10/2021 INITIAL  6 Pt to be IND with all HEP and is able to maintain and progress her current LOF IND Baseline: 10/10/2021 INITIAL    PLAN: PT FREQUENCY: 2x/week   PT DURATION: 6 weeks   PLANNED INTERVENTIONS: Therapeutic exercises, Therapeutic activity, Neuro Muscular re-education, Balance training, Gait training, Patient/Family education, Joint mobilization, Stair training, Aquatic Therapy, Dry Needling, Cryotherapy, Moist heat, Taping, and Manual therapy   PLAN FOR NEXT SESSION: Review/ update HEP PRN,  Reviewed FOTO, work transition of sit to stand, and general bed mobility, gross hip/ knee strength, core strength and stabilization tasks    Lanice Shirts PT 09/08/2021, 7:48 AM

## 2021-09-13 ENCOUNTER — Other Ambulatory Visit: Payer: Self-pay

## 2021-09-13 ENCOUNTER — Encounter: Payer: Self-pay | Admitting: Physical Therapy

## 2021-09-13 ENCOUNTER — Ambulatory Visit: Payer: Medicare Other | Attending: Family Medicine | Admitting: Physical Therapy

## 2021-09-13 DIAGNOSIS — M25561 Pain in right knee: Secondary | ICD-10-CM | POA: Insufficient documentation

## 2021-09-13 DIAGNOSIS — M25552 Pain in left hip: Secondary | ICD-10-CM | POA: Insufficient documentation

## 2021-09-13 DIAGNOSIS — M6281 Muscle weakness (generalized): Secondary | ICD-10-CM | POA: Insufficient documentation

## 2021-09-13 DIAGNOSIS — G8929 Other chronic pain: Secondary | ICD-10-CM | POA: Insufficient documentation

## 2021-09-13 NOTE — Therapy (Signed)
OUTPATIENT PHYSICAL THERAPY TREATMENT NOTE   Patient Name: Shannon Jensen MRN: [id number] DOB:Jan 12, 1928, 88 y.o., female Today's Date: 09/13/2021  PCP: [name], MD REFERRING PROVIDER: [name], MD   PT End of Session - 09/13/21 1017     Visit Number 3    Number of Visits 13    Date for PT Re-Evaluation 10/10/21    Authorization Type [employer] MCR - KX mod at 15th visit    Progress Note Due on Visit 10    PT Start Time 1016    PT Stop Time 1101    PT Time Calculation (min) 45 min    Activity Tolerance Patient tolerated treatment well    Behavior During Therapy [facility] for tasks assessed/performed             Past Medical History:  Diagnosis Date   Anxiety and depression    Arthritis    Breast cancer ([place]) 08/2008   Right Breast Cancer   Breast cancer ([place]) 2019   Left Breast Cancer   Chronic headaches    Cystocele    Diverticulosis    Family history of breast cancer    Family history of colon cancer    Family history of pancreatic cancer    Family history of prostate cancer    GERD (gastroesophageal reflux disease)    H/O vitamin D deficiency    HLD (hyperlipidemia)    Hypertension    Osteoarthritis    Osteoporosis    Pelvic relaxation    Personal history of radiation therapy 2009   Right Breast Cancer   Pulmonary embolism ([place]) 06/2021   Urinary tract infection 2014   Past Surgical History:  Procedure Laterality Date   [place]   BREAST LUMPECTOMY Right 2009   BREAST LUMPECTOMY Left 2019   BREAST LUMPECTOMY WITH RADIOACTIVE SEED LOCALIZATION Left 11/19/2017   Procedure: BREAST LUMPECTOMY WITH RADIOACTIVE SEED LOCALIZATION;  Surgeon: Rolm Bookbinder, MD;  Location: [place];  Service: General;  Laterality: Left;   BREAST SURGERY  2009   cancer-lymph node removal   NECK SURGERY     cyst removal   Patient Active Problem List   Diagnosis Date Noted   Chest pain at rest 06/13/2021   SVT  (supraventricular tachycardia) ([place])    Acute saddle pulmonary embolism ([place])    Enlarged thyroid 06/23/2019   Genetic testing 10/03/2018   Family history of breast cancer    Family history of pancreatic cancer    Family history of prostate cancer    Presbycusis of both ears 08/25/2018   Laryngopharyngeal reflux (LPR) 06/10/2018   Malignant neoplasm of upper-inner quadrant of left breast in female, estrogen receptor positive ([place]) 09/24/2017   Internal hemorrhoids 09/23/2014   Family history of colon cancer 06/30/2014   History of colonic polyps 06/30/2014   GERD (gastroesophageal reflux disease) 08/27/2012   Diverticulosis    Osteoporosis    Pelvic relaxation    Cystocele    H/O vitamin D deficiency    Hyperlipidemia LDL goal <130 11/08/2008   Essential hypertension 04/13/2008   Osteoarthritis 04/13/2008    REFERRING DIAG: Osteoarthritis, unspecified osteoarthritis type, unspecified site [M19.90]   THERAPY DIAG:  Muscle weakness (generalized)  Pain in left hip  Chronic pain of right knee  PERTINENT HISTORY: Hx of pulmonary embolism, OA, osteoporosis   PRECAUTIONS: Hx of pulmonary embolism, OA, osteoporosis   SUBJECTIVE: "I have been doing better. I've been doing do my  PAIN:  Are you having pain? Yes VAS scale: 9/10 for the L hip, and 7/10 in the R knee PAIN TYPE: sharp for L hip, sore for the R knee Pain description: constant  Aggravating factors: standing/ walking  Relieving factors: heat, topical ointment, salon pas    OBJECTIVE:   **Measurements captured at evaluation unless otherwise noted** DIAGNOSTIC FINDINGS: N/A   PATIENT SURVEYS:  FOTO 31% function, predicted 37%   COGNITION:          Overall cognitive status: Within functional limits for tasks assessed                POSTURE:  FHP,   PALPATION: R knee TTP along the lateral joint, and along the patellar tendon and distal hamstrings.  L hip, TTP along the glute med/ minimus with multiple  trigger points. Low back L SIJ and lumbar paraspinal tenderness    LE AROM/PROM:   A/PROM Right 08/29/2021 Left 08/29/2021  Hip flexion WNL WNL  Hip extension WNL WNL  Hip abduction WNL WNL  Hip adduction      Hip internal rotation      Hip external rotation      Knee flexion      Knee extension      Ankle dorsiflexion      Ankle plantarflexion      Ankle inversion      Ankle eversion       (Blank rows = not tested)   LE MMT:   MMT Right 08/29/2021 Left 08/29/2021  Hip flexion 4 4-  Hip extension 3+ 3+  Hip abduction 3+ 3+  Hip adduction 3+ 3+  Hip internal rotation      Hip external rotation      Knee flexion 3+ 4-   Knee extension 3+ 4-  Ankle dorsiflexion      Ankle plantarflexion      Ankle inversion      Ankle eversion       (Blank rows = not tested)   LOWER EXTREMITY SPECIAL TESTS:      FUNCTIONAL TESTS:  5 times sit to stand: unable to perform 1 x Timed up and go (TUG): 34 secs   GAIT: Distance walked: into clinic Assistive device utilized: Single point cane Level of assistance: CGA Comments: antalgic, trendelenberg gait pattern with limited stride/ step length bil, HHA for safety          TODAY'S TREATMENT:  [facility] Adult PT Treatment:                                                DATE: 09/13/2021 Therapeutic Exercise: LTR 2 x 10 Clamshell 2 x 10 with RTB Supine marching with RTB around knees 2 x 10 (alternating L/R) Isometric hip adduction ball squeeze 2 x 10 holding 5 x 10 Bridge 1 x 10 with 1 sec glute set Manual Therapy: MTPR along the glute med x 4 LLE LAD grade II Therapeutic Activity: Sit to stand transition from elevated table 2 x 5 Cues for proper form utilizing nose over toes and being scooted to the end of the table Min assist with rocking forward which greatly improved her standing transition.   [facility] Adult PT Treatment:     DATE: 09/08/2021 Therapeutic Exercise: B hip fallouts, 10x Seated hip and shoulder tosses 10x each,  no weight Seated chops, 10x per  side, no weight LTR 1 x 10, alt Supine march, alt 10x Supine alternating UE flexion, 10x Seated latissimus press with alternating marching x10 Seated latissimus press with alternating LAQS             PATIENT EDUCATION:                09/13/2020 Education details: Reviewed HEP.  Person educated: Patient Education method: Explanation Education comprehension: verbalized understanding     HOME EXERCISE PROGRAM: Access Code: [email or web address] URL: [email or web address] Date: 09/13/2021 Prepared by: Starr Lake  Exercises Lower Trunk Rotation - 1 x daily - 7 x weekly - 2 sets - 10 reps Hooklying Clamshell with Resistance - 1 x daily - 7 x weekly - 2 sets - 10 reps Seated March - 1 x daily - 7 x weekly - 2 sets - 10 reps Supine Bridge - 1 x daily - 7 x weekly - 2 sets - 10 reps Sit to Stand - 1 x daily - 7 x weekly - 2 sets - 8 reps    ASSESSMENT:   CLINICAL IMPRESSION: Pt arrives to session reporting consistently and that she generally is doing better despite coming in today reporting pain at 7/10 for the R knee, and 9/10 for the L hip. Continued working on gross hip/ knee strengthening following MTPR techniques focusing on the L glute med/ min.  Practiced sit to stand from elevated table which she did require mod verbal cues/ demonstration for proper form, and min assist with rocking forward. End of session she reported reduced pain and demonstrated improvement in posture with standing/ walking.    REHAB POTENTIAL: Good   CLINICAL DECISION MAKING: Evolving/moderate complexity   EVALUATION COMPLEXITY: Moderate     GOALS: Goals reviewed with patient? Yes   SHORT TERM GOALS:   STG Name Target Date Goal status  1 Pt to be IND with HEP Baseline:  09/19/2021 INITIAL    LONG TERM GOALS:    LTG Name Target Date Goal status  1 Increase gross LE strength to >/= 4/5 to promote hip stability  Baseline: 10/10/2021 INITIAL  2 Pt to be  able to walk for >/= 30 min with LRAD with </= supervision for safety Baseline: 10/10/2021 INITIAL  3 Pt to be able to perform sit to stand with no use of UE >/= 5 x  to demo improvement in funciton/ strength Baseline: 10/10/2021 INITIAL  4 Pt to increase FOTO to >/= 37% to demo improvement in function Baseline: 10/10/2021 INITIAL  5 Pt to reduce TUG by >/= 8 seconds to demo improvement in safety Baseline: initial testing 34 secs 10/10/2021 INITIAL  6 Pt to be IND with all HEP and is able to maintain and progress her current LOF IND Baseline: 10/10/2021 INITIAL    PLAN: PT FREQUENCY: 2x/week   PT DURATION: 6 weeks   PLANNED INTERVENTIONS: Therapeutic exercises, Therapeutic activity, Neuro Muscular re-education, Balance training, Gait training, Patient/Family education, Joint mobilization, Stair training, Aquatic Therapy, Dry Needling, Cryotherapy, Moist heat, Taping, and Manual therapy   PLAN FOR NEXT SESSION: Review/ update HEP PRN,  continue to work on transition  of sit to stand, and general bed mobility, gross hip/ knee strength, core strength and stabilization tasks.    Kieon Lawhorn PT, DPT, LAT, ATC  09/13/21  11:09 AM

## 2021-09-15 ENCOUNTER — Ambulatory Visit: Payer: Medicare Other

## 2021-09-15 ENCOUNTER — Other Ambulatory Visit: Payer: Self-pay

## 2021-09-15 DIAGNOSIS — M6281 Muscle weakness (generalized): Secondary | ICD-10-CM | POA: Diagnosis not present

## 2021-09-15 DIAGNOSIS — G8929 Other chronic pain: Secondary | ICD-10-CM

## 2021-09-15 DIAGNOSIS — M25552 Pain in left hip: Secondary | ICD-10-CM

## 2021-09-15 DIAGNOSIS — M25561 Pain in right knee: Secondary | ICD-10-CM

## 2021-09-15 NOTE — Therapy (Signed)
OUTPATIENT PHYSICAL THERAPY TREATMENT NOTE   Patient Name: Shannon Jensen MRN: [id number] DOB:Mar 15, 1928, 88 y.o., female Today's Date: 09/15/2021  PCP: [name], MD REFERRING PROVIDER: [name], MD   PT End of Session - 09/15/21 1356     Visit Number 4    Number of Visits 13    Date for PT Re-Evaluation 10/10/21    Authorization Type [employer] MCR - KX mod at 15th visit    Progress Note Due on Visit 10    PT Start Time 1355    PT Stop Time 1430    PT Time Calculation (min) 35 min    Activity Tolerance Patient tolerated treatment well    Behavior During Therapy [facility] for tasks assessed/performed             Past Medical History:  Diagnosis Date   Anxiety and depression    Arthritis    Breast cancer ([place]) 08/2008   Right Breast Cancer   Breast cancer ([place]) 2019   Left Breast Cancer   Chronic headaches    Cystocele    Diverticulosis    Family history of breast cancer    Family history of colon cancer    Family history of pancreatic cancer    Family history of prostate cancer    GERD (gastroesophageal reflux disease)    H/O vitamin D deficiency    HLD (hyperlipidemia)    Hypertension    Osteoarthritis    Osteoporosis    Pelvic relaxation    Personal history of radiation therapy 2009   Right Breast Cancer   Pulmonary embolism ([place]) 06/2021   Urinary tract infection 2014   Past Surgical History:  Procedure Laterality Date   [place]   BREAST LUMPECTOMY Right 2009   BREAST LUMPECTOMY Left 2019   BREAST LUMPECTOMY WITH RADIOACTIVE SEED LOCALIZATION Left 11/19/2017   Procedure: BREAST LUMPECTOMY WITH RADIOACTIVE SEED LOCALIZATION;  Surgeon: Rolm Bookbinder, MD;  Location: [place];  Service: General;  Laterality: Left;   BREAST SURGERY  2009   cancer-lymph node removal   NECK SURGERY     cyst removal   Patient Active Problem List   Diagnosis Date Noted   Chest pain at rest 06/13/2021   SVT  (supraventricular tachycardia) ([place])    Acute saddle pulmonary embolism ([place])    Enlarged thyroid 06/23/2019   Genetic testing 10/03/2018   Family history of breast cancer    Family history of pancreatic cancer    Family history of prostate cancer    Presbycusis of both ears 08/25/2018   Laryngopharyngeal reflux (LPR) 06/10/2018   Malignant neoplasm of upper-inner quadrant of left breast in female, estrogen receptor positive ([place]) 09/24/2017   Internal hemorrhoids 09/23/2014   Family history of colon cancer 06/30/2014   History of colonic polyps 06/30/2014   GERD (gastroesophageal reflux disease) 08/27/2012   Diverticulosis    Osteoporosis    Pelvic relaxation    Cystocele    H/O vitamin D deficiency    Hyperlipidemia LDL goal <130 11/08/2008   Essential hypertension 04/13/2008   Osteoarthritis 04/13/2008    REFERRING DIAG: Osteoarthritis, unspecified osteoarthritis type, unspecified site [M19.90]   THERAPY DIAG:  Muscle weakness (generalized)  Pain in left hip  Chronic pain of right knee  PERTINENT HISTORY: Hx of pulmonary embolism, OA, osteoporosis   PRECAUTIONS: Hx of pulmonary embolism, OA, osteoporosis   SUBJECTIVE: Reports continued symptoms and expresses some frustration at continued pain, felt better  after last session but relief was temporary  PAIN:  Are you having pain? Yes VAS scale: 7/10 for the L hip, and 6/10 in the R knee PAIN TYPE: sharp for L hip, sore for the R knee Pain description: constant  Aggravating factors: standing/ walking  Relieving factors: heat, topical ointment, salon pas    OBJECTIVE:   **Measurements captured at evaluation unless otherwise noted** DIAGNOSTIC FINDINGS: N/A   PATIENT SURVEYS:  FOTO 31% function, predicted 37%   COGNITION:          Overall cognitive status: Within functional limits for tasks assessed                POSTURE:  FHP,   PALPATION: R knee TTP along the lateral joint, and along the patellar tendon  and distal hamstrings.  L hip, TTP along the glute med/ minimus with multiple trigger points. Low back L SIJ and lumbar paraspinal tenderness    LE AROM/PROM:   A/PROM Right 08/29/2021 Left 08/29/2021  Hip flexion WNL WNL  Hip extension WNL WNL  Hip abduction WNL WNL  Hip adduction      Hip internal rotation      Hip external rotation      Knee flexion      Knee extension      Ankle dorsiflexion      Ankle plantarflexion      Ankle inversion      Ankle eversion       (Blank rows = not tested)   LE MMT:   MMT Right 08/29/2021 Left 08/29/2021  Hip flexion 4 4-  Hip extension 3+ 3+  Hip abduction 3+ 3+  Hip adduction 3+ 3+  Hip internal rotation      Hip external rotation      Knee flexion 3+ 4-   Knee extension 3+ 4-  Ankle dorsiflexion      Ankle plantarflexion      Ankle inversion      Ankle eversion       (Blank rows = not tested)   LOWER EXTREMITY SPECIAL TESTS:      FUNCTIONAL TESTS:  5 times sit to stand: unable to perform 1 x Timed up and go (TUG): 34 secs   GAIT: Distance walked: into clinic Assistive device utilized: Single point cane Level of assistance: CGA Comments: antalgic, trendelenberg gait pattern with limited stride/ step length bil, HHA for safety          TODAY'S TREATMENT:  [facility] Adult PT Treatment:                                                DATE: 09/15/21 Therapeutic Exercise: Reviewed proper STS technique and need to not brace knees against seat and emphasizing eccentric portion of task Manual piriformis stretch 30sx3, B, performed by PT DKTC over p-ball with OP at end range LTR over p-ball 30sx2 each side LAD to LLE, 3 bouts Self Care: Discussed use of body pillow and [place] knee sleeves for pain control  [facility] Adult PT Treatment:                                                DATE: 09/13/2021 Therapeutic Exercise: LTR 2 x 10  Clamshell 2 x 10 with RTB Supine marching with RTB around knees 2 x 10 (alternating  L/R) Isometric hip adduction ball squeeze 2 x 10 holding 5 x 10 Bridge 1 x 10 with 1 sec glute set  Manual Therapy: MTPR along the glute med x 4 LLE LAD grade II Therapeutic Activity: Sit to stand transition from elevated table 2 x 5 Cues for proper form utilizing nose over toes and being scooted to the end of the table Min assist with rocking forward which greatly improved her standing transition.   [facility] Adult PT Treatment:     DATE: 09/08/2021 Therapeutic Exercise: B hip fallouts, 10x Seated hip and shoulder tosses 10x each, no weight Seated chops, 10x per side, no weight LTR 1 x 10, alt Supine march, alt 10x Supine alternating UE flexion, 10x Seated latissimus press with alternating marching x10 Seated latissimus press with alternating LAQS             PATIENT EDUCATION:                09/13/2020 Education details: Reviewed HEP.  Person educated: Patient Education method: Explanation Education comprehension: verbalized understanding     HOME EXERCISE PROGRAM: Access Code: [email or web address] URL: [email or web address] Date: 09/13/2021 Prepared by: Starr Lake  Exercises Lower Trunk Rotation - 1 x daily - 7 x weekly - 2 sets - 10 reps Hooklying Clamshell with Resistance - 1 x daily - 7 x weekly - 2 sets - 10 reps Seated March - 1 x daily - 7 x weekly - 2 sets - 10 reps Supine Bridge - 1 x daily - 7 x weekly - 2 sets - 10 reps Sit to Stand - 1 x daily - 7 x weekly - 2 sets - 8 reps    ASSESSMENT:   CLINICAL IMPRESSION: Todays session focused on improving flexibility in an effort to relieve patient symptoms and alleviate pain to direct treatment interventions for future sessions.  Reported good response to manual stretching by PT but reminded to continue strengthening tasks at home as well as STS modifications to prevent locking knees   REHAB POTENTIAL: Good   CLINICAL DECISION MAKING: Evolving/moderate complexity   EVALUATION COMPLEXITY: Moderate      GOALS: Goals reviewed with patient? Yes   SHORT TERM GOALS:   STG Name Target Date Goal status  1 Pt to be IND with HEP Baseline:  09/19/2021 INITIAL    LONG TERM GOALS:    LTG Name Target Date Goal status  1 Increase gross LE strength to >/= 4/5 to promote hip stability  Baseline: 10/10/2021 INITIAL  2 Pt to be able to walk for >/= 30 min with LRAD with </= supervision for safety Baseline: 10/10/2021 INITIAL  3 Pt to be able to perform sit to stand with no use of UE >/= 5 x  to demo improvement in funciton/ strength Baseline: 10/10/2021 INITIAL  4 Pt to increase FOTO to >/= 37% to demo improvement in function Baseline: 10/10/2021 INITIAL  5 Pt to reduce TUG by >/= 8 seconds to demo improvement in safety Baseline: initial testing 34 secs 10/10/2021 INITIAL  6 Pt to be IND with all HEP and is able to maintain and progress her current LOF IND Baseline: 10/10/2021 INITIAL    PLAN: PT FREQUENCY: 2x/week   PT DURATION: 6 weeks   PLANNED INTERVENTIONS: Therapeutic exercises, Therapeutic activity, Neuro Muscular re-education, Balance training, Gait training, Patient/Family education, Joint mobilization, Stair training, Aquatic Therapy, Dry Needling, Cryotherapy, Moist heat, Taping,  and Manual therapy   PLAN FOR NEXT SESSION: F/U on stretching results and resume strengthening activities, focus on STS mechanics and functional strengthening tasks/activities   Leroy Sea PT 09/15/21  1:58 PM

## 2021-09-19 ENCOUNTER — Encounter: Payer: Self-pay | Admitting: Physical Therapy

## 2021-09-19 ENCOUNTER — Ambulatory Visit: Payer: Medicare Other | Admitting: Physical Therapy

## 2021-09-19 DIAGNOSIS — G8929 Other chronic pain: Secondary | ICD-10-CM | POA: Diagnosis not present

## 2021-09-19 DIAGNOSIS — M25561 Pain in right knee: Secondary | ICD-10-CM | POA: Diagnosis not present

## 2021-09-19 DIAGNOSIS — M25552 Pain in left hip: Secondary | ICD-10-CM

## 2021-09-19 DIAGNOSIS — M6281 Muscle weakness (generalized): Secondary | ICD-10-CM | POA: Diagnosis not present

## 2021-09-19 NOTE — Therapy (Addendum)
OUTPATIENT PHYSICAL THERAPY TREATMENT NOTE   Patient Name: Shannon Jensen MRN: [id number] DOB:03-Jan-1928, 88 y.o., female Today's Date: 09/19/2021  PCP: [name], MD REFERRING PROVIDER: [name], MD   PT End of Session - 09/19/21 1023     Visit Number 5    Number of Visits 13    Date for PT Re-Evaluation 10/10/21    Authorization Type [employer] MCR - KX mod at 15th visit    Progress Note Due on Visit 10    PT Start Time 1018    Activity Tolerance Patient tolerated treatment well    Behavior During Therapy [facility] for tasks assessed/performed              Past Medical History:  Diagnosis Date   Anxiety and depression    Arthritis    Breast cancer ([employer]) 08/2008   Right Breast Cancer   Breast cancer ([place]) 2019   Left Breast Cancer   Chronic headaches    Cystocele    Diverticulosis    Family history of breast cancer    Family history of colon cancer    Family history of pancreatic cancer    Family history of prostate cancer    GERD (gastroesophageal reflux disease)    H/O vitamin D deficiency    HLD (hyperlipidemia)    Hypertension    Osteoarthritis    Osteoporosis    Pelvic relaxation    Personal history of radiation therapy 2009   Right Breast Cancer   Pulmonary embolism ([place]) 06/2021   Urinary tract infection 2014   Past Surgical History:  Procedure Laterality Date   [place]   BREAST LUMPECTOMY Right 2009   BREAST LUMPECTOMY Left 2019   BREAST LUMPECTOMY WITH RADIOACTIVE SEED LOCALIZATION Left 11/19/2017   Procedure: BREAST LUMPECTOMY WITH RADIOACTIVE SEED LOCALIZATION;  Surgeon: Rolm Bookbinder, MD;  Location: [place];  Service: General;  Laterality: Left;   BREAST SURGERY  2009   cancer-lymph node removal   NECK SURGERY     cyst removal   Patient Active Problem List   Diagnosis Date Noted   Chest pain at rest 06/13/2021   SVT (supraventricular tachycardia) ([place])    Acute saddle pulmonary  embolism ([place])    Enlarged thyroid 06/23/2019   Genetic testing 10/03/2018   Family history of breast cancer    Family history of pancreatic cancer    Family history of prostate cancer    Presbycusis of both ears 08/25/2018   Laryngopharyngeal reflux (LPR) 06/10/2018   Malignant neoplasm of upper-inner quadrant of left breast in female, estrogen receptor positive ([place]) 09/24/2017   Internal hemorrhoids 09/23/2014   Family history of colon cancer 06/30/2014   History of colonic polyps 06/30/2014   GERD (gastroesophageal reflux disease) 08/27/2012   Diverticulosis    Osteoporosis    Pelvic relaxation    Cystocele    H/O vitamin D deficiency    Hyperlipidemia LDL goal <130 11/08/2008   Essential hypertension 04/13/2008   Osteoarthritis 04/13/2008    REFERRING DIAG: Osteoarthritis, unspecified osteoarthritis type, unspecified site [M19.90]   THERAPY DIAG:  Muscle weakness (generalized)  Pain in left hip  Chronic pain of right knee  PERTINENT HISTORY: Hx of pulmonary embolism, OA, osteoporosis   PRECAUTIONS: Hx of pulmonary embolism, OA, osteoporosis   SUBJECTIVE: " I have been doing pretty good, I've been trying to get back into things. It still hurts a lot in th  PAIN:  Are you  having pain? Yes VAS scale: 8/10 Pain location: L low back/ hip, R knee PAIN TYPE: aching Pain description: constant  Aggravating factors: Reaching up Relieving factors:      OBJECTIVE:   **Measurements captured at evaluation unless otherwise noted** DIAGNOSTIC FINDINGS: N/A   PATIENT SURVEYS:  FOTO 31% function, predicted 37%   COGNITION:          Overall cognitive status: Within functional limits for tasks assessed                POSTURE:  FHP,   PALPATION: R knee TTP along the lateral joint, and along the patellar tendon and distal hamstrings.  L hip, TTP along the glute med/ minimus with multiple trigger points. Low back L SIJ and lumbar paraspinal tenderness    LE  AROM/PROM:   A/PROM Right 08/29/2021 Left 08/29/2021  Hip flexion WNL WNL  Hip extension WNL WNL  Hip abduction WNL WNL  Hip adduction      Hip internal rotation      Hip external rotation      Knee flexion      Knee extension      Ankle dorsiflexion      Ankle plantarflexion      Ankle inversion      Ankle eversion       (Blank rows = not tested)   LE MMT:   MMT Right 08/29/2021 Left 08/29/2021  Hip flexion 4 4-  Hip extension 3+ 3+  Hip abduction 3+ 3+  Hip adduction 3+ 3+  Hip internal rotation      Hip external rotation      Knee flexion 3+ 4-   Knee extension 3+ 4-  Ankle dorsiflexion      Ankle plantarflexion      Ankle inversion      Ankle eversion       (Blank rows = not tested)   LOWER EXTREMITY SPECIAL TESTS:      FUNCTIONAL TESTS:  5 times sit to stand: unable to perform 1 x Timed up and go (TUG): 34 secs   GAIT: Distance walked: into clinic Assistive device utilized: Single point cane Level of assistance: CGA Comments: antalgic, trendelenberg gait pattern with limited stride/ step length bil, HHA for safety          TODAY'S TREATMENT:   [facility] Adult PT Treatment:                                                DATE: 09/19/2021  Therapeutic Exercise: L  hip clamhsell in R sidelying 2 x 15 L hip abduction 2 x 10 L hip adduction isometric squeeze 2 x 10 holding 5 seconds Manual Therapy: MTPR along the L glute med/ min x 4 DTM along the L glute med/ min in R sidelying Neuromuscular re-ed: Standing reachjing up the wall with RUE to promote L glute med activation 2 x10 Verbal/ tactile cues for proper form Therapeutic Activity: Sit to stand from elevated table 2 x 10 (lowered table 1 inch between sets) Continued cues required for rocking forward and reaching.   [facility] Adult PT Treatment:  DATE: 09/15/21 Therapeutic Exercise: Reviewed proper STS technique and need to not brace knees against seat and  emphasizing eccentric portion of task Manual piriformis stretch 30sx3, B, performed by PT DKTC over p-ball with OP at end range LTR over p-ball 30sx2 each side LAD to LLE, 3 bouts Self Care: Discussed use of body pillow and [place] knee sleeves for pain control  [facility] Adult PT Treatment:                                                DATE: 09/13/2021 Therapeutic Exercise: LTR 2 x 10 Clamshell 2 x 10 with RTB Supine marching with RTB around knees 2 x 10 (alternating L/R) Isometric hip adduction ball squeeze 2 x 10 holding 5 x 10 Bridge 1 x 10 with 1 sec glute set  Manual Therapy: MTPR along the glute med x 4 LLE LAD grade II Therapeutic Activity: Sit to stand transition from elevated table 2 x 5 Cues for proper form utilizing nose over toes and being scooted to the end of the table Min assist with rocking forward which greatly improved her standing transition.   [facility] Adult PT Treatment:     DATE: 09/08/2021 Therapeutic Exercise: B hip fallouts, 10x Seated hip and shoulder tosses 10x each, no weight Seated chops, 10x per side, no weight LTR 1 x 10, alt Supine march, alt 10x Supine alternating UE flexion, 10x Seated latissimus press with alternating marching x10 Seated latissimus press with alternating LAQS             PATIENT EDUCATION:                09/19/2021 Education details: Reviewed HEP and updated today  Person educated: Patient Education method: Explanation Education comprehension: verbalized understanding     HOME EXERCISE PROGRAM: Access Code: [email or web address] URL: [email or web address] Date: 09/19/2021 Prepared by: Starr Lake  Exercises Lower Trunk Rotation - 1 x daily - 7 x weekly - 2 sets - 10 reps Hooklying Clamshell with Resistance - 1 x daily - 7 x weekly - 2 sets - 10 reps Seated March - 1 x daily - 7 x weekly - 2 sets - 10 reps Supine Bridge - 1 x daily - 7 x weekly - 2 sets - 10 reps Sit to Stand - 1 x daily - 7 x weekly - 2 sets  - 8 reps Sidelying Hip Abduction - 1 x daily - 7 x weekly - 3 sets - 10 reps Seated Hip Adduction Isometrics with Ball - 1 x daily - 7 x weekly - 2 sets - 10 reps - 5 seconds hold     ASSESSMENT:   CLINICAL IMPRESSION: Pt arrives to session today reporting continued L hip and R knee pain which she rated at 8/10 but doesn't demonstrate any visual indicators of that level of pain. Continued MTPR along the L glute med/ min followed with gross hip strengthening. She did very well and was able to progress to sidelying hip abduction today. End of session she reported pain dropped from a 8/10 to a 5/10.    REHAB POTENTIAL: Good   CLINICAL DECISION MAKING: Evolving/moderate complexity   EVALUATION COMPLEXITY: Moderate     GOALS: Goals reviewed with patient? Yes   SHORT TERM GOALS:   STG Name Target Date Goal status  1 Pt to be IND with HEP Baseline:  09/19/2021 INITIAL    LONG TERM GOALS:    LTG Name Target Date Goal status  1 Increase gross LE strength to >/= 4/5 to promote hip stability  Baseline: 10/10/2021 INITIAL  2 Pt to be able to walk for >/= 30 min with LRAD with </= supervision for safety Baseline: 10/10/2021 INITIAL  3 Pt to be able to perform sit to stand with no use of UE >/= 5 x  to demo improvement in funciton/ strength Baseline: 10/10/2021 INITIAL  4 Pt to increase FOTO to >/= 37% to demo improvement in function Baseline: 10/10/2021 INITIAL  5 Pt to reduce TUG by >/= 8 seconds to demo improvement in safety Baseline: initial testing 34 secs 10/10/2021 INITIAL  6 Pt to be IND with all HEP and is able to maintain and progress her current LOF IND Baseline: 10/10/2021 INITIAL    PLAN: PT FREQUENCY: 2x/week   PT DURATION: 6 weeks   PLANNED INTERVENTIONS: Therapeutic exercises, Therapeutic activity, Neuro Muscular re-education, Balance training, Gait training, Patient/Family education, Joint mobilization, Stair training, Aquatic Therapy, Dry Needling, Cryotherapy, Moist  heat, Taping, and Manual therapy   PLAN FOR NEXT SESSION: F/U on stretching results and resume strengthening activities, focus on STS mechanics and functional strengthening tasks/activities. STAIR TRAINING.   Julliette Frentz PT, DPT, LAT, ATC  09/19/21  11:02 AM

## 2021-09-21 ENCOUNTER — Ambulatory Visit: Payer: Medicare Other | Admitting: Physical Therapy

## 2021-09-21 ENCOUNTER — Encounter: Payer: Self-pay | Admitting: Physical Therapy

## 2021-09-21 ENCOUNTER — Other Ambulatory Visit: Payer: Self-pay

## 2021-09-21 ENCOUNTER — Telehealth: Payer: Self-pay | Admitting: Interventional Cardiology

## 2021-09-21 DIAGNOSIS — M6281 Muscle weakness (generalized): Secondary | ICD-10-CM

## 2021-09-21 DIAGNOSIS — G8929 Other chronic pain: Secondary | ICD-10-CM | POA: Diagnosis not present

## 2021-09-21 DIAGNOSIS — M25561 Pain in right knee: Secondary | ICD-10-CM | POA: Diagnosis not present

## 2021-09-21 DIAGNOSIS — M25552 Pain in left hip: Secondary | ICD-10-CM | POA: Diagnosis not present

## 2021-09-21 NOTE — Telephone Encounter (Signed)
Patient was in the office today and want to know if it's ok to walk the stairs now.

## 2021-09-21 NOTE — Telephone Encounter (Signed)
Left message to call back  

## 2021-09-21 NOTE — Therapy (Signed)
OUTPATIENT PHYSICAL THERAPY TREATMENT NOTE   Patient Name: Shannon Jensen MRN: [id number] DOB:October 12, 1927, 88 y.o., female Today's Date: 09/21/2021  PCP: Shannon Lung, MD REFERRING PROVIDER: [name], MD   PT End of Session - 09/21/21 1020     Visit Number 6    Number of Visits 13    Date for PT Re-Evaluation 10/10/21    Authorization Type [employer] MCR - KX mod at 15th visit    Progress Note Due on Visit 10    PT Start Time 4403    PT Stop Time 1059    PT Time Calculation (min) 44 min    Activity Tolerance Patient tolerated treatment well    Behavior During Therapy [facility] for tasks assessed/performed               Past Medical History:  Diagnosis Date   Anxiety and depression    Arthritis    Breast cancer ([place]) 08/2008   Right Breast Cancer   Breast cancer ([place]) 2019   Left Breast Cancer   Chronic headaches    Cystocele    Diverticulosis    Family history of breast cancer    Family history of colon cancer    Family history of pancreatic cancer    Family history of prostate cancer    GERD (gastroesophageal reflux disease)    H/O vitamin D deficiency    HLD (hyperlipidemia)    Hypertension    Osteoarthritis    Osteoporosis    Pelvic relaxation    Personal history of radiation therapy 2009   Right Breast Cancer   Pulmonary embolism ([place]) 06/2021   Urinary tract infection 2014   Past Surgical History:  Procedure Laterality Date   [place]   BREAST LUMPECTOMY Right 2009   BREAST LUMPECTOMY Left 2019   BREAST LUMPECTOMY WITH RADIOACTIVE SEED LOCALIZATION Left 11/19/2017   Procedure: BREAST LUMPECTOMY WITH RADIOACTIVE SEED LOCALIZATION;  Surgeon: Shannon Bookbinder, MD;  Location: [place];  Service: General;  Laterality: Left;   BREAST SURGERY  2009   cancer-lymph node removal   NECK SURGERY     cyst removal   Patient Active Problem List   Diagnosis Date Noted   Chest pain at rest 06/13/2021   SVT  (supraventricular tachycardia) ([place])    Acute saddle pulmonary embolism ([place])    Enlarged thyroid 06/23/2019   Genetic testing 10/03/2018   Family history of breast cancer    Family history of pancreatic cancer    Family history of prostate cancer    Presbycusis of both ears 08/25/2018   Laryngopharyngeal reflux (LPR) 06/10/2018   Malignant neoplasm of upper-inner quadrant of left breast in female, estrogen receptor positive ([place]) 09/24/2017   Internal hemorrhoids 09/23/2014   Family history of colon cancer 06/30/2014   History of colonic polyps 06/30/2014   GERD (gastroesophageal reflux disease) 08/27/2012   Diverticulosis    Osteoporosis    Pelvic relaxation    Cystocele    H/O vitamin D deficiency    Hyperlipidemia LDL goal <130 11/08/2008   Essential hypertension 04/13/2008   Osteoarthritis 04/13/2008    REFERRING DIAG: Osteoarthritis, unspecified osteoarthritis type, unspecified site [M19.90]   THERAPY DIAG:  Muscle weakness (generalized)  Pain in left hip  Chronic pain of right knee  PERTINENT HISTORY: Hx of pulmonary embolism, OA, osteoporosis   PRECAUTIONS: Hx of pulmonary embolism, OA, osteoporosis   SUBJECTIVE: " The knee and the hip are still giving me  issues.  PAIN:  Are you having pain? Yes VAS scale: 9/10 Pain location: R knee and l hip Pain orientation: Right and Left  PAIN TYPE: aching Pain description: intermittent  Aggravating factors: standing and walking  Relieving factors: unsure      OBJECTIVE:   **Measurements captured at evaluation unless otherwise noted** DIAGNOSTIC FINDINGS: N/A   PATIENT SURVEYS:  FOTO 31% function, predicted 37%   09/21/2021       FOTO 39%    COGNITION:          Overall cognitive status: Within functional limits for tasks assessed                POSTURE:  FHP,   PALPATION: R knee TTP along the lateral joint, and along the patellar tendon and distal hamstrings.  L hip, TTP along the glute med/ minimus with  multiple trigger points. Low back L SIJ and lumbar paraspinal tenderness    LE AROM/PROM:   A/PROM Right 08/29/2021 Left 08/29/2021  Hip flexion WNL WNL  Hip extension WNL WNL  Hip abduction WNL WNL  Hip adduction      Hip internal rotation      Hip external rotation      Knee flexion      Knee extension      Ankle dorsiflexion      Ankle plantarflexion      Ankle inversion      Ankle eversion       (Blank rows = not tested)   LE MMT:   MMT Right 08/29/2021 Left 08/29/2021  Hip flexion 4 4-  Hip extension 3+ 3+  Hip abduction 3+ 3+  Hip adduction 3+ 3+  Hip internal rotation      Hip external rotation      Knee flexion 3+ 4-   Knee extension 3+ 4-  Ankle dorsiflexion      Ankle plantarflexion      Ankle inversion      Ankle eversion       (Blank rows = not tested)   LOWER EXTREMITY SPECIAL TESTS:      FUNCTIONAL TESTS:  5 times sit to stand: unable to perform 1 x Timed up and go (TUG): 34 secs   GAIT: Distance walked: into clinic Assistive device utilized: Single point cane Level of assistance: CGA Comments: antalgic, trendelenberg gait pattern with limited stride/ step length bil, HHA for safety          TODAY'S TREATMENT:  [facility] Adult PT Treatment:                                                DATE: 09/21/2021  Therapeutic Exercise: Sit to stand 1 x 5 holding 5# kettlebell Progressed to with ball between knees to reduce L lateral weight shift 1 x 5 Therapeutic Activity: Navigating stairs with SPC on 6 inch steps. Initially x 2 with SPC in R UE. On the second attempt at the top of the stairs she did buckle which required min assist from PT via Gait belt. Moved the [facility] to the LUE and she significantly improved her mobility and stability.  Practiced gait training with the [place] in the LUE x 30 ft and demonstrated improved cadence and stability but required cues for shorter stride.    [facility] Adult PT Treatment:  DATE: 09/19/2021  Therapeutic Exercise: L  hip clamhsell in R sidelying 2 x 15 L hip abduction 2 x 10 L hip adduction isometric squeeze 2 x 10 holding 5 seconds Manual Therapy: MTPR along the L glute med/ min x 4 DTM along the L glute med/ min in R sidelying Neuromuscular re-ed: Standing reachjing up the wall with RUE to promote L glute med activation 2 x10 Verbal/ tactile cues for proper form Therapeutic Activity: Sit to stand from elevated table 2 x 10 (lowered table 1 inch between sets) Continued cues required for rocking forward and reaching.   [facility] Adult PT Treatment:                                                DATE: 09/15/21 Therapeutic Exercise: Reviewed proper STS technique and need to not brace knees against seat and emphasizing eccentric portion of task Manual piriformis stretch 30sx3, B, performed by PT DKTC over p-ball with OP at end range LTR over p-ball 30sx2 each side LAD to LLE, 3 bouts Self Care: Discussed use of body pillow and [place] knee sleeves for pain control  [facility] Adult PT Treatment:                                                DATE: 09/13/2021 Therapeutic Exercise: LTR 2 x 10 Clamshell 2 x 10 with RTB Supine marching with RTB around knees 2 x 10 (alternating L/R) Isometric hip adduction ball squeeze 2 x 10 holding 5 x 10 Bridge 1 x 10 with 1 sec glute set  Manual Therapy: MTPR along the glute med x 4 LLE LAD grade II Therapeutic Activity: Sit to stand transition from elevated table 2 x 5 Cues for proper form utilizing nose over toes and being scooted to the end of the table Min assist with rocking forward which greatly improved her standing transition.   [facility] Adult PT Treatment:     DATE: 09/08/2021 Therapeutic Exercise: B hip fallouts, 10x Seated hip and shoulder tosses 10x each, no weight Seated chops, 10x per side, no weight LTR 1 x 10, alt Supine march, alt 10x Supine alternating UE flexion, 10x Seated latissimus press with  alternating marching x10 Seated latissimus press with alternating LAQS             PATIENT EDUCATION:                09/19/2021 Education details: Reviewed HEP and updated today  Person educated: Patient Education method: Explanation Education comprehension: verbalized understanding     HOME EXERCISE PROGRAM: Access Code: [email or web address] URL: [email or web address] Date: 09/19/2021 Prepared by: Starr Lake  Exercises Lower Trunk Rotation - 1 x daily - 7 x weekly - 2 sets - 10 reps Hooklying Clamshell with Resistance - 1 x daily - 7 x weekly - 2 sets - 10 reps Seated March - 1 x daily - 7 x weekly - 2 sets - 10 reps Supine Bridge - 1 x daily - 7 x weekly - 2 sets - 10 reps Sit to Stand - 1 x daily - 7 x weekly - 2 sets - 8 reps Sidelying Hip Abduction - 1 x daily - 7 x weekly - 3 sets - 10 reps  Seated Hip Adduction Isometrics with Ball - 1 x daily - 7 x weekly - 2 sets - 10 reps - 5 seconds hold     ASSESSMENT:   CLINICAL IMPRESSION: Mrs Mckinze reports she does well following the previous sessions but states the pain returns reporting pain is at a 9/10 in both the R knee and L hip. Focused on functional activity today with stair training which she did require min assist x 1 with use of gait belt due to her knees buckling but was able to regain standing and complete the activity. Modified using her SPC in the LUE which greatly improved her stability, gait and when navigating steps. Throughout session she exhibited no visual indications that would be consistent with an elevated level of pain.    REHAB POTENTIAL: Good   CLINICAL DECISION MAKING: Evolving/moderate complexity   EVALUATION COMPLEXITY: Moderate     GOALS: Goals reviewed with patient? Yes   SHORT TERM GOALS:   STG Name Target Date Goal status  1 Pt to be IND with HEP Baseline:  09/19/2021 INITIAL    LONG TERM GOALS:    LTG Name Target Date Goal status  1 Increase gross LE strength to >/=  4/5 to promote hip stability  Baseline: 10/10/2021 INITIAL  2 Pt to be able to walk for >/= 30 min with LRAD with </= supervision for safety Baseline: 10/10/2021 INITIAL  3 Pt to be able to perform sit to stand with no use of UE >/= 5 x  to demo improvement in funciton/ strength Baseline: 10/10/2021 INITIAL  4 Pt to increase FOTO to >/= 37% to demo improvement in function Baseline: 10/10/2021 INITIAL  5 Pt to reduce TUG by >/= 8 seconds to demo improvement in safety Baseline: initial testing 34 secs 10/10/2021 INITIAL  6 Pt to be IND with all HEP and is able to maintain and progress her current LOF IND Baseline: 10/10/2021 INITIAL    PLAN: PT FREQUENCY: 2x/week   PT DURATION: 6 weeks   PLANNED INTERVENTIONS: Therapeutic exercises, Therapeutic activity, Neuro Muscular re-education, Balance training, Gait training, Patient/Family education, Joint mobilization, Stair training, Aquatic Therapy, Dry Needling, Cryotherapy, Moist heat, Taping, and Manual therapy   PLAN FOR NEXT SESSION: F/U on stretching results and resume strengthening activities, focus on STS mechanics and functional strengthening tasks/activities. STAIR TRAINING. Continued gait/ stair training with SPC in LUE. Practiced sit to stand avoiding L lateral weight shift.   Sharniece Gibbon PT, DPT, LAT, ATC  09/21/21  11:56 AM

## 2021-09-22 NOTE — Telephone Encounter (Signed)
Patient returned your call.

## 2021-09-22 NOTE — Telephone Encounter (Signed)
Pt is planning to visit a family member but wanted to know if we felt it was ok for her to climb about 8-10 stairs to get to her apartment.  Made  pt aware that this would be fine but to just be mindful if she starts to feel her heart race or she gets dizzy to stop.  Pt verbalized understanding and was appreciative for call.

## 2021-09-26 ENCOUNTER — Ambulatory Visit: Payer: Medicare Other | Admitting: Physical Therapy

## 2021-09-26 ENCOUNTER — Encounter: Payer: Self-pay | Admitting: Physical Therapy

## 2021-09-26 ENCOUNTER — Other Ambulatory Visit: Payer: Self-pay

## 2021-09-26 DIAGNOSIS — G8929 Other chronic pain: Secondary | ICD-10-CM | POA: Diagnosis not present

## 2021-09-26 DIAGNOSIS — M25552 Pain in left hip: Secondary | ICD-10-CM

## 2021-09-26 DIAGNOSIS — M6281 Muscle weakness (generalized): Secondary | ICD-10-CM

## 2021-09-26 DIAGNOSIS — M25561 Pain in right knee: Secondary | ICD-10-CM | POA: Diagnosis not present

## 2021-09-26 NOTE — Therapy (Signed)
OUTPATIENT PHYSICAL THERAPY TREATMENT NOTE   Patient Name: Shannon Jensen MRN: [id number] DOB:July 10, 1928, 88 y.o., female Today's Date: 09/26/2021  PCP: [name], MD REFERRING PROVIDER: [name], MD   PT End of Session - 09/26/21 1018     Visit Number 7    Number of Visits 13    Date for PT Re-Evaluation 10/10/21    Authorization Type [employer] MCR - KX mod at 15th visit    Progress Note Due on Visit 10    PT Start Time 1017    Activity Tolerance Patient tolerated treatment well    Behavior During Therapy [facility] for tasks assessed/performed                Past Medical History:  Diagnosis Date   Anxiety and depression    Arthritis    Breast cancer ([place]) 08/2008   Right Breast Cancer   Breast cancer ([place]) 2019   Left Breast Cancer   Chronic headaches    Cystocele    Diverticulosis    Family history of breast cancer    Family history of colon cancer    Family history of pancreatic cancer    Family history of prostate cancer    GERD (gastroesophageal reflux disease)    H/O vitamin D deficiency    HLD (hyperlipidemia)    Hypertension    Osteoarthritis    Osteoporosis    Pelvic relaxation    Personal history of radiation therapy 2009   Right Breast Cancer   Pulmonary embolism ([place]) 06/2021   Urinary tract infection 2014   Past Surgical History:  Procedure Laterality Date   [place]   BREAST LUMPECTOMY Right 2009   BREAST LUMPECTOMY Left 2019   BREAST LUMPECTOMY WITH RADIOACTIVE SEED LOCALIZATION Left 11/19/2017   Procedure: BREAST LUMPECTOMY WITH RADIOACTIVE SEED LOCALIZATION;  Surgeon: Rolm Bookbinder, MD;  Location: [facility];  Service: General;  Laterality: Left;   BREAST SURGERY  2009   cancer-lymph node removal   NECK SURGERY     cyst removal   Patient Active Problem List   Diagnosis Date Noted   Chest pain at rest 06/13/2021   SVT (supraventricular tachycardia) ([place])    Acute saddle pulmonary  embolism ([place])    Enlarged thyroid 06/23/2019   Genetic testing 10/03/2018   Family history of breast cancer    Family history of pancreatic cancer    Family history of prostate cancer    Presbycusis of both ears 08/25/2018   Laryngopharyngeal reflux (LPR) 06/10/2018   Malignant neoplasm of upper-inner quadrant of left breast in female, estrogen receptor positive ([place]) 09/24/2017   Internal hemorrhoids 09/23/2014   Family history of colon cancer 06/30/2014   History of colonic polyps 06/30/2014   GERD (gastroesophageal reflux disease) 08/27/2012   Diverticulosis    Osteoporosis    Pelvic relaxation    Cystocele    H/O vitamin D deficiency    Hyperlipidemia LDL goal <130 11/08/2008   Essential hypertension 04/13/2008   Osteoarthritis 04/13/2008    REFERRING DIAG: Osteoarthritis, unspecified osteoarthritis type, unspecified site [M19.90]   THERAPY DIAG:  Muscle weakness (generalized)  Pain in left hip  Chronic pain of right knee  PERTINENT HISTORY: Hx of pulmonary embolism, OA, osteoporosis   PRECAUTIONS: Hx of pulmonary embolism, OA, osteoporosis   SUBJECTIVE: "I've been doing better, the pain is improving, pain is a 7/10."  PAIN:  Are you having pain? Yes VAS scale: 7/10 Pain location:  L hip and R knee PAIN TYPE: aching and Sore Pain description: intermittent and constant  Aggravating factors: prolonged standing/ walking Relieving factors: sitting and resting.        OBJECTIVE:   **Measurements captured at evaluation unless otherwise noted** DIAGNOSTIC FINDINGS: N/A   PATIENT SURVEYS:  FOTO 31% function, predicted 37%   09/21/2021       FOTO 39%    COGNITION:          Overall cognitive status: Within functional limits for tasks assessed                POSTURE:  FHP,   PALPATION: R knee TTP along the lateral joint, and along the patellar tendon and distal hamstrings.  L hip, TTP along the glute med/ minimus with multiple trigger points. Low back L  SIJ and lumbar paraspinal tenderness    LE AROM/PROM:   A/PROM Right 08/29/2021 Left 08/29/2021  Hip flexion WNL WNL  Hip extension WNL WNL  Hip abduction WNL WNL  Hip adduction      Hip internal rotation      Hip external rotation      Knee flexion      Knee extension      Ankle dorsiflexion      Ankle plantarflexion      Ankle inversion      Ankle eversion       (Blank rows = not tested)   LE MMT:   MMT Right 08/29/2021 Left 08/29/2021  Hip flexion 4 4-  Hip extension 3+ 3+  Hip abduction 3+ 3+  Hip adduction 3+ 3+  Hip internal rotation      Hip external rotation      Knee flexion 3+ 4-   Knee extension 3+ 4-  Ankle dorsiflexion      Ankle plantarflexion      Ankle inversion      Ankle eversion       (Blank rows = not tested)   LOWER EXTREMITY SPECIAL TESTS:      FUNCTIONAL TESTS:  5 times sit to stand: unable to perform 1 x Timed up and go (TUG): 34 secs   GAIT: Distance walked: into clinic Assistive device utilized: Single point cane Level of assistance: CGA Comments: antalgic, trendelenberg gait pattern with limited stride/ step length bil, HHA for safety         TODAY'S TREATMENT:  [facility] Adult PT Treatment:                                                DATE: 09/26/2021 Therapeutic Exercise: Nu-step L5 x 5 min UE/LE Sit to stand 2 x 10 holding 5# Kettlebell  Will ball between knees to reduce R knee valgus Standing hip abduction 2 x 12 bil  Seated LAQ on the R 2 x 12 with 3# while performing active ball squeeze    [facility] Adult PT Treatment:                                                DATE: 09/21/2021 Therapeutic Exercise: Sit to stand 1 x 5 holding 5# kettlebell Progressed to with ball between knees to reduce L lateral weight shift 1 x 5 Therapeutic Activity: Navigating  stairs with SPC on 6 inch steps. Initially x 2 with SPC in R UE. On the second attempt at the top of the stairs she did buckle which required min assist from PT via Gait  belt. Moved the [facility] to the LUE and she significantly improved her mobility and stability.  Practiced gait training with the [place] in the LUE x 30 ft and demonstrated improved cadence and stability but required cues for shorter stride.    [facility] Adult PT Treatment:                                                DATE: 09/19/2021 Therapeutic Exercise: L  hip clamhsell in R sidelying 2 x 15 L hip abduction 2 x 10 L hip adduction isometric squeeze 2 x 10 holding 5 seconds Manual Therapy: MTPR along the L glute med/ min x 4 DTM along the L glute med/ min in R sidelying Neuromuscular re-ed: Standing reachjing up the wall with RUE to promote L glute med activation 2 x10 Verbal/ tactile cues for proper form Therapeutic Activity: Sit to stand from elevated table 2 x 10 (lowered table 1 inch between sets) Continued cues required for rocking forward and reaching.   [facility] Adult PT Treatment:                                                DATE: 09/15/21 Therapeutic Exercise: Reviewed proper STS technique and need to not brace knees against seat and emphasizing eccentric portion of task Manual piriformis stretch 30sx3, B, performed by PT DKTC over p-ball with OP at end range LTR over p-ball 30sx2 each side LAD to LLE, 3 bouts Self Care: Discussed use of body pillow and [place] knee sleeves for pain control  [facility] Adult PT Treatment:                                                DATE: 09/13/2021 Therapeutic Exercise: LTR 2 x 10 Clamshell 2 x 10 with RTB Supine marching with RTB around knees 2 x 10 (alternating L/R) Isometric hip adduction ball squeeze 2 x 10 holding 5 x 10 Bridge 1 x 10 with 1 sec glute set  Manual Therapy: MTPR along the glute med x 4 LLE LAD grade II Therapeutic Activity: Sit to stand transition from elevated table 2 x 5 Cues for proper form utilizing nose over toes and being scooted to the end of the table Min assist with rocking forward which greatly improved her standing  transition.         PATIENT EDUCATION:                1/17/202 Education details: Reviewed HEP and updated today  Person educated: Patient Education method: Explanation Education comprehension: verbalized understanding     HOME EXERCISE PROGRAM: Access Code: [email or web address] URL: [email or web address] Date: 09/26/2021 Prepared by: Starr Lake  Exercises Lower Trunk Rotation - 1 x daily - 7 x weekly - 2 sets - 10 reps Hooklying Clamshell with Resistance - 1 x daily - 7 x weekly - 2 sets - 10 reps Seated  March - 1 x daily - 7 x weekly - 2 sets - 10 reps Supine Bridge - 1 x daily - 7 x weekly - 2 sets - 10 reps Sit to Stand - 1 x daily - 7 x weekly - 2 sets - 8 reps Sidelying Hip Abduction - 1 x daily - 7 x weekly - 3 sets - 10 reps Seated Hip Adduction Isometrics with Ball - 1 x daily - 7 x weekly - 2 sets - 10 reps - 5 seconds hold Standing Hip Abduction with Counter Support - 1 x daily - 7 x weekly - 2 sets - 12 reps      ASSESSMENT:   CLINICAL IMPRESSION:  Mrs Wenke reports 7/10 pain today which is an improvement over the course of the last few visits. Continued focus on gross hip strengthening which she did well with. Progressed to standing hip strengthening which was given as her HEP. Plan to drop down to 1 a week as she is making good progress to work on begin tapering formal PT visits.    REHAB POTENTIAL: Good   CLINICAL DECISION MAKING: Evolving/moderate complexity   EVALUATION COMPLEXITY: Moderate     GOALS: Goals reviewed with patient? Yes   SHORT TERM GOALS:   STG Name Target Date Goal status  1 Pt to be IND with HEP Baseline:  09/19/2021 Met 09/26/2021    LONG TERM GOALS:    LTG Name Target Date Goal status  1 Increase gross LE strength to >/= 4/5 to promote hip stability  Baseline: 10/10/2021 INITIAL  2 Pt to be able to walk for >/= 30 min with LRAD with </= supervision for safety Baseline: 10/10/2021 INITIAL  3 Pt to be able to perform  sit to stand with no use of UE >/= 5 x  to demo improvement in funciton/ strength Baseline: 10/10/2021 INITIAL  4 Pt to increase FOTO to >/= 37% to demo improvement in function Baseline: 10/10/2021 INITIAL  5 Pt to reduce TUG by >/= 8 seconds to demo improvement in safety Baseline: initial testing 34 secs 10/10/2021 INITIAL  6 Pt to be IND with all HEP and is able to maintain and progress her current LOF IND Baseline: 10/10/2021 INITIAL    PLAN: PT FREQUENCY: 2x/week   PT DURATION: 6 weeks   PLANNED INTERVENTIONS: Therapeutic exercises, Therapeutic activity, Neuro Muscular re-education, Balance training, Gait training, Patient/Family education, Joint mobilization, Stair training, Aquatic Therapy, Dry Needling, Cryotherapy, Moist heat, Taping, and Manual therapy   PLAN FOR NEXT SESSION: F/U on stretching results and resume strengthening activities, focus on STS mechanics and functional strengthening tasks/activities. Continued gait/ stair training with SPC in LUE. Practiced sit to stand avoiding L lateral weight shift. Standing endurancing training, reduce R knee valgus  Breea Loncar PT, DPT, LAT, ATC  09/26/21  11:07 AM

## 2021-09-27 ENCOUNTER — Ambulatory Visit: Payer: Self-pay | Admitting: *Deleted

## 2021-09-27 ENCOUNTER — Telehealth: Payer: Self-pay | Admitting: *Deleted

## 2021-09-27 NOTE — Patient Outreach (Signed)
°  Care Management   Follow Up Note   09/27/2021 Name: FAITHANN NATAL MRN: [id number] DOB: 02/16/1928   Referred by: [name], MD  Reason for referral : Care Coordination Needs with Regards to Long-Term Care Placement.   An unsuccessful telephone outreach was attempted today. The patient was referred to the case management team for assistance with care management and care coordination. HIPAA compliant messages were left on voicemail, for both patient and daughter, [name], providing contact information, encouraging them to return LCSW's call at their earliest convenience.  LCSW will make a second follow-up telephone outreach call attempt to patient within the next 5-7 business days, if a return call is not received from patient and/or Ms. Dimas Millin in the meantime.  Follow-Up Plan:  10/03/2021 at 12:45 pm  Nat Christen, BSW, MSW, [place]  Licensed Clinical Social Worker  [place]  [employer]ailing [place]. [address] [place], [place] [address] Physical Address-[address], [employer], [place] [address] Toll Free Main # [phone] Fax # [phone] Cell # [phone]  [email or web address]

## 2021-09-28 ENCOUNTER — Ambulatory Visit: Payer: Medicare Other | Admitting: Physical Therapy

## 2021-09-28 ENCOUNTER — Encounter: Payer: Self-pay | Admitting: Physical Therapy

## 2021-09-28 ENCOUNTER — Other Ambulatory Visit: Payer: Self-pay

## 2021-09-28 DIAGNOSIS — G8929 Other chronic pain: Secondary | ICD-10-CM

## 2021-09-28 DIAGNOSIS — M25552 Pain in left hip: Secondary | ICD-10-CM | POA: Diagnosis not present

## 2021-09-28 DIAGNOSIS — M6281 Muscle weakness (generalized): Secondary | ICD-10-CM

## 2021-09-28 DIAGNOSIS — M25561 Pain in right knee: Secondary | ICD-10-CM | POA: Diagnosis not present

## 2021-09-28 NOTE — Therapy (Signed)
OUTPATIENT PHYSICAL THERAPY TREATMENT NOTE   Patient Name: Shannon Jensen MRN: [id number] DOB:01-Mar-1928, 88 y.o., female Today's Date: 09/28/2021  PCP: [name], MD REFERRING PROVIDER: [name], MD   PT End of Session - 09/28/21 1103     Visit Number 8    Number of Visits 13    Date for PT Re-Evaluation 10/10/21    Authorization Type [employer] MCR - KX mod at 15th visit    Progress Note Due on Visit 10    PT Start Time 1103    PT Stop Time 9147    PT Time Calculation (min) 45 min                 Past Medical History:  Diagnosis Date   Anxiety and depression    Arthritis    Breast cancer ([place]) 08/2008   Right Breast Cancer   Breast cancer ([place]) 2019   Left Breast Cancer   Chronic headaches    Cystocele    Diverticulosis    Family history of breast cancer    Family history of colon cancer    Family history of pancreatic cancer    Family history of prostate cancer    GERD (gastroesophageal reflux disease)    H/O vitamin D deficiency    HLD (hyperlipidemia)    Hypertension    Osteoarthritis    Osteoporosis    Pelvic relaxation    Personal history of radiation therapy 2009   Right Breast Cancer   Pulmonary embolism ([place]) 06/2021   Urinary tract infection 2014   Past Surgical History:  Procedure Laterality Date   [place]   BREAST LUMPECTOMY Right 2009   BREAST LUMPECTOMY Left 2019   BREAST LUMPECTOMY WITH RADIOACTIVE SEED LOCALIZATION Left 11/19/2017   Procedure: BREAST LUMPECTOMY WITH RADIOACTIVE SEED LOCALIZATION;  Surgeon: Rolm Bookbinder, MD;  Location: [facility];  Service: General;  Laterality: Left;   BREAST SURGERY  2009   cancer-lymph node removal   NECK SURGERY     cyst removal   Patient Active Problem List   Diagnosis Date Noted   Chest pain at rest 06/13/2021   SVT (supraventricular tachycardia) ([place])    Acute saddle pulmonary embolism ([place])    Enlarged thyroid 06/23/2019   Genetic  testing 10/03/2018   Family history of breast cancer    Family history of pancreatic cancer    Family history of prostate cancer    Presbycusis of both ears 08/25/2018   Laryngopharyngeal reflux (LPR) 06/10/2018   Malignant neoplasm of upper-inner quadrant of left breast in female, estrogen receptor positive ([place]) 09/24/2017   Internal hemorrhoids 09/23/2014   Family history of colon cancer 06/30/2014   History of colonic polyps 06/30/2014   GERD (gastroesophageal reflux disease) 08/27/2012   Diverticulosis    Osteoporosis    Pelvic relaxation    Cystocele    H/O vitamin D deficiency    Hyperlipidemia LDL goal <130 11/08/2008   Essential hypertension 04/13/2008   Osteoarthritis 04/13/2008    REFERRING DIAG: Osteoarthritis, unspecified osteoarthritis type, unspecified site [M19.90]   THERAPY DIAG:  Muscle weakness (generalized)  Pain in left hip  Chronic pain of right knee  PERTINENT HISTORY: Hx of pulmonary embolism, OA, osteoporosis   PRECAUTIONS: Hx of pulmonary embolism, OA, osteoporosis   SUBJECTIVE: "I feel like I am doing better. At times it doesn't feel like it is but I do think overall it is. I feel like my standing  and walking is improving  PAIN:  Are you having pain? Yes VAS scale: 3/10  Pain location: r knee,  Pain orientation: Right  Pain description: intermittent  Aggravating factors: standing/ walking Relieving factors: exercise        OBJECTIVE:   **Measurements captured at evaluation unless otherwise noted by date** DIAGNOSTIC FINDINGS: N/A   PATIENT SURVEYS:  FOTO 31% function, predicted 37%   09/21/2021       FOTO 39%    COGNITION:          Overall cognitive status: Within functional limits for tasks assessed                POSTURE:  FHP,   PALPATION: R knee TTP along the lateral joint, and along the patellar tendon and distal hamstrings.  L hip, TTP along the glute med/ minimus with multiple trigger points. Low back L SIJ and  lumbar paraspinal tenderness    LE AROM/PROM:   A/PROM Right 08/29/2021 Left 08/29/2021  Hip flexion WNL WNL  Hip extension WNL WNL  Hip abduction WNL WNL  Hip adduction      Hip internal rotation      Hip external rotation      Knee flexion      Knee extension      Ankle dorsiflexion      Ankle plantarflexion      Ankle inversion      Ankle eversion       (Blank rows = not tested)   LE MMT:   MMT Right 08/29/2021 Left 08/29/2021  Hip flexion 4 4-  Hip extension 3+ 3+  Hip abduction 3+ 3+  Hip adduction 3+ 3+  Hip internal rotation      Hip external rotation      Knee flexion 3+ 4-   Knee extension 3+ 4-  Ankle dorsiflexion      Ankle plantarflexion      Ankle inversion      Ankle eversion       (Blank rows = not tested)   LOWER EXTREMITY SPECIAL TESTS:      FUNCTIONAL TESTS:  5 times sit to stand: unable to perform 1 x Timed up and go (TUG): 34 secs   GAIT: Distance walked: into clinic Assistive device utilized: Single point cane Level of assistance: CGA Comments: antalgic, trendelenberg gait pattern with limited stride/ step length bil, HHA for safety         TODAY'S TREATMENT:  [facility] Adult PT Treatment:                                                DATE: 09/28/2021 Therapeutic Exercise: Nu-step L5 x 6 min LE only  - increased time for endurnace Standing hip abduction 2 x 12 bil - with  YTB, HHA from table Standing hip extension 2 x 12 with YTB around ankles - with bil HHA from table Standing marching alternating L/R 2 x 20, with 3# weight around knees  LAQ 2 x 12 with 3# with added pillow squeeze.  Neuromuscular re-ed: Rhomberg balance 2 x 30 sec, eyes closed 2 x 30 sec    [facility] Adult PT Treatment:  DATE: 09/26/2021 Therapeutic Exercise: Nu-step L5 x 5 min UE/LE Sit to stand 2 x 10 holding 5# Kettlebell  Will ball between knees to reduce R knee valgus Standing hip abduction 2 x 12 bil  Seated LAQ  on the R 2 x 12 with 3# while performing active ball squeeze    [facility] Adult PT Treatment:                                                DATE: 09/21/2021 Therapeutic Exercise: Sit to stand 1 x 5 holding 5# kettlebell Progressed to with ball between knees to reduce L lateral weight shift 1 x 5 Therapeutic Activity: Navigating stairs with SPC on 6 inch steps. Initially x 2 with SPC in R UE. On the second attempt at the top of the stairs she did buckle which required min assist from PT via Gait belt. Moved the [facility] to the LUE and she significantly improved her mobility and stability.  Practiced gait training with the [place] in the LUE x 30 ft and demonstrated improved cadence and stability but required cues for shorter stride.       PATIENT EDUCATION:                1/17/202 Education details: Reviewed HEP and updated today  Person educated: Patient Education method: Explanation Education comprehension: verbalized understanding     HOME EXERCISE PROGRAM: Access Code: [email or web address] URL: [email or web address] Date: 09/26/2021 Prepared by: Starr Lake  Exercises Lower Trunk Rotation - 1 x daily - 7 x weekly - 2 sets - 10 reps Hooklying Clamshell with Resistance - 1 x daily - 7 x weekly - 2 sets - 10 reps Seated March - 1 x daily - 7 x weekly - 2 sets - 10 reps Supine Bridge - 1 x daily - 7 x weekly - 2 sets - 10 reps Sit to Stand - 1 x daily - 7 x weekly - 2 sets - 8 reps Sidelying Hip Abduction - 1 x daily - 7 x weekly - 3 sets - 10 reps Seated Hip Adduction Isometrics with Ball - 1 x daily - 7 x weekly - 2 sets - 10 reps - 5 seconds hold Standing Hip Abduction with Counter Support - 1 x daily - 7 x weekly - 2 sets - 12 reps      ASSESSMENT:   CLINICAL IMPRESSION:  Pt arrives reporting her pain was at 8/10 which she demonstrated no visual indication of that level of pain. Reviewed the VAS which she indicated it is more of a 3-4/10. Continued working on strengthening in  standing with increased reps and utilizing standing rest breaks to promote standing endurnace.   REHAB POTENTIAL: Good   CLINICAL DECISION MAKING: Evolving/moderate complexity   EVALUATION COMPLEXITY: Moderate     GOALS: Goals reviewed with patient? Yes   SHORT TERM GOALS:   STG Name Target Date Goal status  1 Pt to be IND with HEP Baseline:  09/19/2021 Met 09/26/2021    LONG TERM GOALS:    LTG Name Target Date Goal status  1 Increase gross LE strength to >/= 4/5 to promote hip stability  Baseline: 10/10/2021 INITIAL  2 Pt to be able to walk for >/= 30 min with LRAD with </= supervision for safety Baseline: 10/10/2021 INITIAL  3 Pt to be able to perform sit to stand  with no use of UE >/= 5 x  to demo improvement in funciton/ strength Baseline: 10/10/2021 INITIAL  4 Pt to increase FOTO to >/= 37% to demo improvement in function Baseline: 10/10/2021 INITIAL  5 Pt to reduce TUG by >/= 8 seconds to demo improvement in safety Baseline: initial testing 34 secs 10/10/2021 INITIAL  6 Pt to be IND with all HEP and is able to maintain and progress her current LOF IND Baseline: 10/10/2021 INITIAL    PLAN: PT FREQUENCY: 2x/week   PT DURATION: 6 weeks   PLANNED INTERVENTIONS: Therapeutic exercises, Therapeutic activity, Neuro Muscular re-education, Balance training, Gait training, Patient/Family education, Joint mobilization, Stair training, Aquatic Therapy, Dry Needling, Cryotherapy, Moist heat, Taping, and Manual therapy   PLAN FOR NEXT SESSION: F/U on stretching results and resume strengthening activities, focus on STS mechanics and functional strengthening tasks/activities. Continued gait/ stair training with SPC in LUE. Practiced sit to stand avoiding L lateral weight shift. Standing endurancing training, reduce R knee valgus, update balance  Kinga Cassar PT, DPT, LAT, ATC  09/28/21  11:56 AM

## 2021-10-03 ENCOUNTER — Other Ambulatory Visit: Payer: Self-pay | Admitting: *Deleted

## 2021-10-03 ENCOUNTER — Encounter: Payer: Self-pay | Admitting: *Deleted

## 2021-10-03 NOTE — Patient Outreach (Signed)
Care Management Clinical Social Work Note  10/03/2021 Name: Shannon Jensen MRN: [id number] DOB: 05/24/1928  KENZEE BASSIN is a 88 y.o. year old female who is a primary care patient of [name], MD.  The Care Management team was consulted for assistance with chronic disease management and coordination needs.  Engaged with patient's daughter by telephone for follow up visit in response to provider referral for social work chronic care management and care coordination services.  Patient is not eligible to receive inpatient rehabilitative services in a skilled nursing facility, as insurance continues to deny coverage.  Consent to Services:  Ms. Sagrero was given information about Care Management services today including:  Care Management services includes personalized support from designated clinical staff supervised by her physician, including individualized plan of care and coordination with other care providers 24/7 contact phone numbers for assistance for urgent and routine care needs. The patient may stop case management services at any time by phone call to the office staff.  Patient agreed to services and consent obtained.   Assessment: Review of patient past medical history, allergies, medications, and health status, including review of relevant consultants reports was performed today as part of a comprehensive evaluation and provision of chronic care management and care coordination services.  SDOH (Social Determinants of Health) assessments and interventions performed:    Advanced Directives Status: Not addressed in this encounter.  Care Plan  Allergies  Allergen Reactions   Celecoxib Other (See Comments)    Unknown   Erythromycin Other (See Comments)    Unknown   Nabumetone Other (See Comments)    Unknown   Nsaids Other (See Comments)    Unknown Unknown    Oxycodone-Acetaminophen Other (See Comments)   Tyloxapol    Vioxx [Rofecoxib] Other (See Comments)     Unknown    Outpatient Encounter Medications as of 10/03/2021  Medication Sig   acetaminophen (TYLENOL) 325 MG tablet Take 650 mg by mouth every 6 (six) hours as needed for moderate pain or headache.    amLODipine (NORVASC) 5 MG tablet Take 1 tablet (5 mg total) by mouth daily.   Cholecalciferol (VITAMIN D) 2000 units CAPS Take 2,000 Units by mouth daily.   metoprolol tartrate (LOPRESSOR) 25 MG tablet Take 1 tablet (25 mg total) by mouth 2 (two) times daily.   Multiple Vitamins-Minerals (WOMENS ONE DAILY) TABS Take 1 tablet by mouth daily.     Omega-3 Fatty Acids (FISH OIL) 1200 MG CAPS Take 1,200 mg by mouth daily.    rivaroxaban (XARELTO) 20 MG TABS tablet Take 1 tablet (20 mg total) by mouth daily with supper.   No facility-administered encounter medications on file as of 10/03/2021.    Patient Active Problem List   Diagnosis Date Noted   Chest pain at rest 06/13/2021   SVT (supraventricular tachycardia) ([place])    Acute saddle pulmonary embolism ([place])    Enlarged thyroid 06/23/2019   Genetic testing 10/03/2018   Family history of breast cancer    Family history of pancreatic cancer    Family history of prostate cancer    Presbycusis of both ears 08/25/2018   Laryngopharyngeal reflux (LPR) 06/10/2018   Malignant neoplasm of upper-inner quadrant of left breast in female, estrogen receptor positive ([place]) 09/24/2017   Internal hemorrhoids 09/23/2014   Family history of colon cancer 06/30/2014   History of colonic polyps 06/30/2014   GERD (gastroesophageal reflux disease) 08/27/2012   Diverticulosis    Osteoporosis    Pelvic relaxation  Cystocele    H/O vitamin D deficiency    Hyperlipidemia LDL goal <130 11/08/2008   Essential hypertension 04/13/2008   Osteoarthritis 04/13/2008    Conditions to be addressed/monitored: HTN, Osteoporosis, and Osteoarthritis.  Level of Care Concerns, ADL/IADL Limitations, Limited Access to Caregiver, Cognitive Deficits, Memory Deficits, and Lacks  Knowledge of [employer].  Care Plan : LCSW Plan of Care  Updates made by Francis Gaines, LCSW since 10/03/2021 12:00 AM     Problem: Receive Skilled Nursing Placement for Short-Term Rehabilitative Services. Resolved 10/03/2021  Priority: High     Goal: Receive Skilled Nursing Placement for Short-Term Rehabilitative Services. Completed 10/03/2021  Start Date: 06/29/2021  Expected End Date: 10/03/2021  This Visit's Progress: On track  Recent Progress: On track  Priority: High  Note:   Current Barriers:  Chronic disease management support and education needs related to Mobility Issues, Fall Risk, Osteoporosis, Osteoarthritis and Hypertension. Inability to perform ADL's/IADL's independently, limited mobility, and now requiring 24 hour care and supervision.   Clinical Goal(s):  Patient and daughter will work with LCSW to identify and address any acute and chronic care coordination needs. Patient and daughter will work with LCSW to pursue skilled nursing facility placement for short-term rehabilitative services, versus home health physical and occupational therapy services.  LCSW Interventions:  Inter-disciplinary care team collaboration (see longitudinal plan of care). Collaboration with Primary Care Physician, Dr. Jill Alexanders to provide updates on patient's plan of care.     Attentive Listening provided, Problem Solving Skills utilized, Task-Centered Solutions identified, and Caregiver Stress acknowledged.   Patient Goals/Self-Care Activities:   Please continue to receive rehabilitative services with Starr Lake, Physical Therapist with [facility] (# [phone]), twice per week. Contact LCSW directly (# [phone]) if you have questions, need assistance, or if additional social work needs are identified in the near future.   No Follow-Up Required.     Nat Christen, BSW, MSW, LCSW  Licensed [facility]  [place] [address] [place], [place] [address] Physical Address-[address] [place], [place] [address] Toll Free Main # [phone] Fax # [phone] Cell # [phone]  [email or web address]

## 2021-10-05 ENCOUNTER — Encounter: Payer: Self-pay | Admitting: Physical Therapy

## 2021-10-05 ENCOUNTER — Other Ambulatory Visit: Payer: Self-pay

## 2021-10-05 ENCOUNTER — Ambulatory Visit: Payer: Medicare Other | Admitting: Physical Therapy

## 2021-10-05 DIAGNOSIS — M6281 Muscle weakness (generalized): Secondary | ICD-10-CM

## 2021-10-05 DIAGNOSIS — M25552 Pain in left hip: Secondary | ICD-10-CM

## 2021-10-05 DIAGNOSIS — M25561 Pain in right knee: Secondary | ICD-10-CM

## 2021-10-05 DIAGNOSIS — G8929 Other chronic pain: Secondary | ICD-10-CM | POA: Diagnosis not present

## 2021-10-05 NOTE — Therapy (Signed)
OUTPATIENT PHYSICAL THERAPY TREATMENT NOTE   Patient Name: Shannon Jensen MRN: [id number] DOB:09-Jan-1928, 88 y.o., female Today's Date: 10/05/2021  PCP: [name], MD REFERRING PROVIDER: [name], MD   PT End of Session - 10/05/21 1106     Visit Number 9    Number of Visits 13    Date for PT Re-Evaluation 10/10/21    Authorization Type [employer] MCR - KX mod at 15th visit    Progress Note Due on Visit 10    PT Start Time 1102    PT Stop Time 0947    PT Time Calculation (min) 43 min    Activity Tolerance Patient tolerated treatment well    Behavior During Therapy [facility] for tasks assessed/performed                  Past Medical History:  Diagnosis Date   Anxiety and depression    Arthritis    Breast cancer ([place]) 08/2008   Right Breast Cancer   Breast cancer ([place]) 2019   Left Breast Cancer   Chronic headaches    Cystocele    Diverticulosis    Family history of breast cancer    Family history of colon cancer    Family history of pancreatic cancer    Family history of prostate cancer    GERD (gastroesophageal reflux disease)    H/O vitamin D deficiency    HLD (hyperlipidemia)    Hypertension    Osteoarthritis    Osteoporosis    Pelvic relaxation    Personal history of radiation therapy 2009   Right Breast Cancer   Pulmonary embolism ([place]) 06/2021   Urinary tract infection 2014   Past Surgical History:  Procedure Laterality Date   [place]   BREAST LUMPECTOMY Right 2009   BREAST LUMPECTOMY Left 2019   BREAST LUMPECTOMY WITH RADIOACTIVE SEED LOCALIZATION Left 11/19/2017   Procedure: BREAST LUMPECTOMY WITH RADIOACTIVE SEED LOCALIZATION;  Surgeon: Rolm Bookbinder, MD;  Location: [facility];  Service: General;  Laterality: Left;   BREAST SURGERY  2009   cancer-lymph node removal   NECK SURGERY     cyst removal   Patient Active Problem List   Diagnosis Date Noted   Chest pain at rest 06/13/2021   SVT  (supraventricular tachycardia) ([place])    Acute saddle pulmonary embolism ([place])    Enlarged thyroid 06/23/2019   Genetic testing 10/03/2018   Family history of breast cancer    Family history of pancreatic cancer    Family history of prostate cancer    Presbycusis of both ears 08/25/2018   Laryngopharyngeal reflux (LPR) 06/10/2018   Malignant neoplasm of upper-inner quadrant of left breast in female, estrogen receptor positive ([place]) 09/24/2017   Internal hemorrhoids 09/23/2014   Family history of colon cancer 06/30/2014   History of colonic polyps 06/30/2014   GERD (gastroesophageal reflux disease) 08/27/2012   Diverticulosis    Osteoporosis    Pelvic relaxation    Cystocele    H/O vitamin D deficiency    Hyperlipidemia LDL goal <130 11/08/2008   Essential hypertension 04/13/2008   Osteoarthritis 04/13/2008    REFERRING DIAG: Osteoarthritis, unspecified osteoarthritis type, unspecified site [M19.90]   THERAPY DIAG:  Muscle weakness (generalized)  Pain in left hip  Chronic pain of right knee  PERTINENT HISTORY: Hx of pulmonary embolism, OA, osteoporosis   PRECAUTIONS: Hx of pulmonary embolism, OA, osteoporosis   SUBJECTIVE: "I am doing pretty good, been doing  the exercise every day. Pain is only at about a 2/10."  PAIN:  Are you having pain? Yes VAS scale: 2/10 Pain location: l hip and R knee PAIN TYPE: sore Aggravating factors: prolonged standing, walking Relieving factors: sitting and resting       OBJECTIVE:   **Measurements captured at evaluation unless otherwise noted by date** DIAGNOSTIC FINDINGS: N/A   PATIENT SURVEYS:  FOTO 31% function, predicted 37%   09/21/2021       FOTO 39%    COGNITION:          Overall cognitive status: Within functional limits for tasks assessed                POSTURE:  FHP,   PALPATION: R knee TTP along the lateral joint, and along the patellar tendon and distal hamstrings.  L hip, TTP along the glute med/ minimus with  multiple trigger points. Low back L SIJ and lumbar paraspinal tenderness    LE AROM/PROM:   A/PROM Right 08/29/2021 Left 08/29/2021  Hip flexion WNL WNL  Hip extension WNL WNL  Hip abduction WNL WNL  Hip adduction      Hip internal rotation      Hip external rotation      Knee flexion      Knee extension      Ankle dorsiflexion      Ankle plantarflexion      Ankle inversion      Ankle eversion       (Blank rows = not tested)   LE MMT:   MMT Right 08/29/2021 Left 08/29/2021  Hip flexion 4 4-  Hip extension 3+ 3+  Hip abduction 3+ 3+  Hip adduction 3+ 3+  Hip internal rotation      Hip external rotation      Knee flexion 3+ 4-   Knee extension 3+ 4-  Ankle dorsiflexion      Ankle plantarflexion      Ankle inversion      Ankle eversion       (Blank rows = not tested)   LOWER EXTREMITY SPECIAL TESTS:      FUNCTIONAL TESTS:  5 times sit to stand: unable to perform 1 x Timed up and go (TUG): 34 secs   GAIT: Distance walked: into clinic Assistive device utilized: Single point cane Level of assistance: CGA Comments: antalgic, trendelenberg gait pattern with limited stride/ step length bil, HHA for safety         TODAY'S TREATMENT:  [facility] Adult PT Treatment:                                                DATE: 10/05/2021 Therapeutic Exercise: Nu-step L6 x 6 min LE only Standing hip abduction extension 2 x 20 bil  without resistance (hands on table) Neuromuscular re-ed: Corner Rhomberg balance 1 x 30 second with EO, 3 x 30 sec with EC - increased postural sway with EC but was able to maintain balance Standing toe taps on step alternating L/R on 6 inch step 2 x 15 - using SPC in L hand for stability   [facility] Adult PT Treatment:  DATE: 09/28/2021 Therapeutic Exercise: Nu-step L5 x 6 min LE only  - increased time for endurnace Standing hip abduction 2 x 12 bil - with  YTB, HHA from table Standing hip extension 2 x 12  with YTB around ankles - with bil HHA from table Standing marching alternating L/R 2 x 20, with 3# weight around knees  LAQ 2 x 12 with 3# with added pillow squeeze.  Neuromuscular re-ed: Rhomberg balance 2 x 30 sec, eyes closed 2 x 30 sec    [facility] Adult PT Treatment:                                                DATE: 09/26/2021 Therapeutic Exercise: Nu-step L5 x 5 min UE/LE Sit to stand 2 x 10 holding 5# Kettlebell  Will ball between knees to reduce R knee valgus Standing hip abduction 2 x 12 bil  Seated LAQ on the R 2 x 12 with 3# while performing active ball squeeze       PATIENT EDUCATION:                10/05/2021 Education details: updated HEP for corner balance Person educated: Patient/ care taker Education method: Explanation Education comprehension: verbalized understanding     HOME EXERCISE PROGRAM: Access Code: [email or web address] URL: [email or web address] Date: 09/26/2021 Prepared by: Starr Lake  Exercises Lower Trunk Rotation - 1 x daily - 7 x weekly - 2 sets - 10 reps Hooklying Clamshell with Resistance - 1 x daily - 7 x weekly - 2 sets - 10 reps Seated March - 1 x daily - 7 x weekly - 2 sets - 10 reps Supine Bridge - 1 x daily - 7 x weekly - 2 sets - 10 reps Sit to Stand - 1 x daily - 7 x weekly - 2 sets - 8 reps Sidelying Hip Abduction - 1 x daily - 7 x weekly - 3 sets - 10 reps Seated Hip Adduction Isometrics with Ball - 1 x daily - 7 x weekly - 2 sets - 10 reps - 5 seconds hold Standing Hip Abduction with Counter Support - 1 x daily - 7 x weekly - 2 sets - 12 reps      ASSESSMENT:   CLINICAL IMPRESSION:  Pt arrives to session reported pain is 2/10 and overall has done her exercises at home consistently. She does describe some soreness in the back which may be attributed to working on standing up straight when she is walking and doing her home exercises. Continued working on gross hip/ knee strengthening today with increased reps to promote  strengthening as well as endurance training. Updated HEP to include corner balance training. Margalit did well with all exercises noting some fatigue   REHAB POTENTIAL: Good   CLINICAL DECISION MAKING: Evolving/moderate complexity   EVALUATION COMPLEXITY: Moderate     GOALS: Goals reviewed with patient? Yes   SHORT TERM GOALS:   STG Name Target Date Goal status  1 Pt to be IND with HEP Baseline:  09/19/2021 Met 09/26/2021    LONG TERM GOALS:    LTG Name Target Date Goal status  1 Increase gross LE strength to >/= 4/5 to promote hip stability  Baseline: 10/10/2021 INITIAL  2 Pt to be able to walk for >/= 30 min with LRAD with </= supervision for safety Baseline: 10/10/2021 INITIAL  3  Pt to be able to perform sit to stand with no use of UE >/= 5 x  to demo improvement in funciton/ strength Baseline: 10/10/2021 INITIAL  4 Pt to increase FOTO to >/= 37% to demo improvement in function Baseline: 10/10/2021 INITIAL  5 Pt to reduce TUG by >/= 8 seconds to demo improvement in safety Baseline: initial testing 34 secs 10/10/2021 INITIAL  6 Pt to be IND with all HEP and is able to maintain and progress her current LOF IND Baseline: 10/10/2021 INITIAL    PLAN: PT FREQUENCY: 2x/week   PT DURATION: 6 weeks   PLANNED INTERVENTIONS: Therapeutic exercises, Therapeutic activity, Neuro Muscular re-education, Balance training, Gait training, Patient/Family education, Joint mobilization, Stair training, Aquatic Therapy, Dry Needling, Cryotherapy, Moist heat, Taping, and Manual therapy   PLAN FOR NEXT SESSION: F/U on stretching results and resume strengthening activities, Gross hip/ knee strengthening, add core increase reps to maximize endurance. Continued gait/ stair training with SPC in LUE. Standing endurancing training, response to balance training.   Soni Kegel PT, DPT, LAT, ATC  10/05/21  11:47 AM

## 2021-10-10 ENCOUNTER — Other Ambulatory Visit: Payer: Self-pay

## 2021-10-10 ENCOUNTER — Ambulatory Visit: Payer: Medicare Other

## 2021-10-10 DIAGNOSIS — M25552 Pain in left hip: Secondary | ICD-10-CM | POA: Diagnosis not present

## 2021-10-10 DIAGNOSIS — G8929 Other chronic pain: Secondary | ICD-10-CM | POA: Diagnosis not present

## 2021-10-10 DIAGNOSIS — M6281 Muscle weakness (generalized): Secondary | ICD-10-CM

## 2021-10-10 DIAGNOSIS — M25561 Pain in right knee: Secondary | ICD-10-CM | POA: Diagnosis not present

## 2021-10-10 NOTE — Therapy (Signed)
OUTPATIENT PHYSICAL THERAPY TREATMENT NOTE/10th Visit Progress Note   Patient Name: Shannon Jensen MRN: [id number] DOB:1927-12-22, 88 y.o., female Today's Date: 10/10/2021  PCP: [name], MD REFERRING PROVIDER: [name], MD   PT End of Session - 10/10/21 1400     Visit Number 10    Number of Visits 13    Date for PT Re-Evaluation 10/10/21    Authorization Type [employer] MCR - KX mod at 15th visit    Progress Note Due on Visit 10    PT Start Time 1400    PT Stop Time 8366    PT Time Calculation (min) 45 min    Activity Tolerance Patient tolerated treatment well    Behavior During Therapy [facility] for tasks assessed/performed                  Past Medical History:  Diagnosis Date   Anxiety and depression    Arthritis    Breast cancer ([place]) 08/2008   Right Breast Cancer   Breast cancer ([place]) 2019   Left Breast Cancer   Chronic headaches    Cystocele    Diverticulosis    Family history of breast cancer    Family history of colon cancer    Family history of pancreatic cancer    Family history of prostate cancer    GERD (gastroesophageal reflux disease)    H/O vitamin D deficiency    HLD (hyperlipidemia)    Hypertension    Osteoarthritis    Osteoporosis    Pelvic relaxation    Personal history of radiation therapy 2009   Right Breast Cancer   Pulmonary embolism ([place]) 06/2021   Urinary tract infection 2014   Past Surgical History:  Procedure Laterality Date   [place]   BREAST LUMPECTOMY Right 2009   BREAST LUMPECTOMY Left 2019   BREAST LUMPECTOMY WITH RADIOACTIVE SEED LOCALIZATION Left 11/19/2017   Procedure: BREAST LUMPECTOMY WITH RADIOACTIVE SEED LOCALIZATION;  Surgeon: Rolm Bookbinder, MD;  Location: [place];  Service: General;  Laterality: Left;   BREAST SURGERY  2009   cancer-lymph node removal   NECK SURGERY     cyst removal   Patient Active Problem List   Diagnosis Date Noted   Chest pain at  rest 06/13/2021   SVT (supraventricular tachycardia) ([place])    Acute saddle pulmonary embolism ([place])    Enlarged thyroid 06/23/2019   Genetic testing 10/03/2018   Family history of breast cancer    Family history of pancreatic cancer    Family history of prostate cancer    Presbycusis of both ears 08/25/2018   Laryngopharyngeal reflux (LPR) 06/10/2018   Malignant neoplasm of upper-inner quadrant of left breast in female, estrogen receptor positive ([place]) 09/24/2017   Internal hemorrhoids 09/23/2014   Family history of colon cancer 06/30/2014   History of colonic polyps 06/30/2014   GERD (gastroesophageal reflux disease) 08/27/2012   Diverticulosis    Osteoporosis    Pelvic relaxation    Cystocele    H/O vitamin D deficiency    Hyperlipidemia LDL goal <130 11/08/2008   Essential hypertension 04/13/2008   Osteoarthritis 04/13/2008    REFERRING DIAG: Osteoarthritis, unspecified osteoarthritis type, unspecified site [M19.90]   THERAPY DIAG:  Muscle weakness (generalized)  Pain in left hip  Chronic pain of right knee  PERTINENT HISTORY: Hx of pulmonary embolism, OA, osteoporosis   PRECAUTIONS: Hx of pulmonary embolism, OA, osteoporosis   SUBJECTIVE: reports performing HEP consistently,  DTR reports continued balance issues with R leg/knee weakness  PAIN:  Are you having pain? Yes VAS scale: 6/10, 1/10 resting Pain location: L hip and R knee PAIN TYPE: sore Aggravating factors: prolonged standing, walking Relieving factors: sitting and resting       OBJECTIVE:   **Measurements captured at evaluation unless otherwise noted by date** DIAGNOSTIC FINDINGS: N/A   PATIENT SURVEYS:  FOTO 31% function, predicted 37%   09/21/2021       FOTO 39%  10/10/21             FOTO 45%   COGNITION:          Overall cognitive status: Within functional limits for tasks assessed                POSTURE:  FHP,   PALPATION: R knee TTP along the lateral joint, and along the patellar  tendon and distal hamstrings.  L hip, TTP along the glute med/ minimus with multiple trigger points. Low back L SIJ and lumbar paraspinal tenderness    LE AROM/PROM:   A/PROM Right 08/29/2021 Left 08/29/2021  Hip flexion WNL WNL  Hip extension WNL WNL  Hip abduction WNL WNL  Hip adduction      Hip internal rotation      Hip external rotation      Knee flexion      Knee extension      Ankle dorsiflexion      Ankle plantarflexion      Ankle inversion      Ankle eversion       (Blank rows = not tested)   LE MMT:   MMT Right 08/29/2021 Left 08/29/2021  Hip flexion 4 4-  Hip extension 3+ 3+  Hip abduction 3+ 3+  Hip adduction 3+ 3+  Hip internal rotation      Hip external rotation      Knee flexion 3+ 4-   Knee extension 3+ 4-  Ankle dorsiflexion      Ankle plantarflexion      Ankle inversion      Ankle eversion       (Blank rows = not tested)   LOWER EXTREMITY SPECIAL TESTS:      FUNCTIONAL TESTS:  5 times sit to stand: unable to perform 1 x Timed up and go (TUG): 34 secs   GAIT: Distance walked: into clinic Assistive device utilized: Single point cane Level of assistance: CGA Comments: antalgic, R knee instability in stance phase        TODAY'S TREATMENT: [facility] Adult PT Treatment:                                                DATE: 10/10/21 Therapeutic Exercise: Nustep L2 6 min, seat 4 arms 6 Step ups 4", 5x FWD, 5x sidestep, bilateral  Therapeutic Activity: 5x STS w/o UE  TUG w/cane 19s Corner stand tandem EO/EC, 30s each position, narrow BOS EO/EC 30s, head turns/nods, narrow BOS 5x each in EO/EC position  [facility] Adult PT Treatment:                                                DATE: 10/05/2021 Therapeutic Exercise: Nu-step L6 x 6 min LE  only Standing hip abduction extension 2 x 20 bil  without resistance (hands on table) Neuromuscular re-ed: Corner Rhomberg balance 1 x 30 second with EO, 3 x 30 sec with EC - increased postural sway with EC but  was able to maintain balance Standing toe taps on step alternating L/R on 6 inch step 2 x 15 - using SPC in L hand for stability   [facility] Adult PT Treatment:                                                DATE: 09/28/2021 Therapeutic Exercise: Nu-step L5 x 6 min LE only  - increased time for endurnace Standing hip abduction 2 x 12 bil - with  YTB, HHA from table Standing hip extension 2 x 12 with YTB around ankles - with bil HHA from table Standing marching alternating L/R 2 x 20, with 3# weight around knees  LAQ 2 x 12 with 3# with added pillow squeeze.  Neuromuscular re-ed: Rhomberg balance 2 x 30 sec, eyes closed 2 x 30 sec    [facility] Adult PT Treatment:                                                DATE: 09/26/2021 Therapeutic Exercise: Nu-step L5 x 5 min UE/LE Sit to stand 2 x 10 holding 5# Kettlebell  Will ball between knees to reduce R knee valgus Standing hip abduction 2 x 12 bil  Seated LAQ on the R 2 x 12 with 3# while performing active ball squeeze       PATIENT EDUCATION:                10/05/2021 Education details: updated HEP for corner balance Person educated: Patient/ care taker Education method: Explanation Education comprehension: verbalized understanding     HOME EXERCISE PROGRAM: Access Code: [email or web address] URL: [email or web address] Date: 09/26/2021 Prepared by: Starr Lake  Exercises Lower Trunk Rotation - 1 x daily - 7 x weekly - 2 sets - 10 reps Hooklying Clamshell with Resistance - 1 x daily - 7 x weekly - 2 sets - 10 reps Seated March - 1 x daily - 7 x weekly - 2 sets - 10 reps Supine Bridge - 1 x daily - 7 x weekly - 2 sets - 10 reps Sit to Stand - 1 x daily - 7 x weekly - 2 sets - 8 reps Sidelying Hip Abduction - 1 x daily - 7 x weekly - 3 sets - 10 reps Seated Hip Adduction Isometrics with Ball - 1 x daily - 7 x weekly - 2 sets - 10 reps - 5 seconds hold Standing Hip Abduction with Counter Support - 1 x daily - 7 x weekly - 2 sets -  12 reps      ASSESSMENT:   CLINICAL IMPRESSION:  10th visit progress note.  Patient reporting improved overall function and balance, confirmed by DTR, FOTO score improved to 45.  Does not appear to have deficits in balance/vestibular system but R knee instability contributing to unsteady gait REHAB POTENTIAL: Good   CLINICAL DECISION MAKING: Evolving/moderate complexity   EVALUATION COMPLEXITY: Moderate     GOALS: Goals reviewed with patient? Yes   SHORT TERM GOALS:  STG Name Target Date Goal status  1 Pt to be IND with HEP Baseline:  09/19/2021 Met 09/26/2021    LONG TERM GOALS:    LTG Name Target Date Goal status  1 Increase gross LE strength to >/= 4/5 to promote hip stability  Baseline: 10/10/2021 INITIAL  2 Pt to be able to walk for >/= 30 min with LRAD with </= supervision for safety Baseline: 10/10/2021 10/10/21 Unable to ambulate 30 min   3 Pt to be able to perform sit to stand with no use of UE >/= 5 x  to demo improvement in funciton/ strength Baseline: 10/10/2021 10/10/21 Able to stand 5x w/o need of UEs but locks knees  4 Pt to increase FOTO to >/= 37% to demo improvement in function Baseline: 10/10/2021 10/10/21 FOTO 45%  5 Pt to reduce TUG by >/= 8 seconds to demo improvement in safety Baseline: initial testing 34 secs 10/10/2021 10/10/21 TUG with cane 19s  6 Pt to be IND with all HEP and is able to maintain and progress her current LOF IND Baseline: 10/10/2021 INITIAL    PLAN: PT FREQUENCY: 2x/week   PT DURATION: 6 weeks   PLANNED INTERVENTIONS: Therapeutic exercises, Therapeutic activity, Neuro Muscular re-education, Balance training, Gait training, Patient/Family education, Joint mobilization, Stair training, Aquatic Therapy, Dry Needling, Cryotherapy, Moist heat, Taping, and Manual therapy   PLAN FOR NEXT SESSION: F/U on stretching results and resume strengthening activities, Gross hip/ knee strengthening, add core increase reps to maximize endurance. Continued  gait/ stair training with SPC in LUE. Standing endurancing training, response to balance training, continue to promote RLE stability  Leroy Sea PT  10/10/21  2:00 PM

## 2021-10-18 ENCOUNTER — Ambulatory Visit: Payer: Medicare Other | Attending: Family Medicine | Admitting: Physical Therapy

## 2021-10-18 ENCOUNTER — Other Ambulatory Visit: Payer: Self-pay

## 2021-10-18 DIAGNOSIS — M25552 Pain in left hip: Secondary | ICD-10-CM | POA: Insufficient documentation

## 2021-10-18 DIAGNOSIS — M6281 Muscle weakness (generalized): Secondary | ICD-10-CM | POA: Diagnosis not present

## 2021-10-18 DIAGNOSIS — M25561 Pain in right knee: Secondary | ICD-10-CM | POA: Diagnosis not present

## 2021-10-18 DIAGNOSIS — G8929 Other chronic pain: Secondary | ICD-10-CM | POA: Insufficient documentation

## 2021-10-18 NOTE — Therapy (Signed)
OUTPATIENT PHYSICAL THERAPY TREATMENT NOTE   Patient Name: Shannon Jensen MRN: [id number] DOB:[birth year], 88 y.o., female Today's Date: 10/18/2021  PCP: [name], MD REFERRING PROVIDER: [name], MD   PT End of Session - 10/18/21 1023     Visit Number 11    Number of Visits 16    Date for PT Re-Evaluation 12/13/21    Authorization Type [employer] MCR - KX mod at 15th visit    Progress Note Due on Visit 20    PT Start Time 1018    PT Stop Time 1105    PT Time Calculation (min) 47 min    Activity Tolerance Patient tolerated treatment well    Behavior During Therapy [facility] for tasks assessed/performed                   Past Medical History:  Diagnosis Date   Anxiety and depression    Arthritis    Breast cancer ([place]) 08/2008   Right Breast Cancer   Breast cancer ([place]) 2019   Left Breast Cancer   Chronic headaches    Cystocele    Diverticulosis    Family history of breast cancer    Family history of colon cancer    Family history of pancreatic cancer    Family history of prostate cancer    GERD (gastroesophageal reflux disease)    H/O vitamin D deficiency    HLD (hyperlipidemia)    Hypertension    Osteoarthritis    Osteoporosis    Pelvic relaxation    Personal history of radiation therapy 2009   Right Breast Cancer   Pulmonary embolism ([place]) 06/2021   Urinary tract infection 2014   Past Surgical History:  Procedure Laterality Date   [place]   BREAST LUMPECTOMY Right 2009   BREAST LUMPECTOMY Left 2019   BREAST LUMPECTOMY WITH RADIOACTIVE SEED LOCALIZATION Left 11/19/2017   Procedure: BREAST LUMPECTOMY WITH RADIOACTIVE SEED LOCALIZATION;  Surgeon: Rolm Bookbinder, MD;  Location: [place];  Service: General;  Laterality: Left;   BREAST SURGERY  2009   cancer-lymph node removal   NECK SURGERY     cyst removal   Patient Active Problem List   Diagnosis Date Noted   Chest pain at rest 06/13/2021   SVT  (supraventricular tachycardia) ([place])    Acute saddle pulmonary embolism ([place])    Enlarged thyroid 06/23/2019   Genetic testing 10/03/2018   Family history of breast cancer    Family history of pancreatic cancer    Family history of prostate cancer    Presbycusis of both ears 08/25/2018   Laryngopharyngeal reflux (LPR) 06/10/2018   Malignant neoplasm of upper-inner quadrant of left breast in female, estrogen receptor positive ([place]) 09/24/2017   Internal hemorrhoids 09/23/2014   Family history of colon cancer 06/30/2014   History of colonic polyps 06/30/2014   GERD (gastroesophageal reflux disease) 08/27/2012   Diverticulosis    Osteoporosis    Pelvic relaxation    Cystocele    H/O vitamin D deficiency    Hyperlipidemia LDL goal <130 11/08/2008   Essential hypertension 04/13/2008   Osteoarthritis 04/13/2008    REFERRING DIAG: Osteoarthritis, unspecified osteoarthritis type, unspecified site [M19.90]   THERAPY DIAG:  Muscle weakness (generalized)  Pain in left hip  Chronic pain of right knee  PERTINENT HISTORY: Hx of pulmonary embolism, OA, osteoporosis   PRECAUTIONS: Hx of pulmonary embolism, OA, osteoporosis   SUBJECTIVE: "I don't know how much I  can handle today, I have been having some personal issues."  PAIN:  Are you having pain? Yes VAS scale: 4/10 Pain location: L hip and R Knee PAIN TYPE: sore Aggravating factors: standing/ walking Relieving factors: standing/ walking    OBJECTIVE:   **Measurements captured at evaluation unless otherwise noted by date** DIAGNOSTIC FINDINGS: N/A   PATIENT SURVEYS:  FOTO 31% function, predicted 37%   09/21/2021       FOTO 39%  10/10/21             FOTO 45%   COGNITION:          Overall cognitive status: Within functional limits for tasks assessed                POSTURE:  FHP,   PALPATION: R knee TTP along the lateral joint, and along the patellar tendon and distal hamstrings.  L hip, TTP along the glute med/ minimus  with multiple trigger points. Low back L SIJ and lumbar paraspinal tenderness    LE AROM/PROM:   A/PROM Right 08/29/2021 Left 08/29/2021  Hip flexion WNL WNL  Hip extension WNL WNL  Hip abduction WNL WNL  Hip adduction      Hip internal rotation      Hip external rotation      Knee flexion      Knee extension      Ankle dorsiflexion      Ankle plantarflexion      Ankle inversion      Ankle eversion       (Blank rows = not tested)   LE MMT:   MMT Right 08/29/2021 Left 08/29/2021 Right  10/18/2021 Left     10/18/2021  Hip flexion 4 4- 4 4  Hip extension 3+ 3+ 3+ 3+  Hip abduction 3+ 3+ 4- 4-  Hip adduction 3+ 3+ 4- 4-  Hip internal rotation        Hip external rotation        Knee flexion 3+ 4-  4 4  Knee extension 3+ 4- 4 4  Ankle dorsiflexion        Ankle plantarflexion        Ankle inversion        Ankle eversion         (Blank rows = not tested)   LOWER EXTREMITY SPECIAL TESTS:      FUNCTIONAL TESTS:   10/18/2021 5 times sit to stand: 17 seconds Timed up and go (TUG): 17 sec, 14 sec, 13 sec.  14.6 seconds avg   GAIT: Distance walked: into clinic Assistive device utilized: Single point cane Level of assistance: CGA Comments: antalgic, R knee instability in stance phase        TODAY'S TREATMENT:  [facility] Adult PT Treatment:                                                DATE: 10/18/2021 Therapeutic Exercise: Nu-step L5 x 6 min UE/LE 5 x sit to stand 17 seconds Tug assessment avg 14.6 sec  Therapeutic Activity: Navigating stairs on 6 inch steps using SPC in L hand. Verbal cues for proper form  Self Care Reviewed sleeping posture to reduce low back pain    [facility] Adult PT Treatment:  DATE: 10/10/21 Therapeutic Exercise: Nustep L2 6 min, seat 4 arms 6 Step ups 4", 5x FWD, 5x sidestep, bilateral  Therapeutic Activity: 5x STS w/o UE  TUG w/cane 19s Corner stand tandem EO/EC, 30s each position, narrow BOS EO/EC  30s, head turns/nods, narrow BOS 5x each in EO/EC position  [facility] Adult PT Treatment:                                                DATE: 10/05/2021 Therapeutic Exercise: Nu-step L6 x 6 min LE only Standing hip abduction extension 2 x 20 bil  without resistance (hands on table) Neuromuscular re-ed: Corner Rhomberg balance 1 x 30 second with EO, 3 x 30 sec with EC - increased postural sway with EC but was able to maintain balance Standing toe taps on step alternating L/R on 6 inch step 2 x 15 - using SPC in L hand for stability    PATIENT EDUCATION:               10/18/2021 Education details: reviewed POC dropping to 1 x every other week. Plan to focus on deficits and maximize HEP.  Person educated: Patient/ care taker Education method: Explanation Education comprehension: verbalized understanding     HOME EXERCISE PROGRAM: Access Code: [email or web address] URL: [email or web address] Date: 09/26/2021 Prepared by: Starr Lake  Exercises Lower Trunk Rotation - 1 x daily - 7 x weekly - 2 sets - 10 reps Hooklying Clamshell with Resistance - 1 x daily - 7 x weekly - 2 sets - 10 reps Seated March - 1 x daily - 7 x weekly - 2 sets - 10 reps Supine Bridge - 1 x daily - 7 x weekly - 2 sets - 10 reps Sit to Stand - 1 x daily - 7 x weekly - 2 sets - 8 reps Sidelying Hip Abduction - 1 x daily - 7 x weekly - 3 sets - 10 reps Seated Hip Adduction Isometrics with Ball - 1 x daily - 7 x weekly - 2 sets - 10 reps - 5 seconds hold Standing Hip Abduction with Counter Support - 1 x daily - 7 x weekly - 2 sets - 12 reps      ASSESSMENT:   CLINICAL IMPRESSION:  Claudene continues to make great progress with physical therapy reporting decreased pain and improvement overall in mobility and stability. She improvd her Tug score to 14.6 secs on avg, and her 5 x STS to 17 seconds. She did well with stair training to day compated to previous sessions. She is making good progress toward her goals.  Based on her functional progress she would benefit from continued physical therapy to decrease her pain, improve balance/ stability and overall mobility by addressing the deficits listed.   REHAB POTENTIAL: Good   CLINICAL DECISION MAKING: Evolving/moderate complexity   EVALUATION COMPLEXITY: Moderate     GOALS: Goals reviewed with patient? Yes   SHORT TERM GOALS:   STG Name Target Date Goal status  1 Pt to be IND with HEP Baseline:  09/19/2021 Met 09/26/2021    LONG TERM GOALS:    LTG Name Target Date Goal status  1 Increase gross LE strength to >/= 4/5 to promote hip stability  Baseline: 10/10/2021 On going 10/18/2021  2 Pt to be able to walk for >/= 30 min with LRAD with </= supervision for safety Baseline:  10/10/2021 10/10/21 Unable to ambulate 30 min   3 Pt to be able to perform sit to stand with no use of UE >/= 5 x  to demo improvement in funciton/ strength Baseline: 10/10/2021 10/10/21 Able to stand 5x w/o need of UEs but locks knees  4 Pt to increase FOTO to >/= 37% to demo improvement in function Baseline: 10/10/2021 10/10/21 FOTO 45%  5 Pt to reduce TUG by >/= 8 seconds to demo improvement in safety Baseline: initial testing 34 secs 10/10/2021 10/10/21 TUG with cane 19s  6 Pt to be IND with all HEP and is able to maintain and progress her current LOF IND Baseline: 10/10/2021 Ongoing 10/18/2021    PLAN: PT FREQUENCY: 1-2 week or every every other week    PT DURATION: 6 weeks   PLANNED INTERVENTIONS: Therapeutic exercises, Therapeutic activity, Neuro Muscular re-education, Balance training, Gait training, Patient/Family education, Joint mobilization, Stair training, Aquatic Therapy, Dry Needling, Cryotherapy, Moist heat, Taping, and Manual therapy   PLAN FOR NEXT SESSION: F/U on stretching results and resume strengthening activities, Gross hip/ knee strengthening, add core increase reps to maximize endurance. Continued gait/ stair training with SPC in LUE. Standing endurancing  training, response to balance training, continue to promote RLE stability stepping down off a curb, balance training. Core strengthening especially with reaching  Susano Cleckler PT, DPT, LAT, ATC  10/18/21  11:20 AM

## 2021-10-20 ENCOUNTER — Ambulatory Visit (HOSPITAL_COMMUNITY): Payer: Medicare Other | Attending: Cardiology

## 2021-10-20 ENCOUNTER — Other Ambulatory Visit: Payer: Self-pay

## 2021-10-20 DIAGNOSIS — I2692 Saddle embolus of pulmonary artery without acute cor pulmonale: Secondary | ICD-10-CM | POA: Diagnosis not present

## 2021-10-20 LAB — ECHOCARDIOGRAM COMPLETE
Area-P 1/2: 3.42 cm2
S' Lateral: 2.6 cm

## 2021-10-21 NOTE — Progress Notes (Signed)
Cardiology Office Note:    Date:  10/23/2021   ID:  Shannon Jensen, DOB 1928/08/12, MRN [id number]  PCP:  [name], MD  Cardiologist:  None   Referring MD: [name], MD   Chief Complaint  Patient presents with   Congestive Heart Failure   Follow-up    PSVT    Hypertension   Hyperlipidemia    History of Present Illness:    Shannon Jensen is a 88 y.o. female with a hx of recent pulmonary embolism October 2022 (RVSP 43 mmHg), complicated by PSVT resolved after beta blocker therapy, primary hypertension, hyperlipidemia, and chronic diastolic heart failure..   She is here today with her daughter.  Her son is also present on this visit by phone.  She has multiple questions that she is written in her notebook that she has concerned about.  We spent greater than 20 minutes covering multiple questions from the patient and her family.  Dr. Lindi Adie discontinued tamoxifen as a potential cause for pulmonary embolism.  This was done in December.  No DVT was identified.  There was no explanation for the patient's PE other than the possibility of medication related from tamoxifen.  She did have RV strain on CT.  She had moderate pulmonary systolic hypertension at 45 mmHg at the time of acute PE.  While hospitalized she was noted to have recurring episodes of PSVT.  These were for the most part asymptomatic.  Metoprolol was started with improvement.  As noted below, outpatient continuous monitor demonstrated frequent episodes of PSVT lasting up to 45 seconds.  No reported symptoms.  Since last being seen, she has felt well.  She is increasing her physical activity.  Past Medical History:  Diagnosis Date   Anxiety and depression    Arthritis    Breast cancer ([place]) 08/2008   Right Breast Cancer   Breast cancer ([place]) 2019   Left Breast Cancer   Chronic headaches    Cystocele    Diverticulosis    Family history of breast cancer    Family history of colon cancer    Family  history of pancreatic cancer    Family history of prostate cancer    GERD (gastroesophageal reflux disease)    H/O vitamin D deficiency    HLD (hyperlipidemia)    Hypertension    Osteoarthritis    Osteoporosis    Pelvic relaxation    Personal history of radiation therapy 2009   Right Breast Cancer   Pulmonary embolism ([place]) 06/2021   Urinary tract infection 2014    Past Surgical History:  Procedure Laterality Date   [place]   BREAST LUMPECTOMY Right 2009   BREAST LUMPECTOMY Left 2019   BREAST LUMPECTOMY WITH RADIOACTIVE SEED LOCALIZATION Left 11/19/2017   Procedure: BREAST LUMPECTOMY WITH RADIOACTIVE SEED LOCALIZATION;  Surgeon: Rolm Bookbinder, MD;  Location: [facility];  Service: General;  Laterality: Left;   BREAST SURGERY  2009   cancer-lymph node removal   NECK SURGERY     cyst removal    Current Medications: Current Meds  Medication Sig   acetaminophen (TYLENOL) 325 MG tablet Take 650 mg by mouth every 6 (six) hours as needed for moderate pain or headache.    amLODipine (NORVASC) 5 MG tablet Take 1 tablet (5 mg total) by mouth daily.   Cholecalciferol (VITAMIN D) 2000 units CAPS Take 2,000 Units by mouth daily.   metoprolol succinate (TOPROL XL) 25 MG 24  hr tablet Take 1 tablet (25 mg total) by mouth daily.   Multiple Vitamins-Minerals (WOMENS ONE DAILY) TABS Take 1 tablet by mouth daily.     Omega-3 Fatty Acids (FISH OIL) 1200 MG CAPS Take 1,200 mg by mouth daily.    rivaroxaban (XARELTO) 20 MG TABS tablet Take 1 tablet (20 mg total) by mouth daily with supper.   [DISCONTINUED] metoprolol tartrate (LOPRESSOR) 25 MG tablet Take 1 tablet (25 mg total) by mouth 2 (two) times daily.     Allergies:   Celecoxib, Erythromycin, Nabumetone, Nsaids, Oxycodone-acetaminophen, Tyloxapol, and Vioxx [rofecoxib]   Social History   Socioeconomic History   Marital status: Widowed    Spouse name: Not on file   Number of children: 4   Years  of education: 71   Highest education level: 12th grade  Occupational History   Occupation: retired    Fish farm manager: RETIRED  Tobacco Use   Smoking status: Never    Passive exposure: Never   Smokeless tobacco: Never  Vaping Use   Vaping Use: Never used  Substance and Sexual Activity   Alcohol use: No   Drug use: No   Sexual activity: Never  Other Topics Concern   Not on file  Social History Narrative   Not on file   Social Determinants of Health   Financial Resource Strain: Low Risk    Difficulty of Paying Living Expenses: Not hard at all  Food Insecurity: No Food Insecurity   Worried About Charity fundraiser in the Last Year: Never true   [place] in the Last Year: Never true  Transportation Needs: No Transportation Needs   Lack of Transportation (Medical): No   Lack of Transportation (Non-Medical): No  Physical Activity: Inactive   Days of Exercise per Week: 0 days   Minutes of Exercise per Session: 0 min  Stress: No Stress Concern Present   Feeling of Stress : Not at all  Social Connections: Moderately Isolated   Frequency of Communication with Friends and Family: More than three times a week   Frequency of Social Gatherings with Friends and Family: More than three times a week   Attends Religious Services: More than 4 times per year   Active Member of [employer] or Organizations: No   Attends Archivist Meetings: Never   Marital Status: Widowed     Family History: The patient's family history includes Appendicitis in her daughter; Breast cancer in her sister; Colon cancer in her mother; Colon polyps in her daughter; Heart disease in her paternal uncle; Pancreatic cancer in her brother; Pancreatic cancer (age of onset: 43) in her sister; Prostate cancer in her brother and nephew; Prostate cancer (age of onset: 59) in her father; Stroke in her daughter.  ROS:   Please see the history of present illness.    Multiple questions.  She and her family has anxiety  concerning discontinuation of Xarelto because of concern she may have recurrent PE.  She has had no bleeding on anticoagulation therapy.  The continuous monitor that she wore does not demonstrate episodes of atrial fibrillation.  All other systems reviewed and are negative.  EKGs/Labs/Other Studies Reviewed:    The following studies were reviewed today:  Continuous monitor September 22, 2021: Study Highlights    Basic rhythm is normal sinus Frequent SVT (480) with longest episode lasting 58 seconds at 123 bpm. Other shorter episodes with fastest 171 bpm Rare PVC's and < 4 beat ventricular salvos.   Recommendation: Increase metoprolol  to 37.5 mg BID; Continue to measure BP and HR.   Echocardiography 10/2021: IMPRESSIONS     1. Left ventricular ejection fraction, by estimation, is 60 to 65%. Left  ventricular ejection fraction by 3D volume is 64 %. The left ventricle has  normal function. The left ventricle has no regional wall motion  abnormalities. There is mild left  ventricular hypertrophy. Left ventricular diastolic parameters are  consistent with Grade I diastolic dysfunction (impaired relaxation). The  average left ventricular global longitudinal strain is -16.5 %.   2. Right ventricular systolic function is normal. The right ventricular  size is normal. There is mildly elevated pulmonary artery systolic  pressure. The estimated right ventricular systolic pressure is 95.1 mmHg (down from 43 mmHg, 08/2021).   3. Left atrial size was mildly dilated.   4. The mitral valve is normal in structure. Trivial mitral valve  regurgitation. No evidence of mitral stenosis.   5. The aortic valve is tricuspid. Aortic valve regurgitation is not  visualized. No aortic stenosis is present.   6. The inferior vena cava is normal in size with greater than 50%  respiratory variability, suggesting right atrial pressure of 3 mmHg.   Comparison(s): 06/14/21 EF 65-70%. PA pressure 39mmHg.   EKG:  EKG  not repeated  Recent Labs: 06/14/2021: ALT 16 06/15/2021: BUN 14; Creatinine, Ser 1.00; Potassium 4.4; Sodium 131 06/16/2021: Hemoglobin 10.3; Platelets 171 07/20/2021: TSH 1.200  Recent Lipid Panel    Component Value Date/Time   CHOL 193 07/20/2021 1524   TRIG 124 07/20/2021 1524   HDL 50 07/20/2021 1524   CHOLHDL 3.9 07/20/2021 1524   CHOLHDL 3.9 08/27/2016 1545   VLDL 20 08/27/2016 1545   LDLCALC 121 (H) 07/20/2021 1524   LDLDIRECT 142.5 11/08/2008 1022    Physical Exam:    VS:  BP 138/64    Pulse 99    Ht 5\' 2"  (1.575 m)    Wt 149 lb 3.2 oz (67.7 kg)    SpO2 100%    BMI 27.29 kg/m     Wt Readings from Last 3 Encounters:  10/23/21 149 lb 3.2 oz (67.7 kg)  08/31/21 150 lb 3.2 oz (68.1 kg)  08/07/21 151 lb 3.2 oz (68.6 kg)     GEN: Younger than stated age in appearance. No acute distress HEENT: Normal NECK: No JVD. LYMPHATICS: No lymphadenopathy CARDIAC: Soft systolic murmur left midsternal border.  No diastolic murmur. RRR no gallop, or edema. VASCULAR:  Normal Pulses. No bruits. RESPIRATORY:  Clear to auscultation without rales, wheezing or rhonchi  ABDOMEN: Soft, non-tender, non-distended, No pulsatile mass, MUSCULOSKELETAL: No deformity  SKIN: Warm and dry NEUROLOGIC:  Alert and oriented x 3 PSYCHIATRIC:  Normal affect   ASSESSMENT:    1. SVT (supraventricular tachycardia) ([place])   2. Essential hypertension   3. Acute saddle pulmonary embolism without acute cor pulmonale (HCC)   4. Hyperlipidemia LDL goal <130   5. Chronic anticoagulation   6. Aortic atherosclerosis ([place])   7. Other secondary pulmonary hypertension (HCC)    PLAN:    In order of problems listed above:  Continue low-dose beta-blocker therapy for suppression of SVT.  Consolidate metoprolol to succinate preparation 25 mg once per day.  May need 50 mg dose. Target blood pressure range is 140/80 mmHg.  Continue metoprolol and amlodipine. Repeat echo demonstrates normalization of pulmonary artery  systolic pressure.  This was discussed with the patient and family as evidence of resolving clot burden in the lungs. This is not  being treated. Continue Xarelto for at least 6 months post pulmonary embolus.  He will likely be reasonable to discontinue therapy at that time given age. Lipid management with not be appropriate in her age category as it is no data to support use in the 88 year old. Resolved to near normal by repeat echo done this month.  See results above.   Medication Adjustments/Labs and Tests Ordered: Current medicines are reviewed at length with the patient today.  Concerns regarding medicines are outlined above.  No orders of the defined types were placed in this encounter.  Meds ordered this encounter  Medications   metoprolol succinate (TOPROL XL) 25 MG 24 hr tablet    Sig: Take 1 tablet (25 mg total) by mouth daily.    Dispense:  90 tablet    Refill:  3    D/C metoprolol tartrate    Patient Instructions  Medication Instructions:  Your physician has recommended you make the following change in your medication: 1) DISCONTINUE metoprolol tartrate  2) START metoprolol succinate (Toprol XL) 25mg  once daily *If you need a refill on your cardiac medications before your next appointment, please call your pharmacy*   Lab Work: NONE If you have labs (blood work) drawn today and your tests are completely normal, you will receive your results only by: [place] (if you have MyChart) OR A paper copy in the mail If you have any lab test that is abnormal or we need to change your treatment, we will call you to review the results.   Testing/Procedures: NONE   Follow-Up: At [facility], you and your health needs are our priority.  As part of our continuing mission to provide you with exceptional heart care, we have created designated Provider Care Teams.  These Care Teams include your primary Cardiologist (physician) and Advanced Practice Providers (APPs -   Physician Assistants and Nurse Practitioners) who all work together to provide you with the care you need, when you need it.  We recommend signing up for the patient portal called "MyChart".  Sign up information is provided on this After Visit Summary.  MyChart is used to connect with patients for Virtual Visits (Telemedicine).  Patients are able to view lab/test results, encounter notes, upcoming appointments, etc.  Non-urgent messages can be sent to your provider as well.   To learn more about what you can do with MyChart, go to [email or web address].    Your next appointment:   6-8 month(s)  The format for your next appointment:   In Person  Provider:   Daneen Schick, MD     Signed, Sinclair Grooms, MD  10/23/2021 5:22 PM    [name]

## 2021-10-23 ENCOUNTER — Other Ambulatory Visit: Payer: Self-pay

## 2021-10-23 ENCOUNTER — Encounter: Payer: Self-pay | Admitting: Interventional Cardiology

## 2021-10-23 ENCOUNTER — Ambulatory Visit (INDEPENDENT_AMBULATORY_CARE_PROVIDER_SITE_OTHER): Payer: Medicare Other | Admitting: Interventional Cardiology

## 2021-10-23 VITALS — BP 138/64 | HR 99 | Ht 62.0 in | Wt 149.2 lb

## 2021-10-23 DIAGNOSIS — I2729 Other secondary pulmonary hypertension: Secondary | ICD-10-CM | POA: Diagnosis not present

## 2021-10-23 DIAGNOSIS — I2692 Saddle embolus of pulmonary artery without acute cor pulmonale: Secondary | ICD-10-CM

## 2021-10-23 DIAGNOSIS — I471 Supraventricular tachycardia: Secondary | ICD-10-CM | POA: Diagnosis not present

## 2021-10-23 DIAGNOSIS — I700 Atherosclerosis of aorta: Secondary | ICD-10-CM | POA: Diagnosis not present

## 2021-10-23 DIAGNOSIS — E785 Hyperlipidemia, unspecified: Secondary | ICD-10-CM | POA: Diagnosis not present

## 2021-10-23 DIAGNOSIS — Z7901 Long term (current) use of anticoagulants: Secondary | ICD-10-CM

## 2021-10-23 DIAGNOSIS — I10 Essential (primary) hypertension: Secondary | ICD-10-CM | POA: Diagnosis not present

## 2021-10-23 MED ORDER — METOPROLOL SUCCINATE ER 25 MG PO TB24: 25.0000 mg | ORAL_TABLET | Freq: Every day | ORAL | 3 refills | Status: DC

## 2021-10-23 NOTE — Patient Instructions (Signed)
Medication Instructions:  Your physician has recommended you make the following change in your medication: 1) DISCONTINUE metoprolol tartrate  2) START metoprolol succinate (Toprol XL) 25mg  once daily *If you need a refill on your cardiac medications before your next appointment, please call your pharmacy*   Lab Work: NONE If you have labs (blood work) drawn today and your tests are completely normal, you will receive your results only by: [place] (if you have MyChart) OR A paper copy in the mail If you have any lab test that is abnormal or we need to change your treatment, we will call you to review the results.   Testing/Procedures: NONE   Follow-Up: At [facility], you and your health needs are our priority.  As part of our continuing mission to provide you with exceptional heart care, we have created designated Provider Care Teams.  These Care Teams include your primary Cardiologist (physician) and Advanced Practice Providers (APPs -  Physician Assistants and Nurse Practitioners) who all work together to provide you with the care you need, when you need it.  We recommend signing up for the patient portal called "MyChart".  Sign up information is provided on this After Visit Summary.  MyChart is used to connect with patients for Virtual Visits (Telemedicine).  Patients are able to view lab/test results, encounter notes, upcoming appointments, etc.  Non-urgent messages can be sent to your provider as well.   To learn more about what you can do with MyChart, go to [email or web address].    Your next appointment:   6-8 month(s)  The format for your next appointment:   In Person  Provider:   Daneen Schick, MD

## 2021-10-24 ENCOUNTER — Telehealth: Payer: Self-pay | Admitting: Interventional Cardiology

## 2021-10-24 NOTE — Telephone Encounter (Signed)
Patient is requesting to speak with Anderson Malta, B. She states she will not be home tomorrow and would like for her call to be returned on 2/16 if she is unable to speak with Anderson Malta this afternoon. She declined discussing with me.

## 2021-10-25 ENCOUNTER — Encounter: Payer: Self-pay | Admitting: *Deleted

## 2021-10-25 ENCOUNTER — Other Ambulatory Visit: Payer: Self-pay

## 2021-10-25 ENCOUNTER — Ambulatory Visit: Payer: Medicare Other | Admitting: Physical Therapy

## 2021-10-25 DIAGNOSIS — M6281 Muscle weakness (generalized): Secondary | ICD-10-CM

## 2021-10-25 DIAGNOSIS — G8929 Other chronic pain: Secondary | ICD-10-CM

## 2021-10-25 DIAGNOSIS — M25552 Pain in left hip: Secondary | ICD-10-CM

## 2021-10-25 DIAGNOSIS — M25561 Pain in right knee: Secondary | ICD-10-CM | POA: Diagnosis not present

## 2021-10-25 NOTE — Telephone Encounter (Signed)
Spoke with pt and she wanted to ask a few more questions and get Dr. Thompson Caul opinion.  She wanted to know about what kinds of foods she should be eating.  We discussed the DASH and Mediterranean diet.  Advised I will send her some information on these.  She also asked what kind of activities she should avoid.  We discussed doing more aerobic activities and avoiding isometric activities.  She also inquired about reflux meds.  Advised her to speak with PCP about what she could use since she mentioned already trying OTC meds including Prilosec, Nexium, Pepcid and Gaviscon.  Pt very appreciative for assistance.

## 2021-10-25 NOTE — Telephone Encounter (Signed)
Attempted to contact pt.  VM said "memory full".  Unable to leave message.

## 2021-10-25 NOTE — Telephone Encounter (Signed)
Patient was returning call 

## 2021-10-25 NOTE — Therapy (Signed)
OUTPATIENT PHYSICAL THERAPY TREATMENT NOTE   Patient Name: Shannon Jensen MRN: [id number] DOB:Apr 05, 1928, 88 y.o., female Today's Date: 10/25/2021  PCP: [name], MD REFERRING PROVIDER: [name], MD   PT End of Session - 10/25/21 1035     Visit Number 12    Number of Visits 16    Date for PT Re-Evaluation 12/13/21    Authorization Type [employer] MCR - KX mod at 15th visit    Progress Note Due on Visit 20    PT Start Time 1018    PT Stop Time 1100    PT Time Calculation (min) 42 min    Equipment Utilized During Treatment Gait belt    Activity Tolerance Patient tolerated treatment well    Behavior During Therapy WFL for tasks assessed/performed                    Past Medical History:  Diagnosis Date   Anxiety and depression    Arthritis    Breast cancer ([place]) 08/2008   Right Breast Cancer   Breast cancer ([place]) 2019   Left Breast Cancer   Chronic headaches    Cystocele    Diverticulosis    Family history of breast cancer    Family history of colon cancer    Family history of pancreatic cancer    Family history of prostate cancer    GERD (gastroesophageal reflux disease)    H/O vitamin D deficiency    HLD (hyperlipidemia)    Hypertension    Osteoarthritis    Osteoporosis    Pelvic relaxation    Personal history of radiation therapy 2009   Right Breast Cancer   Pulmonary embolism ([place]) 06/2021   Urinary tract infection 2014   Past Surgical History:  Procedure Laterality Date   [place]   BREAST LUMPECTOMY Right 2009   BREAST LUMPECTOMY Left 2019   BREAST LUMPECTOMY WITH RADIOACTIVE SEED LOCALIZATION Left 11/19/2017   Procedure: BREAST LUMPECTOMY WITH RADIOACTIVE SEED LOCALIZATION;  Surgeon: Rolm Bookbinder, MD;  Location: [place];  Service: General;  Laterality: Left;   BREAST SURGERY  2009   cancer-lymph node removal   NECK SURGERY     cyst removal   Patient Active Problem List    Diagnosis Date Noted   Chest pain at rest 06/13/2021   SVT (supraventricular tachycardia) ([place])    Acute saddle pulmonary embolism ([place])    Enlarged thyroid 06/23/2019   Genetic testing 10/03/2018   Family history of breast cancer    Family history of pancreatic cancer    Family history of prostate cancer    Presbycusis of both ears 08/25/2018   Laryngopharyngeal reflux (LPR) 06/10/2018   Malignant neoplasm of upper-inner quadrant of left breast in female, estrogen receptor positive ([place]) 09/24/2017   Internal hemorrhoids 09/23/2014   Family history of colon cancer 06/30/2014   History of colonic polyps 06/30/2014   GERD (gastroesophageal reflux disease) 08/27/2012   Diverticulosis    Osteoporosis    Pelvic relaxation    Cystocele    H/O vitamin D deficiency    Hyperlipidemia LDL goal <130 11/08/2008   Essential hypertension 04/13/2008   Osteoarthritis 04/13/2008    REFERRING DIAG: Osteoarthritis, unspecified osteoarthritis type, unspecified site [M19.90]   THERAPY DIAG:  Muscle weakness (generalized)  Pain in left hip  Chronic pain of right knee  PERTINENT HISTORY: Hx of pulmonary embolism, OA, osteoporosis   PRECAUTIONS: Hx of pulmonary embolism, OA,  osteoporosis   SUBJECTIVE: "my back has been bothering me more on the L side of my back."  PAIN:  Are you having pain? Yes VAS scale: 4/10 Pain location: L hip and R Knee PAIN TYPE: sore Aggravating factors: standing/ walking Relieving factors: standing/ walking    OBJECTIVE:   **Measurements captured at evaluation unless otherwise noted by date** DIAGNOSTIC FINDINGS: N/A   PATIENT SURVEYS:  FOTO 31% function, predicted 37%        09/21/2021  FOTO 39%           10/10/21    FOTO 45%   COGNITION:          Overall cognitive status: Within functional limits for tasks assessed                POSTURE:  FHP,   PALPATION: R knee TTP along the lateral joint, and along the patellar tendon and distal hamstrings.   L hip, TTP along the glute med/ minimus with multiple trigger points. Low back L SIJ and lumbar paraspinal tenderness    LE AROM/PROM:   A/PROM Right 08/29/2021 Left 08/29/2021  Hip flexion WNL WNL  Hip extension WNL WNL  Hip abduction WNL WNL  Hip adduction      Hip internal rotation      Hip external rotation      Knee flexion      Knee extension      Ankle dorsiflexion      Ankle plantarflexion      Ankle inversion      Ankle eversion       (Blank rows = not tested)   LE MMT:   MMT Right 08/29/2021 Left 08/29/2021 Right  10/18/2021 Left     10/18/2021  Hip flexion 4 4- 4 4  Hip extension 3+ 3+ 3+ 3+  Hip abduction 3+ 3+ 4- 4-  Hip adduction 3+ 3+ 4- 4-  Hip internal rotation        Hip external rotation        Knee flexion 3+ 4-  4 4  Knee extension 3+ 4- 4 4  Ankle dorsiflexion        Ankle plantarflexion        Ankle inversion        Ankle eversion         (Blank rows = not tested)   LOWER EXTREMITY SPECIAL TESTS:      FUNCTIONAL TESTS:   10/18/2021 5 times sit to stand: 17 seconds Timed up and go (TUG): 17 sec, 14 sec, 13 sec.  14.6 seconds avg   GAIT: Distance walked: into clinic Assistive device utilized: Single point cane Level of assistance: CGA Comments: antalgic, R knee instability in stance phase        TODAY'S TREATMENT:  [facility] Adult PT Treatment:                                                DATE: 10/25/2021 Therapeutic Exercise: PPT 1 x 10 holding 5 seconds Supine marching 2 x 20 alternate L/R while maintaining posterior pelvic tilt with abdominal draw in maneuver Bil UE press on physioball 2 x 12 using and red physioball L hamstring stretch 2 x 30 sec in sitting Seated marching LLE 2 x 10 with BTB around knees SLR 2 x 10 with LLE in sitting  Neuro Re-ed:  Gait training with head turns 4 x 20 ft with SPC and gait belt with CGA for safety  Therapeutic Activity: Going up/ down a 6 inch curb with proper form  Stepping over 1/2 bolster  and turning around x 6   [facility] Adult PT Treatment:                                                DATE: 10/18/2021 Therapeutic Exercise: Nu-step L5 x 6 min UE/LE 5 x sit to stand 17 seconds Tug assessment avg 14.6 sec  Therapeutic Activity: Navigating stairs on 6 inch steps using SPC in L hand. Verbal cues for proper form  Self Care Reviewed sleeping posture to reduce low back pain   [facility] Adult PT Treatment:                                                DATE: 10/10/21 Therapeutic Exercise: Nustep L2 6 min, seat 4 arms 6 Step ups 4", 5x FWD, 5x sidestep, bilateral  Therapeutic Activity: 5x STS w/o UE  TUG w/cane 19s Corner stand tandem EO/EC, 30s each position, narrow BOS EO/EC 30s, head turns/nods, narrow BOS 5x each in EO/EC position    PATIENT EDUCATION:               10/18/2021 Education details: reviewed POC dropping to 1 x every other week. Plan to focus on deficits and maximize HEP.  Person educated: Patient/ care taker Education method: Explanation Education comprehension: verbalized understanding     HOME EXERCISE PROGRAM: Access Code: [email or web address] URL: [email or web address] Date: 09/26/2021 Prepared by: Starr Lake  Exercises Lower Trunk Rotation - 1 x daily - 7 x weekly - 2 sets - 10 reps Hooklying Clamshell with Resistance - 1 x daily - 7 x weekly - 2 sets - 10 reps Seated March - 1 x daily - 7 x weekly - 2 sets - 10 reps Supine Bridge - 1 x daily - 7 x weekly - 2 sets - 10 reps Sit to Stand - 1 x daily - 7 x weekly - 2 sets - 8 reps Sidelying Hip Abduction - 1 x daily - 7 x weekly - 3 sets - 10 reps Seated Hip Adduction Isometrics with Ball - 1 x daily - 7 x weekly - 2 sets - 10 reps - 5 seconds hold Standing Hip Abduction with Counter Support - 1 x daily - 7 x weekly - 2 sets - 12 reps      ASSESSMENT:   CLINICAL IMPRESSION:  Amarrah continues to do very well with physical therapy focused on functional mobility with navigating up/ down  a 6 inch curb. She did well with practicing stepping over obstacles and walking with turning her head to the L/R. Focused remainder of core strengthening, end of session she did report reduction in pain in the low back.   REHAB POTENTIAL: Good   CLINICAL DECISION MAKING: Evolving/moderate complexity   EVALUATION COMPLEXITY: Moderate     GOALS: Goals reviewed with patient? Yes   SHORT TERM GOALS:   STG Name Target Date Goal status  1 Pt to be IND with HEP Baseline:  09/19/2021 Met 09/26/2021    LONG TERM GOALS:    LTG Name  Target Date Goal status  1 Increase gross LE strength to >/= 4/5 to promote hip stability  Baseline: 10/10/2021 On going 10/18/2021  2 Pt to be able to walk for >/= 30 min with LRAD with </= supervision for safety Baseline: 10/10/2021 10/10/21 Unable to ambulate 30 min   3 Pt to be able to perform sit to stand with no use of UE >/= 5 x  to demo improvement in funciton/ strength Baseline: 10/10/2021 10/10/21 Able to stand 5x w/o need of UEs but locks knees  4 Pt to increase FOTO to >/= 37% to demo improvement in function Baseline: 10/10/2021 10/10/21 FOTO 45%  5 Pt to reduce TUG by >/= 8 seconds to demo improvement in safety Baseline: initial testing 34 secs 10/10/2021 10/10/21 TUG with cane 19s  6 Pt to be IND with all HEP and is able to maintain and progress her current LOF IND Baseline: 10/10/2021 Ongoing 10/18/2021    PLAN: PT FREQUENCY: 1-2 week or every every other week    PT DURATION: 6 weeks   PLANNED INTERVENTIONS: Therapeutic exercises, Therapeutic activity, Neuro Muscular re-education, Balance training, Gait training, Patient/Family education, Joint mobilization, Stair training, Aquatic Therapy, Dry Needling, Cryotherapy, Moist heat, Taping, and Manual therapy   PLAN FOR NEXT SESSION: F/U on stretching results and resume strengthening activities, Gross hip/ knee strengthening, add core increase reps to maximize endurance. Continued gait/ stair training with SPC  in LUE. Standing endurancing training, response to balance training, continue to promote RLE stability stepping down off a curb, balance training. Core strengthening especially with reaching   Kourtnee Lahey PT, DPT, LAT, ATC  10/25/21  11:10 AM

## 2021-10-31 ENCOUNTER — Other Ambulatory Visit: Payer: Self-pay

## 2021-10-31 NOTE — Patient Outreach (Signed)
[place] [facility]) Care Management  10/31/2021  KARISTA AISPURO 1927-09-16 [id number]   Telephone call to patient for disease management follow up.   No answer.  HIPAA compliant voice message left.    Plan: If no return call, RN CM will attempt patient again in the month of May.  Jone Baseman, RN, MSN [facility] Care Management Care Management Coordinator Direct Line [phone] Toll Free: [phone]  Fax: [phone]

## 2021-11-07 ENCOUNTER — Other Ambulatory Visit: Payer: Self-pay

## 2021-11-07 ENCOUNTER — Encounter: Payer: Self-pay | Admitting: Physical Therapy

## 2021-11-07 ENCOUNTER — Ambulatory Visit: Payer: Medicare Other | Admitting: Physical Therapy

## 2021-11-07 DIAGNOSIS — G8929 Other chronic pain: Secondary | ICD-10-CM

## 2021-11-07 DIAGNOSIS — M6281 Muscle weakness (generalized): Secondary | ICD-10-CM | POA: Diagnosis not present

## 2021-11-07 DIAGNOSIS — M25561 Pain in right knee: Secondary | ICD-10-CM | POA: Diagnosis not present

## 2021-11-07 DIAGNOSIS — M25552 Pain in left hip: Secondary | ICD-10-CM

## 2021-11-07 NOTE — Therapy (Signed)
OUTPATIENT PHYSICAL THERAPY TREATMENT NOTE   Patient Name: Shannon Jensen MRN: [id number] DOB:05/21/1928, 88 y.o., female Today's Date: 11/07/2021  PCP: [name], MD REFERRING PROVIDER: [name], MD   PT End of Session - 11/07/21 1030     Visit Number 13    Number of Visits 16    Date for PT Re-Evaluation 12/13/21    Authorization Type [employer] MCR - KX mod at 15th visit    Progress Note Due on Visit 20    PT Start Time 1016    PT Stop Time 1100    PT Time Calculation (min) 44 min    Equipment Utilized During Treatment Gait belt    Activity Tolerance Patient tolerated treatment well    Behavior During Therapy WFL for tasks assessed/performed                     Past Medical History:  Diagnosis Date   Anxiety and depression    Arthritis    Breast cancer ([place]) 08/2008   Right Breast Cancer   Breast cancer ([place]) 2019   Left Breast Cancer   Chronic headaches    Cystocele    Diverticulosis    Family history of breast cancer    Family history of colon cancer    Family history of pancreatic cancer    Family history of prostate cancer    GERD (gastroesophageal reflux disease)    H/O vitamin D deficiency    HLD (hyperlipidemia)    Hypertension    Osteoarthritis    Osteoporosis    Pelvic relaxation    Personal history of radiation therapy 2009   Right Breast Cancer   Pulmonary embolism ([place]) 06/2021   Urinary tract infection 2014   Past Surgical History:  Procedure Laterality Date   [place]   BREAST LUMPECTOMY Right 2009   BREAST LUMPECTOMY Left 2019   BREAST LUMPECTOMY WITH RADIOACTIVE SEED LOCALIZATION Left 11/19/2017   Procedure: BREAST LUMPECTOMY WITH RADIOACTIVE SEED LOCALIZATION;  Surgeon: Rolm Bookbinder, MD;  Location: [place];  Service: General;  Laterality: Left;   BREAST SURGERY  2009   cancer-lymph node removal   NECK SURGERY     cyst removal   Patient Active Problem List    Diagnosis Date Noted   Chest pain at rest 06/13/2021   SVT (supraventricular tachycardia) ([place])    Acute saddle pulmonary embolism ([place])    Enlarged thyroid 06/23/2019   Genetic testing 10/03/2018   Family history of breast cancer    Family history of pancreatic cancer    Family history of prostate cancer    Presbycusis of both ears 08/25/2018   Laryngopharyngeal reflux (LPR) 06/10/2018   Malignant neoplasm of upper-inner quadrant of left breast in female, estrogen receptor positive ([place]) 09/24/2017   Internal hemorrhoids 09/23/2014   Family history of colon cancer 06/30/2014   History of colonic polyps 06/30/2014   GERD (gastroesophageal reflux disease) 08/27/2012   Diverticulosis    Osteoporosis    Pelvic relaxation    Cystocele    H/O vitamin D deficiency    Hyperlipidemia LDL goal <130 11/08/2008   Essential hypertension 04/13/2008   Osteoarthritis 04/13/2008    REFERRING DIAG: Osteoarthritis, unspecified osteoarthritis type, unspecified site [M19.90]   THERAPY DIAG:  Muscle weakness (generalized)  Pain in left hip  Chronic pain of right knee  PERTINENT HISTORY: Hx of pulmonary embolism, OA, osteoporosis   PRECAUTIONS: Hx of pulmonary embolism,  OA, osteoporosis   SUBJECTIVE: "I'm present today. Back is hurting some today."  PAIN:  Are you having pain? Yes VAS scale: 5/10 Pain location: R knee > L knee, back PAIN TYPE: aching Aggravating factors: standing/ walking Relieving factors: standing/ walking    OBJECTIVE:   **Measurements captured at evaluation unless otherwise noted by date** DIAGNOSTIC FINDINGS: N/A   PATIENT SURVEYS:  FOTO 31% function, predicted 37%        09/21/2021  FOTO 39%           10/10/21    FOTO 45%   COGNITION:          Overall cognitive status: Within functional limits for tasks assessed                POSTURE:  FHP,   PALPATION: R knee TTP along the lateral joint, and along the patellar tendon and distal hamstrings.  L hip,  TTP along the glute med/ minimus with multiple trigger points. Low back L SIJ and lumbar paraspinal tenderness    LE AROM/PROM:   A/PROM Right 08/29/2021 Left 08/29/2021  Hip flexion WNL WNL  Hip extension WNL WNL  Hip abduction WNL WNL  Hip adduction      Hip internal rotation      Hip external rotation      Knee flexion      Knee extension      Ankle dorsiflexion      Ankle plantarflexion      Ankle inversion      Ankle eversion       (Blank rows = not tested)   LE MMT:   MMT Right 08/29/2021 Left 08/29/2021 Right  10/18/2021 Left     10/18/2021  Hip flexion 4 4- 4 4  Hip extension 3+ 3+ 3+ 3+  Hip abduction 3+ 3+ 4- 4-  Hip adduction 3+ 3+ 4- 4-  Hip internal rotation        Hip external rotation        Knee flexion 3+ 4-  4 4  Knee extension 3+ 4- 4 4  Ankle dorsiflexion        Ankle plantarflexion        Ankle inversion        Ankle eversion         (Blank rows = not tested)   LOWER EXTREMITY SPECIAL TESTS:      FUNCTIONAL TESTS:   10/18/2021 5 times sit to stand: 17 seconds Timed up and go (TUG): 17 sec, 14 sec, 13 sec.  14.6 seconds avg   GAIT: Distance walked: into clinic Assistive device utilized: Single point cane Level of assistance: CGA Comments: antalgic, R knee instability in stance phase        TODAY'S TREATMENT:  [facility] Adult PT Treatment:                                                DATE: 11/07/21 Therapeutic Exercise: Nu-Step level 5 x6 minutes UE/LE  Standing marches with UE GTB pull for core strengthening 2x10 STS from standard chair 2x5 - notes bil knee pain when lowering down, added green TB around knees to limit genu valgus on 2nd set Supine knee rockers x15 Discussed continuing HEP and provided green TB to use with sit to stands.  Neuromuscular re-ed: PPT 10 x 5 sec holds with tactile  and verbal cues for form PPT with LE marching 2x12 Therapeutic Activity: Stepping over yellow hurdles and airex obstacle course in parallel bars  with gait belt and CGA x2 Step on and off 6 inch step to mimic curbs in parallel bars with gait belt and CGA    [facility] Adult PT Treatment:                                                DATE: 10/25/2021 Therapeutic Exercise: PPT 1 x 10 holding 5 seconds Supine marching 2 x 20 alternate L/R while maintaining posterior pelvic tilt with abdominal draw in maneuver Bil UE press on physioball 2 x 12 using and red physioball L hamstring stretch 2 x 30 sec in sitting Seated marching LLE 2 x 10 with BTB around knees SLR 2 x 10 with LLE in sitting  Neuro Re-ed: Gait training with head turns 4 x 20 ft with SPC and gait belt with CGA for safety  Therapeutic Activity: Going up/ down a 6 inch curb with proper form  Stepping over 1/2 bolster and turning around x 6   [facility] Adult PT Treatment:                                                DATE: 10/18/2021 Therapeutic Exercise: Nu-step L5 x 6 min UE/LE 5 x sit to stand 17 seconds Tug assessment avg 14.6 sec  Therapeutic Activity: Navigating stairs on 6 inch steps using SPC in L hand. Verbal cues for proper form  Self Care Reviewed sleeping posture to reduce low back pain       PATIENT EDUCATION:               10/18/2021 Education details: reviewed POC dropping to 1 x every other week. Plan to focus on deficits and maximize HEP.  Person educated: Patient/ care taker Education method: Explanation Education comprehension: verbalized understanding     HOME EXERCISE PROGRAM: Access Code: [email or web address] URL: [email or web address] Date: 09/26/2021 Prepared by: Starr Lake  Exercises Lower Trunk Rotation - 1 x daily - 7 x weekly - 2 sets - 10 reps Hooklying Clamshell with Resistance - 1 x daily - 7 x weekly - 2 sets - 10 reps Seated March - 1 x daily - 7 x weekly - 2 sets - 10 reps Supine Bridge - 1 x daily - 7 x weekly - 2 sets - 10 reps Sit to Stand - 1 x daily - 7 x weekly - 2 sets - 8 reps Sidelying Hip Abduction - 1 x daily  - 7 x weekly - 3 sets - 10 reps Seated Hip Adduction Isometrics with Ball - 1 x daily - 7 x weekly - 2 sets - 10 reps - 5 seconds hold Standing Hip Abduction with Counter Support - 1 x daily - 7 x weekly - 2 sets - 12 reps      ASSESSMENT:   CLINICAL IMPRESSION:  Kalandra tolerated PT session very well today. Today's session focused on core strengthening and safe community ambulation. She requires frequent verbal cues to maintain proper form on core exercises. She hesitated to initially perform the obstacle course of stepping over hurdles, but she was able to perform with CGA and  a gait belt. She demonstrated a slight LOB when stepping onto the airex pad, but she was able to recover her balance with min assist from PT.   REHAB POTENTIAL: Good   CLINICAL DECISION MAKING: Evolving/moderate complexity   EVALUATION COMPLEXITY: Moderate     GOALS: Goals reviewed with patient? Yes   SHORT TERM GOALS:   STG Name Target Date Goal status  1 Pt to be IND with HEP Baseline:  09/19/2021 Met 09/26/2021    LONG TERM GOALS:    LTG Name Target Date Goal status  1 Increase gross LE strength to >/= 4/5 to promote hip stability  Baseline: 10/10/2021 On going 10/18/2021  2 Pt to be able to walk for >/= 30 min with LRAD with </= supervision for safety Baseline: 10/10/2021 10/10/21 Unable to ambulate 30 min   3 Pt to be able to perform sit to stand with no use of UE >/= 5 x  to demo improvement in funciton/ strength Baseline: 10/10/2021 10/10/21 Able to stand 5x w/o need of UEs but locks knees  4 Pt to increase FOTO to >/= 37% to demo improvement in function Baseline: 10/10/2021 10/10/21 FOTO 45%  5 Pt to reduce TUG by >/= 8 seconds to demo improvement in safety Baseline: initial testing 34 secs 10/10/2021 10/10/21 TUG with cane 19s  6 Pt to be IND with all HEP and is able to maintain and progress her current LOF IND Baseline: 10/10/2021 Ongoing 10/18/2021    PLAN: PT FREQUENCY: 1-2 week or every every  other week    PT DURATION: 6 weeks   PLANNED INTERVENTIONS: Therapeutic exercises, Therapeutic activity, Neuro Muscular re-education, Balance training, Gait training, Patient/Family education, Joint mobilization, Stair training, Aquatic Therapy, Dry Needling, Cryotherapy, Moist heat, Taping, and Manual therapy   PLAN FOR NEXT SESSION: F/U on stretching results and resume strengthening activities, Gross hip/ knee strengthening, add core increase reps to maximize endurance. Continued gait/ stair training with SPC in LUE. Standing endurancing training, response to balance training, continue to promote RLE stability stepping down off a curb, balance training. Core strengthening especially with reaching   Edythe Lynn, PT, DPT 11/07/21 12:07 PM

## 2021-11-21 ENCOUNTER — Other Ambulatory Visit: Payer: Self-pay

## 2021-11-21 ENCOUNTER — Encounter: Payer: Self-pay | Admitting: Physical Therapy

## 2021-11-21 ENCOUNTER — Ambulatory Visit: Payer: Medicare Other | Attending: Family Medicine | Admitting: Physical Therapy

## 2021-11-21 DIAGNOSIS — G8929 Other chronic pain: Secondary | ICD-10-CM | POA: Diagnosis not present

## 2021-11-21 DIAGNOSIS — M6281 Muscle weakness (generalized): Secondary | ICD-10-CM | POA: Insufficient documentation

## 2021-11-21 DIAGNOSIS — M25552 Pain in left hip: Secondary | ICD-10-CM | POA: Diagnosis not present

## 2021-11-21 DIAGNOSIS — M25561 Pain in right knee: Secondary | ICD-10-CM | POA: Insufficient documentation

## 2021-11-21 NOTE — Therapy (Addendum)
?OUTPATIENT PHYSICAL THERAPY TREATMENT / DISCHARGE NOTE ? ? ?Patient Name: Shannon Jensen ?MRN: [id number] ?DOB:1928-02-24, 88 y.o., female ?Today's Date: 11/21/2021 ? ?PCP: [name], MD ?REFERRING PROVIDER: [name], MD ? ? PT End of Session - 11/21/21 1101   ? ? Visit Number 14   ? Number of Visits 16   ? Date for PT Re-Evaluation 12/13/21   ? Authorization Type [employer] MCR - KX mod at 15th visit   ? PT Start Time 1015   ? PT Stop Time 1100   ? PT Time Calculation (min) 45 min   ? Activity Tolerance Patient tolerated treatment well   ? Behavior During Therapy [facility] for tasks assessed/performed   ? ?  ?  ? ?  ? ? ? ? ? ? ? ? ? ? ? ?Past Medical History:  ?Diagnosis Date  ? Anxiety and depression   ? Arthritis   ? Breast cancer ([place]) 08/2008  ? Right Breast Cancer  ? Breast cancer ([place]) 2019  ? Left Breast Cancer  ? Chronic headaches   ? Cystocele   ? Diverticulosis   ? Family history of breast cancer   ? Family history of colon cancer   ? Family history of pancreatic cancer   ? Family history of prostate cancer   ? GERD (gastroesophageal reflux disease)   ? H/O vitamin D deficiency   ? HLD (hyperlipidemia)   ? Hypertension   ? Osteoarthritis   ? Osteoporosis   ? Pelvic relaxation   ? Personal history of radiation therapy 2009  ? Right Breast Cancer  ? Pulmonary embolism ([place]) 06/2021  ? Urinary tract infection 2014  ? ?Past Surgical History:  ?Procedure Laterality Date  ? ABDOMINAL HYSTERECTOMY  1998  ? APPENDECTOMY  1970  ? BREAST LUMPECTOMY Right 2009  ? BREAST LUMPECTOMY Left 2019  ? BREAST LUMPECTOMY WITH RADIOACTIVE SEED LOCALIZATION Left 11/19/2017  ? Procedure: BREAST LUMPECTOMY WITH RADIOACTIVE SEED LOCALIZATION;  Surgeon: Rolm Bookbinder, MD;  Location: [facility];  Service: General;  Laterality: Left;  ? BREAST SURGERY  2009  ? cancer-lymph node removal  ? NECK SURGERY    ? cyst removal  ? ?Patient Active Problem List  ? Diagnosis Date Noted  ? Chest pain at rest 06/13/2021  ? SVT  (supraventricular tachycardia) ([place])   ? Acute saddle pulmonary embolism (HCC)   ? Enlarged thyroid 06/23/2019  ? Genetic testing 10/03/2018  ? Family history of breast cancer   ? Family history of pancreatic cancer   ? Family history of prostate cancer   ? Presbycusis of both ears 08/25/2018  ? Laryngopharyngeal reflux (LPR) 06/10/2018  ? Malignant neoplasm of upper-inner quadrant of left breast in female, estrogen receptor positive ([place]) 09/24/2017  ? Internal hemorrhoids 09/23/2014  ? Family history of colon cancer 06/30/2014  ? History of colonic polyps 06/30/2014  ? GERD (gastroesophageal reflux disease) 08/27/2012  ? Diverticulosis   ? Osteoporosis   ? Pelvic relaxation   ? Cystocele   ? H/O vitamin D deficiency   ? Hyperlipidemia LDL goal <130 11/08/2008  ? Essential hypertension 04/13/2008  ? Osteoarthritis 04/13/2008  ? ? ?REFERRING DIAG: Osteoarthritis, unspecified osteoarthritis type, unspecified site [M19.90]  ? ?THERAPY DIAG:  ?No diagnosis found. ? ?PERTINENT HISTORY: Hx of pulmonary embolism, OA, osteoporosis  ? ?PRECAUTIONS: Hx of pulmonary embolism, OA, osteoporosis  ? ?SUBJECTIVE: "I am doing pretty good." ? ?PAIN:  ?Are you having pain? No ? ? ? ? ?OBJECTIVE:  ? **Measurements  captured at evaluation unless otherwise noted by date** ?DIAGNOSTIC FINDINGS: N/A ?  ?PATIENT SURVEYS:  ?FOTO 31% function, predicted 37% ?       09/21/2021  FOTO 39%  ?         10/10/21    FOTO 45% ?  ?COGNITION: ?         Overall cognitive status: Within functional limits for tasks assessed              ?  ?POSTURE:  ?FHP, ?  ?PALPATION: ?R knee TTP along the lateral joint, and along the patellar tendon and distal hamstrings.  ?L hip, TTP along the glute med/ minimus with multiple trigger points. ?Low back L SIJ and lumbar paraspinal tenderness  ?  ?LE AROM/PROM: ?  ?A/PROM Right ?08/29/2021 Left ?08/29/2021  ?Hip flexion WNL WNL  ?Hip extension WNL WNL  ?Hip abduction WNL WNL  ?Hip adduction      ?Hip internal rotation       ?Hip external rotation      ?Knee flexion      ?Knee extension      ?Ankle dorsiflexion      ?Ankle plantarflexion      ?Ankle inversion      ?Ankle eversion      ? (Blank rows = not tested) ?  ?LE MMT: ?  ?MMT Right ?08/29/2021 Left ?08/29/2021 Right  10/18/2021 Left     10/18/2021 Right 11/21/2021 Left  11/21/2021  ?Hip flexion 4 4- 4 4 4+ 4+  ?Hip extension 3+ 3+ 3+ 3+ 4 4  ?Hip abduction 3+ 3+ 4- 4- 4 4  ?Hip adduction 3+ 3+ 4- 4- 4 4  ?Hip internal rotation          ?Hip external rotation          ?Knee flexion 3+ 4-  4 4 4+ 4+  ?Knee extension 3+ 4- 4 4 4+ 4+  ?Ankle dorsiflexion          ?Ankle plantarflexion          ?Ankle inversion          ?Ankle eversion          ? (Blank rows = not tested) ?  ?LOWER EXTREMITY SPECIAL TESTS:  ?  ?  ?FUNCTIONAL TESTS:    ?10/18/2021 5 times sit to stand: 17 seconds ?11/21/2021 17 seconds ?Timed up and go (TUG): 17 sec, 14 sec, 13 sec.  14.6 seconds avg ?  ?GAIT: ?Distance walked: into clinic ?Assistive device utilized: Single point cane ?Level of assistance: CGA ?Comments: antalgic, R knee instability in stance phase ?  ? ?  ?TODAY'S TREATMENT: ? ?[facility] Adult PT Treatment:                                                DATE: 11/21/2021 ?Therapeutic Exercise: ?Nu step L 5 x 5 min  ? ?Time taken to extensively review HEP with progression ? ?Therapeutic Activity: ?Reviewed pt's current strength and mobility and how it relates to her overall funciton and general safety, as well as how it can be improved upon with continued exercise and consistency with her HEP. ? ? ? ?[facility] Adult PT Treatment:  DATE: 11/07/21 ?Therapeutic Exercise: ?Nu-Step level 5 x6 minutes UE/LE  ?Standing marches with UE GTB pull for core strengthening 2x10 ?STS from standard chair 2x5 - notes bil knee pain when lowering down, added green TB around knees to limit genu valgus on 2nd set ?Supine knee rockers x15 ?Discussed continuing HEP and provided green TB to use with  sit to stands.  ?Neuromuscular re-ed: ?PPT 10 x 5 sec holds with tactile and verbal cues for form ?PPT with LE marching 2x12 ?Therapeutic Activity: ?Stepping over yellow hurdles and airex obstacle course in parallel bars with gait belt and CGA x2 ?Step on and off 6 inch step to mimic curbs in parallel bars with gait belt and CGA ? ? ? ?[facility] Adult PT Treatment:                                                DATE: 10/25/2021 ?Therapeutic Exercise: ?PPT 1 x 10 holding 5 seconds ?Supine marching 2 x 20 alternate L/R while maintaining posterior pelvic tilt with abdominal draw in maneuver ?Bil UE press on physioball 2 x 12 using and red physioball ?L hamstring stretch 2 x 30 sec in sitting ?Seated marching LLE 2 x 10 with BTB around knees ?SLR 2 x 10 with LLE in sitting ? ?Neuro Re-ed: ?Gait training with head turns 4 x 20 ft with SPC and gait belt with CGA for safety ? ?Therapeutic Activity: ?Going up/ down a 6 inch curb with proper form  ?Stepping over 1/2 bolster and turning around x 6 ? ?  ?PATIENT EDUCATION:               10/18/2021 ?Education details: reviewed POC dropping to 1 x every other week. Plan to focus on deficits and maximize HEP.  ?Person educated: Patient/ care taker ?Education method: Explanation ?Education comprehension: verbalized understanding ?  ?  ?HOME EXERCISE PROGRAM: ?Access Code: [email or web address] ?URL: [email or web address] ?Date: 09/26/2021 ?Prepared by: Starr Lake ? ?Exercises ?Lower Trunk Rotation - 1 x daily - 7 x weekly - 2 sets - 10 reps ?Hooklying Clamshell with Resistance - 1 x daily - 7 x weekly - 2 sets - 10 reps ?Seated March - 1 x daily - 7 x weekly - 2 sets - 10 reps ?Supine Bridge - 1 x daily - 7 x weekly - 2 sets - 10 reps ?Sit to Stand - 1 x daily - 7 x weekly - 2 sets - 8 reps ?Sidelying Hip Abduction - 1 x daily - 7 x weekly - 3 sets - 10 reps ?Seated Hip Adduction Isometrics with Ball - 1 x daily - 7 x weekly - 2 sets - 10 reps - 5 seconds hold ?Standing Hip  Abduction with Counter Support - 1 x daily - 7 x weekly - 2 sets - 12 reps ? ? ? ?  ?ASSESSMENT: ?  ?CLINICAL IMPRESSION:  ?Mrs Hor has made excellent progress with physical therapy, she does continue to report intermitten

## 2021-12-05 ENCOUNTER — Ambulatory Visit: Payer: Medicare Other | Admitting: Physical Therapy

## 2022-01-04 ENCOUNTER — Telehealth: Payer: Self-pay | Admitting: Interventional Cardiology

## 2022-01-04 NOTE — Telephone Encounter (Signed)
Spoke with patient, she states she is looking for a primary care provider and would like Dr. Thompson Caul recommendation on a new PCP. ? ?Will forward to Dr. Tamala Julian for review. ?

## 2022-01-04 NOTE — Telephone Encounter (Signed)
Patient called to speak with the nurse states is personal. Please advise  ?

## 2022-01-09 ENCOUNTER — Other Ambulatory Visit: Payer: Self-pay | Admitting: Family Medicine

## 2022-01-09 DIAGNOSIS — I2692 Saddle embolus of pulmonary artery without acute cor pulmonale: Secondary | ICD-10-CM

## 2022-01-09 NOTE — Telephone Encounter (Signed)
Spoke with patient to discuss Dr. Thompson Caul recommendations. ? ?Per Dr. Tamala Julian: ?PCP's are few in [place]. She should consider another [facility] office or [facility].  ? ?Patient thanked me for the call and expressed appreciation for the recommendation from Dr. Tamala Julian. ?

## 2022-01-17 ENCOUNTER — Other Ambulatory Visit: Payer: Self-pay

## 2022-01-17 NOTE — Patient Outreach (Signed)
[place] [facility]) Care Management ? ?01/17/2022 ? ?Jacqualine Mau ?25-May-1928 ?[id number] ? ? ?Telephone call to patient for disease management follow up.   No answer.  Unable to leave a message.     ? ?Plan: If no return call, RN CM will attempt patient again in the month of August. ? ?Jone Baseman, RN, MSN ?[facility] Care Management ?Care Management Coordinator ?Direct Line [phone] ?Toll Free: [phone]  ?Fax: [phone] ? ? ?

## 2022-02-02 ENCOUNTER — Other Ambulatory Visit: Payer: Self-pay | Admitting: *Deleted

## 2022-02-02 DIAGNOSIS — I10 Essential (primary) hypertension: Secondary | ICD-10-CM | POA: Diagnosis not present

## 2022-02-02 DIAGNOSIS — H04123 Dry eye syndrome of bilateral lacrimal glands: Secondary | ICD-10-CM | POA: Diagnosis not present

## 2022-02-02 DIAGNOSIS — H401132 Primary open-angle glaucoma, bilateral, moderate stage: Secondary | ICD-10-CM | POA: Diagnosis not present

## 2022-02-02 DIAGNOSIS — Z0000 Encounter for general adult medical examination without abnormal findings: Secondary | ICD-10-CM

## 2022-02-02 NOTE — Progress Notes (Signed)
Received letter from pt requesting advice from MD regarding establishing care with a new PCP.  Per MD pt needing referral to Dr. Pricilla Holm with [facility].  Referral placed. RN attempt x1 to contact pt, no answer.  LVM for pt to return call to the office.

## 2022-02-06 ENCOUNTER — Encounter: Payer: Self-pay | Admitting: *Deleted

## 2022-02-14 ENCOUNTER — Other Ambulatory Visit: Payer: Self-pay

## 2022-02-14 NOTE — Patient Outreach (Signed)
[place] [facility]) Care Management  02/14/2022  Shannon Jensen 1928-03-17 [id number]   Multiple attempts to establish contact with patient without success. No response from letter mailed to patient.   Plan: RN CM will close case at this time.    Jone Baseman, RN, MSN [facility] Care Management Care Management Coordinator Direct Line [phone] Toll Free: [phone]  Fax: [phone]

## 2022-03-01 ENCOUNTER — Ambulatory Visit (INDEPENDENT_AMBULATORY_CARE_PROVIDER_SITE_OTHER): Payer: Medicare Other | Admitting: Family Medicine

## 2022-03-01 ENCOUNTER — Encounter: Payer: Self-pay | Admitting: Family Medicine

## 2022-03-01 VITALS — BP 140/66 | HR 72 | Temp 98.6°F | Wt 151.0 lb

## 2022-03-01 DIAGNOSIS — Z86711 Personal history of pulmonary embolism: Secondary | ICD-10-CM | POA: Diagnosis not present

## 2022-03-01 NOTE — Progress Notes (Signed)
   Subjective:    Patient ID: Shannon Jensen, female    DOB: 02/04/1928, 88 y.o.   MRN: [id number]  HPI She is here for consult concerning pulmonary embolus.  She is concerned that her lungs have been damaged because of this and thinks she needs referral to pulmonary..  She has been on Xarelto since October.  She has had no difficulties with that.  Presently she is having no chest pain, shortness of breath, dyspnea.  She is not complaining of any leg pain or swelling.  She did see her oncologist and was taken off of tamoxifen.   Review of Systems     Objective:   Physical Exam Alert and in no distress otherwise not examined       Assessment & Plan:  History of pulmonary embolus (PE) She will stop her Xarelto since she has been on it for over 6 months.  Explained that the keep her on this medication long-term can then have problems with bleeding if she falls or hits her head.  Explained that the reason she probably had the pulmonary embolus is because of her underlying breast cancer and tamoxifen.  She seemed dissatisfied with this and I told her to definitely discuss this further with her oncologist.  Explained that there is really no reason to send her to a pulmonologist.

## 2022-03-20 ENCOUNTER — Inpatient Hospital Stay: Payer: Medicare Other | Attending: Hematology and Oncology | Admitting: Hematology and Oncology

## 2022-03-20 ENCOUNTER — Other Ambulatory Visit: Payer: Self-pay

## 2022-03-20 VITALS — BP 169/75 | HR 84 | Temp 98.2°F | Resp 18 | Ht 62.0 in | Wt 151.0 lb

## 2022-03-20 DIAGNOSIS — M79662 Pain in left lower leg: Secondary | ICD-10-CM | POA: Diagnosis not present

## 2022-03-20 DIAGNOSIS — Z79899 Other long term (current) drug therapy: Secondary | ICD-10-CM | POA: Diagnosis not present

## 2022-03-20 DIAGNOSIS — Z923 Personal history of irradiation: Secondary | ICD-10-CM | POA: Insufficient documentation

## 2022-03-20 DIAGNOSIS — Z853 Personal history of malignant neoplasm of breast: Secondary | ICD-10-CM | POA: Diagnosis not present

## 2022-03-20 DIAGNOSIS — Z170 Estrogen receptor positive status [ER+]: Secondary | ICD-10-CM | POA: Insufficient documentation

## 2022-03-20 DIAGNOSIS — C50212 Malignant neoplasm of upper-inner quadrant of left female breast: Secondary | ICD-10-CM | POA: Insufficient documentation

## 2022-03-20 DIAGNOSIS — R0602 Shortness of breath: Secondary | ICD-10-CM | POA: Diagnosis not present

## 2022-03-20 DIAGNOSIS — M79621 Pain in right upper arm: Secondary | ICD-10-CM | POA: Insufficient documentation

## 2022-03-20 NOTE — Assessment & Plan Note (Addendum)
09/18/2017:Left breast biopsy 11 o'clock position: IDC grade 23/08/2018:Left lumpectomy: IDC grade 1, 0.6 cm, low-grade DCIS, margins negative, T1 be N0 stage I a, ER 100%, PR 70%, HER-2 negative, Ki-67 2%  (History of right breast cancer Right breast invasive ductal carcinoma T1b N0 M0 stage IA diagnosis in 2009 status post lumpectomy 05/24/2008 ER positive PR positive HER-2 negative status post adjuvant radiation and took 5 years of antiestrogen therapy with tamoxifen completed 10/08/2013 Patient was offered extended adjuvant therapy but she declined)  Current treatment: Tamoxifen 10 mg daily(patient refusedradiation)  Recent blood clots October 2022: Tamoxifen has been discontinued since then  Breast cancer surveillance: 1.Breast exam7/07/2022: Benign, palpable nodularity at the site of surgery 2.Mammogram 09/07/2021: Benignbreast density category B    Return to clinicin48yror follow-up

## 2022-03-20 NOTE — Progress Notes (Signed)
Patient Care Team: [name], MD as PCP - General (Family Medicine)  DIAGNOSIS:  Encounter Diagnosis  Name Primary?   Malignant neoplasm of upper-inner quadrant of left breast in female, estrogen receptor positive ([place]) Yes    SUMMARY OF ONCOLOGIC HISTORY: Oncology History  History of right breast cancer (Resolved)  05/24/2008 Surgery    Right breast lumpectomy revealing 0.9 cm grade 1 invasive ductal carcinoma with negative margins, ER positive, PR positive, HER-2 negative   06/29/2008 - 08/06/2008 Radiation Therapy    adjuvant radiation therapy   10/11/2008 - 10/08/2013 Anti-estrogen oral therapy    antiestrogen therapy with Femara then switched to tamoxifen due to osteoporosis completed 5 years of tamoxifen , patient declined extended adjuvant therapy.   Malignant neoplasm of upper-inner quadrant of left breast in female, estrogen receptor positive ([place])  09/18/2017 Initial Diagnosis   Left breast biopsy 11 o'clock position: IDC grade 2, 0.4 cm by mammogram.  The biopsy had 0.5 cm of material.  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  The Hereditary Gene Panel offered by [facility] includes sequencing and/or deletion duplication testing of the following 47 genes: APC, ATM, AXIN2, BARD1, BMPR1A, BRCA1, BRCA2, BRIP1, CDH1, CDK4, CDKN2A (p14ARF), CDKN2A (p16INK4a), CHEK2, CTNNA1, DICER1, EPCAM (Deletion/duplication testing only), GREM1 (promoter region deletion/duplication testing only), KIT, MEN1, MLH1, MSH2, MSH3, MSH6, MUTYH, NBN, NF1, NHTL1, PALB2, PDGFRA, PMS2, POLD1, POLE, PTEN, RAD50, RAD51C, RAD51D, SDHB, SDHC, SDHD, SMAD4, SMARCA4. STK11, TP53,  TSC1, TSC2, and VHL.  The following genes were evaluated for sequence changes only: SDHA and HOXB13 c.251G>A variant only. The report date is October 02, 2018.  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  The amended report date is March 20, 2021.     CHIEF COMPLIANT:  Follow-up of left breast cancer      INTERVAL HISTORY: Shannon Jensen is a 88 y.o. with above-mentioned history of left breast cancer who underwent a lumpectomy and took tamoxifen until it was discontinued when she was diagnosed with pulmonary embolism. She presents to the clinic today for follow-up. States that she concern about a blood clot. Complains of shortening of breath. She is having headaches that goes and comes.  She wants to make sure that there is no residual blood clots.  She completed 6 months of anticoagulation and discontinued Xarelto.   ALLERGIES:  is allergic to celecoxib, erythromycin, nabumetone, nsaids, oxycodone-acetaminophen, tyloxapol, and vioxx [rofecoxib].  MEDICATIONS:  Current Outpatient Medications  Medication Sig Dispense Refill   acetaminophen (TYLENOL) 325 MG tablet Take 650 mg by mouth every 6 (six) hours as needed for moderate pain or headache.      amLODipine (NORVASC) 5 MG tablet Take 1 tablet (5 mg total) by mouth daily. 90 tablet 3   Cholecalciferol (VITAMIN D) 2000 units CAPS Take 2,000 Units by mouth daily.     metoprolol succinate (TOPROL XL) 25 MG 24 hr tablet Take 1 tablet (25 mg total) by mouth daily. 90 tablet 3   Multiple Vitamins-Minerals (WOMENS ONE DAILY) TABS Take 1 tablet by mouth daily.       Omega-3 Fatty  Acids (FISH OIL) 1200 MG CAPS Take 1,200 mg by mouth daily.      No current facility-administered medications for this visit.    PHYSICAL EXAMINATION: ECOG PERFORMANCE STATUS: 1 - Symptomatic but completely ambulatory  Vitals:   03/20/22 1504  BP: (!) 169/75  Pulse: 84  Resp: 18  Temp: 98.2 F (36.8 C)  SpO2: 100%   Filed  Weights   03/20/22 1504  Weight: 151 lb (68.5 kg)    BREAST: No palpable masses or nodules in either right or left breasts. No palpable axillary supraclavicular or infraclavicular adenopathy no breast tenderness or nipple discharge. (exam performed in the presence of a chaperone)  LABORATORY DATA:  I have reviewed the data as listed    Latest Ref Rng & Units 06/15/2021    2:46 AM 06/14/2021    3:37 AM 06/12/2021   10:04 PM  CMP  Glucose 70 - 99 mg/dL 101  97  102   BUN 8 - 23 mg/dL _0 Creatinine 0.44 - 1.00 mg/dL 1.00  1.25  1.31   Sodium 135 - 145 mmol/L 131  132  134   Potassium 3.5 - 5.1 mmol/L 4.4  4.5  4.2   Chloride 98 - 111 mmol/L 102  104  102   CO2 22 - 32 mmol/L _1 Calcium 8.9 - 10.3 mg/dL 8.6  8.8  9.2   Total Protein 6.5 - 8.1 g/dL  5.9    Total Bilirubin 0.3 - 1.2 mg/dL  0.7    Alkaline Phos 38 - 126 U/L  33    AST 15 - 41 U/L  22    ALT 0 - 44 U/L  16      Lab Results  Component Value Date   WBC 7.0 06/16/2021   HGB 10.3 (L) 06/16/2021   HCT 30.6 (L) 06/16/2021   MCV 90.8 06/16/2021   PLT 171 06/16/2021   NEUTROABS 5.5 06/12/2021    ASSESSMENT & PLAN:  Malignant neoplasm of upper-inner quadrant of left breast in female, estrogen receptor positive ([place]) 09/18/2017: Left breast biopsy 11 o'clock position: IDC grade 2 11/19/2017:Left lumpectomy: IDC grade 1, 0.6 cm, low-grade DCIS, margins negative, T1 be N0 stage I a, ER 100%, PR 70%, HER-2 negative, Ki-67 2%   (History of right breast cancer Right breast invasive ductal carcinoma T1b N0 M0 stage IA diagnosis in 2009 status post lumpectomy 05/24/2008 ER positive PR positive HER-2 negative status post adjuvant radiation and took 5 years of antiestrogen therapy with tamoxifen completed 10/08/2013 Patient was offered extended adjuvant therapy but she declined)    Current treatment: Tamoxifen 10 mg daily (patient refused radiation)  Recent blood clots October 2022: Tamoxifen has been discontinued  since then    Breast cancer surveillance: 1.  Breast exam 03/20/2022: Benign, palpable nodularity at the site of surgery 2.  Mammogram 09/07/2021: Benign breast density category B   Shortness of breath: Will obtain CT chest for PE protocol stat Bilateral lower extremity pain: We will obtain a bilateral lower extremity ultrasound for ruling out blood clots.  Return to clinic in 1 yr for follow-up    Orders Placed This Encounter  Procedures   CT Angio Chest Pulmonary Embolism (PE) W or WO Contrast    Standing Status:   Future    Standing Expiration Date:   03/21/2023    Order Specific Question:   If indicated for the ordered procedure, I authorize the administration  of contrast media per Radiology protocol    Answer:   Yes    Order Specific Question:   Preferred imaging location?    Answer:   [facility]    Order Specific Question:   Radiology Contrast Protocol - do NOT remove file path    Answer:   [email or web address]   The patient has a good understanding of the overall plan. she agrees with it. she will call with any problems that may develop before the next visit here. Total time spent: 30 mins including face to face time and time spent for planning, charting and co-ordination of care   Harriette Ohara, MD 03/20/22    I Gardiner Coins am scribing for Dr. Lindi Adie  I have reviewed the above documentation for accuracy and completeness, and I agree with the above.

## 2022-03-21 ENCOUNTER — Ambulatory Visit (HOSPITAL_COMMUNITY)
Admission: RE | Admit: 2022-03-21 | Discharge: 2022-03-21 | Disposition: A | Discharge status: Home / Self Care | Payer: Medicare Other | Source: Ambulatory Visit | Attending: Hematology and Oncology | Admitting: Hematology and Oncology

## 2022-03-21 ENCOUNTER — Encounter (HOSPITAL_COMMUNITY): Payer: Self-pay

## 2022-03-21 DIAGNOSIS — R918 Other nonspecific abnormal finding of lung field: Secondary | ICD-10-CM | POA: Diagnosis not present

## 2022-03-21 DIAGNOSIS — Z170 Estrogen receptor positive status [ER+]: Secondary | ICD-10-CM | POA: Diagnosis not present

## 2022-03-21 DIAGNOSIS — C50212 Malignant neoplasm of upper-inner quadrant of left female breast: Secondary | ICD-10-CM

## 2022-03-21 DIAGNOSIS — R0789 Other chest pain: Secondary | ICD-10-CM | POA: Diagnosis not present

## 2022-03-21 DIAGNOSIS — M47814 Spondylosis without myelopathy or radiculopathy, thoracic region: Secondary | ICD-10-CM | POA: Diagnosis not present

## 2022-03-21 DIAGNOSIS — J8489 Other specified interstitial pulmonary diseases: Secondary | ICD-10-CM | POA: Diagnosis not present

## 2022-03-21 MED ORDER — SODIUM CHLORIDE (PF) 0.9 % IJ SOLN
INTRAMUSCULAR | Status: AC
  Filled 2022-03-21: qty 50

## 2022-03-21 MED ORDER — IOHEXOL 350 MG/ML SOLN
75.0000 mL | Freq: Once | INTRAVENOUS | Status: AC | PRN
  Administered 2022-03-21: 75 mL via INTRAVENOUS

## 2022-03-23 ENCOUNTER — Inpatient Hospital Stay: Payer: Medicare Other | Admitting: Hematology and Oncology

## 2022-04-12 ENCOUNTER — Ambulatory Visit: Payer: Self-pay

## 2022-05-16 ENCOUNTER — Encounter: Payer: Self-pay | Admitting: Internal Medicine

## 2022-05-24 DIAGNOSIS — M170 Bilateral primary osteoarthritis of knee: Secondary | ICD-10-CM | POA: Diagnosis not present

## 2022-05-24 DIAGNOSIS — M545 Low back pain, unspecified: Secondary | ICD-10-CM | POA: Diagnosis not present

## 2022-06-19 ENCOUNTER — Encounter: Payer: Self-pay | Admitting: Internal Medicine

## 2022-07-02 ENCOUNTER — Encounter: Payer: Self-pay | Admitting: Internal Medicine

## 2022-07-03 DIAGNOSIS — Z170 Estrogen receptor positive status [ER+]: Secondary | ICD-10-CM | POA: Diagnosis not present

## 2022-07-03 DIAGNOSIS — C50212 Malignant neoplasm of upper-inner quadrant of left female breast: Secondary | ICD-10-CM | POA: Diagnosis not present

## 2022-07-12 ENCOUNTER — Telehealth: Payer: Self-pay | Admitting: Family Medicine

## 2022-07-12 NOTE — Telephone Encounter (Signed)
Left message for patient to call back and schedule Medicare Annual Wellness Visit (AWV) either virtually or in office. Left  my Shannon Jensen number [phone]   Last AWV 07/20/21 please schedule with Nurse Health Adviser   45 min for awv-i and in office appointments 30 min for awv-s  phone/virtual appointments

## 2022-07-24 ENCOUNTER — Telehealth: Payer: Self-pay | Admitting: Internal Medicine

## 2022-07-24 ENCOUNTER — Ambulatory Visit (INDEPENDENT_AMBULATORY_CARE_PROVIDER_SITE_OTHER): Payer: Medicare Other | Admitting: Internal Medicine

## 2022-07-24 ENCOUNTER — Encounter: Payer: Self-pay | Admitting: Internal Medicine

## 2022-07-24 VITALS — BP 160/60 | HR 87 | Temp 98.4°F | Ht 62.0 in | Wt 148.0 lb

## 2022-07-24 DIAGNOSIS — Z0001 Encounter for general adult medical examination with abnormal findings: Secondary | ICD-10-CM

## 2022-07-24 DIAGNOSIS — H9190 Unspecified hearing loss, unspecified ear: Secondary | ICD-10-CM | POA: Diagnosis not present

## 2022-07-24 DIAGNOSIS — Z86711 Personal history of pulmonary embolism: Secondary | ICD-10-CM | POA: Diagnosis not present

## 2022-07-24 DIAGNOSIS — Z23 Encounter for immunization: Secondary | ICD-10-CM | POA: Diagnosis not present

## 2022-07-24 DIAGNOSIS — E049 Nontoxic goiter, unspecified: Secondary | ICD-10-CM

## 2022-07-24 DIAGNOSIS — I10 Essential (primary) hypertension: Secondary | ICD-10-CM | POA: Diagnosis not present

## 2022-07-24 DIAGNOSIS — Z8639 Personal history of other endocrine, nutritional and metabolic disease: Secondary | ICD-10-CM | POA: Diagnosis not present

## 2022-07-24 DIAGNOSIS — E785 Hyperlipidemia, unspecified: Secondary | ICD-10-CM

## 2022-07-24 DIAGNOSIS — K219 Gastro-esophageal reflux disease without esophagitis: Secondary | ICD-10-CM

## 2022-07-24 DIAGNOSIS — Z853 Personal history of malignant neoplasm of breast: Secondary | ICD-10-CM

## 2022-07-24 LAB — LIPID PANEL
Cholesterol: 204 mg/dL — ABNORMAL HIGH (ref 0–200)
HDL: 47.9 mg/dL (ref >39.00)
LDL Cholesterol: 139 mg/dL — ABNORMAL HIGH (ref 0–99)
NonHDL: 155.84
Total CHOL/HDL Ratio: 4
Triglycerides: 86 mg/dL (ref 0.0–149.0)
VLDL: 17.2 mg/dL (ref 0.0–40.0)

## 2022-07-24 LAB — COMPREHENSIVE METABOLIC PANEL
ALT: 14 U/L (ref 0–35)
AST: 21 U/L (ref 0–37)
Albumin: 3.9 g/dL (ref 3.5–5.2)
Alkaline Phosphatase: 63 U/L (ref 39–117)
BUN: 16 mg/dL (ref 6–23)
CO2: 28 mEq/L (ref 19–32)
Calcium: 9.6 mg/dL (ref 8.4–10.5)
Chloride: 104 mEq/L (ref 96–112)
Creatinine, Ser: 0.81 mg/dL (ref 0.40–1.20)
GFR: 63.24 mL/min (ref >60.00)
Glucose, Bld: 100 mg/dL — ABNORMAL HIGH (ref 70–99)
Potassium: 3.6 mEq/L (ref 3.5–5.1)
Sodium: 138 mEq/L (ref 135–145)
Total Bilirubin: 0.7 mg/dL (ref 0.2–1.2)
Total Protein: 6.9 g/dL (ref 6.0–8.3)

## 2022-07-24 LAB — URINALYSIS, ROUTINE W REFLEX MICROSCOPIC
Bilirubin Urine: NEGATIVE
Hgb urine dipstick: NEGATIVE
Ketones, ur: NEGATIVE
Leukocytes,Ua: NEGATIVE
Nitrite: NEGATIVE
RBC / HPF: NONE SEEN (ref 0–?)
Specific Gravity, Urine: 1.015 (ref 1.000–1.030)
Total Protein, Urine: NEGATIVE
Urine Glucose: NEGATIVE
Urobilinogen, UA: 0.2 (ref 0.0–1.0)
pH: 7 (ref 5.0–8.0)

## 2022-07-24 LAB — CBC
HCT: 38.8 % (ref 36.0–46.0)
Hemoglobin: 12.8 g/dL (ref 12.0–15.0)
MCHC: 33.1 g/dL (ref 30.0–36.0)
MCV: 94.1 fl (ref 78.0–100.0)
Platelets: 222 10*3/uL (ref 150.0–400.0)
RBC: 4.12 Mil/uL (ref 3.87–5.11)
RDW: 15 % (ref 11.5–15.5)
WBC: 7 10*3/uL (ref 4.0–10.5)

## 2022-07-24 LAB — TSH: TSH: 0.9 u[IU]/mL (ref 0.35–5.50)

## 2022-07-24 LAB — VITAMIN D 25 HYDROXY (VIT D DEFICIENCY, FRACTURES): VITD: 81.3 ng/mL (ref 30.00–100.00)

## 2022-07-24 MED ORDER — PANTOPRAZOLE SODIUM 40 MG PO TBEC: 40.0000 mg | DELAYED_RELEASE_TABLET | Freq: Every day | ORAL | 3 refills | Status: DC

## 2022-07-24 NOTE — Telephone Encounter (Signed)
Patient says provider sent RX to old pharmacy. Please send to [facility].  PANTOPRAZOLE 40 MG tablet  [facility] [address], [place]

## 2022-07-24 NOTE — Progress Notes (Signed)
Subjective:   Patient ID: Shannon Jensen, female    DOB: 10-26-1927, 88 y.o.   MRN: [id number]  HPI Here for new patient visit as well as physical and medicare wellness,  with complaints. Please see A/P for status and treatment of chronic medical problems.   Diet: heart healthy Physical activity: sedentary, active Depression/mood screen: negative Hearing: intact to whispered voice, mild to moderate loss bilaterally wants updated exam Visual acuity: grossly normal, s/p cataract surgery bilateral performs annual eye exam  ADLs: capable Fall risk: low Home safety: good Cognitive evaluation: intact to orientation, naming, recall and repetition EOL planning: adv directives discussed  [place] Visit from 07/20/2021 in [place]  PHQ-2 Total Score 0           06/29/2021    6:31 PM 07/20/2021    1:46 PM 08/09/2021   11:51 AM 03/20/2022    3:00 PM 07/24/2022    1:19 PM  [place] in the past year? 0 0 0  0  Was there an injury with Fall? 0 0   0  Fall Risk Category Calculator 0 0   0  Fall Risk Category Low Low   Low  Patient Fall Risk Level Moderate fall risk Low fall risk  High fall risk Low fall risk  Patient at Risk for Falls Due to Impaired balance/gait;Impaired mobility;Mental status change No Fall Risks     Fall risk Follow up Falls evaluation completed;Education provided;Falls prevention discussed Falls evaluation completed   Falls evaluation completed    I have personally reviewed and have noted 1. The patient's medical and social history - reviewed today no changes 2. Their use of alcohol, tobacco or illicit drugs 3. Their current medications and supplements 4. The patient's functional ability including ADL's, fall risks, home safety risks and hearing or visual impairment. 5. Diet and physical activities 6. Evidence for depression or mood disorders 7. Care team reviewed and updated 8.  The patient is not on an opioid pain  medication.  Patient Care Team: [name], MD as PCP - General (Family Medicine) Past Medical History:  Diagnosis Date   Anxiety and depression    Arthritis    Breast cancer [facility]) 08/2008   Right Breast Cancer   Breast cancer ([place]) 2019   Left Breast Cancer   Chronic headaches    Cystocele    Diverticulosis    Family history of breast cancer    Family history of colon cancer    Family history of pancreatic cancer    Family history of prostate cancer    GERD (gastroesophageal reflux disease)    H/O vitamin D deficiency    HLD (hyperlipidemia)    Hypertension    Osteoarthritis    Osteoporosis    Pelvic relaxation    Personal history of radiation therapy 2009   Right Breast Cancer   Pulmonary embolism ([place]) 06/2021   Urinary tract infection 2014   Past Surgical History:  Procedure Laterality Date   [place]   BREAST LUMPECTOMY Right 2009   BREAST LUMPECTOMY Left 2019   BREAST LUMPECTOMY WITH RADIOACTIVE SEED LOCALIZATION Left 11/19/2017   Procedure: BREAST LUMPECTOMY WITH RADIOACTIVE SEED LOCALIZATION;  Surgeon: Rolm Bookbinder, MD;  Location: [facility];  Service: General;  Laterality: Left;   BREAST SURGERY  2009   cancer-lymph node removal   NECK SURGERY     cyst removal   Family History  Problem  Relation Age of Onset   Prostate cancer Father 70       d. 74   Heart disease Paternal Uncle    Colon cancer Mother        dx 96s-60s   Breast cancer Sister        dx >50   Pancreatic cancer Brother    Prostate cancer Brother    Colon polyps Daughter        x 2, son   Stroke Daughter        x 2   Appendicitis Daughter        appendectomy   Pancreatic cancer Sister 65       d. 93   Prostate cancer Nephew        dx.50s   Review of Systems  Constitutional:  Positive for activity change and fatigue.  HENT:  Positive for hearing loss.   Eyes: Negative.   Respiratory:  Positive for shortness of breath. Negative for  cough and chest tightness.   Cardiovascular:  Negative for chest pain, palpitations and leg swelling.  Gastrointestinal:  Negative for abdominal distention, abdominal pain, constipation, diarrhea, nausea and vomiting.  Musculoskeletal:  Positive for arthralgias.  Skin: Negative.   Neurological: Negative.   Psychiatric/Behavioral: Negative.      Objective:  Physical Exam Constitutional:      Appearance: She is well-developed.  HENT:     Head: Normocephalic and atraumatic.  Cardiovascular:     Rate and Rhythm: Normal rate and regular rhythm.  Pulmonary:     Effort: Pulmonary effort is normal. No respiratory distress.     Breath sounds: Normal breath sounds. No wheezing or rales.  Abdominal:     General: Bowel sounds are normal. There is no distension.     Palpations: Abdomen is soft.     Tenderness: There is no abdominal tenderness. There is no rebound.  Musculoskeletal:     Cervical back: Normal range of motion.  Skin:    General: Skin is warm and dry.  Neurological:     Mental Status: She is alert and oriented to person, place, and time.     Coordination: Coordination normal.     Vitals:   07/24/22 1309 07/24/22 1314  BP: (!) 160/80 (!) 160/60  Pulse: 87   Temp: 98.4 F (36.9 C)   TempSrc: Oral   SpO2: 97%   Weight: 148 lb (67.1 kg)   Height: '5\' 2"'$  (1.575 m)     Assessment & Plan:  Flu shot given at visit

## 2022-07-24 NOTE — Patient Instructions (Addendum)
Skeet Latch is a really good cardiologist.   We have sent in protonix to take 1 pill daily before breakfast to help the heartburn.  Dr. Linda Hedges or Dr. Garwin Brothers are both good gyn doctors.  Try metamucil or benefiber to help with the bowels.  For BP cuff [facility]): [address] [place], [place] [address] [phone]  They offer free delivery to anywhere in [place]

## 2022-07-26 ENCOUNTER — Other Ambulatory Visit: Payer: Self-pay | Admitting: Interventional Cardiology

## 2022-07-26 ENCOUNTER — Telehealth: Payer: Self-pay

## 2022-07-26 NOTE — Telephone Encounter (Signed)
Spoke with Shannon Jensen over the phone to give her the referral numbers for the ob gyn and audiologist .

## 2022-07-27 DIAGNOSIS — Z0001 Encounter for general adult medical examination with abnormal findings: Secondary | ICD-10-CM | POA: Insufficient documentation

## 2022-07-27 DIAGNOSIS — H9190 Unspecified hearing loss, unspecified ear: Secondary | ICD-10-CM | POA: Insufficient documentation

## 2022-07-27 NOTE — Assessment & Plan Note (Signed)
Progressive loss and son wants her to have repeat audiology evaluation. This is ordered today.

## 2022-07-27 NOTE — Assessment & Plan Note (Signed)
Checking CMP and CBC and adjust as needed. Home BP readings are at goal. She is concerned that she used to take 1 combination pill which she does not understand why she is taking 2 medications currently. During chart review discovered she had a run of SVT during a hospital/er visit and during admission her BP medication was changed from amlodipine/ARB to amlodipine and beta blocker. This seems appropriate so we will keep amlodipine 5 mg daily and metoprolol 25 mg daily. We do have room to titrate if needed.

## 2022-07-27 NOTE — Assessment & Plan Note (Signed)
She is taking omega-3FA otc and checking lipid panel today to see if she is at goal. Adjust as needed.

## 2022-07-27 NOTE — Assessment & Plan Note (Signed)
Checking TSH and adjust as needed. Not on medication currently and some fatigue but no clear symptoms of high/low thyroid levels.

## 2022-07-27 NOTE — Assessment & Plan Note (Signed)
She does have SOB on exertion. She was treated for 6 months for this PE and this was triggered by her tamoxifen. This was discontinued. She had CT angio July 2023 reviewed with patient and her son this showed resolution of the clot. She has no change in symptoms to suggest recurrence.

## 2022-07-27 NOTE — Assessment & Plan Note (Signed)
Flu shot given today. Covid-19 counseled. Pneumonia complete. Shingrix had 1st does not want 2nd. Tetanus due 2031. Colonoscopy aged out. Mammogram gets yearly due to breast cancer within 5 years but could stop if desired, pap smear aged out and dexa complete. Counseled about sun safety and mole surveillance. Counseled about the dangers of distracted driving. Given 10 year screening recommendations.

## 2022-07-27 NOTE — Assessment & Plan Note (Addendum)
Overall not controlled. Rx protonix 40 mg daily to try for symptoms as they could be contributing to her SOB and some chronic cough.

## 2022-07-27 NOTE — Assessment & Plan Note (Signed)
Checking vitamin D level today. Adjust as needed taking 2000 IU daily otc.

## 2022-07-27 NOTE — Assessment & Plan Note (Signed)
Given age she could stop mammogram screening and consider only re-screen for any symptoms. She will think about this but wishes to continue for now. She is not on any treatment.

## 2022-08-09 ENCOUNTER — Ambulatory Visit: Payer: Medicare Other | Admitting: Audiologist

## 2022-08-09 ENCOUNTER — Ambulatory Visit: Payer: Medicare Other | Attending: Audiologist | Admitting: Audiologist

## 2022-08-09 DIAGNOSIS — H903 Sensorineural hearing loss, bilateral: Secondary | ICD-10-CM | POA: Diagnosis not present

## 2022-08-10 NOTE — Procedures (Signed)
  Outpatient Audiology and [place], [place]  [id number] [phone]  AUDIOLOGICAL  EVALUATION  NAME: [name]     DOB:   [birth year]      MRN: [id number]                                                                                     DATE: 08/10/2022     REFERENT: Hoyt Koch, MD STATUS: Outpatient DIAGNOSIS: Sensorineural Hearing Loss Bilateral     History: [name], 88 y.o., was seen for an audiological evaluation. [name] was accompanied to the appointment by her son.  [name] is receiving a hearing evaluation due to concerns for hearing loss, she cannot hear people clearly. [name] has difficulty hearing in both ears and it is worse in the her right for many years. This difficulty began gradually. No pain or pressure reported in either ear. Tinnitus denied in both ears. [name] has previously been followed by Otolaryngology and been seen for hearing tests. It has been a year since her last hearing test. She has never tried hearing aids.  No other relevant case history reported.   Evaluation:  Otoscopy showed a clear view of the tympanic membranes, bilaterally Tympanometry results were consistent with normal middle ear function, bilaterally   Audiometric testing was completed using conventional audiometry with insert transducer. Speech Recognition Thresholds were 70dB in the right ear and 50dB in the left ear. Word Recognition was performed at 5dB below UCL for speech, 90dB, scored 68% in the right ear and 92% in the left ear. Pure tone thresholds show moderately severe sensorineural hearing loss in the right ear and mild sloping to severe sensorineural hearing loss in the left ear.   Results:  The test results were reviewed with [name] and her son. [name] has sensorineural hearing loss in each ear with the right ear slightly worse. Recommend hearing aids for both ears. [name] was given three handouts with  local providers for hearing aids, how to use her insurance benefits, and a copy of her audiogram. Hearing aids were discussed at length. [name] has already been followed by Otolaryngology. [name] also would like to be referred for a caption call phone.     Recommendations: Amplification is necessary for both ears. Hearing aids can be purchased from a variety of locations. See provided list for locations in the [place] area. See handouts on how to use [employer] or [employer].  Referral to [employer] for amplified phone made. They will call [name] to set up the phone and teach her to use it.     85 minutes spent testing and counseling on results.   [name]  Audiologist, Au.D., CCC-A 08/10/2022  8:44 AM  Cc: Hoyt Koch, MD

## 2022-08-22 ENCOUNTER — Other Ambulatory Visit: Payer: Self-pay | Admitting: Hematology and Oncology

## 2022-08-22 DIAGNOSIS — Z1231 Encounter for screening mammogram for malignant neoplasm of breast: Secondary | ICD-10-CM

## 2022-08-29 DIAGNOSIS — M1712 Unilateral primary osteoarthritis, left knee: Secondary | ICD-10-CM | POA: Diagnosis not present

## 2022-08-29 DIAGNOSIS — M25562 Pain in left knee: Secondary | ICD-10-CM | POA: Diagnosis not present

## 2022-08-29 DIAGNOSIS — M25561 Pain in right knee: Secondary | ICD-10-CM | POA: Diagnosis not present

## 2022-08-29 DIAGNOSIS — M170 Bilateral primary osteoarthritis of knee: Secondary | ICD-10-CM | POA: Diagnosis not present

## 2022-08-30 NOTE — Progress Notes (Signed)
Patient Care Team: Hoyt Koch, MD as PCP - General (Internal Medicine)  DIAGNOSIS:  Encounter Diagnosis  Name Primary?   HX: breast cancer Yes    SUMMARY OF ONCOLOGIC HISTORY: Oncology History  History of right breast cancer (Resolved)  05/24/2008 Surgery    Right breast lumpectomy revealing 0.9 cm grade 1 invasive ductal carcinoma with negative margins, ER positive, PR positive, HER-2 negative   06/29/2008 - 08/06/2008 Radiation Therapy    adjuvant radiation therapy   10/11/2008 - 10/08/2013 Anti-estrogen oral therapy    antiestrogen therapy with Femara then switched to tamoxifen due to osteoporosis completed 5 years of tamoxifen , patient declined extended adjuvant therapy.   HX: breast cancer  09/18/2017 Initial Diagnosis   Left breast biopsy 11 o'clock position: IDC grade 2, 0.4 cm by mammogram.  The biopsy had 0.5 cm of material.  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  The Hereditary Gene Panel offered by [facility] includes sequencing and/or deletion duplication testing of the following 47 genes: APC, ATM, AXIN2, BARD1, BMPR1A, BRCA1, BRCA2, BRIP1, CDH1, CDK4, CDKN2A (p14ARF), CDKN2A (p16INK4a), CHEK2, CTNNA1, DICER1, EPCAM (Deletion/duplication testing only), GREM1 (promoter region deletion/duplication testing only), KIT, MEN1, MLH1, MSH2, MSH3, MSH6, MUTYH, NBN, NF1, NHTL1, PALB2, PDGFRA, PMS2, POLD1, POLE, PTEN, RAD50, RAD51C, RAD51D, SDHB, SDHC, SDHD, SMAD4, SMARCA4. STK11, TP53, TSC1, TSC2, and VHL.  The following genes were evaluated for sequence changes only: SDHA and HOXB13 c.251G>A variant only. The report date is October 02, 2018.  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  The amended report date is March 20, 2021.     CHIEF COMPLIANT:  Follow-up of left breast cancer     INTERVAL HISTORY: Shannon Jensen is a 88 y.o. with above-mentioned history of left breast cancer who underwent a lumpectomy and took tamoxifen until it was discontinued when she was diagnosed with pulmonary embolism. She presents to the clinic today for follow-up. She reports that her legs is up and down.   ALLERGIES:  is allergic to celecoxib, erythromycin, nabumetone, nsaids, oxycodone-acetaminophen, tyloxapol, and vioxx [rofecoxib].  MEDICATIONS:  Current Outpatient Medications  Medication Sig Dispense Refill   Blood Pressure Monitoring (OMRON 5 SERIES BP MONITOR) DEVI 1 Units by Does not apply route daily. 1 each 0   acetaminophen (TYLENOL) 325 MG tablet Take 650 mg by mouth every 6 (six) hours as needed for moderate pain or headache.      amLODipine (NORVASC) 5 MG tablet TAKE 1 TABLET (5 MG TOTAL) BY MOUTH DAILY. 30 tablet 3   Cholecalciferol (VITAMIN D) 2000 units CAPS Take 2,000 Units by mouth daily.     metoprolol succinate (TOPROL XL) 25 MG 24 hr tablet Take 1 tablet (25 mg total) by mouth daily. 90 tablet 3   Multiple Vitamins-Minerals (WOMENS ONE DAILY) TABS Take 1 tablet by mouth daily.       Omega-3 Fatty Acids (FISH OIL) 1200 MG CAPS Take 1,200 mg by mouth daily.      No current facility-administered medications for this visit.    PHYSICAL EXAMINATION: ECOG PERFORMANCE STATUS: 1 - Symptomatic but  completely ambulatory  Vitals:   08/31/22 1110  BP: (!) 155/76  Pulse: 94  Resp: 14  Temp: 97.9 F (36.6 C)  SpO2: 100%   Filed Weights   08/31/22 1110  Weight: 145 lb 3.2 oz (65.9 kg)    BREAST: Right breast palpable nodule related to scar tissue. No palpable axillary supraclavicular or infraclavicular adenopathy no breast tenderness or nipple discharge. (exam performed in the  presence of a chaperone)  LABORATORY DATA:  I have reviewed the data as listed    Latest Ref Rng & Units 07/24/2022    2:49 PM 06/15/2021    2:46 AM 06/14/2021    3:37 AM  CMP  Glucose 70 - 99 mg/dL 100  101  97   BUN 6 - 23 mg/dL _0 Creatinine 0.40 - 1.20 mg/dL 0.81  1.00  1.25   Sodium 135 - 145 mEq/L 138  131  132   Potassium 3.5 - 5.1 mEq/L 3.6  4.4  4.5   Chloride 96 - 112 mEq/L 104  102  104   CO2 19 - 32 mEq/L _1 Calcium 8.4 - 10.5 mg/dL 9.6  8.6  8.8   Total Protein 6.0 - 8.3 g/dL 6.9   5.9   Total Bilirubin 0.2 - 1.2 mg/dL 0.7   0.7   Alkaline Phos 39 - 117 U/L 63   33   AST 0 - 37 U/L 21   22   ALT 0 - 35 U/L 14   16     Lab Results  Component Value Date   WBC 7.0 07/24/2022   HGB 12.8 07/24/2022   HCT 38.8 07/24/2022   MCV 94.1 07/24/2022   PLT 222.0 07/24/2022   NEUTROABS 5.5 06/12/2021    ASSESSMENT & PLAN:  HX: breast cancer 09/18/2017: Left breast biopsy 11 o'clock position: IDC grade 2 11/19/2017:Left lumpectomy: IDC grade 1, 0.6 cm, low-grade DCIS, margins negative, T1 be N0 stage I a, ER 100%, PR 70%, HER-2 negative, Ki-67 2%   (History of right breast cancer Right breast invasive ductal carcinoma T1b N0 M0 stage IA diagnosis in 2009 status post lumpectomy 05/24/2008 ER positive PR positive HER-2 negative status post adjuvant radiation and took 5 years of antiestrogen therapy with tamoxifen completed 10/08/2013 Patient was offered extended adjuvant therapy but she declined)    Current treatment: Tamoxifen 10 mg daily (patient refused radiation)  blood clots October 2022: Tamoxifen has been discontinued since then    Breast cancer surveillance: 1.  Breast exam 08/31/2022: Benign, palpable nodularity at the site of surgery 2.  Mammogram 09/07/2021: Benign breast density category B, next mammogram scheduled for 10/18/2022   Bilateral lower extremity pain: July 2023: Ultrasound lower extremity: No DVT   Return to clinic in 1 yr for  follow-up    No orders of the defined types were placed in this encounter.  The patient has a good understanding of the overall plan. she agrees with it. she will call with any problems that may develop before the next visit here. Total time spent: 30 mins including face to face time and time spent for planning, charting and co-ordination of care   Harriette Ohara, MD 08/31/22    I Gardiner Coins am acting as a Education administrator for [employer]  I have reviewed the above documentation for accuracy and completeness, and I agree with the above.

## 2022-08-31 ENCOUNTER — Other Ambulatory Visit: Payer: Self-pay

## 2022-08-31 ENCOUNTER — Inpatient Hospital Stay: Payer: Medicare Other | Attending: Hematology and Oncology | Admitting: Hematology and Oncology

## 2022-08-31 VITALS — BP 155/76 | HR 94 | Temp 97.9°F | Resp 14 | Ht 62.0 in | Wt 145.2 lb

## 2022-08-31 DIAGNOSIS — Z923 Personal history of irradiation: Secondary | ICD-10-CM | POA: Insufficient documentation

## 2022-08-31 DIAGNOSIS — Z853 Personal history of malignant neoplasm of breast: Secondary | ICD-10-CM | POA: Diagnosis not present

## 2022-08-31 DIAGNOSIS — Z79899 Other long term (current) drug therapy: Secondary | ICD-10-CM | POA: Diagnosis not present

## 2022-08-31 DIAGNOSIS — Z86711 Personal history of pulmonary embolism: Secondary | ICD-10-CM | POA: Diagnosis not present

## 2022-08-31 MED ORDER — OMRON 5 SERIES BP MONITOR DEVI: 1.0000 [IU] | Freq: Every day | 0 refills | Status: AC

## 2022-08-31 NOTE — Assessment & Plan Note (Addendum)
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  Breast exam 08/31/2022: Benign, palpable nodularity at the site of surgery 2.  Mammogram 09/07/2021: Benign breast density category B, next mammogram scheduled for 10/18/2022   Bilateral lower extremity pain: July 2023: Ultrasound lower extremity: No DVT   Return to clinic in 1 yr for follow-up

## 2022-10-18 ENCOUNTER — Ambulatory Visit
Admission: RE | Admit: 2022-10-18 | Discharge: 2022-10-18 | Disposition: A | Discharge status: Home / Self Care | Payer: Medicare Other | Source: Ambulatory Visit | Attending: Hematology and Oncology | Admitting: Hematology and Oncology

## 2022-10-18 DIAGNOSIS — Z1231 Encounter for screening mammogram for malignant neoplasm of breast: Secondary | ICD-10-CM | POA: Diagnosis not present

## 2022-10-22 ENCOUNTER — Other Ambulatory Visit: Payer: Self-pay

## 2022-10-22 NOTE — Telephone Encounter (Signed)
Patient of Dr. Tamala Julian. Please review for refill. Thank you!

## 2022-10-24 ENCOUNTER — Other Ambulatory Visit: Payer: Self-pay

## 2022-10-24 MED ORDER — METOPROLOL SUCCINATE ER 25 MG PO TB24: 25.0000 mg | ORAL_TABLET | Freq: Every day | ORAL | 0 refills | Status: DC

## 2022-10-25 ENCOUNTER — Telehealth: Payer: Self-pay | Admitting: Internal Medicine

## 2022-10-25 NOTE — Telephone Encounter (Signed)
Ok to fill amlodipine 5 mg daily #90 3 refills

## 2022-10-25 NOTE — Telephone Encounter (Signed)
MEDICATION:amLODipine (NORVASC) 5 MG tablet  PHARMACY:[id number] [place], [place] - [address]   Comments: Patient said her medication was prescribed by a different doctor and would like for it to be continued by Dr. CSharlet Salina **Let patient know to contact pharmacy at the end of the day to make sure medication is ready. **  ** Please notify patient to allow 48-72 hours to process**  **Encourage patient to contact the pharmacy for refills or [facility]

## 2022-10-26 ENCOUNTER — Other Ambulatory Visit: Payer: Self-pay

## 2022-10-26 DIAGNOSIS — I10 Essential (primary) hypertension: Secondary | ICD-10-CM

## 2022-10-26 MED ORDER — AMLODIPINE BESYLATE 5 MG PO TABS: 5.0000 mg | ORAL_TABLET | Freq: Every day | ORAL | 3 refills | Status: DC

## 2022-10-26 NOTE — Telephone Encounter (Signed)
Refill has been sent in.  

## 2022-11-12 ENCOUNTER — Ambulatory Visit (HOSPITAL_BASED_OUTPATIENT_CLINIC_OR_DEPARTMENT_OTHER): Payer: Medicare Other | Admitting: Cardiovascular Disease

## 2022-11-13 DIAGNOSIS — M170 Bilateral primary osteoarthritis of knee: Secondary | ICD-10-CM | POA: Diagnosis not present

## 2022-11-20 DIAGNOSIS — M170 Bilateral primary osteoarthritis of knee: Secondary | ICD-10-CM | POA: Diagnosis not present

## 2022-11-27 DIAGNOSIS — M170 Bilateral primary osteoarthritis of knee: Secondary | ICD-10-CM | POA: Diagnosis not present

## 2022-11-30 ENCOUNTER — Ambulatory Visit (INDEPENDENT_AMBULATORY_CARE_PROVIDER_SITE_OTHER): Payer: Medicare Other | Admitting: Internal Medicine

## 2022-11-30 ENCOUNTER — Encounter: Payer: Self-pay | Admitting: Internal Medicine

## 2022-11-30 VITALS — BP 140/68 | HR 82 | Temp 98.5°F | Ht 62.0 in | Wt 147.0 lb

## 2022-11-30 DIAGNOSIS — L608 Other nail disorders: Secondary | ICD-10-CM | POA: Diagnosis not present

## 2022-11-30 DIAGNOSIS — Z86711 Personal history of pulmonary embolism: Secondary | ICD-10-CM

## 2022-11-30 DIAGNOSIS — R079 Chest pain, unspecified: Secondary | ICD-10-CM | POA: Diagnosis not present

## 2022-11-30 NOTE — Progress Notes (Signed)
   Subjective:   Patient ID: Shannon Jensen, female    DOB: 15-Aug-1928, 89 y.o.   MRN: [id number]  HPI The patient is a 89 YO female coming in for follow up with questions.   Review of Systems  Constitutional: Negative.   HENT: Negative.    Eyes: Negative.   Respiratory:  Negative for cough, chest tightness and shortness of breath.   Cardiovascular:  Positive for leg swelling. Negative for chest pain and palpitations.  Gastrointestinal:  Negative for abdominal distention, abdominal pain, constipation, diarrhea, nausea and vomiting.  Musculoskeletal: Negative.   Skin: Negative.   Neurological: Negative.   Psychiatric/Behavioral: Negative.      Objective:  Physical Exam Constitutional:      Appearance: She is well-developed.  HENT:     Head: Normocephalic and atraumatic.  Cardiovascular:     Rate and Rhythm: Normal rate and regular rhythm.  Pulmonary:     Effort: Pulmonary effort is normal. No respiratory distress.     Breath sounds: Normal breath sounds. No wheezing or rales.  Abdominal:     General: Bowel sounds are normal. There is no distension.     Palpations: Abdomen is soft.     Tenderness: There is no abdominal tenderness. There is no rebound.  Musculoskeletal:     Cervical back: Normal range of motion.     Right lower leg: Edema present.     Left lower leg: Edema present.  Skin:    General: Skin is warm and dry.  Neurological:     Mental Status: She is alert and oriented to person, place, and time.     Coordination: Coordination normal.     Vitals:   11/30/22 1003  BP: (!) 140/68  Pulse: 82  Temp: 98.5 F (36.9 C)  TempSrc: Oral  SpO2: 99%  Weight: 147 lb (66.7 kg)  Height: 5\' 2"  (1.575 m)    Assessment & Plan:  Visit time 25 minutes in face to face communication with patient and coordination of care, additional 5 minutes spent in record review, coordination or care, ordering tests, communicating/referring to other healthcare professionals,  documenting in medical records all on the same day of the visit for total time 30 minutes spent on the visit.

## 2022-11-30 NOTE — Assessment & Plan Note (Signed)
Needs referral to podiatry for help in trimming nails which are thickened. She has tried vaseline to soften and this has not helped adequately.

## 2022-11-30 NOTE — Patient Instructions (Addendum)
Last time we did check the cholesterol and the kidneys and the liver and the blood counts and the sugars and these were normal.   We will get you in with the foot specialist.   Try taking B12 1000 mcg daily over the counter to see if this helps with the feet.   You can try a multivitamin to help with the nails.

## 2022-11-30 NOTE — Assessment & Plan Note (Signed)
Stable swelling lower extremity and suspect multifactorial including prior DVT and venous insufficiency. Reviewed recent CMP and echo and no indication of organ dysfunction causing swelling.

## 2022-11-30 NOTE — Assessment & Plan Note (Signed)
Some new right chest wall pain in the last few days not changed by exertion. Some change with eating late at night. Taking pantoprazole and advised to move this to morning to see if this helps more.

## 2022-12-03 ENCOUNTER — Telehealth: Payer: Self-pay | Admitting: Internal Medicine

## 2022-12-04 NOTE — Telephone Encounter (Signed)
Patient wants to discuss after visit summary from last visit.  She would like to discuss this

## 2022-12-04 NOTE — Telephone Encounter (Signed)
Discussed this patient and have also sent her lab results again

## 2022-12-18 ENCOUNTER — Ambulatory Visit (INDEPENDENT_AMBULATORY_CARE_PROVIDER_SITE_OTHER): Payer: Medicare Other | Admitting: Cardiovascular Disease

## 2022-12-18 ENCOUNTER — Encounter (HOSPITAL_BASED_OUTPATIENT_CLINIC_OR_DEPARTMENT_OTHER): Payer: Self-pay | Admitting: Cardiovascular Disease

## 2022-12-18 VITALS — BP 146/68 | HR 88 | Ht 62.0 in | Wt 146.6 lb

## 2022-12-18 DIAGNOSIS — I2699 Other pulmonary embolism without acute cor pulmonale: Secondary | ICD-10-CM

## 2022-12-18 DIAGNOSIS — I10 Essential (primary) hypertension: Secondary | ICD-10-CM

## 2022-12-18 DIAGNOSIS — R7303 Prediabetes: Secondary | ICD-10-CM | POA: Insufficient documentation

## 2022-12-18 DIAGNOSIS — I471 Supraventricular tachycardia, unspecified: Secondary | ICD-10-CM | POA: Diagnosis not present

## 2022-12-18 DIAGNOSIS — Z6826 Body mass index (BMI) 26.0-26.9, adult: Secondary | ICD-10-CM | POA: Diagnosis not present

## 2022-12-18 DIAGNOSIS — Z5181 Encounter for therapeutic drug level monitoring: Secondary | ICD-10-CM | POA: Diagnosis not present

## 2022-12-18 HISTORY — DX: Prediabetes: R73.03

## 2022-12-18 MED ORDER — OLMESARTAN MEDOXOMIL 20 MG PO TABS: 20.0000 mg | ORAL_TABLET | Freq: Every day | ORAL | 3 refills | Status: DC

## 2022-12-18 NOTE — Progress Notes (Signed)
Cardiology Office Note:    Date:  12/18/2022   ID:  Shannon Jensen, DOB 1928-05-01, MRN [id number]  PCP:  Myrlene Brokerrawford, Elizabeth A, MD   [facility] Providers Cardiologist:  Chilton Siiffany Kulpmont, MD     Referring MD: Myrlene Brokerrawford, Elizabeth A, *   No chief complaint on file.   History of Present Illness:    Shannon Jensen is a 89 y.o. female with a hx of hypertension, hyperlipidemia, HFpEF,breast cancer s/p lumpectomy and Tamoxifen, and  pulmonary embolism here to establish care.   She has a history of PE 06/2021 tamoxifen was discontinued due to concern that it may have caused her PE. ECHO at the time revealed LVEF 65-70% LVEH and mild grade 1 diastolic dysfunction. PASP was 43mmHg. Pulmonary pressures normalized on repeat echo. She wore a monitor 08/2021 with frequent episodes of SVT lasting up to one minute, and rare PVCs. Metoprolol was increased.  Today, the patient presents with her son to help provide history. She states that she has been having trouble with arthritis in her knees, hips, and lower back. Her knees have improved after her injections. Her right knee is worse than her left knee. She exercises with stretching morning and night. She denies any exertional symptoms. She has occasional sharp pains in her lower ribcage which occur when she is in bed, she believes them to be indigestion. She sometimes loses her breath for a short time in bed and suddenly she has to breathe for a second. She has some numbness in her legs, specifically in her left leg. She sustains occasional headaches but is unable to take her blood pressure due to being by herself. Her blood pressure in clinic is 140/70 and was 146/68 on recheck.  She denies any palpitations, chest pain, or peripheral edema. No lightheadedness, syncope, orthopnea, or PND.  Past Medical History:  Diagnosis Date   Anxiety and depression    Arthritis    Breast cancer 08/2008   Right Breast Cancer   Breast cancer 2019    Left Breast Cancer   Chronic headaches    Cystocele    Diverticulosis    Family history of breast cancer    Family history of colon cancer    Family history of pancreatic cancer    Family history of prostate cancer    GERD (gastroesophageal reflux disease)    H/O vitamin D deficiency    HLD (hyperlipidemia)    Hypertension    Osteoarthritis    Osteoporosis    Pelvic relaxation    Personal history of radiation therapy 2009   Right Breast Cancer   Pre-diabetes 12/18/2022   Pulmonary embolism 06/2021   Urinary tract infection 2014    Past Surgical History:  Procedure Laterality Date   ABDOMINAL HYSTERECTOMY  1998   APPENDECTOMY  1970   BREAST LUMPECTOMY Right 2009   BREAST LUMPECTOMY Left 2019   BREAST LUMPECTOMY WITH RADIOACTIVE SEED LOCALIZATION Left 11/19/2017   Procedure: BREAST LUMPECTOMY WITH RADIOACTIVE SEED LOCALIZATION;  Surgeon: Emelia LoronWakefield, Matthew, MD;  Location: [facility] OR;  Service: General;  Laterality: Left;   BREAST SURGERY  2009   cancer-lymph node removal   NECK SURGERY     cyst removal    Current Medications: Current Meds  Medication Sig   acetaminophen (TYLENOL) 325 MG tablet Take 650 mg by mouth every 6 (six) hours as needed for moderate pain or headache.    amLODipine (NORVASC) 5 MG tablet Take 1 tablet (5 mg total) by mouth daily.  Blood Pressure Monitoring (OMRON 5 SERIES BP MONITOR) DEVI 1 Units by Does not apply route daily.   Cholecalciferol (VITAMIN D) 2000 units CAPS Take 2,000 Units by mouth daily.   metoprolol succinate (TOPROL XL) 25 MG 24 hr tablet Take 1 tablet (25 mg total) by mouth daily.   Multiple Vitamins-Minerals (WOMENS ONE DAILY) TABS Take 1 tablet by mouth daily.     olmesartan (BENICAR) 20 MG tablet Take 1 tablet (20 mg total) by mouth daily.   Omega-3 Fatty Acids (FISH OIL) 1200 MG CAPS Take 1,200 mg by mouth daily.      Allergies:   Celecoxib, Erythromycin, Nabumetone, Nsaids, Oxycodone-acetaminophen, Tyloxapol, and Vioxx  [rofecoxib]   Social History   Socioeconomic History   Marital status: Widowed    Spouse name: Not on file   Number of children: 4   Years of education: 64   Highest education level: 12th grade  Occupational History   Occupation: retired    Associate Professor: RETIRED  Tobacco Use   Smoking status: Never    Passive exposure: Never   Smokeless tobacco: Never  Vaping Use   Vaping Use: Never used  Substance and Sexual Activity   Alcohol use: No   Drug use: No   Sexual activity: Never  Other Topics Concern   Not on file  Social History Narrative   Not on file   Social Determinants of Health   Financial Resource Strain: Low Risk  (06/29/2021)   Overall Financial Resource Strain (CARDIA)    Difficulty of Paying Living Expenses: Not hard at all  Food Insecurity: No Food Insecurity (06/29/2021)   Hunger Vital Sign    Worried About Running Out of Food in the Last Year: Never true    Ran Out of Food in the Last Year: Never true  Transportation Needs: No Transportation Needs (08/09/2021)   PRAPARE - Administrator, Civil Service (Medical): No    Lack of Transportation (Non-Medical): No  Physical Activity: Inactive (06/29/2021)   Exercise Vital Sign    Days of Exercise per Week: 0 days    Minutes of Exercise per Session: 0 min  Stress: No Stress Concern Present (06/29/2021)   [employer] of Occupational Health - Occupational Stress Questionnaire    Feeling of Stress : Not at all  Social Connections: Moderately Isolated (06/29/2021)   Social Connection and Isolation Panel [NHANES]    Frequency of Communication with Friends and Family: More than three times a week    Frequency of Social Gatherings with Friends and Family: More than three times a week    Attends Religious Services: More than 4 times per year    Active Member of [employer] or Organizations: No    Attends Banker Meetings: Never    Marital Status: Widowed     Family History: The patient's  family history includes Appendicitis in her daughter; Breast cancer in her sister; Colon cancer in her mother; Colon polyps in her daughter; Heart disease in her paternal uncle; Pancreatic cancer in her brother; Pancreatic cancer (age of onset: 29) in her sister; Prostate cancer in her brother and nephew; Prostate cancer (age of onset: 54) in her father; Stroke in her daughter.  ROS:   Please see the history of present illness.    (+) Knee, hip, and lower back arthralgias (+) Numbness  All other systems reviewed and are negative.  EKGs/Labs/Other Studies Reviewed:    The following studies were reviewed today:  EKG:  EKG is personally  reviewed 12/18/2022: NSR Rate 88bpm, low voltage, PAC.  Recent Labs: 07/24/2022: ALT 14; BUN 16; Creatinine, Ser 0.81; Hemoglobin 12.8; Platelets 222.0; Potassium 3.6; Sodium 138; TSH 0.90  Recent Lipid Panel    Component Value Date/Time   CHOL 204 (H) 07/24/2022 1449   CHOL 193 07/20/2021 1524   TRIG 86.0 07/24/2022 1449   HDL 47.90 07/24/2022 1449   HDL 50 07/20/2021 1524   CHOLHDL 4 07/24/2022 1449   VLDL 17.2 07/24/2022 1449   LDLCALC 139 (H) 07/24/2022 1449   LDLCALC 121 (H) 07/20/2021 1524   LDLDIRECT 142.5 11/08/2008 1022     Risk Assessment/Calculations:      HYPERTENSION CONTROL Vitals:   12/18/22 1621 12/18/22 1719  BP: (!) 140/70 (!) 146/68    The patient's blood pressure is elevated above target today.  In order to address the patient's elevated BP: A new medication was prescribed today.            Physical Exam:    VS:  BP (!) 146/68 (BP Location: Right Arm, Patient Position: Sitting, Cuff Size: Normal)   Pulse 88   Ht  (1.575 m)   Wt 146 lb 9.6 oz (66.5 kg)   BMI 26.81 kg/m  , BMI Body mass index is 26.81 kg/m. GENERAL:  Well appearing HEENT: Pupils equal round and reactive, fundi not visualized, oral mucosa unremarkable NECK:  No jugular venous distention, waveform within normal limits, carotid upstroke brisk  and symmetric, no bruits, no thyromegaly LUNGS:  Clear to auscultation bilaterally HEART:  RRR.  PMI not displaced or sustained,S1 and S2 within normal limits, no S3, no S4, no clicks, no rubs, no murmurs ABD:  Flat, positive bowel sounds normal in frequency in pitch, no bruits, no rebound, no guarding, no midline pulsatile mass, no hepatomegaly, no splenomegaly EXT:  2 plus pulses throughout, no edema, no cyanosis no clubbing SKIN:  No rashes no nodules NEURO:  Cranial nerves II through XII grossly intact, motor grossly intact throughout PSYCH:  Cognitively intact, oriented to person place and time   ASSESSMENT:    1. SVT (supraventricular tachycardia)   2. Essential hypertension   3. Therapeutic drug monitoring   4. BMI 26.0-26.9,adult   5. Pre-diabetes   6. Pulmonary embolism, other, unspecified chronicity, unspecified whether acute cor pulmonale present    PLAN:    SVT (supraventricular tachycardia) (HCC) Managed on metoprolol.  None noted on EKG.   Essential hypertension Blood pressure uncontrolled on amlodipine and metoprolol. Encouraged her to increase her exercise.  She reports that BP was better controlled on amlodipine and olmesartan.  Will add olmesartan .  Check BMP in one week.  Once her dose is determined, will attempt to consolidate.    Pre-diabetes Fasting blood glucose was 101.  She wants to know what her hemoglobin A1c is.  This is reasonable.  When she gets her BMP we will check her blood A1c as well.  Pulmonary embolism Patient is very concerned that her prior PE DVT or PE to increase her exercise.  She expressed understanding  FU: Kylyn Sookram C. Duke Salvia, MD, [facility] in 1-2 months.   Medication Adjustments/Labs and Tests Ordered: Current medicines are reviewed at length with the patient today.  Concerns regarding medicines are outlined above.  Orders Placed This Encounter  Procedures   Basic metabolic panel   HgB A1c   EKG 12-Lead   Meds ordered this  encounter  Medications   olmesartan (BENICAR) 20 MG tablet    Sig: Take 1  tablet (20 mg total) by mouth daily.    Dispense:  90 tablet    Refill:  3    Patient Instructions  Medication Instructions:  START OLMESARTAN 20 MG DAILY   Labwork: BMET/A1C IN 1 WEEK   Testing/Procedures: NONE  Follow-Up: 02/07/2023 10:45 AM WITH DR    Any Other Special Instructions Will Be Listed Below (If Applicable). MONITOR YOUR BLOOD PRESSURE TWICE A DAY, LOG IN THE BOOK PROVIDED. BRING THE BOOK AND YOUR BLOOD PRESSURE MACHINE TO YOUR FOLLOW UP     If you need a refill on your cardiac medications before your next appointment, please call your pharmacy.   I,Coren O'Brien,acting as a Neurosurgeon for [employer], MD.,have documented all relevant documentation on the behalf of Chilton Si, MD,as directed by  Chilton Si, MD while in the presence of Chilton Si, MD.  I, Kore Madlock C. Duke Salvia, MD have reviewed all documentation for this visit.  The documentation of the exam, diagnosis, procedures, and orders on 12/18/2022 are all accurate and complete.   Time spent: 45 minutes-Greater than 50% of this time was spent in counseling, explanation of diagnosis, planning of further management, and coordination of care.

## 2022-12-18 NOTE — Assessment & Plan Note (Addendum)
Blood pressure uncontrolled on amlodipine and metoprolol. Encouraged her to increase her exercise.  She reports that BP was better controlled on amlodipine and olmesartan.  Will add olmesartan 20mg .  Check BMP in one week.  Once her dose is determined, will attempt to consolidate.

## 2022-12-18 NOTE — Assessment & Plan Note (Signed)
Patient is very concerned that her prior PE DVT or PE to increase her exercise.  She expressed understanding

## 2022-12-18 NOTE — Assessment & Plan Note (Signed)
Managed on metoprolol.  None noted on EKG.

## 2022-12-18 NOTE — Patient Instructions (Addendum)
Medication Instructions:  START OLMESARTAN 20 MG DAILY   Labwork: BMET/A1C IN 1 WEEK   Testing/Procedures: NONE  Follow-Up: 02/07/2023 10:45 AM WITH DR Tsaile   Any Other Special Instructions Will Be Listed Below (If Applicable). MONITOR YOUR BLOOD PRESSURE TWICE A DAY, LOG IN THE BOOK PROVIDED. BRING THE BOOK AND YOUR BLOOD PRESSURE MACHINE TO YOUR FOLLOW UP     If you need a refill on your cardiac medications before your next appointment, please call your pharmacy.

## 2022-12-18 NOTE — Assessment & Plan Note (Signed)
Fasting blood glucose was 101.  She wants to know what her hemoglobin A1c is.  This is reasonable.  When she gets her BMP we will check her blood A1c as well.

## 2022-12-24 ENCOUNTER — Telehealth: Payer: Self-pay | Admitting: Cardiovascular Disease

## 2022-12-24 NOTE — Telephone Encounter (Signed)
Patient called stating she is suppose to come in tomorrow for lab work. She has some questions about the instructions that were given to her, she wouldn't go into further detail.

## 2022-12-24 NOTE — Telephone Encounter (Signed)
Returned call to patient,   Patient states she has labs tomorrow, she wanted to clarify whether or not she needed to be fasting for her labs tomorrow.   She states she has a blood pressure cuff, but she isn't sure how to use it and she was asked to check her blood pressure prior to her next appointment. Offered patient a nurse visit at 1pm tomorrow, she states she will have to see what time her ride is bringing her.

## 2022-12-25 DIAGNOSIS — I10 Essential (primary) hypertension: Secondary | ICD-10-CM | POA: Diagnosis not present

## 2022-12-25 DIAGNOSIS — Z5181 Encounter for therapeutic drug level monitoring: Secondary | ICD-10-CM | POA: Diagnosis not present

## 2022-12-26 LAB — BASIC METABOLIC PANEL
BUN/Creatinine Ratio: 18 (ref 12–28)
BUN: 17 mg/dL (ref 10–36)
CO2: 20 mmol/L (ref 20–29)
Calcium: 10.3 mg/dL (ref 8.7–10.3)
Chloride: 104 mmol/L (ref 96–106)
Creatinine, Ser: 0.94 mg/dL (ref 0.57–1.00)
Glucose: 95 mg/dL (ref 70–99)
Potassium: 4.3 mmol/L (ref 3.5–5.2)
Sodium: 141 mmol/L (ref 134–144)
eGFR: 57 mL/min/{1.73_m2} — ABNORMAL LOW (ref >59)

## 2022-12-26 LAB — HEMOGLOBIN A1C
Est. average glucose Bld gHb Est-mCnc: 105 mg/dL
Hgb A1c MFr Bld: 5.3 % (ref 4.8–5.6)

## 2023-01-04 DIAGNOSIS — M170 Bilateral primary osteoarthritis of knee: Secondary | ICD-10-CM | POA: Diagnosis not present

## 2023-01-04 IMAGING — US US THYROID
1 series · 13 of 25 positions shown · non-contrast
Comparison: Prior thyroid ultrasound 07/09/2019; 07/04/2012

CLINICAL DATA: Goiter. History of multiple thyroid nodules. Patient
previously underwent biopsy of the left mid thyroid nodule in
Wednesday July, 2012.

EXAM:
THYROID ULTRASOUND
TECHNIQUE: Ultrasound examination of the thyroid gland and adjacent soft
tissues was performed.

[Series 1: us thyroid · 0.05mm/px · 40 acquisitions, 13 frames shown]
[im 1/40]
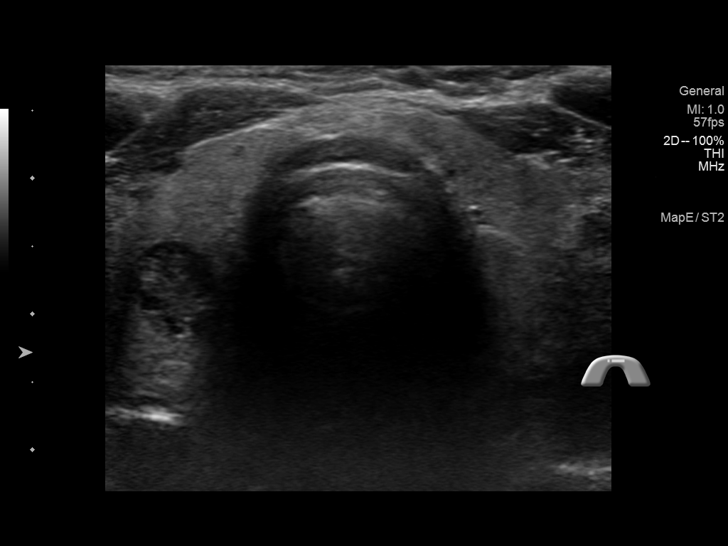
[im 4/40]
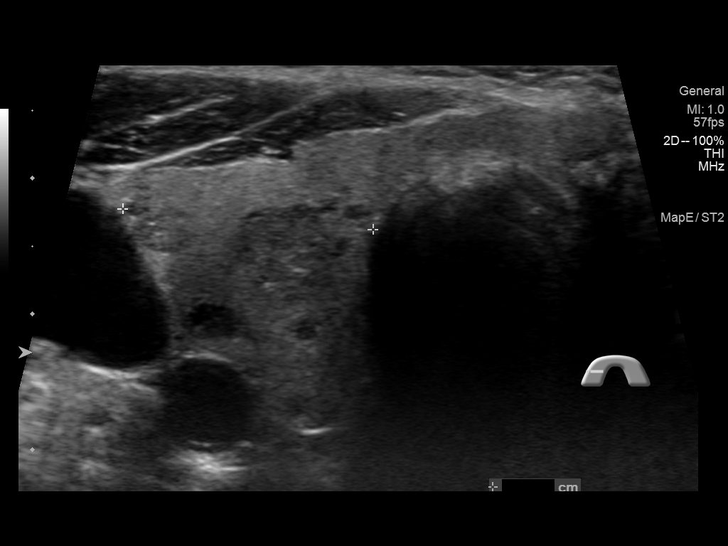
[im 7/40]
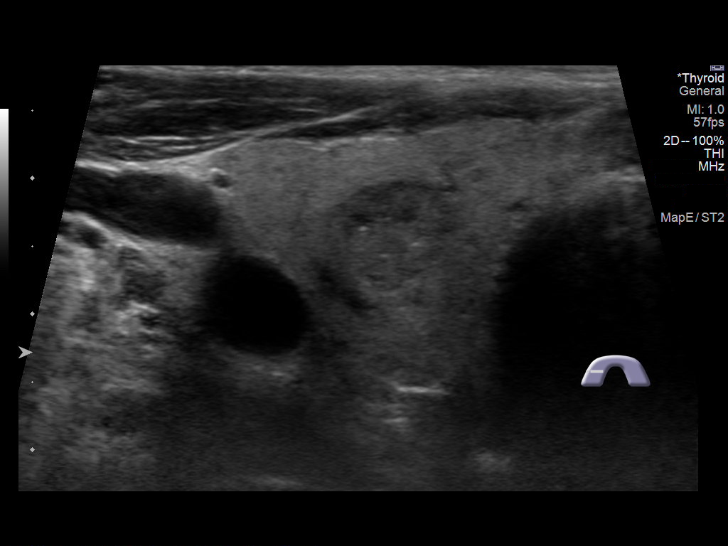
[im 10/40]
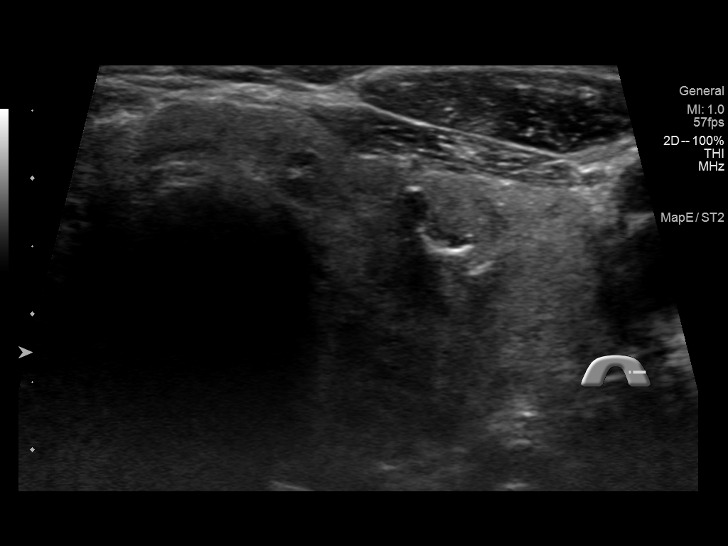
[im 14/40]
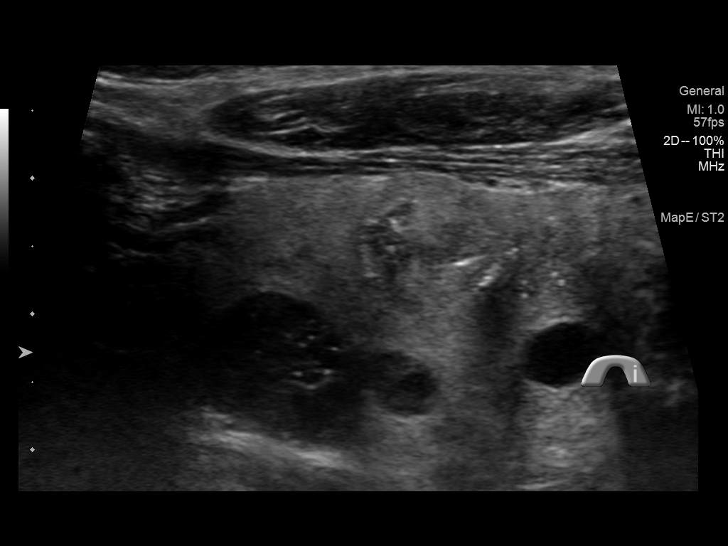
[im 17/40]
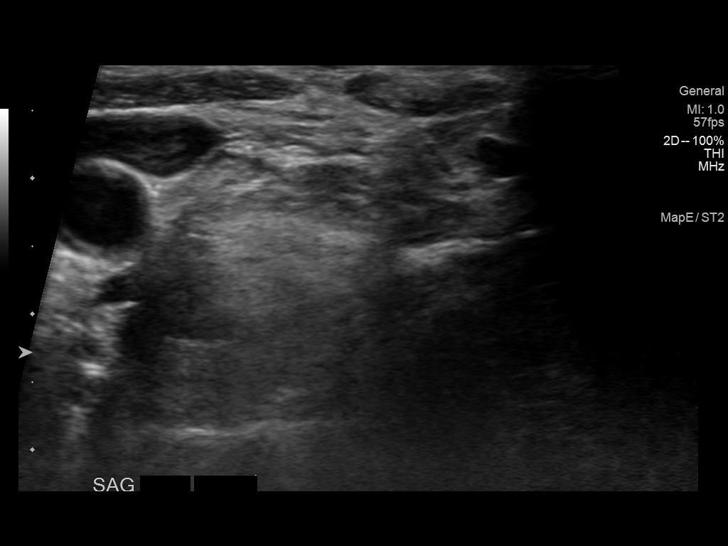
[im 20/40]
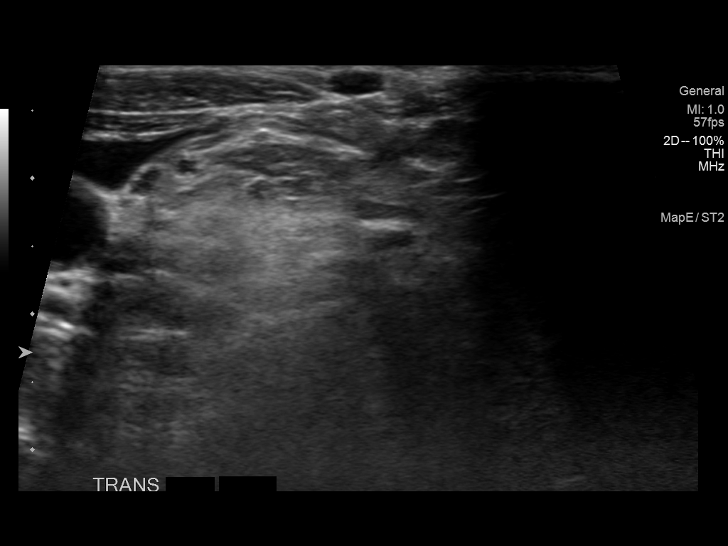
[im 23/40]
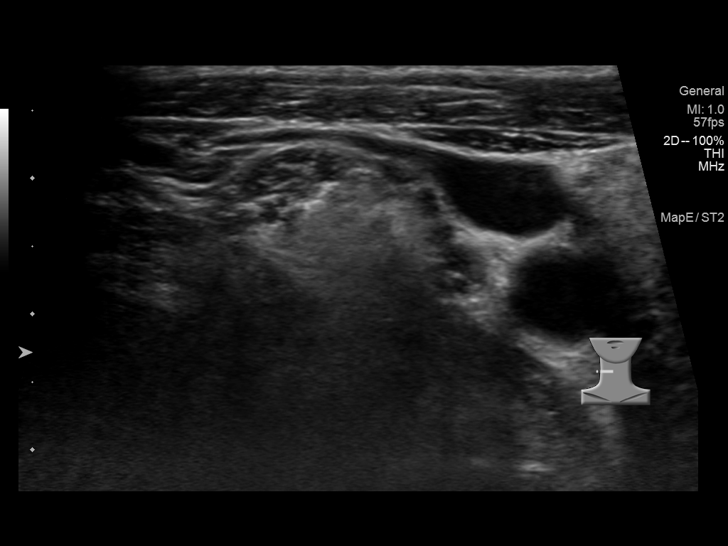
[im 27/40]
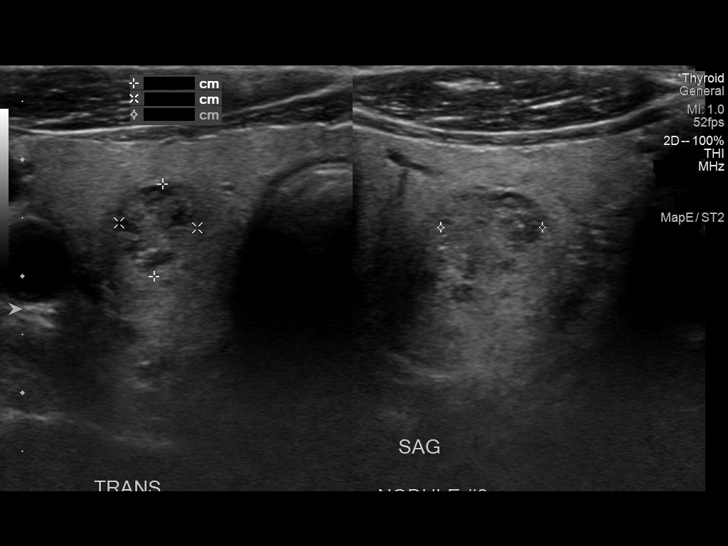
[im 30/40]
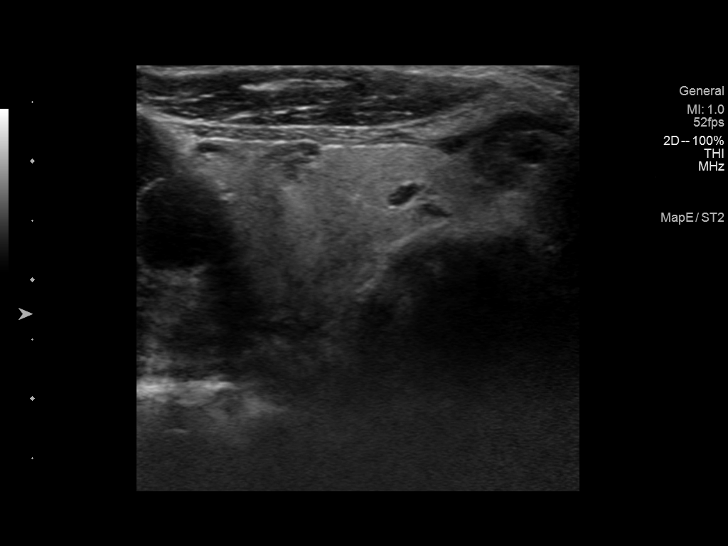
[im 33/40]
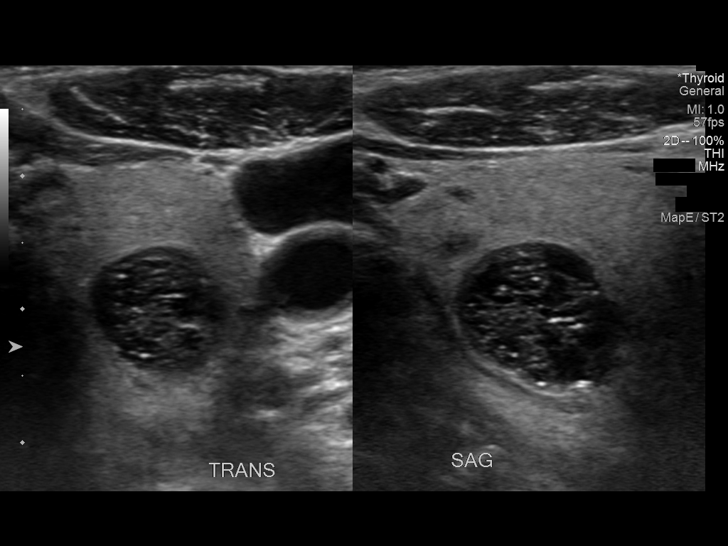
[im 36/40]
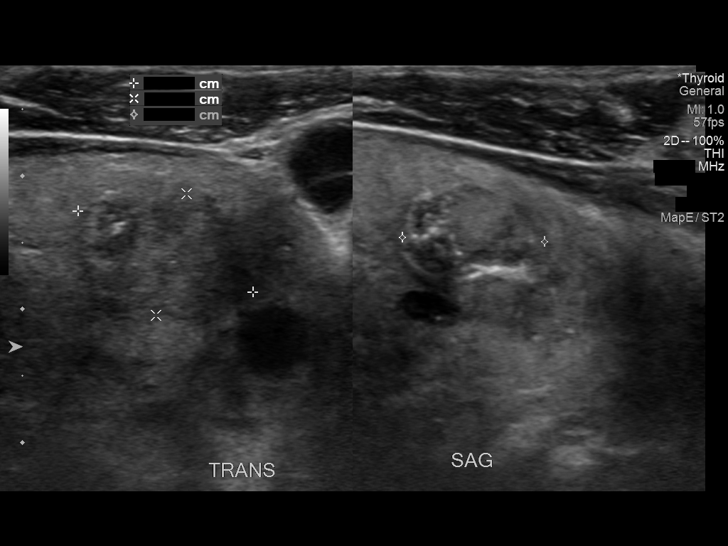
[im 40/40]
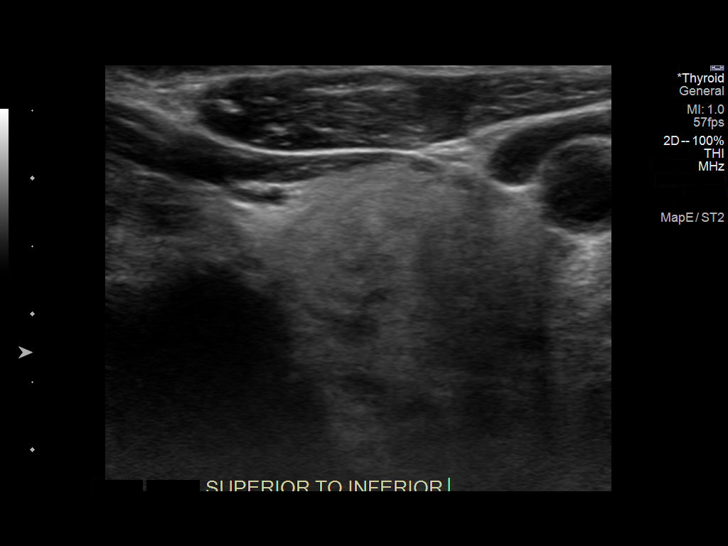

[13 of 25 positions shown; findings below may reference images not displayed]

FINDINGS: Parenchymal Echotexture: Mildly heterogenous

Isthmus: 0.4 cm

Right lobe: 4.3 x 1.6 x 1.9 cm

Left lobe: 3.1 x 2.0 x 2.0 cm

_________________________________________________________

Estimated total number of nodules >/= 1 cm: 2

Number of spongiform nodules >/=  2 cm not described below (TR1): 0

Number of mixed cystic and solid nodules >/= 1.5 cm not described
below (TR2): 0

_________________________________________________________

The previously biopsied nodule in the left mid gland remains stable
at 1.2 x 0.9 x 1.1 cm. Numerous additional small subcentimeter cysts
and nodules again noted scattered throughout the gland. Nodules are
either similar or smaller compared to prior imaging consistent with
benignity.

No new nodules or suspicious features are identified.
IMPRESSION: No new nodules or suspicious features identified.

Continued stability of previously biopsied left mid thyroid nodule.
Greater than 5 year stability is consistent with benignity. No
further follow-up is recommended.

## 2023-01-16 ENCOUNTER — Ambulatory Visit: Payer: BC Managed Care – PPO | Admitting: Podiatry

## 2023-01-22 ENCOUNTER — Other Ambulatory Visit: Payer: Self-pay | Admitting: Nurse Practitioner

## 2023-01-23 ENCOUNTER — Encounter: Payer: Self-pay | Admitting: Podiatry

## 2023-01-23 ENCOUNTER — Ambulatory Visit (INDEPENDENT_AMBULATORY_CARE_PROVIDER_SITE_OTHER): Payer: Medicare Other | Admitting: Podiatry

## 2023-01-23 DIAGNOSIS — M79675 Pain in left toe(s): Secondary | ICD-10-CM | POA: Diagnosis not present

## 2023-01-23 DIAGNOSIS — M79674 Pain in right toe(s): Secondary | ICD-10-CM | POA: Diagnosis not present

## 2023-01-23 DIAGNOSIS — L608 Other nail disorders: Secondary | ICD-10-CM | POA: Diagnosis not present

## 2023-01-23 DIAGNOSIS — L603 Nail dystrophy: Secondary | ICD-10-CM | POA: Diagnosis not present

## 2023-01-23 DIAGNOSIS — B351 Tinea unguium: Secondary | ICD-10-CM | POA: Diagnosis not present

## 2023-01-23 DIAGNOSIS — L814 Other melanin hyperpigmentation: Secondary | ICD-10-CM | POA: Diagnosis not present

## 2023-01-23 NOTE — Telephone Encounter (Signed)
Rx request sent to pharmacy.  

## 2023-01-23 NOTE — Progress Notes (Signed)
  Subjective:  Patient ID: Shannon Jensen, female    DOB: October 31, 1927,   MRN: [id number]  Chief Complaint  Patient presents with   Foot Problem    Patient present for pain in the toes, nail fungus, and RFC, Pain in the toes are intermittent, started years ago, no prior treatment     89 y.o. female presents for concern of thickened elongated and painful nails that are difficult to trim. Requesting to have them trimmed today. Patient unable to take care of nails herself. She is pre-diabetic. Relates she has concern for fungus and would like to have them checked.   PCP:  Myrlene Broker, MD    . Denies any other pedal complaints. Denies n/v/f/c.   Past Medical History:  Diagnosis Date   Anxiety and depression    Arthritis    Breast cancer (HCC) 08/2008   Right Breast Cancer   Breast cancer (HCC) 2019   Left Breast Cancer   Chronic headaches    Cystocele    Diverticulosis    Family history of breast cancer    Family history of colon cancer    Family history of pancreatic cancer    Family history of prostate cancer    GERD (gastroesophageal reflux disease)    H/O vitamin D deficiency    HLD (hyperlipidemia)    Hypertension    Osteoarthritis    Osteoporosis    Pelvic relaxation    Personal history of radiation therapy 2009   Right Breast Cancer   Pre-diabetes 12/18/2022   Pulmonary embolism (HCC) 06/2021   Urinary tract infection 2014    Objective:  Physical Exam: Vascular: DP/PT pulses 2/4 bilateral. CFT <3 seconds. Normal hair growth on digits. No edema.  Skin. No lacerations or abrasions bilateral feet. Nails 1-5 bilateral are thickened and dystrophic with subungual debris Musculoskeletal: MMT 5/5 bilateral lower extremities in DF, PF, Inversion and Eversion. Deceased ROM in DF of ankle joint.  Hammered digits 2 and 3 bilateral. 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  Neurological: Sensation intact to light touch.   Assessment:   1. Pain due to onychomycosis of  toenails of both feet   2. Onychodystrophy      Plan:  Patient was evaluated and treated and all questions answered. -Mechanically debrided all nails 1-5 bilateral using sterile nail nipper and filed with dremel without incident  -Examined patient -Discussed treatment options for painful dystrophic nails  -Clinical picture and Fungal culture was obtained by removing a portion of the hard nail itself from each of the involved toenails using a sterile nail nipper and sent to [facility] lab. Patient tolerated the biopsy procedure well without discomfort or need for anesthesia.  -Discussed fungal nail treatment options including oral, topical, and laser treatments.  -Patient to return in 4 weeks for follow up evaluation and discussion of fungal culture results or sooner if symptoms worsen.    Louann Sjogren, DPM

## 2023-01-23 NOTE — Addendum Note (Signed)
Addended by: Rolly Salter on: 01/23/2023 04:04 PM   Modules accepted: Orders

## 2023-01-29 DIAGNOSIS — M79642 Pain in left hand: Secondary | ICD-10-CM | POA: Diagnosis not present

## 2023-02-07 ENCOUNTER — Encounter (HOSPITAL_BASED_OUTPATIENT_CLINIC_OR_DEPARTMENT_OTHER): Payer: Self-pay | Admitting: Cardiovascular Disease

## 2023-02-07 ENCOUNTER — Ambulatory Visit (INDEPENDENT_AMBULATORY_CARE_PROVIDER_SITE_OTHER): Payer: Medicare Other | Admitting: Cardiovascular Disease

## 2023-02-07 VITALS — BP 172/80 | HR 72 | Ht 62.0 in | Wt 153.6 lb

## 2023-02-07 DIAGNOSIS — I10 Essential (primary) hypertension: Secondary | ICD-10-CM | POA: Diagnosis not present

## 2023-02-07 DIAGNOSIS — I471 Supraventricular tachycardia, unspecified: Secondary | ICD-10-CM | POA: Diagnosis not present

## 2023-02-07 DIAGNOSIS — Z5181 Encounter for therapeutic drug level monitoring: Secondary | ICD-10-CM

## 2023-02-07 DIAGNOSIS — E785 Hyperlipidemia, unspecified: Secondary | ICD-10-CM | POA: Diagnosis not present

## 2023-02-07 MED ORDER — OLMESARTAN-AMLODIPINE-HCTZ 20-5-12.5 MG PO TABS: 1.0000 | ORAL_TABLET | Freq: Every day | ORAL | 3 refills | Status: DC

## 2023-02-07 NOTE — Progress Notes (Signed)
Cardiology Office Note:    Date:  02/07/2023   ID:  Shannon Jensen, DOB 03-20-1928, MRN [id number]  PCP:  Myrlene Broker, MD   [facility] Providers Cardiologist:  Chilton Si, MD     Referring MD: Myrlene Broker, *   CC:  Hypertension  History of Present Illness:    Shannon Jensen is a 89 y.o. female with a hx of hypertension, hyperlipidemia, HFpEF, breast cancer s/p lumpectomy and Tamoxifen, and  pulmonary embolism here for follow-up.   She has a history of PE 06/2021 tamoxifen was discontinued due to concern that it may have caused her PE. ECHO at the time revealed LVEF 65-70%, LVH and mild grade 1 diastolic dysfunction. PASP was . Pulmonary pressures normalized on repeat echo. She wore a monitor 08/2021 with frequent episodes of SVT lasting up to one minute, and rare PVCs. Metoprolol was increased.  At her appointment 12/2022 she reported orthopedic concerns and occasional atypical chest pain.  Blood pressure was uncontrolled.  She was encouraged to increase her exercise and olmesartan was added.  Today, she is accompanied by a family member. She states that recently she was started on prednisone for fluid retention in her left hand and wrist which has been improving. Her dose is being tapered down. However, the prednisone has been "taking its toll" on her along with her arthritis. In clinic today her blood pressure is elevated to 156/75; on recheck her BP is 172/80. She presents a BP log which is personally reviewed and shows mostly 110s to 140s systolic at home. She has been taking amlodipine at night, and olmesartan at 10 AM. Sometimes she experiences heart flutters. Occasionally her sleep is interrupted. She also complains of a new slight pulling/strained sensation in her upper chest and neck. This is mostly noticeable when she wakes up in the mornings, and is exacerbated with neck movements. Regarding her diet, she may have some fried foods but avoids  most sweets and adding salt. She tries to eat more vegetables and salads, often has rice. Maybe a piece of ham or bacon every once in a while. She denies any shortness of breath, lightheadedness, headaches, syncope, orthopnea, or PND.  Past Medical History:  Diagnosis Date   Anxiety and depression    Arthritis    Breast cancer (HCC) 08/2008   Right Breast Cancer   Breast cancer (HCC) 2019   Left Breast Cancer   Chronic headaches    Cystocele    Diverticulosis    Family history of breast cancer    Family history of colon cancer    Family history of pancreatic cancer    Family history of prostate cancer    GERD (gastroesophageal reflux disease)    H/O vitamin D deficiency    HLD (hyperlipidemia)    Hypertension    Osteoarthritis    Osteoporosis    Pelvic relaxation    Personal history of radiation therapy 2009   Right Breast Cancer   Pre-diabetes 12/18/2022   Pulmonary embolism (HCC) 06/2021   Urinary tract infection 2014    Past Surgical History:  Procedure Laterality Date   ABDOMINAL HYSTERECTOMY  1998   APPENDECTOMY  1970   BREAST LUMPECTOMY Right 2009   BREAST LUMPECTOMY Left 2019   BREAST LUMPECTOMY WITH RADIOACTIVE SEED LOCALIZATION Left 11/19/2017   Procedure: BREAST LUMPECTOMY WITH RADIOACTIVE SEED LOCALIZATION;  Surgeon: Emelia Loron, MD;  Location: [facility] OR;  Service: General;  Laterality: Left;   BREAST SURGERY  2009  cancer-lymph node removal   NECK SURGERY     cyst removal    Current Medications: Current Meds  Medication Sig   acetaminophen (TYLENOL) 325 MG tablet Take 650 mg by mouth every 6 (six) hours as needed for moderate pain or headache.    Blood Pressure Monitoring (OMRON 5 SERIES BP MONITOR) DEVI 1 Units by Does not apply route daily.   Cholecalciferol (VITAMIN D) 2000 units CAPS Take 2,000 Units by mouth daily.   metoprolol succinate (TOPROL-XL) 25 MG 24 hr tablet TAKE 1 TABLET (25 MG TOTAL) BY MOUTH DAILY.   Multiple Vitamins-Minerals  (WOMENS ONE DAILY) TABS Take 1 tablet by mouth daily.     Olmesartan-amLODIPine-HCTZ (TRIBENZOR) 20-5-12.5 MG TABS Take 1 tablet by mouth daily.   Omega-3 Fatty Acids (FISH OIL) 1200 MG CAPS Take 1,200 mg by mouth daily.    pantoprazole (PROTONIX) 40 MG tablet Take 40 mg by mouth daily.   predniSONE (DELTASONE) 10 MG tablet Take 10 mg by mouth.   [DISCONTINUED] amLODipine (NORVASC) 5 MG tablet Take 1 tablet (5 mg total) by mouth daily.   [DISCONTINUED] olmesartan (BENICAR) 20 MG tablet Take 1 tablet (20 mg total) by mouth daily.     Allergies:   Celecoxib, Erythromycin, Nabumetone, Nsaids, Oxycodone-acetaminophen, Tyloxapol, and Vioxx [rofecoxib]   Social History   Socioeconomic History   Marital status: Widowed    Spouse name: Not on file   Number of children: 4   Years of education: 75   Highest education level: 12th grade  Occupational History   Occupation: retired    Associate Professor: RETIRED  Tobacco Use   Smoking status: Never    Passive exposure: Never   Smokeless tobacco: Never  Vaping Use   Vaping Use: Never used  Substance and Sexual Activity   Alcohol use: No   Drug use: No   Sexual activity: Never  Other Topics Concern   Not on file  Social History Narrative   Not on file   Social Determinants of Health   Financial Resource Strain: Low Risk  (06/29/2021)   Overall Financial Resource Strain (CARDIA)    Difficulty of Paying Living Expenses: Not hard at all  Food Insecurity: No Food Insecurity (06/29/2021)   Hunger Vital Sign    Worried About Running Out of Food in the Last Year: Never true    Ran Out of Food in the Last Year: Never true  Transportation Needs: No Transportation Needs (08/09/2021)   PRAPARE - Administrator, Civil Service (Medical): No    Lack of Transportation (Non-Medical): No  Physical Activity: Inactive (06/29/2021)   Exercise Vital Sign    Days of Exercise per Week: 0 days    Minutes of Exercise per Session: 0 min  Stress: No  Stress Concern Present (06/29/2021)   [employer] of Occupational Health - Occupational Stress Questionnaire    Feeling of Stress : Not at all  Social Connections: Moderately Isolated (06/29/2021)   Social Connection and Isolation Panel [NHANES]    Frequency of Communication with Friends and Family: More than three times a week    Frequency of Social Gatherings with Friends and Family: More than three times a week    Attends Religious Services: More than 4 times per year    Active Member of [employer] or Organizations: No    Attends Banker Meetings: Never    Marital Status: Widowed     Family History: The patient's family history includes Appendicitis in her daughter; Breast  cancer in her sister; Colon cancer in her mother; Colon polyps in her daughter; Heart disease in her paternal uncle; Pancreatic cancer in her brother; Pancreatic cancer (age of onset: 92) in her sister; Prostate cancer in her brother and nephew; Prostate cancer (age of onset: 19) in her father; Stroke in her daughter.  ROS:   Please see the history of present illness.    (+) Palpitations (+) Upper chest/neck discomfort. (+) Arthralgias All other systems reviewed and are negative.  EKGs/Labs/Other Studies Reviewed:    The following studies were reviewed today:  LE Venous Doppler  03/21/2022: Summary:  BILATERAL:  - No evidence of deep vein thrombosis seen in the lower extremities,  bilaterally.  -No evidence of popliteal cyst, bilaterally.   Echo  10/20/2021:  1. Left ventricular ejection fraction, by estimation, is 60 to 65%. Left  ventricular ejection fraction by 3D volume is 64 %. The left ventricle has  normal function. The left ventricle has no regional wall motion  abnormalities. There is mild left  ventricular hypertrophy. Left ventricular diastolic parameters are  consistent with Grade I diastolic dysfunction (impaired relaxation). The  average left ventricular global longitudinal  strain is -16.5 %.   2. Right ventricular systolic function is normal. The right ventricular  size is normal. There is mildly elevated pulmonary artery systolic  pressure. The estimated right ventricular systolic pressure is 35.9 mmHg.   3. Left atrial size was mildly dilated.   4. The mitral valve is normal in structure. Trivial mitral valve  regurgitation. No evidence of mitral stenosis.   5. The aortic valve is tricuspid. Aortic valve regurgitation is not  visualized. No aortic stenosis is present.   6. The inferior vena cava is normal in size with greater than 50%  respiratory variability, suggesting right atrial pressure of 3 mmHg.   Comparison(s): 06/14/21 EF 65-70%. PA pressure .   Monitor 08/2021: Basic rhythm is normal sinus Frequent SVT (480) with longest episode lasting 58 seconds at 123 bpm. Other shorter episodes with fastest 171 bpm Rare PVC's and < 4 beat ventricular salvos.   Recommendation: Increase metoprolol to 37.5 mg BID; Continue to measure BP and HR.   Patch Wear Time:  14 days and 0 hours (2022-12-05T17:33:09-498 to 2022-12-19T17:33:01-499)   Patient had a min HR of 56 bpm, max HR of 187 bpm, and avg HR of 73 bpm. Predominant underlying rhythm was Sinus Rhythm. 4 Ventricular Tachycardia runs occurred, the run with the fastest interval lasting 11 beats with a max rate of 187 bpm, the longest  lasting 12 beats with an avg rate of 129 bpm. 480 Supraventricular Tachycardia runs occurred, the run with the fastest interval lasting 9.4 secs with a max rate of 171 bpm, the longest lasting 57.8 secs with an avg rate of 123 bpm. Isolated SVEs were  occasional (2.0%, 16109), SVE Couplets were rare (<1.0%, 1072), and SVE Triplets were rare (<1.0%, 309). Isolated VEs were rare (<1.0%, 456), VE Couplets were rare (<1.0%, 7), and VE Triplets were rare (<1.0%, 1). Ventricular Bigeminy was present.   EKG:  EKG is personally reviewed. 02/07/2023:  EKG was not  ordered. 12/18/2022: NSR Rate 88bpm, low voltage, PAC.  Recent Labs: 07/24/2022: ALT 14; Hemoglobin 12.8; Platelets 222.0; TSH 0.90 12/25/2022: BUN 17; Creatinine, Ser 0.94; Potassium 4.3; Sodium 141   Recent Lipid Panel    Component Value Date/Time   CHOL 204 (H) 07/24/2022 1449   CHOL 193 07/20/2021 1524   TRIG 86.0 07/24/2022 1449  HDL 47.90 07/24/2022 1449   HDL 50 07/20/2021 1524   CHOLHDL 4 07/24/2022 1449   VLDL 17.2 07/24/2022 1449   LDLCALC 139 (H) 07/24/2022 1449   LDLCALC 121 (H) 07/20/2021 1524   LDLDIRECT 142.5 11/08/2008 1022     Risk Assessment/Calculations:     HYPERTENSION CONTROL Vitals:   02/07/23 1056 02/07/23 1130  BP: (!) 156/75 (!) 172/80    The patient's blood pressure is elevated above target today.  In order to address the patient's elevated BP:        Physical Exam:    VS:  BP (!) 172/80 (BP Location: Left Arm, Patient Position: Sitting, Cuff Size: Large)   Pulse 72   Ht 5\' 2"  (1.575 m)   Wt 153 lb 9.6 oz (69.7 kg)   SpO2 100%   BMI 28.09 kg/m  , BMI Body mass index is 28.09 kg/m. GENERAL:  Well appearing HEENT: Pupils equal round and reactive, fundi not visualized, oral mucosa unremarkable NECK:  No jugular venous distention, waveform within normal limits, carotid upstroke brisk and symmetric, no bruits, no thyromegaly LUNGS:  Clear to auscultation bilaterally HEART:  RRR.  PMI not displaced or sustained,S1 and S2 within normal limits, no S3, no S4, no clicks, no rubs, no murmurs ABD:  Flat, positive bowel sounds normal in frequency in pitch, no bruits, no rebound, no guarding, no midline pulsatile mass, no hepatomegaly, no splenomegaly EXT:  2 plus pulses throughout, no edema, no cyanosis no clubbing SKIN:  No rashes no nodules NEURO:  Cranial nerves II through XII grossly intact, motor grossly intact throughout PSYCH:  Cognitively intact, oriented to person place and time   ASSESSMENT:    1. Essential hypertension   2. SVT  (supraventricular tachycardia)   3. Hyperlipidemia LDL goal <130   4. Therapeutic drug monitoring     PLAN:    Essential hypertension Blood pressure uncontrolled here and at home.  She would like to consolidate her medication and BP is high.  We will stop amlodipine and olmesartan and start tribenzor 20/5/12.5 mg.  Check BMP in one week.  BP goal <130/80.  It is higher than usual now due to taking prednisone.  SVT (supraventricular tachycardia) (HCC) Stable on metoprolol.  Hyperlipidemia LDL goal <130 Continue working on diet and exercise.  Disposition: FU with Kayton Ripp C. Duke Salvia, MD, [facility] in 6 weeks.  Medication Adjustments/Labs and Tests Ordered: Current medicines are reviewed at length with the patient today.  Concerns regarding medicines are outlined above.   Orders Placed This Encounter  Procedures   Basic metabolic panel   Meds ordered this encounter  Medications   Olmesartan-amLODIPine-HCTZ (TRIBENZOR) 20-5-12.5 MG TABS    Sig: Take 1 tablet by mouth daily.    Dispense:  90 tablet    Refill:  3    D/C AMLODIPINE AND OLMESARTAN   I,Mathew Stumpf,acting as a scribe for Chilton Si, MD.,have documented all relevant documentation on the behalf of Chilton Si, MD,as directed by  Chilton Si, MD while in the presence of Chilton Si, MD.  I, Haleem Hanner C. Duke Salvia, MD have reviewed all documentation for this visit.  The documentation of the exam, diagnosis, procedures, and orders on 02/07/2023 are all accurate and complete.   Signed, Chilton Si, MD 02/07/2023  12:28 PM [facility]

## 2023-02-07 NOTE — Patient Instructions (Addendum)
Medication Instructions:  STOP AMLODIPINE   STOP OLMESARTAN   START OLMESARTAN-AMLODIPINE-HCTZ 20-5-12.5 MG DAILY   Labwork: BMET IN 1 WEEK   Testing/Procedures: NONE  Follow-Up: 03/20/23 9:15 am with Dr Duke Salvia   If you need a refill on your cardiac medications before your next appointment, please call your pharmacy.

## 2023-02-07 NOTE — Assessment & Plan Note (Signed)
Stable on metoprolol. °

## 2023-02-07 NOTE — Progress Notes (Deleted)
Cardiology Office Note:    Date:  02/07/2023   ID:  Shannon Jensen, DOB Jun 16, 1928, MRN [id number]  PCP:  Myrlene Broker, MD   [facility] Providers Cardiologist:  Chilton Si, MD     Referring MD: Myrlene Broker, *   No chief complaint on file.   History of Present Illness:    Shannon Jensen is a 89 y.o. female with a hx of hypertension, hyperlipidemia, HFpEF, breast cancer s/p lumpectomy and Tamoxifen, and  pulmonary embolism here for follow-up.   She has a history of PE 06/2021 tamoxifen was discontinued due to concern that it may have caused her PE. ECHO at the time revealed LVEF 65-70%, LVH and mild grade 1 diastolic dysfunction. PASP was . Pulmonary pressures normalized on repeat echo. She wore a monitor 08/2021 with frequent episodes of SVT lasting up to one minute, and rare PVCs. Metoprolol was increased.  At her appointment 12/2022 she reported orthopedic concerns and occasional atypical chest pain.  Blood pressure was uncontrolled.  She was encouraged to increase her exercise and olmesartan was added.   Past Medical History:  Diagnosis Date   Anxiety and depression    Arthritis    Breast cancer (HCC) 08/2008   Right Breast Cancer   Breast cancer (HCC) 2019   Left Breast Cancer   Chronic headaches    Cystocele    Diverticulosis    Family history of breast cancer    Family history of colon cancer    Family history of pancreatic cancer    Family history of prostate cancer    GERD (gastroesophageal reflux disease)    H/O vitamin D deficiency    HLD (hyperlipidemia)    Hypertension    Osteoarthritis    Osteoporosis    Pelvic relaxation    Personal history of radiation therapy 2009   Right Breast Cancer   Pre-diabetes 12/18/2022   Pulmonary embolism (HCC) 06/2021   Urinary tract infection 2014    Past Surgical History:  Procedure Laterality Date   ABDOMINAL HYSTERECTOMY  1998   APPENDECTOMY  1970   BREAST LUMPECTOMY Right  2009   BREAST LUMPECTOMY Left 2019   BREAST LUMPECTOMY WITH RADIOACTIVE SEED LOCALIZATION Left 11/19/2017   Procedure: BREAST LUMPECTOMY WITH RADIOACTIVE SEED LOCALIZATION;  Surgeon: Emelia Loron, MD;  Location: [facility] OR;  Service: General;  Laterality: Left;   BREAST SURGERY  2009   cancer-lymph node removal   NECK SURGERY     cyst removal    Current Medications: No outpatient medications have been marked as taking for the 02/07/23 encounter (Appointment) with Chilton Si, MD.     Allergies:   Celecoxib, Erythromycin, Nabumetone, Nsaids, Oxycodone-acetaminophen, Tyloxapol, and Vioxx [rofecoxib]   Social History   Socioeconomic History   Marital status: Widowed    Spouse name: Not on file   Number of children: 4   Years of education: 58   Highest education level: 12th grade  Occupational History   Occupation: retired    Associate Professor: RETIRED  Tobacco Use   Smoking status: Never    Passive exposure: Never   Smokeless tobacco: Never  Vaping Use   Vaping Use: Never used  Substance and Sexual Activity   Alcohol use: No   Drug use: No   Sexual activity: Never  Other Topics Concern   Not on file  Social History Narrative   Not on file   Social Determinants of Health   Financial Resource Strain: Low Risk  (06/29/2021)  Overall Financial Resource Strain (CARDIA)    Difficulty of Paying Living Expenses: Not hard at all  Food Insecurity: No Food Insecurity (06/29/2021)   Hunger Vital Sign    Worried About Running Out of Food in the Last Year: Never true    Ran Out of Food in the Last Year: Never true  Transportation Needs: No Transportation Needs (08/09/2021)   PRAPARE - Administrator, Civil Service (Medical): No    Lack of Transportation (Non-Medical): No  Physical Activity: Inactive (06/29/2021)   Exercise Vital Sign    Days of Exercise per Week: 0 days    Minutes of Exercise per Session: 0 min  Stress: No Stress Concern Present (06/29/2021)    [employer] of Occupational Health - Occupational Stress Questionnaire    Feeling of Stress : Not at all  Social Connections: Moderately Isolated (06/29/2021)   Social Connection and Isolation Panel [NHANES]    Frequency of Communication with Friends and Family: More than three times a week    Frequency of Social Gatherings with Friends and Family: More than three times a week    Attends Religious Services: More than 4 times per year    Active Member of [employer] or Organizations: No    Attends Banker Meetings: Never    Marital Status: Widowed     Family History: The patient's family history includes Appendicitis in her daughter; Breast cancer in her sister; Colon cancer in her mother; Colon polyps in her daughter; Heart disease in her paternal uncle; Pancreatic cancer in her brother; Pancreatic cancer (age of onset: 45) in her sister; Prostate cancer in her brother and nephew; Prostate cancer (age of onset: 79) in her father; Stroke in her daughter.  ROS:   Please see the history of present illness.    (+) Knee, hip, and lower back arthralgias (+) Numbness  All other systems reviewed and are negative.  EKGs/Labs/Other Studies Reviewed:    The following studies were reviewed today:  EKG:  EKG is personally reviewed 12/18/2022: NSR Rate 88bpm, low voltage, PAC.  Recent Labs: 07/24/2022: ALT 14; Hemoglobin 12.8; Platelets 222.0; TSH 0.90 12/25/2022: BUN 17; Creatinine, Ser 0.94; Potassium 4.3; Sodium 141  Recent Lipid Panel    Component Value Date/Time   CHOL 204 (H) 07/24/2022 1449   CHOL 193 07/20/2021 1524   TRIG 86.0 07/24/2022 1449   HDL 47.90 07/24/2022 1449   HDL 50 07/20/2021 1524   CHOLHDL 4 07/24/2022 1449   VLDL 17.2 07/24/2022 1449   LDLCALC 139 (H) 07/24/2022 1449   LDLCALC 121 (H) 07/20/2021 1524   LDLDIRECT 142.5 11/08/2008 1022     Risk Assessment/Calculations:      No BP recorded.  {Refresh Note OR Click here to enter BP  :1}***          Physical Exam:    VS:  There were no vitals taken for this visit. , BMI There is no height or weight on file to calculate BMI. GENERAL:  Well appearing HEENT: Pupils equal round and reactive, fundi not visualized, oral mucosa unremarkable NECK:  No jugular venous distention, waveform within normal limits, carotid upstroke brisk and symmetric, no bruits, no thyromegaly LUNGS:  Clear to auscultation bilaterally HEART:  RRR.  PMI not displaced or sustained,S1 and S2 within normal limits, no S3, no S4, no clicks, no rubs, no murmurs ABD:  Flat, positive bowel sounds normal in frequency in pitch, no bruits, no rebound, no guarding, no midline pulsatile mass, no  hepatomegaly, no splenomegaly EXT:  2 plus pulses throughout, no edema, no cyanosis no clubbing SKIN:  No rashes no nodules NEURO:  Cranial nerves II through XII grossly intact, motor grossly intact throughout PSYCH:  Cognitively intact, oriented to person place and time   ASSESSMENT:    No diagnosis found.  PLAN:    No problem-specific Assessment & Plan notes found for this encounter.   FU: Dariyon Urquilla C. Duke Salvia, MD, [facility] in 1-2 months.   Medication Adjustments/Labs and Tests Ordered: Current medicines are reviewed at length with the patient today.  Concerns regarding medicines are outlined above.  No orders of the defined types were placed in this encounter.  No orders of the defined types were placed in this encounter.   There are no Patient Instructions on file for this visit.

## 2023-02-07 NOTE — Assessment & Plan Note (Addendum)
Blood pressure uncontrolled here and at home.  She would like to consolidate her medication and BP is high.  We will stop amlodipine and olmesartan and start tribenzor 20/5/12.5 mg.  Check BMP in one week.  BP goal <130/80.  It is higher than usual now due to taking prednisone.

## 2023-02-07 NOTE — Assessment & Plan Note (Signed)
Continue working on diet and exercise.

## 2023-02-20 ENCOUNTER — Ambulatory Visit (INDEPENDENT_AMBULATORY_CARE_PROVIDER_SITE_OTHER): Payer: Medicare Other | Admitting: Podiatry

## 2023-02-20 ENCOUNTER — Encounter: Payer: Self-pay | Admitting: Podiatry

## 2023-02-20 DIAGNOSIS — L603 Nail dystrophy: Secondary | ICD-10-CM

## 2023-02-20 NOTE — Progress Notes (Signed)
  Subjective:  Patient ID: Shannon Jensen, female    DOB: November 17, 1927,   MRN: [id number]  Chief Complaint  Patient presents with   Nail Problem    Fungus f/u    89 y.o. female presents for follow-up o=f fungal nail and for culture.   PCP:  Myrlene Broker, MD    . Denies any other pedal complaints. Denies n/v/f/c.   Past Medical History:  Diagnosis Date   Anxiety and depression    Arthritis    Breast cancer (HCC) 08/2008   Right Breast Cancer   Breast cancer (HCC) 2019   Left Breast Cancer   Chronic headaches    Cystocele    Diverticulosis    Family history of breast cancer    Family history of colon cancer    Family history of pancreatic cancer    Family history of prostate cancer    GERD (gastroesophageal reflux disease)    H/O vitamin D deficiency    HLD (hyperlipidemia)    Hypertension    Osteoarthritis    Osteoporosis    Pelvic relaxation    Personal history of radiation therapy 2009   Right Breast Cancer   Pre-diabetes 12/18/2022   Pulmonary embolism (HCC) 06/2021   Urinary tract infection 2014    Objective:  Physical Exam: Vascular: DP/PT pulses 2/4 bilateral. CFT <3 seconds. Normal hair growth on digits. No edema.  Skin. No lacerations or abrasions bilateral feet. Nails 1-5 bilateral are thickened and dystrophic with subungual debris Musculoskeletal: MMT 5/5 bilateral lower extremities in DF, PF, Inversion and Eversion. Deceased ROM in DF of ankle joint.  Hammered digits 2 and 3 bilateral. 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  Neurological: Sensation intact to light touch.   Assessment:   1. Onychodystrophy       Plan:  Patient was evaluated and treated and all questions answered. -Mechanically debrided all nails 1-5 bilateral using sterile nail nipper and filed with dremel without incident as courtesy.  -Examined patient -Discussed treatment options for painful dystrophic nails  -Cultures reviewed and negative for any fungus there is melanin  present.  -Discussed using urea nail gel to help and shoe modification.   -Patient to return in 4 weeks for follow up evaluation and discussion of fungal culture results or sooner if symptoms worsen.    Louann Sjogren, DPM

## 2023-03-04 DIAGNOSIS — I10 Essential (primary) hypertension: Secondary | ICD-10-CM | POA: Diagnosis not present

## 2023-03-04 DIAGNOSIS — H401132 Primary open-angle glaucoma, bilateral, moderate stage: Secondary | ICD-10-CM | POA: Diagnosis not present

## 2023-03-04 DIAGNOSIS — H04123 Dry eye syndrome of bilateral lacrimal glands: Secondary | ICD-10-CM | POA: Diagnosis not present

## 2023-03-13 DIAGNOSIS — M25561 Pain in right knee: Secondary | ICD-10-CM | POA: Diagnosis not present

## 2023-03-15 DIAGNOSIS — M1711 Unilateral primary osteoarthritis, right knee: Secondary | ICD-10-CM | POA: Diagnosis not present

## 2023-03-15 DIAGNOSIS — Z5181 Encounter for therapeutic drug level monitoring: Secondary | ICD-10-CM | POA: Diagnosis not present

## 2023-03-15 DIAGNOSIS — I10 Essential (primary) hypertension: Secondary | ICD-10-CM | POA: Diagnosis not present

## 2023-03-16 LAB — BASIC METABOLIC PANEL
BUN/Creatinine Ratio: 15 (ref 12–28)
BUN: 20 mg/dL (ref 10–36)
CO2: 23 mmol/L (ref 20–29)
Calcium: 9.9 mg/dL (ref 8.7–10.3)
Chloride: 102 mmol/L (ref 96–106)
Creatinine, Ser: 1.32 mg/dL — ABNORMAL HIGH (ref 0.57–1.00)
Glucose: 95 mg/dL (ref 70–99)
Potassium: 4.1 mmol/L (ref 3.5–5.2)
Sodium: 139 mmol/L (ref 134–144)
eGFR: 38 mL/min/{1.73_m2} — ABNORMAL LOW (ref >59)

## 2023-03-20 ENCOUNTER — Telehealth: Payer: Self-pay | Admitting: Cardiovascular Disease

## 2023-03-20 ENCOUNTER — Ambulatory Visit (INDEPENDENT_AMBULATORY_CARE_PROVIDER_SITE_OTHER): Payer: Medicare Other | Admitting: Cardiovascular Disease

## 2023-03-20 ENCOUNTER — Encounter (HOSPITAL_BASED_OUTPATIENT_CLINIC_OR_DEPARTMENT_OTHER): Payer: Self-pay | Admitting: Cardiovascular Disease

## 2023-03-20 VITALS — BP 128/64 | HR 78 | Ht 62.0 in | Wt 146.9 lb

## 2023-03-20 DIAGNOSIS — I2699 Other pulmonary embolism without acute cor pulmonale: Secondary | ICD-10-CM

## 2023-03-20 DIAGNOSIS — I471 Supraventricular tachycardia, unspecified: Secondary | ICD-10-CM

## 2023-03-20 DIAGNOSIS — I10 Essential (primary) hypertension: Secondary | ICD-10-CM

## 2023-03-20 NOTE — Telephone Encounter (Signed)
Mailed lab results to patient as requested. Jim Like MHA RN CCM

## 2023-03-20 NOTE — Progress Notes (Deleted)
Cardiology Office Note:    Date:  03/20/2023   ID:  Shannon Jensen, DOB 05-May-1928, MRN [id number]  PCP:  Myrlene Broker, MD   [facility] Providers Cardiologist:  Chilton Si, MD     Referring MD: Myrlene Broker, *   CC:  Hypertension  History of Present Illness:    Shannon Jensen is a 89 y.o. female with a hx of hypertension, hyperlipidemia, HFpEF, breast cancer s/p lumpectomy and Tamoxifen, and pulmonary embolism here for follow-up.   She has a history of PE 06/2021 tamoxifen was discontinued due to concern that it may have caused her PE. ECHO at the time revealed LVEF 65-70%, LVH and mild grade 1 diastolic dysfunction. PASP was . Pulmonary pressures normalized on repeat echo. She wore a monitor 08/2021 with frequent episodes of SVT lasting up to one minute, and rare PVCs. Metoprolol was increased.  At her appointment 12/2022 she reported orthopedic concerns and occasional atypical chest pain.  Blood pressure was uncontrolled.  She was encouraged to increase her exercise and olmesartan was added.  At her visit 01/2023 blood pressure remained controlled.  Blood pressure in the office was 156/75 initially and 172/80 on repeat.  Her home blood pressure was running in the 110s to 140s.  She was switched from olmesartan and amlodipine to Tribenzor.  Creatinine increased from 0.94-1.3.    Past Medical History:  Diagnosis Date   Anxiety and depression    Arthritis    Breast cancer (HCC) 08/2008   Right Breast Cancer   Breast cancer (HCC) 2019   Left Breast Cancer   Chronic headaches    Cystocele    Diverticulosis    Family history of breast cancer    Family history of colon cancer    Family history of pancreatic cancer    Family history of prostate cancer    GERD (gastroesophageal reflux disease)    H/O vitamin D deficiency    HLD (hyperlipidemia)    Hypertension    Osteoarthritis    Osteoporosis    Pelvic relaxation    Personal history of  radiation therapy 2009   Right Breast Cancer   Pre-diabetes 12/18/2022   Pulmonary embolism (HCC) 06/2021   Urinary tract infection 2014    Past Surgical History:  Procedure Laterality Date   ABDOMINAL HYSTERECTOMY  1998   APPENDECTOMY  1970   BREAST LUMPECTOMY Right 2009   BREAST LUMPECTOMY Left 2019   BREAST LUMPECTOMY WITH RADIOACTIVE SEED LOCALIZATION Left 11/19/2017   Procedure: BREAST LUMPECTOMY WITH RADIOACTIVE SEED LOCALIZATION;  Surgeon: Emelia Loron, MD;  Location: [facility] OR;  Service: General;  Laterality: Left;   BREAST SURGERY  2009   cancer-lymph node removal   NECK SURGERY     cyst removal    Current Medications: No outpatient medications have been marked as taking for the 03/20/23 encounter (Appointment) with Chilton Si, MD.     Allergies:   Celecoxib, Erythromycin, Nabumetone, Nsaids, Oxycodone-acetaminophen, Tyloxapol, and Vioxx [rofecoxib]   Social History   Socioeconomic History   Marital status: Widowed    Spouse name: Not on file   Number of children: 4   Years of education: 71   Highest education level: 12th grade  Occupational History   Occupation: retired    Associate Professor: RETIRED  Tobacco Use   Smoking status: Never    Passive exposure: Never   Smokeless tobacco: Never  Vaping Use   Vaping Use: Never used  Substance and Sexual Activity   Alcohol  use: No   Drug use: No   Sexual activity: Never  Other Topics Concern   Not on file  Social History Narrative   Not on file   Social Determinants of Health   Financial Resource Strain: Low Risk  (06/29/2021)   Overall Financial Resource Strain (CARDIA)    Difficulty of Paying Living Expenses: Not hard at all  Food Insecurity: No Food Insecurity (06/29/2021)   Hunger Vital Sign    Worried About Running Out of Food in the Last Year: Never true    Ran Out of Food in the Last Year: Never true  Transportation Needs: No Transportation Needs (08/09/2021)   PRAPARE - Scientist, research (physical sciences) (Medical): No    Lack of Transportation (Non-Medical): No  Physical Activity: Inactive (06/29/2021)   Exercise Vital Sign    Days of Exercise per Week: 0 days    Minutes of Exercise per Session: 0 min  Stress: No Stress Concern Present (06/29/2021)   [employer] of Occupational Health - Occupational Stress Questionnaire    Feeling of Stress : Not at all  Social Connections: Moderately Isolated (06/29/2021)   Social Connection and Isolation Panel [NHANES]    Frequency of Communication with Friends and Family: More than three times a week    Frequency of Social Gatherings with Friends and Family: More than three times a week    Attends Religious Services: More than 4 times per year    Active Member of [employer] or Organizations: No    Attends Banker Meetings: Never    Marital Status: Widowed     Family History: The patient's family history includes Appendicitis in her daughter; Breast cancer in her sister; Colon cancer in her mother; Colon polyps in her daughter; Heart disease in her paternal uncle; Pancreatic cancer in her brother; Pancreatic cancer (age of onset: 63) in her sister; Prostate cancer in her brother and nephew; Prostate cancer (age of onset: 18) in her father; Stroke in her daughter.  ROS:   Please see the history of present illness.    (+) Palpitations (+) Upper chest/neck discomfort. (+) Arthralgias All other systems reviewed and are negative.  EKGs/Labs/Other Studies Reviewed:    The following studies were reviewed today:  LE Venous Doppler  03/21/2022: Summary:  BILATERAL:  - No evidence of deep vein thrombosis seen in the lower extremities,  bilaterally.  -No evidence of popliteal cyst, bilaterally.   Echo  10/20/2021:  1. Left ventricular ejection fraction, by estimation, is 60 to 65%. Left  ventricular ejection fraction by 3D volume is 64 %. The left ventricle has  normal function. The left ventricle has no regional wall  motion  abnormalities. There is mild left  ventricular hypertrophy. Left ventricular diastolic parameters are  consistent with Grade I diastolic dysfunction (impaired relaxation). The  average left ventricular global longitudinal strain is -16.5 %.   2. Right ventricular systolic function is normal. The right ventricular  size is normal. There is mildly elevated pulmonary artery systolic  pressure. The estimated right ventricular systolic pressure is 35.9 mmHg.   3. Left atrial size was mildly dilated.   4. The mitral valve is normal in structure. Trivial mitral valve  regurgitation. No evidence of mitral stenosis.   5. The aortic valve is tricuspid. Aortic valve regurgitation is not  visualized. No aortic stenosis is present.   6. The inferior vena cava is normal in size with greater than 50%  respiratory variability, suggesting right atrial  pressure of 3 mmHg.   Comparison(s): 06/14/21 EF 65-70%. PA pressure .   Monitor 08/2021: Basic rhythm is normal sinus Frequent SVT (480) with longest episode lasting 58 seconds at 123 bpm. Other shorter episodes with fastest 171 bpm Rare PVC's and < 4 beat ventricular salvos.   Recommendation: Increase metoprolol to 37.5 mg BID; Continue to measure BP and HR.   Patch Wear Time:  14 days and 0 hours (2022-12-05T17:33:09-498 to 2022-12-19T17:33:01-499)   Patient had a min HR of 56 bpm, max HR of 187 bpm, and avg HR of 73 bpm. Predominant underlying rhythm was Sinus Rhythm. 4 Ventricular Tachycardia runs occurred, the run with the fastest interval lasting 11 beats with a max rate of 187 bpm, the longest  lasting 12 beats with an avg rate of 129 bpm. 480 Supraventricular Tachycardia runs occurred, the run with the fastest interval lasting 9.4 secs with a max rate of 171 bpm, the longest lasting 57.8 secs with an avg rate of 123 bpm. Isolated SVEs were  occasional (2.0%, 16109), SVE Couplets were rare (<1.0%, 1072), and SVE Triplets were rare  (<1.0%, 309). Isolated VEs were rare (<1.0%, 456), VE Couplets were rare (<1.0%, 7), and VE Triplets were rare (<1.0%, 1). Ventricular Bigeminy was present.   EKG:  EKG is personally reviewed. 02/07/2023:  EKG was not ordered. 12/18/2022: NSR Rate 88bpm, low voltage, PAC.  Recent Labs: 07/24/2022: ALT 14; Hemoglobin 12.8; Platelets 222.0; TSH 0.90 03/15/2023: BUN 20; Creatinine, Ser 1.32; Potassium 4.1; Sodium 139   Recent Lipid Panel    Component Value Date/Time   CHOL 204 (H) 07/24/2022 1449   CHOL 193 07/20/2021 1524   TRIG 86.0 07/24/2022 1449   HDL 47.90 07/24/2022 1449   HDL 50 07/20/2021 1524   CHOLHDL 4 07/24/2022 1449   VLDL 17.2 07/24/2022 1449   LDLCALC 139 (H) 07/24/2022 1449   LDLCALC 121 (H) 07/20/2021 1524   LDLDIRECT 142.5 11/08/2008 1022     Risk Assessment/Calculations:     No BP recorded.  {Refresh Note OR Click here to enter BP  :1}***    Physical Exam:    VS:  There were no vitals taken for this visit. , BMI There is no height or weight on file to calculate BMI. GENERAL:  Well appearing HEENT: Pupils equal round and reactive, fundi not visualized, oral mucosa unremarkable NECK:  No jugular venous distention, waveform within normal limits, carotid upstroke brisk and symmetric, no bruits, no thyromegaly LUNGS:  Clear to auscultation bilaterally HEART:  RRR.  PMI not displaced or sustained,S1 and S2 within normal limits, no S3, no S4, no clicks, no rubs, no murmurs ABD:  Flat, positive bowel sounds normal in frequency in pitch, no bruits, no rebound, no guarding, no midline pulsatile mass, no hepatomegaly, no splenomegaly EXT:  2 plus pulses throughout, no edema, no cyanosis no clubbing SKIN:  No rashes no nodules NEURO:  Cranial nerves II through XII grossly intact, motor grossly intact throughout PSYCH:  Cognitively intact, oriented to person place and time   ASSESSMENT:    No diagnosis found.   PLAN:    No problem-specific Assessment & Plan notes  found for this encounter.   Disposition: FU with Alta Goding C. Duke Salvia, MD, [facility] in 6 weeks.  Medication Adjustments/Labs and Tests Ordered: Current medicines are reviewed at length with the patient today.  Concerns regarding medicines are outlined above.   No orders of the defined types were placed in this encounter.  No orders of the defined  types were placed in this encounter.  I,Mathew Stumpf,acting as a Neurosurgeon for Chilton Si, MD.,have documented all relevant documentation on the behalf of Chilton Si, MD,as directed by  Chilton Si, MD while in the presence of Chilton Si, MD.  I, Burnette Sautter C. Duke Salvia, MD have reviewed all documentation for this visit.  The documentation of the exam, diagnosis, procedures, and orders on 03/20/2023 are all accurate and complete.   Signed, Chilton Si, MD 03/20/2023  8:01 AM [facility]

## 2023-03-20 NOTE — Telephone Encounter (Signed)
Pt states that there is information missing out of the packet she received today at her appointment. Please advise.

## 2023-03-20 NOTE — Progress Notes (Signed)
Cardiology Office Note:    Date:  03/20/2023   ID:  Shannon Jensen, DOB [birth year], MRN [id number]  PCP:  Myrlene Broker, MD   [facility] Providers Cardiologist:  Chilton Si, MD     Referring MD: Myrlene Broker, *   CC:  Hypertension  History of Present Illness:    Shannon Jensen is a 89 y.o. female with a hx of hypertension, hyperlipidemia, HFpEF, breast cancer s/p lumpectomy and Tamoxifen, and pulmonary embolism here for follow-up.   She has a history of PE 06/2021 tamoxifen was discontinued due to concern that it may have caused her PE. ECHO at the time revealed LVEF 65-70%, LVH and mild grade 1 diastolic dysfunction. PASP was . Pulmonary pressures normalized on repeat echo. She wore a monitor 08/2021 with frequent episodes of SVT lasting up to one minute, and rare PVCs. Metoprolol was increased.  At her appointment 12/2022 she reported orthopedic concerns and occasional atypical chest pain.  Blood pressure was uncontrolled.  She was encouraged to increase her exercise and olmesartan was added.  At her visit 01/2023 blood pressure remained controlled.  Blood pressure in the office was 156/75 initially and 172/80 on repeat.  Her home blood pressure was running in the 110s to 140s.  She was switched from olmesartan and amlodipine to Tribenzor.  Creatinine increased from 0.94-1.3.  Today, she is accompanied by a family member. She complains of chronic pain that has been gradually worsening for years, and affirms diagnoses of arthritis and spinal stenosis. Her pain has been worse particularly in her knees, hips, and back. Also she notes that she has torn cartilage in her right leg and is scheduled to see a specialist regarding surgery. Her blood pressure is 147/74 in the office today, and 128/64 on manual recheck. She doesn't check her blood pressure at home, as the cuff is bothersome on her arms. This morning she has not yet taken her antihypertensives.  Usually she does well with monitoring her sodium intake and tries to drink more water than anything else. Lately she has been suffering from more headaches and associated eye pain, as well as some lightheadedness and fatigue. Additionally she complains of pruritus localized to her inner elbows bilaterally. This occurs intermittently every day, and has been an issue since the IV procedures performed during her blood clot. Also she has been struggling with obtaining adequate sleep. She denies any palpitations, chest pain, shortness of breath, peripheral edema, syncope, orthopnea, or PND.  Past Medical History:  Diagnosis Date   Anxiety and depression    Arthritis    Breast cancer (HCC) 08/2008   Right Breast Cancer   Breast cancer (HCC) 2019   Left Breast Cancer   Chronic headaches    Cystocele    Diverticulosis    Family history of breast cancer    Family history of colon cancer    Family history of pancreatic cancer    Family history of prostate cancer    GERD (gastroesophageal reflux disease)    H/O vitamin D deficiency    HLD (hyperlipidemia)    Hypertension    Osteoarthritis    Osteoporosis    Pelvic relaxation    Personal history of radiation therapy 2009   Right Breast Cancer   Pre-diabetes 12/18/2022   Pulmonary embolism (HCC) 06/2021   Urinary tract infection 2014    Past Surgical History:  Procedure Laterality Date   ABDOMINAL HYSTERECTOMY  1998   APPENDECTOMY  1970   BREAST  LUMPECTOMY Right 2009   BREAST LUMPECTOMY Left 2019   BREAST LUMPECTOMY WITH RADIOACTIVE SEED LOCALIZATION Left 11/19/2017   Procedure: BREAST LUMPECTOMY WITH RADIOACTIVE SEED LOCALIZATION;  Surgeon: Emelia Loron, MD;  Location: [facility] OR;  Service: General;  Laterality: Left;   BREAST SURGERY  2009   cancer-lymph node removal   NECK SURGERY     cyst removal    Current Medications: Current Meds  Medication Sig   acetaminophen (TYLENOL) 325 MG tablet Take 650 mg by mouth every 6 (six) hours  as needed for moderate pain or headache.    Blood Pressure Monitoring (OMRON 5 SERIES BP MONITOR) DEVI 1 Units by Does not apply route daily.   Cholecalciferol (VITAMIN D) 2000 units CAPS Take 2,000 Units by mouth daily.   metoprolol succinate (TOPROL-XL) 25 MG 24 hr tablet TAKE 1 TABLET (25 MG TOTAL) BY MOUTH DAILY.   Multiple Vitamins-Minerals (WOMENS ONE DAILY) TABS Take 1 tablet by mouth daily.     Olmesartan-amLODIPine-HCTZ (TRIBENZOR) 20-5-12.5 MG TABS Take 1 tablet by mouth daily.   Omega-3 Fatty Acids (FISH OIL) 1200 MG CAPS Take 1,200 mg by mouth daily.    pantoprazole (PROTONIX) 40 MG tablet Take 40 mg by mouth daily.     Allergies:   Celecoxib, Erythromycin, Nabumetone, Nsaids, Oxycodone-acetaminophen, Tyloxapol, and Vioxx [rofecoxib]   Social History   Socioeconomic History   Marital status: Widowed    Spouse name: Not on file   Number of children: 4   Years of education: 21   Highest education level: 12th grade  Occupational History   Occupation: retired    Associate Professor: RETIRED  Tobacco Use   Smoking status: Never    Passive exposure: Never   Smokeless tobacco: Never  Vaping Use   Vaping Use: Never used  Substance and Sexual Activity   Alcohol use: No   Drug use: No   Sexual activity: Never  Other Topics Concern   Not on file  Social History Narrative   Not on file   Social Determinants of Health   Financial Resource Strain: Low Risk  (06/29/2021)   Overall Financial Resource Strain (CARDIA)    Difficulty of Paying Living Expenses: Not hard at all  Food Insecurity: No Food Insecurity (06/29/2021)   Hunger Vital Sign    Worried About Running Out of Food in the Last Year: Never true    Ran Out of Food in the Last Year: Never true  Transportation Needs: No Transportation Needs (08/09/2021)   PRAPARE - Administrator, Civil Service (Medical): No    Lack of Transportation (Non-Medical): No  Physical Activity: Inactive (06/29/2021)   Exercise Vital  Sign    Days of Exercise per Week: 0 days    Minutes of Exercise per Session: 0 min  Stress: No Stress Concern Present (06/29/2021)   [employer] of Occupational Health - Occupational Stress Questionnaire    Feeling of Stress : Not at all  Social Connections: Moderately Isolated (06/29/2021)   Social Connection and Isolation Panel [NHANES]    Frequency of Communication with Friends and Family: More than three times a week    Frequency of Social Gatherings with Friends and Family: More than three times a week    Attends Religious Services: More than 4 times per year    Active Member of [employer] or Organizations: No    Attends Banker Meetings: Never    Marital Status: Widowed     Family History: The patient's family history includes  Appendicitis in her daughter; Breast cancer in her sister; Colon cancer in her mother; Colon polyps in her daughter; Heart disease in her paternal uncle; Pancreatic cancer in her brother; Pancreatic cancer (age of onset: 81) in her sister; Prostate cancer in her brother and nephew; Prostate cancer (age of onset: 11) in her father; Stroke in her daughter.  ROS:  Please see the history of present illness.  All other systems reviewed and are negative. (+) Back pain (+) Arthralgias (+) Headaches (+) Lightheadedness (+) Fatigue (+) Pruritus of elbows  EKGs/Labs/Other Studies Reviewed:    The following studies were reviewed today:  LE Venous Doppler  03/21/2022: Summary:  BILATERAL:  - No evidence of deep vein thrombosis seen in the lower extremities,  bilaterally.  -No evidence of popliteal cyst, bilaterally.   Echo  10/20/2021:  1. Left ventricular ejection fraction, by estimation, is 60 to 65%. Left  ventricular ejection fraction by 3D volume is 64 %. The left ventricle has  normal function. The left ventricle has no regional wall motion  abnormalities. There is mild left  ventricular hypertrophy. Left ventricular diastolic parameters  are  consistent with Grade I diastolic dysfunction (impaired relaxation). The  average left ventricular global longitudinal strain is -16.5 %.   2. Right ventricular systolic function is normal. The right ventricular  size is normal. There is mildly elevated pulmonary artery systolic  pressure. The estimated right ventricular systolic pressure is 35.9 mmHg.   3. Left atrial size was mildly dilated.   4. The mitral valve is normal in structure. Trivial mitral valve  regurgitation. No evidence of mitral stenosis.   5. The aortic valve is tricuspid. Aortic valve regurgitation is not  visualized. No aortic stenosis is present.   6. The inferior vena cava is normal in size with greater than 50%  respiratory variability, suggesting right atrial pressure of 3 mmHg.   Comparison(s): 06/14/21 EF 65-70%. PA pressure .   Monitor 08/2021: Basic rhythm is normal sinus Frequent SVT (480) with longest episode lasting 58 seconds at 123 bpm. Other shorter episodes with fastest 171 bpm Rare PVC's and < 4 beat ventricular salvos.   Recommendation: Increase metoprolol to 37.5 mg BID; Continue to measure BP and HR.   Patch Wear Time:  14 days and 0 hours (2022-12-05T17:33:09-498 to 2022-12-19T17:33:01-499)   Patient had a min HR of 56 bpm, max HR of 187 bpm, and avg HR of 73 bpm. Predominant underlying rhythm was Sinus Rhythm. 4 Ventricular Tachycardia runs occurred, the run with the fastest interval lasting 11 beats with a max rate of 187 bpm, the longest  lasting 12 beats with an avg rate of 129 bpm. 480 Supraventricular Tachycardia runs occurred, the run with the fastest interval lasting 9.4 secs with a max rate of 171 bpm, the longest lasting 57.8 secs with an avg rate of 123 bpm. Isolated SVEs were  occasional (2.0%, 16109), SVE Couplets were rare (<1.0%, 1072), and SVE Triplets were rare (<1.0%, 309). Isolated VEs were rare (<1.0%, 456), VE Couplets were rare (<1.0%, 7), and VE Triplets were rare  (<1.0%, 1). Ventricular Bigeminy was present.   EKG:  EKG is personally reviewed. 02/07/2023:  EKG was not ordered. 12/18/2022: NSR Rate 88bpm, low voltage, PAC.  Recent Labs: 07/24/2022: ALT 14; Hemoglobin 12.8; Platelets 222.0; TSH 0.90 03/15/2023: BUN 20; Creatinine, Ser 1.32; Potassium 4.1; Sodium 139   Recent Lipid Panel    Component Value Date/Time   CHOL 204 (H) 07/24/2022 1449   CHOL 193 07/20/2021  1524   TRIG 86.0 07/24/2022 1449   HDL 47.90 07/24/2022 1449   HDL 50 07/20/2021 1524   CHOLHDL 4 07/24/2022 1449   VLDL 17.2 07/24/2022 1449   LDLCALC 139 (H) 07/24/2022 1449   LDLCALC 121 (H) 07/20/2021 1524   LDLDIRECT 142.5 11/08/2008 1022     Risk Assessment/Calculations:          Physical Exam:    VS:  BP 128/64 (BP Location: Right Arm, Patient Position: Sitting, Cuff Size: Large)   Pulse 78   Ht 5\' 2"  (1.575 m)   Wt 146 lb 14.4 oz (66.6 kg)   SpO2 100%   BMI 26.87 kg/m  , BMI Body mass index is 26.87 kg/m. GENERAL:  Well appearing HEENT: Pupils equal round and reactive, fundi not visualized, oral mucosa unremarkable NECK:  No jugular venous distention, waveform within normal limits, carotid upstroke brisk and symmetric, no bruits, no thyromegaly LUNGS:  Clear to auscultation bilaterally HEART:  RRR.  PMI not displaced or sustained,S1 and S2 within normal limits, no S3, no S4, no clicks, no rubs, no murmurs ABD:  Flat, positive bowel sounds normal in frequency in pitch, no bruits, no rebound, no guarding, no midline pulsatile mass, no hepatomegaly, no splenomegaly EXT:  2 plus pulses throughout, no edema, no cyanosis no clubbing SKIN:  No rashes no nodules NEURO:  Cranial nerves II through XII grossly intact, motor grossly intact throughout PSYCH:  Cognitively intact, oriented to person place and time  ASSESSMENT:    1. Essential hypertension   2. SVT (supraventricular tachycardia)   3. Pulmonary embolism, other, unspecified chronicity, unspecified whether  acute cor pulmonale present (HCC)      Assessment and Plan    # Hypertension: Blood pressure was initially elevated and better on repeat.  Patient reports not taking blood pressure regularly due to discomfort. Patient has been taking amlodipine and olmesartan, with recent addition of hydrochlorothiazide. No dizziness reported. -Continue amlodipine, olmesartan, and hydrochlorothiazide as prescribed. -Check blood pressure regularly, especially during episodes of dizziness. -Recheck BMP in 2 months.  Encouraged her to stay hydrated  # Hypercholesterolemia: Elevated cholesterol levels noted, but no significant plaque in heart arteries. Patient has not been on cholesterol medication.  Given her age and lack of CV disease, this is reasonable. -Continue diet modifications to manage cholesterol levels (limit fried foods, red meat, cheese, eggs). -Recheck cholesterol levels in two months.  # SVT: Stable on metoprolol   # Arthritis: Continue acetaminophen.  No ischemic evaluation needed if she requires surgery.  No coronary calcification on CT last year.  Systolic function normal on echo.   General Health Maintenance / Follow-up Plans: -Return for follow-up visit in six months. -Complete blood work in two months.      Time spent: 42 minutes-Greater than 50% of this time was spent in counseling, explanation of diagnosis, planning of further management, and coordination of care.   Disposition:   FU with Omid Deardorff C. Duke Salvia, MD, [facility] in 6 months.  Medication Adjustments/Labs and Tests Ordered: Current medicines are reviewed at length with the patient today.  Concerns regarding medicines are outlined above.   Orders Placed This Encounter  Procedures   Basic metabolic panel   No orders of the defined types were placed in this encounter.  I,Mathew Stumpf,acting as a Neurosurgeon for Chilton Si, MD.,have documented all relevant documentation on the behalf of Chilton Si, MD,as directed by   Chilton Si, MD while in the presence of Chilton Si, MD.  I, Elmarie Shiley  Sherryl Manges, MD have reviewed all documentation for this visit.  The documentation of the exam, diagnosis, procedures, and orders on 03/20/2023 are all accurate and complete.  Signed, Chilton Si, MD 03/20/2023  5:43 PM [facility]

## 2023-03-20 NOTE — Patient Instructions (Signed)
Medication Instructions:  Your physician recommends that you continue on your current medications as directed. Please refer to the Current Medication list given to you today.   *If you need a refill on your cardiac medications before your next appointment, please call your pharmacy*  Lab Work: BMET IN 2 MONTHS   If you have labs (blood work) drawn today and your tests are completely normal, you will receive your results only by: MyChart Message (if you have MyChart) OR A paper copy in the mail If you have any lab test that is abnormal or we need to change your treatment, we will call you to review the results.  Testing/Procedures: NONE  Follow-Up: At [facility], you and your health needs are our priority.  As part of our continuing mission to provide you with exceptional heart care, we have created designated Provider Care Teams.  These Care Teams include your primary Cardiologist (physician) and Advanced Practice Providers (APPs -  Physician Assistants and Nurse Practitioners) who all work together to provide you with the care you need, when you need it.  We recommend signing up for the patient portal called "MyChart".  Sign up information is provided on this After Visit Summary.  MyChart is used to connect with patients for Virtual Visits (Telemedicine).  Patients are able to view lab/test results, encounter notes, upcoming appointments, etc.  Non-urgent messages can be sent to your provider as well.   To learn more about what you can do with MyChart, go to [email or web address].    Your next appointment:   6-7 month(s)  Provider:   Chilton Si, MD

## 2023-03-21 ENCOUNTER — Inpatient Hospital Stay: Payer: Medicare Other | Admitting: Hematology and Oncology

## 2023-03-22 ENCOUNTER — Telehealth (HOSPITAL_BASED_OUTPATIENT_CLINIC_OR_DEPARTMENT_OTHER): Payer: Self-pay

## 2023-03-22 NOTE — Telephone Encounter (Addendum)
Called patient, no answer, left message for patient to call back      ----- Message from Alver Sorrow sent at 03/22/2023  1:03 PM EDT ----- Kidney function decreased on previous.  Normal electrolytes.  Please ensure staying well-hydrated as this helps with kidney function.  Recommend repeat BMP in 1 week for monitoring.

## 2023-03-25 ENCOUNTER — Other Ambulatory Visit: Payer: Self-pay | Admitting: Internal Medicine

## 2023-03-25 NOTE — Telephone Encounter (Signed)
2nd call attempt, Results called to patient who verbalizes understanding! Results and repeat labs mailed to patient.    ----- Message from Alver Sorrow sent at 03/22/2023  1:03 PM EDT ----- Kidney function decreased on previous.  Normal electrolytes.  Please ensure staying well-hydrated as this helps with kidney function.  Recommend repeat BMP in 1 week for monitoring.

## 2023-03-26 ENCOUNTER — Other Ambulatory Visit: Payer: Self-pay | Admitting: Internal Medicine

## 2023-03-30 ENCOUNTER — Other Ambulatory Visit: Payer: Self-pay | Admitting: Internal Medicine

## 2023-04-02 ENCOUNTER — Other Ambulatory Visit: Payer: Self-pay | Admitting: Internal Medicine

## 2023-04-11 DIAGNOSIS — M1712 Unilateral primary osteoarthritis, left knee: Secondary | ICD-10-CM | POA: Diagnosis not present

## 2023-04-11 DIAGNOSIS — M1711 Unilateral primary osteoarthritis, right knee: Secondary | ICD-10-CM | POA: Diagnosis not present

## 2023-04-19 ENCOUNTER — Other Ambulatory Visit: Payer: Self-pay | Admitting: Internal Medicine

## 2023-04-22 ENCOUNTER — Telehealth: Payer: Self-pay | Admitting: Internal Medicine

## 2023-04-22 MED ORDER — PANTOPRAZOLE SODIUM 40 MG PO TBEC: 40.0000 mg | DELAYED_RELEASE_TABLET | Freq: Every day | ORAL | 0 refills | Status: DC

## 2023-04-22 NOTE — Telephone Encounter (Signed)
Sent refill to [facility].../lmb 

## 2023-04-22 NOTE — Telephone Encounter (Signed)
Patient called to make sure the request for a refill of  pantoprazole (PROTONIX) 40 MG tablet was received. She was informed that it was received after we closed on 04/19/2023 and has not been reviewed yet. She said she is going on vacation soon and will need it refilled by then. Best callback is [phone].

## 2023-04-25 DIAGNOSIS — M25561 Pain in right knee: Secondary | ICD-10-CM | POA: Diagnosis not present

## 2023-04-25 DIAGNOSIS — M1712 Unilateral primary osteoarthritis, left knee: Secondary | ICD-10-CM | POA: Diagnosis not present

## 2023-04-26 ENCOUNTER — Other Ambulatory Visit: Payer: Self-pay | Admitting: Cardiovascular Disease

## 2023-06-05 ENCOUNTER — Encounter: Payer: Self-pay | Admitting: Internal Medicine

## 2023-06-05 ENCOUNTER — Ambulatory Visit: Payer: Medicare Other | Admitting: Internal Medicine

## 2023-06-05 VITALS — BP 136/68 | HR 90 | Temp 97.9°F | Ht 62.0 in | Wt 137.0 lb

## 2023-06-05 DIAGNOSIS — R7303 Prediabetes: Secondary | ICD-10-CM

## 2023-06-05 DIAGNOSIS — I10 Essential (primary) hypertension: Secondary | ICD-10-CM | POA: Diagnosis not present

## 2023-06-05 DIAGNOSIS — E785 Hyperlipidemia, unspecified: Secondary | ICD-10-CM

## 2023-06-05 DIAGNOSIS — K219 Gastro-esophageal reflux disease without esophagitis: Secondary | ICD-10-CM

## 2023-06-05 DIAGNOSIS — Z23 Encounter for immunization: Secondary | ICD-10-CM | POA: Diagnosis not present

## 2023-06-05 LAB — CBC
HCT: 35.4 % — ABNORMAL LOW (ref 36.0–46.0)
Hemoglobin: 11.5 g/dL — ABNORMAL LOW (ref 12.0–15.0)
MCHC: 32.6 g/dL (ref 30.0–36.0)
MCV: 95.2 fl (ref 78.0–100.0)
Platelets: 346 10*3/uL (ref 150.0–400.0)
RBC: 3.72 Mil/uL — ABNORMAL LOW (ref 3.87–5.11)
RDW: 14.8 % (ref 11.5–15.5)
WBC: 8.6 10*3/uL (ref 4.0–10.5)

## 2023-06-05 LAB — LIPID PANEL
Cholesterol: 224 mg/dL — ABNORMAL HIGH (ref 0–200)
HDL: 41.1 mg/dL (ref >39.00)
LDL Cholesterol: 158 mg/dL — ABNORMAL HIGH (ref 0–99)
NonHDL: 182.89
Total CHOL/HDL Ratio: 5
Triglycerides: 125 mg/dL (ref 0.0–149.0)
VLDL: 25 mg/dL (ref 0.0–40.0)

## 2023-06-05 LAB — COMPREHENSIVE METABOLIC PANEL
ALT: 15 U/L (ref 0–35)
AST: 23 U/L (ref 0–37)
Albumin: 4 g/dL (ref 3.5–5.2)
Alkaline Phosphatase: 69 U/L (ref 39–117)
BUN: 16 mg/dL (ref 6–23)
CO2: 28 mEq/L (ref 19–32)
Calcium: 9.8 mg/dL (ref 8.4–10.5)
Chloride: 100 mEq/L (ref 96–112)
Creatinine, Ser: 1.23 mg/dL — ABNORMAL HIGH (ref 0.40–1.20)
GFR: 38.07 mL/min — ABNORMAL LOW (ref >60.00)
Glucose, Bld: 102 mg/dL — ABNORMAL HIGH (ref 70–99)
Potassium: 3.7 mEq/L (ref 3.5–5.1)
Sodium: 137 mEq/L (ref 135–145)
Total Bilirubin: 0.9 mg/dL (ref 0.2–1.2)
Total Protein: 7.2 g/dL (ref 6.0–8.3)

## 2023-06-05 MED ORDER — PANTOPRAZOLE SODIUM 40 MG PO TBEC: 40.0000 mg | DELAYED_RELEASE_TABLET | Freq: Every day | ORAL | 3 refills | Status: AC

## 2023-06-05 NOTE — Assessment & Plan Note (Signed)
BP at goal on metoprolol 25 mg daily and olmesartan/amlodipine/hydrochlorothiazide 20/5/12.5 mg daily. Continue same and checking CMP for repeat. Adjust as needed.

## 2023-06-05 NOTE — Progress Notes (Signed)
Subjective:   Patient ID: Shannon Jensen, female    DOB: [birth year], 89 y.o.   MRN: [id number]  HPI The patient is a 89 YO female coming in for follow up. Doing well overall.   Review of Systems  Constitutional: Negative.   HENT: Negative.    Eyes: Negative.   Respiratory:  Negative for cough, chest tightness and shortness of breath.   Cardiovascular:  Negative for chest pain, palpitations and leg swelling.  Gastrointestinal:  Negative for abdominal distention, abdominal pain, constipation, diarrhea, nausea and vomiting.  Musculoskeletal:  Positive for arthralgias.  Skin: Negative.   Neurological: Negative.   Psychiatric/Behavioral: Negative.      Objective:  Physical Exam Constitutional:      Appearance: She is well-developed.  HENT:     Head: Normocephalic and atraumatic.  Cardiovascular:     Rate and Rhythm: Normal rate and regular rhythm.  Pulmonary:     Effort: Pulmonary effort is normal. No respiratory distress.     Breath sounds: Normal breath sounds. No wheezing or rales.  Abdominal:     General: Bowel sounds are normal. There is no distension.     Palpations: Abdomen is soft.     Tenderness: There is no abdominal tenderness. There is no rebound.  Musculoskeletal:        General: Tenderness present.     Cervical back: Normal range of motion.  Skin:    General: Skin is warm and dry.  Neurological:     Mental Status: She is alert and oriented to person, place, and time.     Coordination: Coordination normal.     Vitals:   06/05/23 0924 06/05/23 0928 06/05/23 0941  BP: (!) 158/80 (!) 156/78 136/68  Pulse: 90    Temp: 97.9 F (36.6 C)    TempSrc: Oral    SpO2: 94%    Weight: 137 lb (62.1 kg)    Height: 5\' 2"  (1.575 m)      Assessment & Plan:  Flu shot given at visit

## 2023-06-05 NOTE — Patient Instructions (Signed)
We will check the labs today.

## 2023-06-05 NOTE — Assessment & Plan Note (Signed)
Checking lipid panel and adjust as needed omega 3 FA.

## 2023-06-05 NOTE — Assessment & Plan Note (Signed)
Doing well with protonix 40 mg daily and will continue lifelong.

## 2023-06-05 NOTE — Assessment & Plan Note (Signed)
Recent HgA1c normal. Continue yearly monitoring.

## 2023-06-19 DIAGNOSIS — M5451 Vertebrogenic low back pain: Secondary | ICD-10-CM | POA: Diagnosis not present

## 2023-07-23 DIAGNOSIS — M5416 Radiculopathy, lumbar region: Secondary | ICD-10-CM | POA: Diagnosis not present

## 2023-07-24 DIAGNOSIS — Z853 Personal history of malignant neoplasm of breast: Secondary | ICD-10-CM | POA: Diagnosis not present

## 2023-07-24 DIAGNOSIS — Z170 Estrogen receptor positive status [ER+]: Secondary | ICD-10-CM | POA: Diagnosis not present

## 2023-08-04 DIAGNOSIS — M5416 Radiculopathy, lumbar region: Secondary | ICD-10-CM | POA: Diagnosis not present

## 2023-08-12 DIAGNOSIS — M5416 Radiculopathy, lumbar region: Secondary | ICD-10-CM | POA: Diagnosis not present

## 2023-08-14 DIAGNOSIS — M1711 Unilateral primary osteoarthritis, right knee: Secondary | ICD-10-CM | POA: Diagnosis not present

## 2023-08-21 DIAGNOSIS — M1711 Unilateral primary osteoarthritis, right knee: Secondary | ICD-10-CM | POA: Diagnosis not present

## 2023-08-28 DIAGNOSIS — M25562 Pain in left knee: Secondary | ICD-10-CM | POA: Diagnosis not present

## 2023-08-28 DIAGNOSIS — M170 Bilateral primary osteoarthritis of knee: Secondary | ICD-10-CM | POA: Diagnosis not present

## 2023-08-28 DIAGNOSIS — M1711 Unilateral primary osteoarthritis, right knee: Secondary | ICD-10-CM | POA: Diagnosis not present

## 2023-09-02 ENCOUNTER — Inpatient Hospital Stay: Payer: Medicare Other | Attending: Hematology and Oncology | Admitting: Hematology and Oncology

## 2023-09-02 VITALS — BP 148/60 | HR 82 | Temp 97.2°F | Resp 18 | Ht 62.0 in | Wt 139.1 lb

## 2023-09-02 DIAGNOSIS — Z79899 Other long term (current) drug therapy: Secondary | ICD-10-CM | POA: Insufficient documentation

## 2023-09-02 DIAGNOSIS — M5416 Radiculopathy, lumbar region: Secondary | ICD-10-CM | POA: Diagnosis not present

## 2023-09-02 DIAGNOSIS — M51362 Other intervertebral disc degeneration, lumbar region with discogenic back pain and lower extremity pain: Secondary | ICD-10-CM | POA: Diagnosis not present

## 2023-09-02 DIAGNOSIS — M169 Osteoarthritis of hip, unspecified: Secondary | ICD-10-CM | POA: Insufficient documentation

## 2023-09-02 DIAGNOSIS — Z853 Personal history of malignant neoplasm of breast: Secondary | ICD-10-CM | POA: Insufficient documentation

## 2023-09-02 DIAGNOSIS — M1909 Primary osteoarthritis, other specified site: Secondary | ICD-10-CM | POA: Diagnosis not present

## 2023-09-02 NOTE — Assessment & Plan Note (Signed)
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  Breast exam 09/02/2023: Benign, palpable nodularity at the site of surgery 2.  Mammogram 08/22/2023: Benign breast density category B   Bilateral lower extremity pain: July 2023: Ultrasound lower extremity: No DVT   Return to clinic in 1 yr for follow-up

## 2023-09-02 NOTE — Progress Notes (Signed)
Patient Care Team: Myrlene Broker, MD as PCP - General (Internal Medicine) Chilton Si, MD as PCP - Cardiology (Cardiology)  DIAGNOSIS:  Encounter Diagnosis  Name Primary?   HX: breast cancer Yes    SUMMARY OF ONCOLOGIC HISTORY: Oncology History  History of right breast cancer (Resolved)  05/24/2008 Surgery    Right breast lumpectomy revealing 0.9 cm grade 1 invasive ductal carcinoma with negative margins, ER positive, PR positive, HER-2 negative   06/29/2008 - 08/06/2008 Radiation Therapy    adjuvant radiation therapy   10/11/2008 - 10/08/2013 Anti-estrogen oral therapy    antiestrogen therapy with Femara then switched to tamoxifen due to osteoporosis completed 5 years of tamoxifen , patient declined extended adjuvant therapy.   HX: breast cancer  09/18/2017 Initial Diagnosis   Left breast biopsy 11 o'clock position: IDC grade 2, 0.4 cm by mammogram.  The biopsy had 0.5 cm of material.  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  The Hereditary Gene Panel offered by [facility] includes sequencing and/or deletion duplication testing of the following 47 genes: APC, ATM, AXIN2, BARD1, BMPR1A, BRCA1, BRCA2, BRIP1, CDH1, CDK4, CDKN2A (p14ARF), CDKN2A (p16INK4a), CHEK2, CTNNA1, DICER1, EPCAM (Deletion/duplication testing only), GREM1 (promoter region deletion/duplication testing only), KIT, MEN1, MLH1, MSH2, MSH3, MSH6, MUTYH, NBN, NF1, NHTL1, PALB2, PDGFRA, PMS2, POLD1, POLE, PTEN, RAD50, RAD51C, RAD51D, SDHB, SDHC, SDHD, SMAD4, SMARCA4. STK11, TP53, TSC1, TSC2, and VHL.  The following genes were evaluated for sequence changes only: SDHA and HOXB13  c.251G>A variant only. The report date is October 02, 2018.  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  The amended report date is March 20, 2021.     CHIEF COMPLIANT: Surveillance of breast cancer  HISTORY OF PRESENT ILLNESS:  History of Present Illness   The patient, a two-time breast cancer survivor, presents for a routine follow-up. The first occurrence was 15 years ago, and the second was 6 years ago. The patient has been in remission since the last occurrence and has been regularly attending follow-up appointments. The patient is also involved in a support network for individuals diagnosed with breast cancer, indicating a proactive approach to her health and well-being.  In addition to the history of breast cancer, the patient is suffering from severe arthritis, which has significantly impacted her mobility and quality of life. The arthritis is widespread, affecting the back, hip, and other parts of the body. Despite the pain and mobility issues, the patient maintains a positive outlook and continues to engage in activities to the best of her ability.         ALLERGIES:  is allergic to celecoxib, erythromycin, nabumetone, nsaids, oxycodone-acetaminophen, tyloxapol, and vioxx [rofecoxib].  MEDICATIONS:  Current Outpatient Medications  Medication Sig Dispense Refill   acetaminophen (TYLENOL) 325 MG tablet Take 650 mg by mouth every 6 (six) hours as needed for moderate pain or headache.      Blood Pressure Monitoring (OMRON 5 SERIES BP MONITOR) DEVI 1 Units by Does not apply route daily. 1 each 0   Cholecalciferol (VITAMIN D) 2000 units CAPS Take 2,000 Units by mouth daily.  metoprolol succinate (TOPROL-XL) 25 MG 24 hr tablet TAKE 1 TABLET (25 MG TOTAL) BY MOUTH DAILY. 90 tablet 2   Multiple Vitamins-Minerals (WOMENS ONE DAILY) TABS Take 1 tablet by mouth daily.       Olmesartan-amLODIPine-HCTZ (TRIBENZOR) 20-5-12.5 MG TABS Take 1 tablet by mouth  daily. 90 tablet 3   Omega-3 Fatty Acids (FISH OIL) 1200 MG CAPS Take 1,200 mg by mouth daily.      pantoprazole (PROTONIX) 40 MG tablet Take 1 tablet (40 mg total) by mouth daily. 90 tablet 3   No current facility-administered medications for this visit.    PHYSICAL EXAMINATION: ECOG PERFORMANCE STATUS: 1 - Symptomatic but completely ambulatory  Vitals:   09/02/23 1100  BP: (!) 148/60  Pulse: 82  Resp: 18  Temp: (!) 97.2 F (36.2 C)  SpO2: 100%   Filed Weights   09/02/23 1100  Weight: 139 lb 1.6 oz (63.1 kg)    Physical Exam   VITALS: BP- 120/70 right breast palpable nodularity at the site of the scar tissue.  Otherwise bilateral breasts are normal without any problems     (exam performed in the presence of a chaperone)  LABORATORY DATA:  I have reviewed the data as listed    Latest Ref Rng & Units 06/05/2023    9:59 AM 03/15/2023    1:22 PM 12/25/2022   11:23 AM  CMP  Glucose 70 - 99 mg/dL 629  95  95   BUN 6 - 23 mg/dL 16  20  17    Creatinine 0.40 - 1.20 mg/dL 5.28  4.13  2.44   Sodium 135 - 145 mEq/L 137  139  141   Potassium 3.5 - 5.1 mEq/L 3.7  4.1  4.3   Chloride 96 - 112 mEq/L 100  102  104   CO2 19 - 32 mEq/L 28  23  20    Calcium 8.4 - 10.5 mg/dL 9.8  9.9  01.0   Total Protein 6.0 - 8.3 g/dL 7.2     Total Bilirubin 0.2 - 1.2 mg/dL 0.9     Alkaline Phos 39 - 117 U/L 69     AST 0 - 37 U/L 23     ALT 0 - 35 U/L 15       Lab Results  Component Value Date   WBC 8.6 06/05/2023   HGB 11.5 (L) 06/05/2023   HCT 35.4 (L) 06/05/2023   MCV 95.2 06/05/2023   PLT 346.0 06/05/2023   NEUTROABS 5.5 06/12/2021    ASSESSMENT & PLAN:  HX: breast cancer 09/18/2017: Left breast biopsy 11 o'clock position: IDC grade 2 11/19/2017:Left lumpectomy: IDC grade 1, 0.6 cm, low-grade DCIS, margins negative, T1 be N0 stage I a, ER 100%, PR 70%, HER-2 negative, Ki-67 2%   (History of right breast cancer Right breast invasive ductal carcinoma T1b N0 M0 stage IA diagnosis in 2009  status post lumpectomy 05/24/2008 ER positive PR positive HER-2 negative status post adjuvant radiation and took 5 years of antiestrogen therapy with tamoxifen completed 10/08/2013 Patient was offered extended adjuvant therapy but she declined)    Current treatment: Tamoxifen 10 mg daily (patient refused radiation)  blood clots October 2022: Tamoxifen has been discontinued since then    Breast cancer surveillance: 1.  Breast exam 09/02/2023: Benign, palpable nodularity at the site of surgery 2.  Mammogram 08/22/2023: Benign breast density category B   Bilateral lower extremity pain: July 2023: Ultrasound lower extremity: No DVT   Return to clinic on an as-needed  basis ------------------------------------- Assessment and Plan    Breast Cancer 15 years from first diagnosis and 6 years from second diagnosis. No new complaints or concerns related to breast cancer. -Graduating from regular follow-up visits after today's visit.  Arthritis Significant arthritis causing mobility issues and requiring wheelchair use. -Continue current management.  General Health Maintenance -Continue participation in [facility] programs for support. -Continue to monitor overall health and report any new concerns.          No orders of the defined types were placed in this encounter.  The patient has a good understanding of the overall plan. she agrees with it. she will call with any problems that may develop before the next visit here. Total time spent: 30 mins including face to face time and time spent for planning, charting and co-ordination of care   Tamsen Meek, MD 09/02/23

## 2023-12-09 ENCOUNTER — Other Ambulatory Visit: Payer: Self-pay | Admitting: Hematology and Oncology

## 2023-12-09 DIAGNOSIS — Z1231 Encounter for screening mammogram for malignant neoplasm of breast: Secondary | ICD-10-CM

## 2023-12-10 ENCOUNTER — Telehealth: Payer: Self-pay | Admitting: Cardiovascular Disease

## 2023-12-10 ENCOUNTER — Other Ambulatory Visit: Payer: Self-pay | Admitting: Cardiovascular Disease

## 2023-12-10 MED ORDER — OLMESARTAN-AMLODIPINE-HCTZ 20-5-12.5 MG PO TABS: 1.0000 | ORAL_TABLET | Freq: Every day | ORAL | 0 refills | Status: DC

## 2023-12-10 NOTE — Telephone Encounter (Signed)
*  STAT* If patient is at the pharmacy, call can be transferred to refill team.   1. Which medications need to be refilled? (please list name of each medication and dose if known) Olmesartan-amLODIPine-HCTZ 20-5-12.5 MG TABS    2. Would you like to learn more about the convenience, safety, & potential cost savings by using the [facility]? No    3. Are you open to using the [facility] (Type [facility].  ). No   4. Which pharmacy/location (including street and city if local pharmacy) is medication to be sent to? [id number] - [place], [place] - [address] AT [address]    5. Do they need a 30 day or 90 day supply? 90 day   Pt will be out this week

## 2023-12-11 ENCOUNTER — Ambulatory Visit
Admission: RE | Admit: 2023-12-11 | Discharge: 2023-12-11 | Disposition: A | Discharge status: Home / Self Care | Source: Ambulatory Visit | Attending: Hematology and Oncology | Admitting: Hematology and Oncology

## 2023-12-11 DIAGNOSIS — Z1231 Encounter for screening mammogram for malignant neoplasm of breast: Secondary | ICD-10-CM

## 2023-12-11 DIAGNOSIS — M5416 Radiculopathy, lumbar region: Secondary | ICD-10-CM | POA: Diagnosis not present

## 2023-12-11 DIAGNOSIS — M51362 Other intervertebral disc degeneration, lumbar region with discogenic back pain and lower extremity pain: Secondary | ICD-10-CM | POA: Diagnosis not present

## 2023-12-17 DIAGNOSIS — M5416 Radiculopathy, lumbar region: Secondary | ICD-10-CM | POA: Diagnosis not present

## 2024-01-08 DIAGNOSIS — M791 Myalgia, unspecified site: Secondary | ICD-10-CM | POA: Diagnosis not present

## 2024-01-08 DIAGNOSIS — G5703 Lesion of sciatic nerve, bilateral lower limbs: Secondary | ICD-10-CM | POA: Diagnosis not present

## 2024-01-14 DIAGNOSIS — M25562 Pain in left knee: Secondary | ICD-10-CM | POA: Diagnosis not present

## 2024-01-14 DIAGNOSIS — M25561 Pain in right knee: Secondary | ICD-10-CM | POA: Diagnosis not present

## 2024-01-28 DIAGNOSIS — M791 Myalgia, unspecified site: Secondary | ICD-10-CM | POA: Diagnosis not present

## 2024-02-05 DIAGNOSIS — H60543 Acute eczematoid otitis externa, bilateral: Secondary | ICD-10-CM | POA: Diagnosis not present

## 2024-02-05 DIAGNOSIS — H6123 Impacted cerumen, bilateral: Secondary | ICD-10-CM | POA: Diagnosis not present

## 2024-02-18 ENCOUNTER — Telehealth: Payer: Self-pay | Admitting: Cardiovascular Disease

## 2024-02-18 MED ORDER — METOPROLOL SUCCINATE ER 25 MG PO TB24: 25.0000 mg | ORAL_TABLET | Freq: Every day | ORAL | 0 refills | Status: DC

## 2024-02-18 MED ORDER — OLMESARTAN-AMLODIPINE-HCTZ 20-5-12.5 MG PO TABS: 1.0000 | ORAL_TABLET | Freq: Every day | ORAL | 0 refills | Status: DC

## 2024-02-18 NOTE — Telephone Encounter (Signed)
 Pt's medications were sent to pt's pharmacy as requested. Confirmation received.

## 2024-02-18 NOTE — Telephone Encounter (Signed)
*  STAT* If patient is at the pharmacy, call can be transferred to refill team.   1. Which medications need to be refilled? (please list name of each medication and dose if known) metoprolol  succinate (TOPROL -XL) 25 MG 24 hr tablet  Olmesartan -amLODIPine -HCTZ 20-5-12.5 MG TABS  2. Which pharmacy/location (including street and city if local pharmacy) is medication to be sent to? [id number] [place], [place] - [address] AT [facility] [phone]   3. Do they need a 30 day or 90 day supply? 90

## 2024-02-25 DIAGNOSIS — G319 Degenerative disease of nervous system, unspecified: Secondary | ICD-10-CM | POA: Diagnosis not present

## 2024-02-25 DIAGNOSIS — H9193 Unspecified hearing loss, bilateral: Secondary | ICD-10-CM | POA: Diagnosis not present

## 2024-04-01 ENCOUNTER — Ambulatory Visit (INDEPENDENT_AMBULATORY_CARE_PROVIDER_SITE_OTHER): Admitting: Cardiovascular Disease

## 2024-04-01 ENCOUNTER — Encounter (HOSPITAL_BASED_OUTPATIENT_CLINIC_OR_DEPARTMENT_OTHER): Payer: Self-pay | Admitting: Cardiovascular Disease

## 2024-04-01 VITALS — BP 110/62 | HR 75 | Ht 63.0 in | Wt 133.0 lb

## 2024-04-01 DIAGNOSIS — E785 Hyperlipidemia, unspecified: Secondary | ICD-10-CM | POA: Diagnosis not present

## 2024-04-01 DIAGNOSIS — I10 Essential (primary) hypertension: Secondary | ICD-10-CM | POA: Diagnosis not present

## 2024-04-01 DIAGNOSIS — Z5181 Encounter for therapeutic drug level monitoring: Secondary | ICD-10-CM

## 2024-04-01 NOTE — Patient Instructions (Signed)
 Medication Instructions:  THE METOPROLOL  IS FOR YOUR HEART RATE/RHYTHM   THE OLMESARTAN -AMLODIPINE  IS FOR YOUR BLOOD PRESSURE    *If you need a refill on your cardiac medications before your next appointment, please call your pharmacy*  Lab Work: FASTING LP/CMET AT YOUR PRIMARY CARE (IF THEY ARE WILLING TO DO IT, IF NOT AT ANY [facility]   If you have labs (blood work) drawn today and your tests are completely normal, you will receive your results only by: MyChart Message (if you have MyChart) OR A paper copy in the mail If you have any lab test that is abnormal or we need to change your treatment, we will call you to review the results.  Testing/Procedures: NONE   Follow-Up: At [facility], you and your health needs are our priority.  As part of our continuing mission to provide you with exceptional heart care, our providers are all part of one team.  This team includes your primary Cardiologist (physician) and Advanced Practice Providers or APPs (Physician Assistants and Nurse Practitioners) who all work together to provide you with the care you need, when you need it.  Your next appointment:   12 month(s)  Provider:   Annabella Scarce, MD, Rosaline Bane, NP, or Reche Finder, NP    We recommend signing up for the patient portal called MyChart.  Sign up information is provided on this After Visit Summary.  MyChart is used to connect with patients for Virtual Visits (Telemedicine).  Patients are able to view lab/test results, encounter notes, upcoming appointments, etc.  Non-urgent messages can be sent to your provider as well.   To learn more about what you can do with MyChart, go to [email or web address].

## 2024-04-01 NOTE — Progress Notes (Signed)
 Cardiology Office Note:  .   Date:  04/01/2024  ID:  Shannon Jensen, DOB 06/13/1928, MRN [id number] PCP: Rollene Almarie LABOR, MD  [facility] Providers Cardiologist:  Annabella Scarce, MD    History of Present Illness: Shannon Jensen is a 89 y.o. female  with a hx of hypertension, hyperlipidemia, HFpEF, breast cancer s/p lumpectomy and Tamoxifen , and pulmonary embolism here for follow-up.   She has a history of PE 06/2021 tamoxifen  was discontinued due to concern that it may have caused her PE. ECHO at the time revealed LVEF 65-70%, LVH and mild grade 1 diastolic dysfunction. PASP was . Pulmonary pressures normalized on repeat echo. She wore a monitor 08/2021 with frequent episodes of SVT lasting up to one minute, and rare PVCs. Metoprolol  was increased.  At her appointment 12/2022 she reported orthopedic concerns and occasional atypical chest pain.  Blood pressure was uncontrolled.  She was encouraged to increase her exercise and olmesartan  was added.  At her visit 01/2023 blood pressure remained controlled.  Blood pressure in the office was 156/75 initially and 172/80 on repeat.  Her home blood pressure was running in the 110s to 140s.  She was switched from olmesartan  and amlodipine  to Tribenzor.  Creatinine increased from 0.94-1.3.  At her visit 03/2023 she noted chronic pain but was well from a cardiac standpoint.  BP was elevated initially but improved on repeat.    Discussed the use of AI scribe software for clinical note transcription with the patient, who gave verbal consent to proceed.  History of Present Illness Shannon Jensen manages her arthritis with exercises and treatments from an orthopedic specialist. She performs exercises at home, noting that they are not strenuous and she cannot get down on the floor, so she does them in bed. Her knee is described as 'bone against bone' and sometimes snaps if not supported. Spinal stenosis affects her mobility.  No shortness of  breath or chest pain while walking. Occasionally, she experiences a sensation at night that prompts her to take a deep breath, which she attributes to shallow breathing. No recent swelling in her legs.  She does not monitor her blood pressure at home due to difficulty with her fingers cramping. Her current medications for blood pressure include metoprolol  and a combination pill containing olmesartan , amlodipine , and hydrochlorothiazide. She has a history of fast heart rates and palpitations, for which she takes metoprolol .  ROS:  As per HPI  Studies Reviewed: .       Echo 10/2021: 1. Left ventricular ejection fraction, by estimation, is 60 to 65%. Left  ventricular ejection fraction by 3D volume is 64 %. The left ventricle has  normal function. The left ventricle has no regional wall motion  abnormalities. There is mild left  ventricular hypertrophy. Left ventricular diastolic parameters are  consistent with Grade I diastolic dysfunction (impaired relaxation). The  average left ventricular global longitudinal strain is -16.5 %.   2. Right ventricular systolic function is normal. The right ventricular  size is normal. There is mildly elevated pulmonary artery systolic  pressure. The estimated right ventricular systolic pressure is 35.9 mmHg.   3. Left atrial size was mildly dilated.   4. The mitral valve is normal in structure. Trivial mitral valve  regurgitation. No evidence of mitral stenosis.   5. The aortic valve is tricuspid. Aortic valve regurgitation is not  visualized. No aortic stenosis is present.   6. The inferior vena cava is normal in size with  greater than 50%  respiratory variability, suggesting right atrial pressure of 3 mmHg.   Risk Assessment/Calculations:            Physical Exam:   VS:  BP 110/62 (BP Location: Left Arm, Patient Position: Sitting, Cuff Size: Normal)   Pulse 75   Ht 5' 3 (1.6 m)   Wt 133 lb (60.3 kg)   SpO2 98%   BMI 23.56 kg/m  , BMI Body mass  index is 23.56 kg/m. GENERAL:  Well appearing HEENT: Pupils equal round and reactive, fundi not visualized, oral mucosa unremarkable NECK:  No jugular venous distention, waveform within normal limits, carotid upstroke brisk and symmetric, no bruits, no thyromegaly LUNGS:  Clear to auscultation bilaterally HEART:  RRR.  PMI not displaced or sustained,S1 and S2 within normal limits, no S3, no S4, no clicks, no rubs, no murmurs ABD:  Flat, positive bowel sounds normal in frequency in pitch, no bruits, no rebound, no guarding, no midline pulsatile mass, no hepatomegaly, no splenomegaly EXT:  2 plus pulses throughout, no edema, no cyanosis no clubbing SKIN:  No rashes no nodules NEURO:  Cranial nerves II through XII grossly intact, motor grossly intact throughout PSYCH:  Cognitively intact, oriented to person place and time   ASSESSMENT AND PLAN: .    Assessment & Plan # SVT:  Palpitations managed with metoprolol . No recent episodes reported. - Continue metoprolol  for heart rhythm management.  # Primary Hypertension Hypertension well-controlled with metoprolol  and olmesartan /amlodipine /hydrochlorothiazide. Blood pressure is 110/62 mmHg. Unable to monitor blood pressure at home due to physical limitations. - Continue current antihypertensive regimen. - Coordinate with primary care for routine lab work, including cholesterol check.  # Osteoarthritis Chronic osteoarthritis significantly impacts mobility, especially in the knees. Reports bone-on-bone sensation and occasional knee snapping, indicating advanced joint degeneration. - Continue current orthopedic management and home exercise regimen.  # Hyperlipidemia: Lipids have been elevated.  She is not on a statin given her age and lack of underlying coronary disease.  She will have lipids rechecked when she sees her primary in the next couple months.          Dispo: f/u 1 year  Signed, Annabella Scarce, MD

## 2024-04-21 ENCOUNTER — Ambulatory Visit: Admitting: Internal Medicine

## 2024-04-21 ENCOUNTER — Ambulatory Visit: Payer: Self-pay | Admitting: Internal Medicine

## 2024-04-21 ENCOUNTER — Encounter: Payer: Self-pay | Admitting: Internal Medicine

## 2024-04-21 ENCOUNTER — Ambulatory Visit (INDEPENDENT_AMBULATORY_CARE_PROVIDER_SITE_OTHER): Admitting: Internal Medicine

## 2024-04-21 ENCOUNTER — Telehealth: Payer: Self-pay

## 2024-04-21 VITALS — BP 146/70 | HR 89 | Temp 98.6°F | Ht 63.0 in | Wt 131.0 lb

## 2024-04-21 DIAGNOSIS — E049 Nontoxic goiter, unspecified: Secondary | ICD-10-CM

## 2024-04-21 DIAGNOSIS — I10 Essential (primary) hypertension: Secondary | ICD-10-CM | POA: Diagnosis not present

## 2024-04-21 DIAGNOSIS — N814 Uterovaginal prolapse, unspecified: Secondary | ICD-10-CM

## 2024-04-21 DIAGNOSIS — R7303 Prediabetes: Secondary | ICD-10-CM | POA: Diagnosis not present

## 2024-04-21 DIAGNOSIS — M810 Age-related osteoporosis without current pathological fracture: Secondary | ICD-10-CM | POA: Diagnosis not present

## 2024-04-21 DIAGNOSIS — Z0001 Encounter for general adult medical examination with abnormal findings: Secondary | ICD-10-CM

## 2024-04-21 DIAGNOSIS — Z8639 Personal history of other endocrine, nutritional and metabolic disease: Secondary | ICD-10-CM

## 2024-04-21 DIAGNOSIS — H9190 Unspecified hearing loss, unspecified ear: Secondary | ICD-10-CM | POA: Diagnosis not present

## 2024-04-21 DIAGNOSIS — I471 Supraventricular tachycardia, unspecified: Secondary | ICD-10-CM | POA: Diagnosis not present

## 2024-04-21 DIAGNOSIS — E785 Hyperlipidemia, unspecified: Secondary | ICD-10-CM

## 2024-04-21 DIAGNOSIS — Z0000 Encounter for general adult medical examination without abnormal findings: Secondary | ICD-10-CM | POA: Diagnosis not present

## 2024-04-21 LAB — CBC
HCT: 35.8 % — ABNORMAL LOW (ref 36.0–46.0)
Hemoglobin: 11.9 g/dL — ABNORMAL LOW (ref 12.0–15.0)
MCHC: 33.3 g/dL (ref 30.0–36.0)
MCV: 93.5 fl (ref 78.0–100.0)
Platelets: 209 K/uL (ref 150.0–400.0)
RBC: 3.83 Mil/uL — ABNORMAL LOW (ref 3.87–5.11)
RDW: 14.3 % (ref 11.5–15.5)
WBC: 7.6 K/uL (ref 4.0–10.5)

## 2024-04-21 LAB — COMPREHENSIVE METABOLIC PANEL WITH GFR
ALT: 9 U/L (ref 0–35)
AST: 18 U/L (ref 0–37)
Albumin: 4.2 g/dL (ref 3.5–5.2)
Alkaline Phosphatase: 55 U/L (ref 39–117)
BUN: 33 mg/dL — ABNORMAL HIGH (ref 6–23)
CO2: 28 meq/L (ref 19–32)
Calcium: 9.6 mg/dL (ref 8.4–10.5)
Chloride: 103 meq/L (ref 96–112)
Creatinine, Ser: 1.39 mg/dL — ABNORMAL HIGH (ref 0.40–1.20)
GFR: 32.67 mL/min — ABNORMAL LOW (ref >60.00)
Glucose, Bld: 90 mg/dL (ref 70–99)
Potassium: 3.8 meq/L (ref 3.5–5.1)
Sodium: 139 meq/L (ref 135–145)
Total Bilirubin: 0.6 mg/dL (ref 0.2–1.2)
Total Protein: 7 g/dL (ref 6.0–8.3)

## 2024-04-21 LAB — LIPID PANEL
Cholesterol: 204 mg/dL — ABNORMAL HIGH (ref 0–200)
HDL: 48.9 mg/dL (ref >39.00)
LDL Cholesterol: 135 mg/dL — ABNORMAL HIGH (ref 0–99)
NonHDL: 155.31
Total CHOL/HDL Ratio: 4
Triglycerides: 104 mg/dL (ref 0.0–149.0)
VLDL: 20.8 mg/dL (ref 0.0–40.0)

## 2024-04-21 LAB — VITAMIN D 25 HYDROXY (VIT D DEFICIENCY, FRACTURES): VITD: >120 ng/mL

## 2024-04-21 LAB — VITAMIN B12: Vitamin B-12: 1020 pg/mL — ABNORMAL HIGH (ref 211–911)

## 2024-04-21 LAB — TSH: TSH: 1.51 u[IU]/mL (ref 0.35–5.50)

## 2024-04-21 NOTE — Telephone Encounter (Signed)
 CRITICAL VALUE STICKER  CRITICAL VALUE: Vitamin D  >120  RECEIVER (on-site recipient of call): Mariamawit Depaoli  DATE & TIME NOTIFIED: 04/21/24 at 2:14  MESSENGER (representative from lab): Saa  MD NOTIFIED:  Dr.Crawford  TIME OF NOTIFICATION: 2:14  RESPONSE:

## 2024-04-21 NOTE — Progress Notes (Signed)
 Subjective:   Patient ID: Shannon Jensen, female    DOB: Sep 24, 1927, 89 y.o.   MRN: [id number]  The patient is here for physical. Pertinent topics discussed: Discussed the use of AI scribe software for clinical note transcription with the patient, who gave verbal consent to proceed.  History of Present Illness Shannon Jensen is a 89 year old female who presents for a wellness visit and evaluation of fatigue and indigestion.  She experiences fatigue, describing herself as 'real tired' and 'run down'. She is interested in checking her vitamin B12 levels and inquires about the possibility of receiving B12 injections if her levels are low. She is also curious about the role of other B vitamins in energy levels.  She experiences indigestion, particularly at night when lying down, which she describes as a pressure that goes away and is not long-lasting. She does not wish to take prescription medication for this and is considering over-the-counter options like Tums or Maalox. No new chest pains, tightness, or pressure in the chest. No new major stomach issues or diarrhea.  She has a history of a blood clot in her lungs and is concerned about signs of recurrence. She did not experience leg swelling previously. No new breathing problems.  She experiences constipation frequently and attributes it to her diet. She occasionally takes fiber supplements but not regularly. She is interested in dietary changes to improve bowel movements.  She has a history of gait instability and uses a cane. She sometimes loses balance, especially when stepping on the cane.  She has experienced weight loss over the years, dropping from a size 16 to a size 10 or 8. She eats two meals a day and is considering nutritional supplements to maintain her weight. She is interested in protein bars as a supplement.  She has a history of a hysterectomy and experiences bladder prolapse. She is considering seeing a gynecologist for  a check-up, although she is not currently experiencing any significant issues.  She has a history of hearing loss and is in the process of getting fitted for hearing aids.  Here for medicare wellness and physical, no new complaints. Please see A/P for status and treatment of chronic medical problems.   Diet: heart healthy Physical activity: sedentary Depression/mood screen: negative Hearing: intact to whispered voice, moderate loss bilaterally Visual acuity: grossly normal, performs annual eye exam  ADLs: capable Fall risk: low uses cane to help Home safety: good Cognitive evaluation: intact to orientation, naming, recall and repetition EOL planning: adv directives discussed  Flowsheet Row Office Visit from 11/30/2022 in [facility] [place]  PHQ-2 Total Score 0    Flowsheet Row Office Visit from 11/30/2022 in [facility]  PHQ-9 Total Score 0      07/24/2022    1:19 PM 08/31/2022   11:00 AM 11/30/2022   10:03 AM 06/05/2023    9:25 AM 06/05/2023    9:29 AM  Fall Risk  Falls in the past year? 0  0 0 0  Was there an injury with Fall? 0  0 0 0  Fall Risk Category Calculator 0  0 0 0  Fall Risk Category (Retired) Low       (RETIRED) Patient Fall Risk Level Low fall risk  High fall risk      Fall risk Follow up Falls evaluation completed   Falls evaluation completed Falls evaluation completed Falls evaluation completed     Data saved with  a previous flowsheet row definition    I have personally reviewed and have noted 1. The patient's medical and social history - reviewed today no changes 2. Their use of alcohol, tobacco or illicit drugs 3. Their current medications and supplements 4. The patient's functional ability including ADL's, fall risks, home safety risks and hearing or visual impairment. 5. Diet and physical activities 6. Evidence for depression or mood disorders 7. Care team reviewed and updated 8.  The  patient is not on an opioid pain medication.  Patient Care Team: Rollene Almarie LABOR, MD as PCP - General (Internal Medicine) Raford Riggs, MD as PCP - Cardiology (Cardiology) Past Medical History:  Diagnosis Date   Anxiety and depression    Arthritis    Breast cancer [facility]) 08/2008   Right Breast Cancer   Breast cancer (HCC) 2019   Left Breast Cancer   Chronic headaches    Cystocele    Diverticulosis    Family history of breast cancer    Family history of colon cancer    Family history of pancreatic cancer    Family history of prostate cancer    GERD (gastroesophageal reflux disease)    H/O vitamin D  deficiency    HLD (hyperlipidemia)    Hypertension    Osteoarthritis    Osteoporosis    Pelvic relaxation    Personal history of radiation therapy 2009   Right Breast Cancer   Pre-diabetes 12/18/2022   Pulmonary embolism (HCC) 06/2021   Urinary tract infection 2014   Past Surgical History:  Procedure Laterality Date   ABDOMINAL HYSTERECTOMY  1998   APPENDECTOMY  1970   BREAST LUMPECTOMY Right 2009   BREAST LUMPECTOMY Left 2019   BREAST LUMPECTOMY WITH RADIOACTIVE SEED LOCALIZATION Left 11/19/2017   Procedure: BREAST LUMPECTOMY WITH RADIOACTIVE SEED LOCALIZATION;  Surgeon: Ebbie Cough, MD;  Location: [facility] OR;  Service: General;  Laterality: Left;   BREAST SURGERY  2009   cancer-lymph node removal   NECK SURGERY     cyst removal   Family History  Problem Relation Age of Onset   Prostate cancer Father 3       d. 16   Heart disease Paternal Uncle    Colon cancer Mother        dx 69s-60s   Breast cancer Sister        dx >50   Pancreatic cancer Brother    Prostate cancer Brother    Colon polyps Daughter        x 2, son   Stroke Daughter        x 2   Appendicitis Daughter        appendectomy   Pancreatic cancer Sister 44       d. 79   Prostate cancer Nephew        dx.50s   PMH, FMH, social history reviewed and updated  Review of Systems   Constitutional:  Positive for fatigue.  HENT: Negative.    Eyes: Negative.   Respiratory:  Negative for cough, chest tightness and shortness of breath.   Cardiovascular:  Negative for chest pain, palpitations and leg swelling.  Gastrointestinal:  Negative for abdominal distention, abdominal pain, constipation, diarrhea, nausea and vomiting.  Musculoskeletal: Negative.   Skin: Negative.   Neurological: Negative.   Psychiatric/Behavioral: Negative.      Objective:  Physical Exam Constitutional:      Appearance: She is well-developed.  HENT:     Head: Normocephalic and atraumatic.  Cardiovascular:  Rate and Rhythm: Normal rate and regular rhythm.  Pulmonary:     Effort: Pulmonary effort is normal. No respiratory distress.     Breath sounds: Normal breath sounds. No wheezing or rales.  Abdominal:     General: Bowel sounds are normal. There is no distension.     Palpations: Abdomen is soft.     Tenderness: There is no abdominal tenderness. There is no rebound.  Musculoskeletal:     Cervical back: Normal range of motion.  Skin:    General: Skin is warm and dry.  Neurological:     Mental Status: She is alert and oriented to person, place, and time.     Coordination: Coordination normal.     Vitals:   04/21/24 1044  BP: (!) 146/70  Pulse: 89  Temp: 98.6 F (37 C)  SpO2: 99%  Weight: 131 lb (59.4 kg)  Height: 5' 3 (1.6 m)   Assessment & Plan:

## 2024-04-21 NOTE — Telephone Encounter (Signed)
Will address via result notes.

## 2024-04-21 NOTE — Patient Instructions (Addendum)
 For blood pressure we want the blood pressure under 150/90.   For constipation try metamucil or benefiber daily to help.   Shannon Jensen , Thank you for taking time to come for your Medicare Wellness Visit. I appreciate your ongoing commitment to your health goals. Please review the following plan we discussed and let me know if I can assist you in the future.   These are the goals we discussed:  Goals   None     This is a list of the screening recommended for you and due dates:  Health Maintenance  Topic Date Due   Pap Smear  05/18/2016   Zoster (Shingles) Vaccine (2 of 2) 10/23/2017   COVID-19 Vaccine (5 - 2024-25 season) 05/12/2023   Medicare Annual Wellness Visit  07/25/2023   Flu Shot  04/10/2024   Mammogram  12/10/2024   DTaP/Tdap/Td vaccine (2 - Td or Tdap) 04/13/2030   Pneumococcal Vaccine for age over 71  Completed   DEXA scan (bone density measurement)  Completed   Hepatitis B Vaccine  Aged Out   HPV Vaccine  Aged Out   Meningitis B Vaccine  Aged Out

## 2024-04-23 NOTE — Assessment & Plan Note (Signed)
 Referral to urogyn for assessment.

## 2024-04-23 NOTE — Assessment & Plan Note (Signed)
 Taking metoprolol  and no flares. Continue.

## 2024-04-23 NOTE — Assessment & Plan Note (Signed)
Checking lipid panel and adjust as needed. Not on meds.  

## 2024-04-23 NOTE — Assessment & Plan Note (Signed)
 Checking CMP and adjust as needed. BP at goal on olmesartan /amlodipine /hydrochlorothiazide.

## 2024-04-23 NOTE — Assessment & Plan Note (Signed)
Flu shot yearly. Pneumonia complete. Shingrix due at pharmacy. Tetanus up to date. Colonoscopy aged out. Mammogram aged out, pap smear aged out and dexa complete. Counseled about sun safety and mole surveillance. Counseled about the dangers of distracted driving. Given 10 year screening recommendations.

## 2024-04-23 NOTE — Assessment & Plan Note (Signed)
 Checking HgA1c and adjsut as needed.

## 2024-04-23 NOTE — Assessment & Plan Note (Signed)
 We discussed she does not want medication so we will no longer check DEXA.

## 2024-04-23 NOTE — Assessment & Plan Note (Signed)
 Checking vitamin D  and adjust as needed.

## 2024-04-23 NOTE — Assessment & Plan Note (Signed)
 Has aids.

## 2024-04-23 NOTE — Assessment & Plan Note (Signed)
 Checking TSH and adjust as needed. No clinical change in size.

## 2024-05-20 ENCOUNTER — Telehealth (HOSPITAL_BASED_OUTPATIENT_CLINIC_OR_DEPARTMENT_OTHER): Payer: Self-pay | Admitting: Cardiovascular Disease

## 2024-05-20 ENCOUNTER — Other Ambulatory Visit: Payer: Self-pay | Admitting: Cardiovascular Disease

## 2024-05-20 MED ORDER — METOPROLOL SUCCINATE ER 25 MG PO TB24: 25.0000 mg | ORAL_TABLET | Freq: Every day | ORAL | 3 refills | Status: AC

## 2024-05-20 NOTE — Telephone Encounter (Signed)
 Pt's medication was sent to pt's pharmacy as requested. Confirmation received.

## 2024-05-20 NOTE — Telephone Encounter (Signed)
*  STAT* If patient is at the pharmacy, call can be transferred to refill team.   1. Which medications need to be refilled? (please list name of each medication and dose if known)   metoprolol  succinate (TOPROL -XL) 25 MG 24 hr tablet   2. Would you like to learn more about the convenience, safety, & potential cost savings by using the [facility]?   3. Are you open to using the [facility] (Type [facility]. ).  4. Which pharmacy/location (including street and city if local pharmacy) is medication to be sent to?  [id number] - [place], [place] - [address] AT [address]   5. Do they need a 30 day or 90 day supply? 90 day  Patient stated she only has 3 tablets left.

## 2024-06-08 ENCOUNTER — Other Ambulatory Visit: Payer: Self-pay | Admitting: Cardiovascular Disease

## 2024-06-08 ENCOUNTER — Telehealth: Payer: Self-pay | Admitting: Cardiovascular Disease

## 2024-06-08 MED ORDER — OLMESARTAN-AMLODIPINE-HCTZ 20-5-12.5 MG PO TABS: 1.0000 | ORAL_TABLET | Freq: Every day | ORAL | 2 refills | Status: AC

## 2024-06-08 NOTE — Telephone Encounter (Signed)
 RX sent in

## 2024-06-08 NOTE — Telephone Encounter (Signed)
*  STAT* If patient is at the pharmacy, call can be transferred to refill team.   1. Which medications need to be refilled? (please list name of each medication and dose if known) Olmesartan -amLODIPine -HCTZ 20-5-12.5 MG TABS    2. Would you like to learn more about the convenience, safety, & potential cost savings by using the [facility]? no   3. Are you open to using the [facility] (Type [facility]. ). No    4. Which pharmacy/location (including street and city if local pharmacy) is medication to be sent to?  [id number] - [place], [place] - [address]      5. Do they need a 30 day or 90 day supply? 90 day supply   Pt only has two pills left

## 2025-04-26 ENCOUNTER — Ambulatory Visit

## 2025-04-26 ENCOUNTER — Encounter: Admitting: Internal Medicine

## 2025-04-27 ENCOUNTER — Encounter: Admitting: Internal Medicine

## 2025-04-27 ENCOUNTER — Ambulatory Visit

## 2025-06-15 ENCOUNTER — Encounter: Admitting: Internal Medicine

## 2025-06-15 ENCOUNTER — Ambulatory Visit
